# Patient Record
Sex: Male | Born: 1945 | Race: White | Hispanic: No | Marital: Single | Smoking: Former smoker
Health system: Southern US, Community
[De-identification: ages and names within clinical notes are randomized; demographics above are authoritative.]

## PROBLEM LIST (undated history)

## (undated) DIAGNOSIS — E119 Type 2 diabetes mellitus without complications: Secondary | ICD-10-CM

## (undated) DIAGNOSIS — F4310 Post-traumatic stress disorder, unspecified: Secondary | ICD-10-CM

## (undated) DIAGNOSIS — F209 Schizophrenia, unspecified: Secondary | ICD-10-CM

## (undated) DIAGNOSIS — J690 Pneumonitis due to inhalation of food and vomit: Secondary | ICD-10-CM

## (undated) DIAGNOSIS — F1010 Alcohol abuse, uncomplicated: Secondary | ICD-10-CM

## (undated) DIAGNOSIS — J449 Chronic obstructive pulmonary disease, unspecified: Secondary | ICD-10-CM

## (undated) DIAGNOSIS — N179 Acute kidney failure, unspecified: Secondary | ICD-10-CM

## (undated) DIAGNOSIS — F3181 Bipolar II disorder: Secondary | ICD-10-CM

## (undated) DIAGNOSIS — I4892 Unspecified atrial flutter: Secondary | ICD-10-CM

## (undated) DIAGNOSIS — E669 Obesity, unspecified: Secondary | ICD-10-CM

## (undated) DIAGNOSIS — E7800 Pure hypercholesterolemia, unspecified: Secondary | ICD-10-CM

## (undated) DIAGNOSIS — D649 Anemia, unspecified: Secondary | ICD-10-CM

## (undated) DIAGNOSIS — I10 Essential (primary) hypertension: Secondary | ICD-10-CM

## (undated) HISTORY — PX: OTHER SURGICAL HISTORY: SHX169

---

## 2001-01-19 ENCOUNTER — Encounter: Admission: RE | Admit: 2001-01-19 | Discharge: 2001-01-19 | Payer: Self-pay | Admitting: Internal Medicine

## 2001-01-24 ENCOUNTER — Ambulatory Visit (HOSPITAL_COMMUNITY): Admission: RE | Admit: 2001-01-24 | Discharge: 2001-01-24 | Payer: Self-pay | Admitting: *Deleted

## 2001-02-02 ENCOUNTER — Encounter: Admission: RE | Admit: 2001-02-02 | Discharge: 2001-02-02 | Payer: Self-pay | Admitting: Internal Medicine

## 2001-02-22 ENCOUNTER — Encounter: Admission: RE | Admit: 2001-02-22 | Discharge: 2001-02-22 | Payer: Self-pay | Admitting: Internal Medicine

## 2001-02-28 ENCOUNTER — Ambulatory Visit (HOSPITAL_COMMUNITY): Admission: RE | Admit: 2001-02-28 | Discharge: 2001-02-28 | Payer: Self-pay | Admitting: *Deleted

## 2001-03-26 ENCOUNTER — Encounter: Admission: RE | Admit: 2001-03-26 | Discharge: 2001-03-26 | Payer: Self-pay | Admitting: Internal Medicine

## 2001-04-01 ENCOUNTER — Ambulatory Visit (HOSPITAL_COMMUNITY): Admission: RE | Admit: 2001-04-01 | Discharge: 2001-04-01 | Payer: Self-pay

## 2006-12-29 ENCOUNTER — Emergency Department (HOSPITAL_COMMUNITY): Admission: EM | Admit: 2006-12-29 | Discharge: 2006-12-29 | Payer: Self-pay | Admitting: Family Medicine

## 2007-03-02 ENCOUNTER — Encounter: Admission: RE | Admit: 2007-03-02 | Discharge: 2007-03-02 | Payer: Self-pay | Admitting: Unknown Physician Specialty

## 2008-01-28 ENCOUNTER — Emergency Department (HOSPITAL_COMMUNITY): Admission: EM | Admit: 2008-01-28 | Discharge: 2008-01-28 | Payer: Self-pay | Admitting: Emergency Medicine

## 2011-10-27 ENCOUNTER — Emergency Department (HOSPITAL_COMMUNITY)
Admission: EM | Admit: 2011-10-27 | Discharge: 2011-11-05 | Disposition: A | Discharge status: Other Acute Inpatient Hospital | Payer: Self-pay | Attending: Emergency Medicine | Admitting: Emergency Medicine

## 2011-10-27 DIAGNOSIS — R4589 Other symptoms and signs involving emotional state: Secondary | ICD-10-CM | POA: Insufficient documentation

## 2011-10-27 DIAGNOSIS — IMO0002 Reserved for concepts with insufficient information to code with codable children: Secondary | ICD-10-CM | POA: Insufficient documentation

## 2011-10-27 DIAGNOSIS — F22 Delusional disorders: Secondary | ICD-10-CM | POA: Insufficient documentation

## 2011-10-27 DIAGNOSIS — L97509 Non-pressure chronic ulcer of other part of unspecified foot with unspecified severity: Secondary | ICD-10-CM | POA: Insufficient documentation

## 2011-10-27 DIAGNOSIS — R451 Restlessness and agitation: Secondary | ICD-10-CM

## 2011-10-27 DIAGNOSIS — E1169 Type 2 diabetes mellitus with other specified complication: Secondary | ICD-10-CM | POA: Insufficient documentation

## 2011-10-27 DIAGNOSIS — F29 Unspecified psychosis not due to a substance or known physiological condition: Secondary | ICD-10-CM | POA: Insufficient documentation

## 2011-10-27 LAB — CBC
Hemoglobin: 11.2 g/dL — ABNORMAL LOW (ref 13.0–17.0)
MCH: 33 pg (ref 26.0–34.0)
Platelets: 203 10*3/uL (ref 150–400)
RBC: 3.39 MIL/uL — ABNORMAL LOW (ref 4.22–5.81)

## 2011-10-27 LAB — COMPREHENSIVE METABOLIC PANEL
ALT: 19 U/L (ref 0–53)
AST: 20 U/L (ref 0–37)
Alkaline Phosphatase: 74 U/L (ref 39–117)
CO2: 24 mEq/L (ref 19–32)
Calcium: 9.4 mg/dL (ref 8.4–10.5)
GFR calc non Af Amer: >90 mL/min (ref >90)
Potassium: 4.3 mEq/L (ref 3.5–5.1)
Sodium: 139 mEq/L (ref 135–145)
Total Protein: 6.9 g/dL (ref 6.0–8.3)

## 2011-10-27 LAB — GLUCOSE, CAPILLARY: Glucose-Capillary: 125 mg/dL — ABNORMAL HIGH (ref 70–99)

## 2011-10-27 MED ORDER — ZIPRASIDONE MESYLATE 20 MG IM SOLR
10.0000 mg | Freq: Once | INTRAMUSCULAR | Status: AC
  Administered 2011-10-27: 10 mg via INTRAMUSCULAR
  Filled 2011-10-27: qty 20

## 2011-10-27 MED ORDER — ONDANSETRON HCL 4 MG PO TABS: 4.0000 mg | ORAL_TABLET | Freq: Three times a day (TID) | ORAL | Status: DC | PRN

## 2011-10-27 MED ORDER — IBUPROFEN 600 MG PO TABS: 600.0000 mg | ORAL_TABLET | Freq: Three times a day (TID) | ORAL | Status: DC | PRN

## 2011-10-27 MED ORDER — NICOTINE 21 MG/24HR TD PT24
21.0000 mg | MEDICATED_PATCH | Freq: Every day | TRANSDERMAL | Status: DC
  Filled 2011-10-27: qty 1

## 2011-10-27 MED ORDER — ZOLPIDEM TARTRATE 5 MG PO TABS
5.0000 mg | ORAL_TABLET | Freq: Every evening | ORAL | Status: DC | PRN
  Administered 2011-10-31: 5 mg via ORAL
  Filled 2011-10-27: qty 1

## 2011-10-27 MED ORDER — LORAZEPAM 1 MG PO TABS
1.0000 mg | ORAL_TABLET | Freq: Three times a day (TID) | ORAL | Status: DC | PRN
  Administered 2011-10-29 – 2011-10-31 (×5): 1 mg via ORAL
  Filled 2011-10-27 (×5): qty 1

## 2011-10-27 MED ORDER — ACETAMINOPHEN 325 MG PO TABS
650.0000 mg | ORAL_TABLET | ORAL | Status: DC | PRN
  Administered 2011-11-01: 650 mg via ORAL
  Filled 2011-10-27: qty 2

## 2011-10-27 MED ORDER — ALUM & MAG HYDROXIDE-SIMETH 200-200-20 MG/5ML PO SUSP: 30.0000 mL | ORAL | Status: DC | PRN

## 2011-10-27 NOTE — ED Notes (Signed)
belongings are with pt's son, they are bagged and will be taken back home. Not with the pt, not locked Pt's son left a contact number [name] Khokhar [phone] Daughter [name] [phone] Daughter in [place] Colder [phone]

## 2011-10-27 NOTE — ED Provider Notes (Addendum)
History     CSN: [id number]  Arrival date & time 10/27/11  1823   First MD Initiated Contact with Patient 10/27/11 1910      Chief Complaint  Patient presents with  . Medical Clearance  . Delusional    (Consider location/radiation/quality/duration/timing/severity/associated sxs/prior treatment) The history is provided by the patient. The history is limited by a developmental delay.   Patient here with agitated behavior and bizarre thoughts according to his IVC paperwork. Patient would not cooperate with the exam currently. According to the report, patient is having paranoid delusions. No reported suicidal or homicidal ideations. No medications given prior to arrival. No further history can be obtained No past medical history on file.  No past surgical history on file.  No family history on file.  History  Substance Use Topics  . Smoking status: Not on file  . Smokeless tobacco: Not on file  . Alcohol Use: Not on file      Review of Systems  Unable to perform ROS All other systems reviewed and are negative.    Allergies  Review of patient's allergies indicates no known allergies.  Home Medications  No current outpatient prescriptions on file.  There were no vitals taken for this visit.  Physical Exam  Nursing note and vitals reviewed. Constitutional: He appears well-developed and well-nourished.  Non-toxic appearance. No distress.  HENT:  Head: Normocephalic and atraumatic.  Eyes: Conjunctivae, EOM and lids are normal. Pupils are equal, round, and reactive to light.  Neck: Normal range of motion. Neck supple. No tracheal deviation present. No mass present.  Cardiovascular: Normal rate, regular rhythm and normal heart sounds.  Exam reveals no gallop.   No murmur heard. Pulmonary/Chest: Effort normal and breath sounds normal. No stridor. No respiratory distress. He has no decreased breath sounds. He has no wheezes. He has no rhonchi. He has no rales.  Abdominal:  Soft. Normal appearance and bowel sounds are normal. He exhibits no distension. There is no tenderness. There is no rebound and no CVA tenderness.  Musculoskeletal: Normal range of motion. He exhibits no edema and no tenderness.  Neurological: He is alert. He has normal strength. No cranial nerve deficit or sensory deficit. GCS eye subscore is 4. GCS verbal subscore is 5. GCS motor subscore is 6.  Skin: Skin is warm and dry. No abrasion and no rash noted.  Psychiatric: His affect is blunt. He is withdrawn. Thought content is paranoid and delusional.    ED Course  Procedures (including critical care time)   Labs Reviewed  CBC  COMPREHENSIVE METABOLIC PANEL  ETHANOL  URINE RAPID DRUG SCREEN (HOSP PERFORMED)   No results found.   No diagnosis found.    MDM  Patient given Geodon due to his agitation. He is now alert more cooperative. He is under IVC paperwork. Spoke with behavior health assessment team and they will see patient        Toy Baker, MD 10/27/11 2249  8:27 AM Pt with worsening psychosis, given geodon, rechecked and pt now more calm-pt had been cleared by psych, but in light of his agitation, he will need placement  Toy Baker, MD 11/04/11 [id number]

## 2011-10-27 NOTE — ED Notes (Signed)
Unable to obtain Hx from pt. [employer] to call pt's girlfriend to obtain Hx and meds.

## 2011-10-27 NOTE — ED Notes (Signed)
Pt picked up at his exgirlfriends house today by [employer]. Ex did not want him there. She reports pt is diabetic and has not taken his medication since yesterday. Also reports delusions. 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 Pt denies SI/HI. When asked about situation, pt sts he will not talk about it, points to officers and sts "they brought me here, they can tell you." Pt asked about medical Hx as [employer] cannot provide this and pt sts he is not talking anymore and puts his hand up at RN. No longer answering any questions. [employer] outside conference room.

## 2011-10-27 NOTE — ED Notes (Signed)
MD Allen at bedside.

## 2011-10-27 NOTE — ED Notes (Addendum)
Family at bedside. Pt reminded of a need for urine sample. Pt denies and refuse to give urine. MD aware of it.

## 2011-10-28 ENCOUNTER — Encounter (HOSPITAL_COMMUNITY): Payer: Self-pay | Admitting: *Deleted

## 2011-10-28 LAB — RAPID URINE DRUG SCREEN, HOSP PERFORMED
Barbiturates: NOT DETECTED
Benzodiazepines: NOT DETECTED
Cocaine: NOT DETECTED
Tetrahydrocannabinol: POSITIVE — AB

## 2011-10-28 MED ORDER — CITALOPRAM HYDROBROMIDE 20 MG PO TABS
20.0000 mg | ORAL_TABLET | Freq: Every day | ORAL | Status: DC
  Administered 2011-10-28 – 2011-11-05 (×8): 20 mg via ORAL
  Filled 2011-10-28 (×9): qty 1

## 2011-10-28 MED ORDER — ZIPRASIDONE MESYLATE 20 MG IM SOLR
10.0000 mg | Freq: Once | INTRAMUSCULAR | Status: AC
  Administered 2011-10-28: 10 mg via INTRAMUSCULAR
  Filled 2011-10-28: qty 20

## 2011-10-28 MED ORDER — HYDROCHLOROTHIAZIDE 25 MG PO TABS
25.0000 mg | ORAL_TABLET | Freq: Every day | ORAL | Status: DC
  Administered 2011-10-28 – 2011-11-05 (×7): 25 mg via ORAL
  Filled 2011-10-28 (×9): qty 1

## 2011-10-28 MED ORDER — SIMVASTATIN 20 MG PO TABS
20.0000 mg | ORAL_TABLET | Freq: Every day | ORAL | Status: DC
  Administered 2011-10-28 – 2011-11-04 (×7): 20 mg via ORAL
  Filled 2011-10-28 (×9): qty 1

## 2011-10-28 MED ORDER — METFORMIN HCL 500 MG PO TABS
1000.0000 mg | ORAL_TABLET | Freq: Two times a day (BID) | ORAL | Status: DC
  Administered 2011-10-28 – 2011-10-29 (×2): 1000 mg via ORAL
  Filled 2011-10-28 (×3): qty 2

## 2011-10-28 MED ORDER — LISINOPRIL 20 MG PO TABS
20.0000 mg | ORAL_TABLET | Freq: Every day | ORAL | Status: DC
  Administered 2011-10-28 – 2011-11-05 (×7): 20 mg via ORAL
  Filled 2011-10-28 (×9): qty 1

## 2011-10-28 MED ORDER — LISINOPRIL-HYDROCHLOROTHIAZIDE 20-25 MG PO TABS: 1.0000 | ORAL_TABLET | Freq: Every day | ORAL | Status: DC
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
## 2011-10-28 NOTE — ED Notes (Signed)
Pt's son called to check up on him he was advised by this recorder of status pt asleep resting quietly at this time

## 2011-10-28 NOTE — ED Provider Notes (Signed)
Pt seen and evaluated in psych ED.  He is under IVC, received Geodon last night. Telepsych has been obtained and recommends inpatient admission.  Ethelda Chick, MD 10/28/11 1051

## 2011-10-29 LAB — GLUCOSE, CAPILLARY: Glucose-Capillary: 54 mg/dL — ABNORMAL LOW (ref 70–99)

## 2011-10-29 MED ORDER — CETAPHIL MOISTURIZING EX LOTN
TOPICAL_LOTION | Freq: Every day | CUTANEOUS | Status: DC
  Filled 2011-10-29: qty 473

## 2011-10-29 MED ORDER — ZIPRASIDONE MESYLATE 20 MG IM SOLR
INTRAMUSCULAR | Status: AC
  Administered 2011-10-29: 10 mg via INTRAMUSCULAR
  Filled 2011-10-29: qty 20

## 2011-10-29 MED ORDER — METFORMIN HCL 500 MG PO TABS
500.0000 mg | ORAL_TABLET | Freq: Two times a day (BID) | ORAL | Status: DC
  Administered 2011-10-29 – 2011-11-05 (×12): 500 mg via ORAL
  Filled 2011-10-29 (×15): qty 1

## 2011-10-29 MED ORDER — GLUCOSE 40 % PO GEL
1.0000 | Freq: Once | ORAL | Status: DC
Start: 2011-10-29 — End: 2011-10-29
  Administered 2011-10-29: 37.5 g via ORAL

## 2011-10-29 MED ORDER — ZIPRASIDONE MESYLATE 20 MG IM SOLR
10.0000 mg | Freq: Once | INTRAMUSCULAR | Status: AC
  Administered 2011-10-29 (×2): 10 mg via INTRAMUSCULAR

## 2011-10-29 MED ORDER — GLUCOSE 40 % PO GEL
1.0000 | Freq: Once | ORAL | Status: DC
  Filled 2011-10-29: qty 1.65

## 2011-10-29 MED ORDER — CETAPHIL MOISTURIZING EX CREA
TOPICAL_CREAM | Freq: Every day | CUTANEOUS | Status: DC
  Administered 2011-10-29 – 2011-11-05 (×5): via TOPICAL
  Filled 2011-10-29 (×4): qty 1

## 2011-10-29 MED ORDER — LORAZEPAM 2 MG/ML IJ SOLN: 2.0000 mg | Freq: Once | INTRAMUSCULAR | Status: DC

## 2011-10-29 MED ORDER — ZIPRASIDONE MESYLATE 20 MG IM SOLR
10.0000 mg | Freq: Once | INTRAMUSCULAR | Status: AC
  Administered 2011-10-29: 10 mg via INTRAMUSCULAR
  Filled 2011-10-29: qty 20
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
## 2011-10-29 NOTE — ED Provider Notes (Addendum)
  Physical Exam  BP 166/89  Pulse 88  Temp(Src) 98.3 F (36.8 C) (Oral)  Resp 16  SpO2 98%  Physical Exam    ED Course  Procedures  MDM Patient is complaining about cracks in his foot. He was examined and he does have to try cracks on his left lateral foot. There is no sign of infection. He states he just wants to leave. He states that the doctor in a box told him that he can leave. [facility] psych consult was reviewed and it states that he needs inpatient treatment. We'll continue to try and find placement      [employer]. Rubin Payor, MD 10/29/11 [phone]  Patient has been somewhat aggitated and wants to leave. He has already been IVC. Will give geodon   [employer]. Rubin Payor, MD 10/29/11 1007  Patient has become more aggitated and threatening. He will be restrained, both physically and [employer]  R. Rubin Payor, MD 10/29/11 1018  Patient was found to be hypoglycemic. He was given glucose gel. I will adjust the Glucophage down.   Juliet Rude. Rubin Payor, MD 10/29/11 1349
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
## 2011-10-30 LAB — GLUCOSE, CAPILLARY: Glucose-Capillary: 106 mg/dL — ABNORMAL HIGH (ref 70–99)

## 2011-10-30 MED ORDER — ZIPRASIDONE HCL 20 MG PO CAPS
20.0000 mg | ORAL_CAPSULE | Freq: Every morning | ORAL | Status: DC
  Administered 2011-10-31 – 2011-11-02 (×3): 20 mg via ORAL
  Filled 2011-10-30 (×3): qty 1

## 2011-10-30 MED ORDER — ZIPRASIDONE MESYLATE 20 MG IM SOLR
10.0000 mg | Freq: Once | INTRAMUSCULAR | Status: AC
  Administered 2011-10-30: 10 mg via INTRAMUSCULAR

## 2011-10-30 MED ORDER — ZIPRASIDONE MESYLATE 20 MG IM SOLR
20.0000 mg | Freq: Once | INTRAMUSCULAR | Status: DC
  Filled 2011-10-30: qty 20
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
## 2011-10-30 NOTE — ED Notes (Signed)
Pt family expressed concerns about his feet being swollen red cracked with a few open areas and one black area; pt reports Cetaphil lotion ordered yesterday not helping. EDP notified; stated he'll come look at his feet.
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
## 2011-10-30 NOTE — ED Notes (Signed)
Pt eating his lunch; he declined having it warmed or a replacement ice cream. Pt is being nice and polite.
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
## 2011-10-31 ENCOUNTER — Encounter (HOSPITAL_COMMUNITY): Payer: Self-pay | Admitting: Emergency Medicine

## 2011-10-31 LAB — GLUCOSE, CAPILLARY: Glucose-Capillary: 118 mg/dL — ABNORMAL HIGH (ref 70–99)

## 2011-10-31 NOTE — Discharge Planning (Signed)
CSW spoke with patient's daughter in law Azaan Leask) regarding disposition. Vernona Rieger reports that patient is hesitant about going to [facility] and would prefer to go to [facility].  Explained to patient's daughter in law that his information has been sent to both locations and if he is offered a bed at [facility], he will go there, however, if [facility] has bed availability, he will go there first.  Expressed understanding of concern daughter in law has and explained that patient is unable to remain here, as this is an Emergency Department and used to provide short term stabilization, not long term treatment which is what patient is in need of. Patient's daughter in law appreciative of the information and reported she will share with her spouse as patient called him with concerns.  CSW will continue to pursue psych inpt facility for patient.  Ileene Hutchinson , MSW, LCSWA 10/31/2011 10:30 AM [phone]

## 2011-10-31 NOTE — ED Notes (Signed)
Pt is currently pending disposition from [facility] and [place].  Pt information has been sent to [facility]. Phone referral has been complete. Oncoming CSW/ACT staff will need to follow up with [facility] to confirm authorization number.

## 2011-10-31 NOTE — ED Notes (Addendum)
EDP looked at pt's feet, states it's a chronic issue and we're doing all we can here, pt said that his feet are fine; pt's right foot didn't look as red and swollen compared to yesterday. Pt refuses to keep feet elevated. Pt would probably benefit from being allowed to wear non restrictive slippers as opposed to restrictive hospital socks if the exception can be made.

## 2011-10-31 NOTE — B[facility] Counselor (Signed)
SW-Kerri  completing [facility] paperwork. Pt pending [facility] acceptance to their wait list.

## 2011-10-31 NOTE — Discharge Planning (Signed)
CSW spoke with Alejandro Mulling, patient transfer coordinator at [facility]. Okey Regal reports that patient is not in their system and may be a patient of the [facility].  CSW explained that family reported patient has recvd care at [facility], however, Okey Regal reported that information was not reflected in their system.  Patient's information has been sent to [facility] as well.  Ileene Hutchinson , MSW, LCSWA 10/31/2011 10:33 AM [phone]

## 2011-10-31 NOTE — ED Notes (Signed)
Son in to visit. Patient ambulated to bathroom without difficulty.

## 2011-10-31 NOTE — ED Notes (Signed)
SW tried to have pt sign paperwork to go to the [place] and he refused, started getting agitated again, talking about the veteran friends to him, about 2000 people coming here, about the [place] hospital doing shock treatment, pilling him up and locking him up, would you like it there? SW said she'll have MD sign paperwork; pt called his son to say we are moving him to the [place] in [place] as this is a stressor for him; pt obviously agitated; family called back to say that pt is willing to go to the [facility] because they were good to him there; consent is signed, Quy Lotts asked that SW be notified and call her; SW notified, stated his information is being sent everywhere possible and wherever a bed is found is where he will go for further treatment.

## 2011-10-31 NOTE — ED Provider Notes (Signed)
7:52 AM  Psychiatric evaluation is ongoing. Disposition is currently pending. Patient has no current complaints and remains stable at this time.  L foot examined at request of nurse.  Chronic diabetic foot ulcer.  Does not appear cellulitic. Normal pulses.  No warmth.     Will need ongoing daily wound care.    Evalise Abruzzese A. Patrica Duel, MD 10/31/11 [phone]

## 2011-10-31 NOTE — ED Notes (Addendum)
Pt with labile mood and delusional behavior, paranoid about staff and accusing them of wanting him to die, pt reported he had not eaten dinner so he was given a snack and something to drink. Pt back out of room approx 15 min later requesting more to eat reporting that he has to eat every two hours to avoid going into a diabetic coma. Pt advised that he would not be able to eat every two hours tonight. Pt then requested to call his wife to see if he can get the proper medications he needed because he was not getting them here. Pt advised that the phone times have ended and that he could call his wife in the am. Pt gets angry, is not accepting any response from any staff, threatens to call and report writer to his wife in the am. Pt continues to be advised that he is diabetic and he will not be able to eat every two hours as requested. Pt then back into room standing in door window looking out at staff. Pt later out of room and asked writer if he could use the bathroom, pt advised yes and that he did not have to ask to go to the bathroom that he could just go, pt asked writer in angry tone of voice "are you sure, I have to ask for everything else", pt then back into his room to stand in window of door looking out at staff.

## 2011-11-01 LAB — GLUCOSE, CAPILLARY
Glucose-Capillary: 97 mg/dL (ref 70–99)
Glucose-Capillary: 103 mg/dL — ABNORMAL HIGH (ref 70–99)

## 2011-11-01 NOTE — [facility] Counselor (Signed)
Pt referred [facility] and accepted to the waitlist per [facility] 11/01/11 @ 1105.  Assessment updated today 11/01/11.

## 2011-11-01 NOTE — ED Notes (Signed)
Pt behavior cooperative, demeanor pleasant thus far, continues with disorganized thought processes and paranoid behaviors.

## 2011-11-01 NOTE — ED Notes (Signed)
Pt has received his [facility] authorization number and [facility] has been notified. Authorization number: [id number] G for 11/01/11 to 11/07/11.

## 2011-11-01 NOTE — Discharge Planning (Signed)
CSW spoke with Joey with the [facility] who advised patient is not in their system. CSW called Vernona Rieger, patient's daughter in law, and advised her that the [facility] does not have a record of him being treated there, and she became upset, reporting that her spouse, patient's son and his sister remember vividly going to see patient at the [place] following a SI attempt. CSW asked if he was possibly at [facility] or [facility]. Patient's daughter advised she did not know.  CSW called Junious Dresser at [facility] who advised patient has not been seen at [facility]. Patient is on [facility] waiting list. Patient's mood has improved. Patient will be telepsyched to reassess disposition.  Ileene Hutchinson , MSW, LCSWA 11/01/2011 12:28 PM [phone]
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
## 2011-11-01 NOTE — ED Notes (Signed)
Up to the bathroom 

## 2011-11-01 NOTE — ED Notes (Signed)
Up to the desk on the phone-pt remains pleasent, cooperative, smiling

## 2011-11-01 NOTE — ED Notes (Signed)
Care assumed

## 2011-11-01 NOTE — ED Notes (Signed)
Pt up OOB to bathroom with supplies to take a shower, pt informed that he can not take a shower until the am, pt gets upset and starts to yell and curse and ask why. Pt advised of rules of unit and pt continues to remain upset, yell and be sarcastic.

## 2011-11-01 NOTE — ED Notes (Signed)
Up to the desk for coffee.  Pt pleasant, cooperative, smiling./joking

## 2011-11-02 LAB — GLUCOSE, CAPILLARY: Glucose-Capillary: 81 mg/dL (ref 70–99)

## 2011-11-02 NOTE — ED Notes (Signed)
Supplies given to take shower

## 2011-11-02 NOTE — ED Notes (Signed)
Care assumed

## 2011-11-02 NOTE — ED Provider Notes (Signed)
Filed Vitals:   11/02/11 0539  BP: 135/88  Pulse: 77  Temp: 98 F (36.7 C)  Resp: 18   Patient still awaiting final disposition. No acute issues at this time.  Cyndra Numbers, MD 11/02/11 [phone]

## 2011-11-02 NOTE — ED Notes (Signed)
Patient up at nursing station. When this writer asked pt if he needed help patient stated "Yes, yes I do." Pt points at RN, Velna Hatchet and states "she told me I've only been here a day. My bracelet states different. I've been here two weeks. Long enough to grow a beard." This Clinical research associate chuckled and stated he had been here longer than a day. Patient then states "It's not funny. I've been here two weeks. This is how I get treated at the [place]. Did you have fun talking to your daughter?" This Clinical research associate redirected patient back to his room.

## 2011-11-02 NOTE — ED Notes (Signed)
Spoke w/daughter-in-law on phone (have signed consent on file), upset over how patient spoke to her husband (patients son), wants to come see him tonite and asked about meds if any had changed, went over meds w/daughter-in-law. She is also worried about his foot which has a black spot on it. Informed her this nurse would look at his foot an get MD to check it today as well. Daughter-in-law thanked this nurse for talking to her and giving support.

## 2011-11-02 NOTE — ED Notes (Signed)
If any information or history is needed, contact Marcie Bal.  This is a friend in [place].  [phone]
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
## 2011-11-02 NOTE — BH Assessment (Signed)
This assessment/evaluation/note was completed by a Child psychotherapist of Social Work Warden/ranger and as Investment banker, corporate, I was immediately available for consultation/collaboration. I am in agreement with the contents and disposition reflected in the assessment/evaluation/note(s).   Ileene Hutchinson , MSW, LCSWA 11/02/2011 3:40 PM [phone]

## 2011-11-02 NOTE — ED Notes (Signed)
Pt up OOB and out of room attempting to take a shower, pt advised he has to take a shower in the am, pt then gets upset same as last night, and ask "well can I take a piss and a do #2" pt advised that yes he can and he does not have to ask, pt continues to be upset and saying "I have to ask everything else". Pt goes into bathroom and then back to room.

## 2011-11-02 NOTE — ED Notes (Signed)
Patient on phone w/son talking when he started to escalate and he became agitated, went into room w/phone, asked patient to come out of room and he handed this nurse the phone, asked if he were finished w/ the phone and he stated yes. Went into room and laid down on bed, asked if he needed anything and he stated no smiling.

## 2011-11-03 MED ORDER — ZIPRASIDONE MESYLATE 20 MG IM SOLR
INTRAMUSCULAR | Status: AC
  Filled 2011-11-03: qty 20

## 2011-11-03 MED ORDER — ZIPRASIDONE MESYLATE 20 MG IM SOLR
10.0000 mg | Freq: Once | INTRAMUSCULAR | Status: AC
  Administered 2011-11-03: 10 mg via INTRAMUSCULAR

## 2011-11-03 NOTE — ED Provider Notes (Signed)
Initially, the patient had no complaints and said he was doing well. After that, he started pursuing me asking me if I knew how long he had been there and why was he is still being kept in our psychiatric unit. He is on the waiting list at [facility].  Dione Booze, MD 11/03/11 [phone]

## 2011-11-03 NOTE — Discharge Planning (Signed)
CSW faxed patient's information to [facility] for possible disposition.  Ileene Hutchinson , MSW, LCSWA 11/03/2011 1:09 PM [phone]

## 2011-11-03 NOTE — BH Assessment (Signed)
This assessment/evaluation/note was completed by a Child psychotherapist of Social Work Warden/ranger and as Investment banker, corporate, I was immediately available for consultation/collaboration. I am in agreement with the contents and disposition reflected in the assessment/evaluation/note(s).   Ileene Hutchinson , MSW, LCSWA 11/03/2011 3:04 PM [phone]

## 2011-11-03 NOTE — ED Notes (Signed)
Came out to NS and asked to use phone, informed he had already made 2 calls and this would be his last call for today (total of 3 calls per day), looked at the phone and decided not to make the call, went back to room, came back to NS and asked how many times would he be able to go to the BR, he was informed he could go as many times as he needed to, shook his head and mumbled something to himself, when he came out of BR stated that he would be informing his lawyer of this, stated he wanted to talk to his doctor, his doc came in to see him this morning and asked him if he had any questions, he stated yes and the doc stated he would be back to see him, informed him if doc came back again today we would tell him (call to MD to notify him to come speak w/patient) no specific reason/concern , patient went back to his room and stated "see, I'm inside my door". Closed the door to his room and was seen thru the camera watching the NS thru his door window.

## 2011-11-03 NOTE — ED Notes (Signed)
NT asked patient if she could do CBG. Pt stared at NT with no verbal response. No CBG completed at this time.

## 2011-11-03 NOTE — ED Provider Notes (Signed)
Pt standing in hall, agitated. Requests clothes to leave. Appears that delusional thought processes continue, very little insight into current symptoms, is under ivc. Previous telepsych consult recommended inpt tx. As prior psych consult several days ago, and feel pt may benefit from re-eval, possible additional psychiatric medication while awaiting placement, repeat telepsych consult ordered. For agitation, reassured, inquired as to we could get him anything to make his stay more pleasant. Remains agitated, uncooperative. geodon im for symptom improvement. Signed out to Dr Anitra Lauth to follow up on telepsych consult once completed.   Suzi Roots, MD 11/03/11 [phone]

## 2011-11-03 NOTE — ED Notes (Signed)
Asked pt if he wanted his lunch; he responded "NO!"

## 2011-11-03 NOTE — ED Notes (Signed)
PATIENT REFUSING VITALS AT THIS TIME.

## 2011-11-03 NOTE — ED Notes (Signed)
PATIENT STILL REFUSING VITALS

## 2011-11-03 NOTE — ED Notes (Signed)
When pt asked if he would like a shower pt stated "No I'm not gonna be here much longer. You'll see. You know why I'm here."

## 2011-11-03 NOTE — ED Notes (Signed)
Patient up to restroom brushing teeth.

## 2011-11-03 NOTE — ED Notes (Signed)
Patient will not take any of his medications today, has not eaten breakfast or lunch, he is waiting for his attorney to arrive, states he is not going to take meds, CBG or eat until his attorney tells him it is ok to do so tried to encourage him several times to take meds or eat but to no avail
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
## 2011-11-03 NOTE — Discharge Planning (Signed)
CSW spoke with staff at [facility] who advised they did receive referral information on patient and it is being reviewed.  Ileene Hutchinson , MSW, LCSWA 11/03/2011 3:05 PM [phone]

## 2011-11-03 NOTE — ED Notes (Signed)
Patient refused breakfast tray and continues to stand at door and stare into nurses station.

## 2011-11-03 NOTE — ED Notes (Signed)
Pt refused CBG. Stated "I need to see my lawyer".

## 2011-11-03 NOTE — ED Notes (Signed)
Continues to sleep from injection earlier, respirations regular and non labored, patient is safe

## 2011-11-04 LAB — GLUCOSE, CAPILLARY: Glucose-Capillary: 73 mg/dL (ref 70–99)

## 2011-11-04 MED ORDER — ZIPRASIDONE MESYLATE 20 MG IM SOLR
20.0000 mg | Freq: Once | INTRAMUSCULAR | Status: AC
  Administered 2011-11-04: 20 mg via INTRAMUSCULAR

## 2011-11-04 MED ORDER — ZIPRASIDONE MESYLATE 20 MG IM SOLR
INTRAMUSCULAR | Status: AC
  Filled 2011-11-04: qty 20

## 2011-11-04 NOTE — ED Notes (Addendum)
Pt has been accepted to [facility] by Osker Mason. Pt's bed is being held for transport on 11/05/11. Pt's updated legal information has been faxed for review. Oncoming ACT staff to follow up with pt to ensure pt can be transported and contact pt's family to update them on disposition as well.   CSW has contaced Gretel Acre, pts daughter-in-law to explain the updated disposition.
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
## 2011-11-04 NOTE — ED Notes (Signed)
TC with Roseanne Reno @ [facility]. Confirmed pt is on wait list. Stated they would eventually need new legals.Confirmed that legals were updated 11/03/11.

## 2011-11-04 NOTE — ED Notes (Signed)
Is currently sleeping, voicing no complaints

## 2011-11-04 NOTE — ED Notes (Signed)
Lunch meal served 

## 2011-11-04 NOTE — Discharge Planning (Signed)
CSW was informed by nurse that patient was cleared for discharge by telepysch physician and EDP agreed with disposition, but was not aware of patient's behaviors since being here. Patient has been observed going in and out of lucidity where one moment he is appropriate and the next, he is delusional, experiences paranoia and verbally aggressive/abusive.  CSW spoke with EDP about this pattern of behavior and shared my concern that patient was seen by telepsych during a period of lucidity, however, this will change in a moment and he will return to ED with same symptoms.  EDP agreed with CSW concerns and declined recommendation for discharge by telepsych physician.  CSW contacted patient's son (patient has signed consent) and advised patient will not be discharged.  Patient's son appreciative of phone call.  Patient remains on [facility] waiting list.  Ileene Hutchinson , MSW, LCSWA 11/04/2011  7:30 AM [phone]

## 2011-11-04 NOTE — ED Notes (Signed)
CSW spoke with Larita Fife, admissions RN at [facility], who verified that pt has been added to the priority list for admission. Larita Fife anticipates pt being accepted earlier next week. CSW/ACT will to continue to follow as needed.

## 2011-11-04 NOTE — ED Provider Notes (Addendum)
Per telepsych, patient is okay for discharge.  I spoke with him this morning.  He denies suicidal ideation and wants to get home.  Will discharge, return prn.  I received call from patient's family.  They are upset that he is leaving.  I spoke with act team who knows patient.  They tell me that his psychosis seems to come and go and he had the telepsych during a time when he was more lucid.  Geoffery Lyons, MD 11/04/11 1610  Geoffery Lyons, MD 11/04/11 [phone]
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
## 2011-11-04 NOTE — Discharge Instructions (Signed)
 RESOURCE GUIDE  Dental Problems  Patients with Medicaid: [facility]                     [facility] [address]                                           [address] Phone:  [phone]                                                  Phone:  [phone]  If unable to pay or uninsured, contact:  [facility] or [facility] to become qualified for the adult dental clinic.  Chronic Pain Problems Contact [facility]  [phone] Patients need to be referred by their primary care doctor.  Insufficient Money for Medicine Contact [employer]:  call "211" or [facility] 271-5999.  No Primary Care Doctor Call [facility]  832-8000 Other agencies that provide inexpensive medical care    [facility]  832-8035    [facility]  832-7272    [facility]  [phone]    [facility] Clinic  832-4777    [facility]  [phone]    [facility]  [phone]  Psychological Services [facility]  [phone] [facility]  [phone] [facility]   800 853-5163 (emergency services 641-4993)  Substance Abuse Resources [facility]  336-882-2125 [facility] 336-784-9470 [facility] 336-285-9073 [facility] 336-845-3988 Residential & Outpatient Substance Abuse Program  800-659-3381  Abuse/Neglect [place] Child Abuse Hotline (336) 641-3795 [place] Child Abuse Hotline 800-378-5315 (After Hours)  Emergency Shelter [facility] (336) 271-5985  [facility] (336) 275-9566 [facility] (704) 372-4663  MRSA Hotline #:   832-7006    [place] [facility]  [facility] [place]     [employer]                          [facility] [address] [place]                       [address]      [address]  [place]                                                [place]                            [place] Phone:  [phone]                                   Phone:  [phone]                 Phone:  [phone]  [facility] Phone:    [phone]  [place] Child Abuse Hotline (336) 342-1394 (336) 342-3537 (After Hours)   

## 2011-11-04 NOTE — ED Notes (Signed)
Patient in front of NS upset w/staff that he is not going to go home, de escalation in progress
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
## 2011-11-05 LAB — GLUCOSE, CAPILLARY: Glucose-Capillary: 175 mg/dL — ABNORMAL HIGH (ref 70–99)

## 2011-11-05 MED ORDER — ZIPRASIDONE MESYLATE 20 MG IM SOLR
20.0000 mg | Freq: Once | INTRAMUSCULAR | Status: AC
  Administered 2011-11-05: 20 mg via INTRAMUSCULAR
  Filled 2011-11-05: qty 20

## 2011-11-05 NOTE — ED Notes (Signed)
Pt more pleasant, talking w/ security, smiling

## 2011-11-05 NOTE — ED Notes (Signed)
Dr Kohut into see 

## 2011-11-05 NOTE — ED Notes (Signed)
Up to the desk on the phone 

## 2011-11-05 NOTE — ED Notes (Signed)
Late entry--red,flat, non draining, painless area approx 1x0.5" noted on the bottom of lt great toe.  Pt unsure if it was from an injury.

## 2011-11-05 NOTE — ED Notes (Signed)
Hold 1000 geodone this am T.O.R.B Dr. Juleen China
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
## 2011-11-05 NOTE — ED Notes (Signed)
Dr Juleen China in w/ pt

## 2011-11-05 NOTE — ED Notes (Signed)
[facility] de[facility] called and will transport around 1200p

## 2011-11-05 NOTE — ED Notes (Signed)
Sitting in room eatting breakfast, remains pleasant, cooperative, smiling

## 2011-11-05 NOTE — ED Notes (Signed)
Pt declined to change scrubs on dc (scrubs were torn)--set of scrubs sent w/ pt.  Pt wearing glasses on dc, and is knows that his family is aware that he is being transferred and he will contact them when he arrives there
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
## 2011-11-05 NOTE — ED Notes (Signed)
[facility]iff dept here to transport
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
## 2012-12-21 ENCOUNTER — Inpatient Hospital Stay (HOSPITAL_COMMUNITY)
Admission: EM | Admit: 2012-12-21 | Discharge: 2012-12-25 | DRG: 641 | Disposition: A | Discharge status: Other Acute Inpatient Hospital | Payer: Medicare Other | Attending: Family Medicine | Admitting: Family Medicine

## 2012-12-21 ENCOUNTER — Encounter (HOSPITAL_COMMUNITY): Payer: Self-pay | Admitting: *Deleted

## 2012-12-21 DIAGNOSIS — E871 Hypo-osmolality and hyponatremia: Principal | ICD-10-CM | POA: Diagnosis present

## 2012-12-21 DIAGNOSIS — F22 Delusional disorders: Secondary | ICD-10-CM

## 2012-12-21 DIAGNOSIS — F209 Schizophrenia, unspecified: Secondary | ICD-10-CM | POA: Diagnosis present

## 2012-12-21 DIAGNOSIS — R569 Unspecified convulsions: Secondary | ICD-10-CM | POA: Diagnosis present

## 2012-12-21 DIAGNOSIS — F17200 Nicotine dependence, unspecified, uncomplicated: Secondary | ICD-10-CM | POA: Diagnosis present

## 2012-12-21 DIAGNOSIS — T502X5A Adverse effect of carbonic-anhydrase inhibitors, benzothiadiazides and other diuretics, initial encounter: Secondary | ICD-10-CM | POA: Diagnosis present

## 2012-12-21 DIAGNOSIS — F1210 Cannabis abuse, uncomplicated: Secondary | ICD-10-CM | POA: Diagnosis present

## 2012-12-21 DIAGNOSIS — E119 Type 2 diabetes mellitus without complications: Secondary | ICD-10-CM | POA: Diagnosis present

## 2012-12-21 DIAGNOSIS — I10 Essential (primary) hypertension: Secondary | ICD-10-CM | POA: Diagnosis present

## 2012-12-21 DIAGNOSIS — E7800 Pure hypercholesterolemia, unspecified: Secondary | ICD-10-CM | POA: Diagnosis present

## 2012-12-21 DIAGNOSIS — F319 Bipolar disorder, unspecified: Secondary | ICD-10-CM

## 2012-12-21 DIAGNOSIS — Y92009 Unspecified place in unspecified non-institutional (private) residence as the place of occurrence of the external cause: Secondary | ICD-10-CM

## 2012-12-21 DIAGNOSIS — F4310 Post-traumatic stress disorder, unspecified: Secondary | ICD-10-CM | POA: Diagnosis present

## 2012-12-21 DIAGNOSIS — F1020 Alcohol dependence, uncomplicated: Secondary | ICD-10-CM | POA: Diagnosis present

## 2012-12-21 DIAGNOSIS — F3110 Bipolar disorder, current episode manic without psychotic features, unspecified: Secondary | ICD-10-CM | POA: Diagnosis present

## 2012-12-21 DIAGNOSIS — R404 Transient alteration of awareness: Secondary | ICD-10-CM

## 2012-12-21 DIAGNOSIS — Z79899 Other long term (current) drug therapy: Secondary | ICD-10-CM

## 2012-12-21 HISTORY — DX: Pure hypercholesterolemia, unspecified: E78.00

## 2012-12-21 HISTORY — DX: Type 2 diabetes mellitus without complications: E11.9

## 2012-12-21 HISTORY — DX: Post-traumatic stress disorder, unspecified: F43.10

## 2012-12-21 HISTORY — DX: Bipolar II disorder: F31.81

## 2012-12-21 HISTORY — DX: Schizophrenia, unspecified: F20.9

## 2012-12-21 HISTORY — DX: Essential (primary) hypertension: I10

## 2012-12-21 LAB — CBC
Hemoglobin: 12 g/dL — ABNORMAL LOW (ref 13.0–17.0)
MCH: 33.5 pg (ref 26.0–34.0)
MCHC: 36.8 g/dL — ABNORMAL HIGH (ref 30.0–36.0)
MCV: 91.1 fL (ref 78.0–100.0)

## 2012-12-21 LAB — COMPREHENSIVE METABOLIC PANEL
ALT: 19 U/L (ref 0–53)
Calcium: 9.2 mg/dL (ref 8.4–10.5)
Creatinine, Ser: 0.9 mg/dL (ref 0.50–1.35)
GFR calc Af Amer: >90 mL/min (ref >90)
Glucose, Bld: 105 mg/dL — ABNORMAL HIGH (ref 70–99)
Sodium: 119 mEq/L — CL (ref 135–145)
Total Protein: 6.7 g/dL (ref 6.0–8.3)

## 2012-12-21 LAB — ETHANOL: Alcohol, Ethyl (B): <11 mg/dL (ref 0–11)

## 2012-12-21 LAB — RAPID URINE DRUG SCREEN, HOSP PERFORMED
Benzodiazepines: NOT DETECTED
Cocaine: NOT DETECTED
Opiates: NOT DETECTED

## 2012-12-21 MED ORDER — ONDANSETRON HCL 4 MG/2ML IJ SOLN: 4.0000 mg | Freq: Three times a day (TID) | INTRAMUSCULAR | Status: DC | PRN

## 2012-12-21 MED ORDER — ONDANSETRON HCL 4 MG PO TABS: 4.0000 mg | ORAL_TABLET | Freq: Four times a day (QID) | ORAL | Status: DC | PRN

## 2012-12-21 MED ORDER — SODIUM CHLORIDE 0.9 % IV SOLN
INTRAVENOUS | Status: DC
  Administered 2012-12-21: 17:00:00 via INTRAVENOUS

## 2012-12-21 MED ORDER — SODIUM CHLORIDE 0.9 % IV SOLN: INTRAVENOUS | Status: DC

## 2012-12-21 MED ORDER — ENOXAPARIN SODIUM 40 MG/0.4ML ~~LOC~~ SOLN
40.0000 mg | SUBCUTANEOUS | Status: DC
  Administered 2012-12-21 – 2012-12-24 (×4): 40 mg via SUBCUTANEOUS
  Filled 2012-12-21 (×5): qty 0.4

## 2012-12-21 MED ORDER — ONDANSETRON HCL 4 MG/2ML IJ SOLN: 4.0000 mg | Freq: Four times a day (QID) | INTRAMUSCULAR | Status: DC | PRN

## 2012-12-21 NOTE — H&P (Signed)
[facility] History and Physical  Albert Hess ZOX:[id number] DOB: 12-30-1945 DOA: 12/21/2012  Referring physician: ED physician PCP: No primary provider on file.  Chief Complaint: Altered mental status  HPI:  Pt is 67 yo male who was brought in to [facility] ED after found to be delirious and talking with not much sense. Pt is unable to provide history at this time. He is talking about God and trying to save some lives. He is unable to reports if he has been taking his home medications regularly. There has not been reports of any chest pain or shortness of breath, no other abdominal or urinary concerns.   In ED, pt continues to stay confused and on BMP sodium level found to be 119.   Assessment and Plan: Altered mental status - this could be multifactorial and secondary to hyponatremia in the setting of schizophrenic illness - will admit the pt for further evaluation - will place order for psych consult  - hold antidepressants  Hyponatremia - unclear etiology, ? Medication effect but not clear if pt taking med's - possible dehydration - encouraged PO intake, pt able to tolerate - BMP in AM  Code Status: Full Family Communication: No family at bedside  Disposition Plan: Admit to medical floor, sitter at bedside   Review of Systems:  Unable to obtain due to confusion    Past Medical History  Diagnosis Date  . Mental disorder   . PTSD (post-traumatic stress disorder)   . Bipolar 2 disorder   . Schizophrenia   . Hypertension   . High cholesterol   . Diabetes mellitus without complication     Past Surgical History  Procedure Laterality Date  . Right side      pt points to R side for surgery    Social History:  reports that he has been smoking Cigarettes.  He has been smoking about 0.00 packs per day. He does not have any smokeless tobacco history on file. He reports that  drinks alcohol. He reports that he uses illicit drugs (Marijuana).  No Known Allergies  Pt unable  to provide details on family medical history   Prior to Admission medications   Medication Sig Start Date End Date Taking? Authorizing Provider  citalopram (CELEXA) 20 MG tablet Take 20 mg by mouth daily.    Historical Provider, MD  lisinopril-hydrochlorothiazide (PRINZIDE,ZESTORETIC) 20-25 MG per tablet Take 1 tablet by mouth daily.    Historical Provider, MD  metFORMIN (GLUCOPHAGE) 500 MG tablet Take 1,000 mg by mouth 2 (two) times daily with a meal.    Historical Provider, MD  pravastatin (PRAVACHOL) 40 MG tablet Take 40 mg by mouth daily.    Historical Provider, MD    Physical Exam: Filed Vitals:   12/21/12 1258 12/21/12 1307 12/21/12 1605  BP:  157/68 174/82  Pulse:  93 88  Temp:  98.3 F (36.8 C) 98.6 F (37 C)  TempSrc:  Oral Oral  Resp:  18 20  Height: 6\' 2"  (1.88 m)    Weight: 90.719 kg (200 lb)    SpO2:  99% 99%    Physical Exam  Constitutional: Appears very confused, no following commands, not in acute distress  HENT: Normocephalic. External right and left ear normal. Oropharynx is clear and moist.  Eyes: Conjunctivae and EOM are normal. PERRLA, no scleral icterus.  Neck: Normal ROM. Neck supple. No JVD. No tracheal deviation. No thyromegaly.  CVS: RRR, S1/S2 +, no murmurs, no gallops, no carotid bruit.  Pulmonary: Effort  and breath sounds normal, no stridor, rhonchi, wheezes, rales.  Abdominal: Soft. BS +,  no distension, tenderness, rebound or guarding.  Musculoskeletal: Normal range of motion. No edema and no tenderness.  Lymphadenopathy: No lymphadenopathy noted, cervical, inguinal. Neuro: Alert but confused. Normal reflexes. No cranial nerve deficit. Skin: Skin is warm and dry. No rash noted. Not diaphoretic. No erythema. No pallor.  Psychiatric: Elated mood, tangential thoughts   Labs on Admission:  Basic Metabolic Panel:  Recent Labs Lab 12/21/12 1323  NA 119*  K 4.7  CL 86*  CO2 22  GLUCOSE 105*  BUN 21  CREATININE 0.90  CALCIUM 9.2   Liver  Function Tests:  Recent Labs Lab 12/21/12 1323  AST 31  ALT 19  ALKPHOS 53  BILITOT 0.5  PROT 6.7  ALBUMIN 4.0   CBC:  Recent Labs Lab 12/21/12 1323  WBC 4.9  HGB 12.0*  HCT 32.6*  MCV 91.1  PLT 156   CBG:  Recent Labs Lab 12/21/12 1307  GLUCAP 110*    Radiological Exams on Admission: No results found.  EKG: Normal sinus rhythm, no ST/T wave changes  Debbora Presto, MD  [facility] Pager [phone]  If 7PM-7AM, please contact night-coverage [email or web address] Password [facility] 12/21/2012, 4:35 PM

## 2012-12-21 NOTE — ED Notes (Signed)
EMS states Pt was found outside barefoot and delusional. EMS was called by bystanders in Pt apt complex.

## 2012-12-21 NOTE — ED Notes (Signed)
Bed:WA08<BR> Expected date:<BR> Expected time:<BR> Means of arrival:<BR> Comments:<BR> EMS

## 2012-12-21 NOTE — ED Notes (Signed)
Phlebotomy at bedside to collect blood.  

## 2012-12-21 NOTE — ED Notes (Signed)
Attempted to call report, floor to call back when ready. CN aware.

## 2012-12-21 NOTE — ED Notes (Signed)
MD at bedside. 

## 2012-12-21 NOTE — ED Provider Notes (Signed)
History     CSN: [id number]  Arrival date & time 12/21/12  1222   First MD Initiated Contact with Patient 12/21/12 1458      Chief Complaint  Patient presents with  . Medical Clearance    (Consider location/radiation/quality/duration/timing/severity/associated sxs/prior treatment) HPI Comments: Pt states that "they're cutting peoples arms and legs off and Shit"  States that he could have saved people but he couldn't - " I have the same powers as all of them, God, Jesus Christ, they've killed them all".  Denies taking any medicine and denies all medical problems.  Level 5 caveat applies secondary to delusions.    The history is provided by the patient.    Past Medical History  Diagnosis Date  . Mental disorder   . PTSD (post-traumatic stress disorder)   . Bipolar 2 disorder   . Schizophrenia   . Hypertension   . High cholesterol   . Diabetes mellitus without complication     Past Surgical History  Procedure Laterality Date  . Right side      pt points to R side for surgery    History reviewed. No pertinent family history.  History  Substance Use Topics  . Smoking status: Current Every Day Smoker    Types: Cigarettes  . Smokeless tobacco: Not on file  . Alcohol Use: Yes      Review of Systems  Unable to perform ROS: Psychiatric disorder    Allergies  Review of patient's allergies indicates no known allergies.  Home Medications   Current Outpatient Rx  Name  Route  Sig  Dispense  Refill  . citalopram (CELEXA) 20 MG tablet   Oral   Take 20 mg by mouth daily.         Marland Kitchen lisinopril-hydrochlorothiazide (PRINZIDE,ZESTORETIC) 20-25 MG per tablet   Oral   Take 1 tablet by mouth daily.         . metFORMIN (GLUCOPHAGE) 500 MG tablet   Oral   Take 1,000 mg by mouth 2 (two) times daily with a meal.         . pravastatin (PRAVACHOL) 40 MG tablet   Oral   Take 40 mg by mouth daily.           BP 174/82  Pulse 88  Temp(Src) 98.6 F (37 C) (Oral)   Resp 20  Ht 6\' 2"  (1.88 m)  Wt 200 lb (90.719 kg)  BMI 25.67 kg/m2  SpO2 99%  Physical Exam  Nursing note and vitals reviewed. Constitutional: He appears well-developed and well-nourished. No distress.  HENT:  Head: Normocephalic and atraumatic.  Mouth/Throat: Oropharynx is clear and moist. No oropharyngeal exudate.  Eyes: Conjunctivae and EOM are normal. Pupils are equal, round, and reactive to light. Right eye exhibits no discharge. Left eye exhibits no discharge. No scleral icterus.  Neck: Normal range of motion. Neck supple. No JVD present. No thyromegaly present.  Cardiovascular: Normal rate, regular rhythm, normal heart sounds and intact distal pulses.  Exam reveals no gallop and no friction rub.   No murmur heard. Pulmonary/Chest: Effort normal and breath sounds normal. No respiratory distress. He has no wheezes. He has no rales.  Abdominal: Soft. Bowel sounds are normal. He exhibits no distension and no mass. There is no tenderness.  Musculoskeletal: Normal range of motion. He exhibits no edema and no tenderness.  Lymphadenopathy:    He has no cervical adenopathy.  Neurological: He is alert. Coordination normal.  Skin: Skin is warm and dry. No rash  noted. No erythema.  Psychiatric:  Delusional, hallucinating - "they leave your memory, but I've been to hell and back and killed the devil - they've been after ever since".    ED Course  Procedures (including critical care time)  Labs Reviewed  GLUCOSE, CAPILLARY - Abnormal; Notable for the following:    Glucose-Capillary 110 (*)    All other components within normal limits  CBC - Abnormal; Notable for the following:    RBC 3.58 (*)    Hemoglobin 12.0 (*)    HCT 32.6 (*)    MCHC 36.8 (*)    RDW 11.4 (*)    All other components within normal limits  COMPREHENSIVE METABOLIC PANEL - Abnormal; Notable for the following:    Sodium 119 (*)    Chloride 86 (*)    Glucose, Bld 105 (*)    GFR calc non Af Amer 87 (*)    All  other components within normal limits  URINE RAPID DRUG SCREEN (HOSP PERFORMED) - Abnormal; Notable for the following:    Tetrahydrocannabinol POSITIVE (*)    All other components within normal limits  ETHANOL   No results found.   1. Hyponatremia   2. Delusions       MDM  The patient has vital signs that are unremarkable other than mild hypertension, has no fever or tachycardia, appears in and to have ongoing psychiatric issues but also has a hyponatremia that would prevent psychiatric evaluation thus we'll treat hyponatremia, discussed with hospitalist who will admit. Saline started        Vida Roller, MD 12/21/12 [phone]

## 2012-12-21 NOTE — Progress Notes (Signed)
Pt confirms pcp is Dr Janace Litten EPIC updated  CM noted cm consult for following for further services Cm spoke with pt who was noted to be alert and he informed Cm he could not do much speaking at this time.  When he did speak CM noted pt with apraxia at intervals  CM reviewed home health [facility]) (length of stay in home, types of [facility] staff available, coverage, primary caregiver, up to 24 hrs before services may be started) and left him a copy of the [place] home health agencies but CM is unable to determine how much pt understood This pt will need further assessment  Choices offered   HOME HEALTH AGENCIES SERVING [place]   Agencies that are Medicare-Certified and are affiliated with The [facility]  Telephone Number Address  [facility]   The [facility] has ownership interest in this company; however, you are under no obligation to use this agency. [phone] or  [phone] [address] [place], [place] [address] [email or web address]   Agencies that are Medicare-Certified and are not affiliated with [facility] Telephone Number Address  [facility] [phone] Fax [phone] [address] [place], [place]  [address] [email or web address]  [facility] [phone] or [phone] Fax [phone] [address] [place], [place] [address] [email or web address]  [facility] [phone] Fax [phone] [address] [place], [place] [address] [email or web address]  [facility] [phone] Fax [phone] [address] [place], [place]  [address] [email or web address]  [facility] [phone] Fax [phone] [address] Suite [place], [place]  [address] [email or web address]  [facility] [phone] [address] [place], [place] [address] [email or web address]  [facility] [phone]  [address] [place], [place] [address] [email or web address]  [facility] [phone] or [phone] Fax number [phone] [address] [place], [place]  [address] [email or web address]  [facility] [phone] Fax [phone] [address] [place], [place]  [address]  [facility]  [phone] Fax [phone] [address] [place], [place] [address] [email or web address]
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
## 2012-12-22 DIAGNOSIS — F319 Bipolar disorder, unspecified: Secondary | ICD-10-CM | POA: Diagnosis present

## 2012-12-22 DIAGNOSIS — R404 Transient alteration of awareness: Secondary | ICD-10-CM

## 2012-12-22 LAB — BASIC METABOLIC PANEL
BUN: 13 mg/dL (ref 6–23)
CO2: 23 mEq/L (ref 19–32)
Chloride: 96 mEq/L (ref 96–112)
Creatinine, Ser: 0.7 mg/dL (ref 0.50–1.35)
Potassium: 4.7 mEq/L (ref 3.5–5.1)

## 2012-12-22 LAB — CBC
HCT: 32.4 % — ABNORMAL LOW (ref 39.0–52.0)
Hemoglobin: 11.9 g/dL — ABNORMAL LOW (ref 13.0–17.0)
MCHC: 36.7 g/dL — ABNORMAL HIGH (ref 30.0–36.0)
MCV: 90.8 fL (ref 78.0–100.0)
RDW: 11.7 % (ref 11.5–15.5)

## 2012-12-22 MED ORDER — POLYVINYL ALCOHOL 1.4 % OP SOLN
1.0000 [drp] | OPHTHALMIC | Status: DC | PRN
  Filled 2012-12-22: qty 15

## 2012-12-22 MED ORDER — LORAZEPAM 2 MG/ML IJ SOLN: 2.0000 mg | Freq: Once | INTRAMUSCULAR | Status: AC | PRN

## 2012-12-22 MED ORDER — LISINOPRIL 10 MG PO TABS
10.0000 mg | ORAL_TABLET | Freq: Every day | ORAL | Status: DC
  Administered 2012-12-22 – 2012-12-25 (×4): 10 mg via ORAL
  Filled 2012-12-22 (×4): qty 1
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
## 2012-12-22 NOTE — Consult Note (Signed)
Reason for Consult:Possible seizure Referring Physician: Claiborne Billings, T  CC: Possible seizure  History is obtained from:Patient, sitter, nursing staff  HPI: Albert Hess is a 67 y.o. male who was walking about 7pm when he was seen to stop talking, then lean his head back, lean against a wall and have bilateral arm shaking and leg stiffening. He did not fall or lose consciousness. Per the sitter with him, he was much less talkative for about an hour, but seems to be back to his pre-event baseline now. He states that he remembers the whole event. He felt like his jaw "clenched" and then bloth his legs "cramped up."   He reports begin told on another occasion that he had  a seizure, but does not remember that one.   ROS: A 14 point ROS was performed and is negative except as noted in the HPI.  Past Medical History  Diagnosis Date  . Mental disorder   . PTSD (post-traumatic stress disorder)   . Bipolar 2 disorder   . Schizophrenia   . Hypertension   . High cholesterol   . Diabetes mellitus without complication     Family History: No hx sz  Social History: Tob: none Lives alone.   Exam: Current vital signs: BP 128/80  Pulse 99  Temp(Src) 99.2 F (37.3 C) (Oral)  Resp 18  Ht 6\' 2"  (1.88 m)  Wt 94.9 kg (209 lb 3.5 oz)  BMI 26.85 kg/m2  SpO2 99% Vital signs in last 24 hours: Temp:  [99.2 F (37.3 C)] 99.2 F (37.3 C) (04/05 1618) Pulse Rate:  [99] 99 (04/05 1618) Resp:  [18] 18 (04/05 1618) BP: (128)/(80) 128/80 mmHg (04/05 1618) SpO2:  [99 %] 99 % (04/05 1618)  General: in bed, NAD CV: RRR Mental Status: Patient is awake, alert, oriented to person, place, month, year, and situation. Immediate and remote memory are intact. Patient is able to give a clear and coherent history. No signs of aphasia or neglect.  Cranial Nerves: II: Visual Fields are full. Pupils are equal, round, and reactive to light.  Discs are difficult to visualize. III,IV, VI: EOMI without ptosis or  diploplia.  V: Facial sensation is symmetric to temperature VII: Facial movement is symmetric.  VIII: hearing is intact to voice X: Uvula elevates symmetrically XI: Shoulder shrug is symmetric. XII: tongue is midline without atrophy or fasciculations.  Motor: Tone is normal. Bulk is normal. 5/5 strength was present in all four extremities.  Sensory: Sensation is symmetric to light touch in the arms and legs. Deep Tendon Reflexes: 2+ and symmetric in the biceps and patellae.  Cerebellar: FNF  intact bilaterally Gait: Patient has a stable casual gait.    I have reviewed labs in epic and the results pertinent to this consultation are: hyponatremia THC+  Impression: 67 yo M with episode earlier tonight of unclear etiology. Bilateral involvement of his arms and legs coupled with preserved consciousness and maintenance of upright posture would argue strongly towards a non-epileptic nature to this event. At this time, though an EEG would be reasonable, I have a very low suspicion for seizure and would not start AED therapy at this time. 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   Recommendations: 1) EEG 2) Would not start AED at this time from neurological perspective unless his EEG was abnormal, though if his bipolar disorder would merit a mood stabilizer such as depakote or lamictal, there would be no contraindication either.    Ritta Slot, MD [facility] [phone]  If 7pm- 7am, please page neurology on call at [phone].
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
## 2012-12-22 NOTE — Progress Notes (Signed)
[facility] hospitalist progress note. Chief complaint. Seizure activity. History of present illness. 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 I came to the patient's bedside and found him alert though somewhat manic in presentation. Staff did describe a period of reduced level of consciousness consistent with postictal state. I asked the patient if he has any prior history of seizure and he stated yes but not like this. I believe he is referring to facial twitching previously. He is unable to tell me if he is ever been on any prior seizure medications there was a sitter in the room and she describes the patient standing and looking out the window when he became rigid and his head arched back. She also described drooling and a general tonic-clonic motion though the patient did not fall and remained standing during the entire incident. Staff helped him at the bed and found him verbal but reduced in his level of consciousness. I reviewed the patient's current medications and I see nothing that should be lowering the seizure threshold. Of note the patient is hyponatremic though much improved to 128 today up from prior 119 on admission. Currently the patient denies any headache or visual changes. Vital signs. Temperature 99.2, pulse 99, respiration 18, blood pressure 120/80. O2 sats 99%. General appearance. Well-developed male who is alert and cooperative but manic in his his presentation and not a very good historian currently due to flight of thought. Cardiac. Rate and rhythm regular. Lungs. Breath sounds clear and equal. Abdomen. Soft with positive bowel sounds. Neurologic. Cranial nerves 2-12 grossly intact. No unilateral or focal defects. Strength 5/5 and equal in all 4 extremities. Impression/plan. Problem #1. Seizure activity? Patient  certainly did not have a typical presentation of tonic-clonic seizure but cannot rule out more discrete seizure activity. In that the patient has no IV access and I've asked staff to place a saline lock IV. I left an order for as needed Ativan 2 mg IV for any status epilepticus event. I did contact Doctor Amada Jupiter of neurology who has kindly consented to see the patient this evening. Will defer further orders to consulting neurologist.

## 2012-12-22 NOTE — Progress Notes (Signed)
Pt voluntarily gave keys to RN, stating, " I probably shouldn't have these".  Pt's keys were placed in a plastic bag, labeled w/ his name and placed in his bedside chart.

## 2012-12-23 DIAGNOSIS — E871 Hypo-osmolality and hyponatremia: Principal | ICD-10-CM

## 2012-12-23 LAB — BASIC METABOLIC PANEL
BUN: 12 mg/dL (ref 6–23)
Chloride: 94 mEq/L — ABNORMAL LOW (ref 96–112)
Creatinine, Ser: 0.78 mg/dL (ref 0.50–1.35)
GFR calc Af Amer: >90 mL/min (ref >90)
GFR calc non Af Amer: >90 mL/min (ref >90)
Glucose, Bld: 88 mg/dL (ref 70–99)

## 2012-12-23 MED ORDER — SODIUM CHLORIDE 0.9 % IV SOLN
INTRAVENOUS | Status: DC
  Administered 2012-12-23 – 2012-12-24 (×3): via INTRAVENOUS

## 2012-12-23 MED ORDER — LORAZEPAM 2 MG/ML IJ SOLN
INTRAMUSCULAR | Status: AC
  Filled 2012-12-23: qty 1

## 2012-12-23 MED ORDER — RISPERIDONE 1 MG PO TABS
1.0000 mg | ORAL_TABLET | Freq: Every day | ORAL | Status: DC
  Administered 2012-12-23 – 2012-12-25 (×3): 1 mg via ORAL
  Filled 2012-12-23 (×3): qty 1

## 2012-12-23 MED ORDER — LORAZEPAM 2 MG/ML IJ SOLN
2.0000 mg | Freq: Once | INTRAMUSCULAR | Status: AC
  Administered 2012-12-23: 2 mg via INTRAVENOUS

## 2012-12-23 NOTE — Progress Notes (Signed)
Nutrition Brief Note  Patient identified on the Malnutrition Screening Tool (MST) Report  Body mass index is 27.11 kg/(m^2). Patient meets criteria for overweight based on current BMI.   Current diet order is renal with 1200 ml FR, CHO MOD, patient is consuming approximately 100% of meals at this time. Labs and medications reviewed.   Son present.  States that he has been at his father's apartment.  Father is usually very meticulous however, apple cores and many bowls of food were laying around.  States that his father's current personality is "not his dad".  Sometimes "sneaks" liquor per son.    Patient is eating dinner.  Good appetite with good po.  UBW of 224 lbs with current weight of 211 lbs.  States that he was eating well at home (question recent intake with current medical status prior to admit.)  States that he lost weight by walking a lot.  Tries to follow a diabetic diet.  States that he has been thirsty a lot and was drinking a lot of fluid.    Discussed with MD.  Will change diet to CHO MOD with a 1200 ml Fluid Restriction.  Consider checking HgbA1C.  No further nutrition interventions warranted at this time. If further nutrition issues arise, please consult RD.   Oran Rein, RD, LDN Clinical Inpatient Dietitian Pager:  [phone] Weekend and after hours pager:  [phone]

## 2012-12-23 NOTE — Progress Notes (Signed)
Pt's son, POA, given pt's car and apt keys from chart.

## 2012-12-23 NOTE — Progress Notes (Signed)
Dr. Mahala Menghini made aware pt very agitated,yelling out, arching back and kicking footboard, pulling at his face; Ativan 2 mg IV given; Dr. Mahala Menghini to bedside.

## 2012-12-23 NOTE — Consult Note (Signed)
Psychiatry Consult for Depression  Reason for Consult: Psychosis Referring Physician:  Dr. Raechel Chute. Albert Hess is an 67 y.o. male  Assessment: Axis I: Bipolar I Disorder, most recent episode unspecified,Marijuana Abuse,  Alcohol dependence Axis II: No diagnosis Axis III:  Past Medical History  Diagnosis Date  . Mental disorder   . PTSD (post-traumatic stress disorder)   . Bipolar 2 disorder   . Schizophrenia   . Hypertension   . High cholesterol   . Diabetes mellitus without complication    Axis IV: other psychosocial or environmental problems Axis V: 21-30 behavior considerably influenced by delusions or hallucinations OR serious impairment in judgment, communication OR inability to function in almost all areas  Plan:  Recommend psychiatric Inpatient admission when medically cleared. Continue Risperidone 1 mg, may increase by 0.5 mg daily as needed.  Recommend 1:1 sitter.   Subjective: Albert Hess is a 67 y.o. male patient consulted for Depression.  Psychiatry is consulted for Psychosis. Patient is an unreliable historian.    HPI: The patient reports he had a dream about being in a carnival and playing a game in which he was with a group of people and every has to make a noise and when his turn came he decided to scream and ended up screaming himself wake. He reports still that he owns a significant amount of property in [place], but has no access to his trust fund.   In the area of affective symptoms, patient appears confused. Patient denies current suicidal ideation, intent, or plan. Patient denies current homicidal ideation, intent, or plan. Patient denies auditory hallucinations. Patient denies visual hallucinations. Patient endorses symptoms of paranoia about he hand his family being followed. Patient states sleep is good. Appetite is good. Energy level is increased. Patient denies symptoms of anhedonia. Patient denies hopelessness, helplessness, or guilt.   Denies any  recent episodes consistent with mania, particularly decreased need for sleep with increased energy, grandiosity, impulsivity, hyperverbal and pressured speech, or increased productivity. He states he has had a manic episodes this year and was put on medications. Denies any recent symptoms consistent with psychosis, particularly auditory or visual hallucinations, thought broadcasting/insertion/withdrawal, or ideas of reference. Also denies excessive worry to the point of physical symptoms as well as any panic attacks. Patient reports a history of trauma or symptoms consistent with PTSD such as nightmares, hypervigilance, feelings of numbness or inability to connect with others.   Traumatic Brain Injury: No    ASSESSMENT OF DANGER TO SELF:  Mild.   SUICIDE RISK CHECKLIST: Positive for following:  History of violence Nature conservation officer) , History of impulsivity, History of substance abuse, History of psychosis  Negative for the following: Suicide ideation, Suicide plan, Access to means to implement a plan, Access  to firearms, History of previous attempts or gestures, Sense of hopelessness,  Recent or impending loss and/or absence of social support, Recent or impending  loss of job and/or financial support, Recent diagnosis and/or worsening of a significant medical illness, PROTECTIVE FACTORS:  Evidence of accessible and positively motivated social supports, Therapeutic  alliance with a mental health professional, Children dependent on the patient  for primary care, Future-oriented plans and commitments  ASSESSMENT OF SUICIDE RISK:    ASSESSMENT OF DANGER TO OTHERS:  No significant risk.   HOMICIDE/VIOLENCE RISK CHECKLIST: Negative- Homicidal/violent ideation, Homicide/violence plan, Access to means to implement a plan, Access to firearms, Sense of hopelessness, History of violence, History of impulsivity, History of substance abuse   ASSESSMENT OF  HOMICIDE RISK:  No Significant risk  Past  Medical History  Diagnosis Date  . Mental disorder   . PTSD (post-traumatic stress disorder)   . Bipolar 2 disorder   . Schizophrenia   . Hypertension   . High cholesterol   . Diabetes mellitus without complication    Past Surgical History  Procedure Laterality Date  . Right side      pt points to R side for surgery    reports that he has been smoking Cigarettes.  He has been smoking about 0.00 packs per day. He does not have any smokeless tobacco history on file. He reports that  drinks alcohol. He reports that he uses illicit drugs (Marijuana). History reviewed. No pertinent family history. Allergies:  No Known Allergies  Objective: Blood pressure 135/76, pulse 67, temperature 97.8 F (36.6 C), temperature source Oral, resp. rate 18, height 6\' 2"  (1.88 m), weight 95.8 kg (211 lb 3.2 oz), SpO2 99.00%.Body mass index is 27.11 kg/(m^2). Results for orders placed during the hospital encounter of 12/21/12 (from the past 72 hour(s))  GLUCOSE, CAPILLARY     Status: Abnormal   Collection Time    12/21/12  1:07 PM      Result Value Range   Glucose-Capillary 110 (*) 70 - 99 mg/dL  CBC     Status: Abnormal   Collection Time    12/21/12  1:23 PM      Result Value Range   WBC 4.9  4.0 - 10.5 K/uL   RBC 3.58 (*) 4.22 - 5.81 MIL/uL   Hemoglobin 12.0 (*) 13.0 - 17.0 g/dL   HCT 04.5 (*) 40.9 - 81.1 %   MCV 91.1  78.0 - 100.0 fL   MCH 33.5  26.0 - 34.0 pg   MCHC 36.8 (*) 30.0 - 36.0 g/dL   RDW 91.4 (*) 78.2 - 95.6 %   Platelets 156  150 - 400 K/uL  COMPREHENSIVE METABOLIC PANEL     Status: Abnormal   Collection Time    12/21/12  1:23 PM      Result Value Range   Sodium 119 (*) 135 - 145 mEq/L   Comment: REPEATED TO VERIFY     CRITICAL RESULT CALLED TO, READ BACK BY AND VERIFIED WITH:     BAYNES,A. RN 1455 12/21/12 BARFIELD,T   Potassium 4.7  3.5 - 5.1 mEq/L   Chloride 86 (*) 96 - 112 mEq/L   CO2 22  19 - 32 mEq/L   Glucose, Bld 105 (*) 70 - 99 mg/dL   BUN 21  6 - 23 mg/dL    Creatinine, Ser 2.13  0.50 - 1.35 mg/dL   Calcium 9.2  8.4 - 08.6 mg/dL   Total Protein 6.7  6.0 - 8.3 g/dL   Albumin 4.0  3.5 - 5.2 g/dL   AST 31  0 - 37 U/L   ALT 19  0 - 53 U/L   Alkaline Phosphatase 53  39 - 117 U/L   Total Bilirubin 0.5  0.3 - 1.2 mg/dL   GFR calc non Af Amer 87 (*) >90 mL/min   GFR calc Af Amer >90  >90 mL/min   Comment:            The eGFR has been calculated     using the CKD EPI equation.     This calculation has not been     validated in all clinical     situations.     eGFR's persistently     <  90 mL/min signify     possible Chronic Kidney Disease.  ETHANOL     Status: None   Collection Time    12/21/12  1:23 PM      Result Value Range   Alcohol, Ethyl (B) <11  0 - 11 mg/dL   Comment:            LOWEST DETECTABLE LIMIT FOR     SERUM ALCOHOL IS 11 mg/dL     FOR MEDICAL PURPOSES ONLY  URINE RAPID DRUG SCREEN (HOSP PERFORMED)     Status: Abnormal   Collection Time    12/21/12  1:40 PM      Result Value Range   Opiates NONE DETECTED  NONE DETECTED   Cocaine NONE DETECTED  NONE DETECTED   Benzodiazepines NONE DETECTED  NONE DETECTED   Amphetamines NONE DETECTED  NONE DETECTED   Tetrahydrocannabinol POSITIVE (*) NONE DETECTED   Barbiturates NONE DETECTED  NONE DETECTED   Comment:            DRUG SCREEN FOR MEDICAL PURPOSES     ONLY.  IF CONFIRMATION IS NEEDED     FOR ANY PURPOSE, NOTIFY LAB     WITHIN 5 DAYS.                LOWEST DETECTABLE LIMITS     FOR URINE DRUG SCREEN     Drug Class       Cutoff (ng/mL)     Amphetamine      1000     Barbiturate      200     Benzodiazepine   200     Tricyclics       300     Opiates          300     Cocaine          300     THC              50  BASIC METABOLIC PANEL     Status: Abnormal   Collection Time    12/22/12  5:23 AM      Result Value Range   Sodium 128 (*) 135 - 145 mEq/L   Comment: DELTA CHECK NOTED   Potassium 4.7  3.5 - 5.1 mEq/L   Chloride 96  96 - 112 mEq/L   Comment: DELTA CHECK  NOTED   CO2 23  19 - 32 mEq/L   Glucose, Bld 93  70 - 99 mg/dL   BUN 13  6 - 23 mg/dL   Creatinine, Ser 1.61  0.50 - 1.35 mg/dL   Calcium 9.4  8.4 - 09.6 mg/dL   GFR calc non Af Amer >90  >90 mL/min   GFR calc Af Amer >90  >90 mL/min   Comment:            The eGFR has been calculated     using the CKD EPI equation.     This calculation has not been     validated in all clinical     situations.     eGFR's persistently     <90 mL/min signify     possible Chronic Kidney Disease.  CBC     Status: Abnormal   Collection Time    12/22/12  5:23 AM      Result Value Range   WBC 3.6 (*) 4.0 - 10.5 K/uL   RBC 3.57 (*) 4.22 - 5.81 MIL/uL   Hemoglobin 11.9 (*) 13.0 - 17.0  g/dL   HCT 09.8 (*) 11.9 - 14.7 %   MCV 90.8  78.0 - 100.0 fL   MCH 33.3  26.0 - 34.0 pg   MCHC 36.7 (*) 30.0 - 36.0 g/dL   Comment: PRE-WARMING TECHNIQUE USED   RDW 11.7  11.5 - 15.5 %   Platelets 149 (*) 150 - 400 K/uL  BASIC METABOLIC PANEL     Status: Abnormal   Collection Time    12/23/12  5:32 AM      Result Value Range   Sodium 125 (*) 135 - 145 mEq/L   Potassium 4.6  3.5 - 5.1 mEq/L   Chloride 94 (*) 96 - 112 mEq/L   CO2 25  19 - 32 mEq/L   Glucose, Bld 88  70 - 99 mg/dL   BUN 12  6 - 23 mg/dL   Creatinine, Ser 8.29  0.50 - 1.35 mg/dL   Calcium 9.2  8.4 - 56.2 mg/dL   GFR calc non Af Amer >90  >90 mL/min   GFR calc Af Amer >90  >90 mL/min   Comment:            The eGFR has been calculated     using the CKD EPI equation.     This calculation has not been     validated in all clinical     situations.     eGFR's persistently     <90 mL/min signify     possible Chronic Kidney Disease.   Labs are reviewed.  Current Facility-Administered Medications  Medication Dose Route Frequency Provider Last Rate Last Dose  . 0.9 %  sodium chloride infusion   Intravenous Continuous Rhetta Mura, MD 75 mL/hr at 12/23/12 1209    . enoxaparin (LOVENOX) injection 40 mg  40 mg Subcutaneous Q24H Dorothea Ogle,  MD   40 mg at 12/22/12 2301  . lisinopril (PRINIVIL,ZESTRIL) tablet 10 mg  10 mg Oral Daily Rhetta Mura, MD   10 mg at 12/23/12 1102  . LORazepam (ATIVAN) 2 MG/ML injection           . ondansetron (ZOFRAN) tablet 4 mg  4 mg Oral Q6H PRN Dorothea Ogle, MD       Or  . ondansetron [facility]) injection 4 mg  4 mg Intravenous Q6H PRN Dorothea Ogle, MD      . polyvinyl alcohol (LIQUIFILM TEARS) 1.4 % ophthalmic solution 1 drop  1 drop Both Eyes PRN Rhetta Mura, MD      . risperiDONE (RISPERDAL) tablet 1 mg  1 mg Oral Daily Rhetta Mura, MD   1 mg at 12/23/12 1102    Psychiatric Specialty Exam: Physical Exam  Vitals reviewed. Constitutional: He appears well-developed and well-nourished. No distress.  Skin: He is not diaphoretic.    Review of Systems  Constitutional: Negative for fever, chills, weight loss and diaphoresis.  Respiratory: Negative for cough, hemoptysis, sputum production, shortness of breath and wheezing.   Cardiovascular: Positive for leg swelling. Negative for chest pain and palpitations.  Gastrointestinal: Negative for heartburn, nausea, vomiting, abdominal pain, diarrhea and constipation.  Neurological: Negative for dizziness, tingling, focal weakness, seizures and loss of consciousness.    Blood pressure 135/76, pulse 67, temperature 97.8 F (36.6 C), temperature source Oral, resp. rate 18, height 6\' 2"  (1.88 m), weight 95.8 kg (211 lb 3.2 oz), SpO2 99.00%.Body mass index is 27.11 kg/(m^2).  General Appearance: Casual and Fairly Groomed  Eye Contact::  Poor  Speech:  Clear and Coherent and  Normal Rate  Volume:  Normal  Mood:  "better"  Affect:  Blunt  Thought Process:  Goal Directed  Orientation:  Other:  Oriented to Month, year, person, state, and city.  Thought Content:  Patient denies auditory or visual hallucinations.  Suicidal Thoughts:  No  Homicidal Thoughts:  No  Memory:  Immediate;   Good Recent;   Good Remote;   Fair  Judgement:   Impaired  Insight:  Lacking  Psychomotor Activity:  Normal  Concentration:  Fair  Recall:  Poor  Akathisia:  Negative  Handed:  Right  AIMS (if indicated):   As noted in Chart  Assets:  Communication Skills Desire for Improvement Financial Resources/Insurance Housing Transportation  Sleep:   States he slept well    Ayse Mccartin, [facility] 12/23/2012 7:08 PM

## 2012-12-23 NOTE — Progress Notes (Addendum)
Subjective: Patient has experienced a recurrent episode of stiffness of his extremities with inability to speak which occurred this morning. He thinks it lasted about 45 seconds. He was aware of his surroundings and did not have any alteration in consciousness.  Objective: Current vital signs: BP 130/74  Pulse 75  Temp(Src) 98.1 F (36.7 C) (Oral)  Resp 20  Ht 6\' 2"  (1.88 m)  Wt 95.8 kg (211 lb 3.2 oz)  BMI 27.11 kg/m2  SpO2 97%  Neurologic Exam: Alert and in no acute distress. Patient is well-oriented to time person and place. He had no receptive or expressive aphasia. Speech was normal. No facial weakness. Moves extremities with good symmetrical strength.  Lab Results: Results for orders placed during the hospital encounter of 12/21/12 (from the past 48 hour(s))  GLUCOSE, CAPILLARY     Status: Abnormal   Collection Time    12/21/12  1:07 PM      Result Value Range   Glucose-Capillary 110 (*) 70 - 99 mg/dL  CBC     Status: Abnormal   Collection Time    12/21/12  1:23 PM      Result Value Range   WBC 4.9  4.0 - 10.5 K/uL   RBC 3.58 (*) 4.22 - 5.81 MIL/uL   Hemoglobin 12.0 (*) 13.0 - 17.0 g/dL   HCT 40.9 (*) 81.1 - 91.4 %   MCV 91.1  78.0 - 100.0 fL   MCH 33.5  26.0 - 34.0 pg   MCHC 36.8 (*) 30.0 - 36.0 g/dL   RDW 78.2 (*) 95.6 - 21.3 %   Platelets 156  150 - 400 K/uL  COMPREHENSIVE METABOLIC PANEL     Status: Abnormal   Collection Time    12/21/12  1:23 PM      Result Value Range   Sodium 119 (*) 135 - 145 mEq/L   Comment: REPEATED TO VERIFY     CRITICAL RESULT CALLED TO, READ BACK BY AND VERIFIED WITH:     BAYNES,A. RN 1455 12/21/12 BARFIELD,T   Potassium 4.7  3.5 - 5.1 mEq/L   Chloride 86 (*) 96 - 112 mEq/L   CO2 22  19 - 32 mEq/L   Glucose, Bld 105 (*) 70 - 99 mg/dL   BUN 21  6 - 23 mg/dL   Creatinine, Ser 0.86  0.50 - 1.35 mg/dL   Calcium 9.2  8.4 - 57.8 mg/dL   Total Protein 6.7  6.0 - 8.3 g/dL   Albumin 4.0  3.5 - 5.2 g/dL   AST 31  0 - 37 U/L   ALT 19   0 - 53 U/L   Alkaline Phosphatase 53  39 - 117 U/L   Total Bilirubin 0.5  0.3 - 1.2 mg/dL   GFR calc non Af Amer 87 (*) >90 mL/min   GFR calc Af Amer >90  >90 mL/min   Comment:            The eGFR has been calculated     using the CKD EPI equation.     This calculation has not been     validated in all clinical     situations.     eGFR's persistently     <90 mL/min signify     possible Chronic Kidney Disease.  ETHANOL     Status: None   Collection Time    12/21/12  1:23 PM      Result Value Range   Alcohol, Ethyl (B) <11  0 - 11  mg/dL   Comment:            LOWEST DETECTABLE LIMIT FOR     SERUM ALCOHOL IS 11 mg/dL     FOR MEDICAL PURPOSES ONLY  URINE RAPID DRUG SCREEN (HOSP PERFORMED)     Status: Abnormal   Collection Time    12/21/12  1:40 PM      Result Value Range   Opiates NONE DETECTED  NONE DETECTED   Cocaine NONE DETECTED  NONE DETECTED   Benzodiazepines NONE DETECTED  NONE DETECTED   Amphetamines NONE DETECTED  NONE DETECTED   Tetrahydrocannabinol POSITIVE (*) NONE DETECTED   Barbiturates NONE DETECTED  NONE DETECTED   Comment:            DRUG SCREEN FOR MEDICAL PURPOSES     ONLY.  IF CONFIRMATION IS NEEDED     FOR ANY PURPOSE, NOTIFY LAB     WITHIN 5 DAYS.                LOWEST DETECTABLE LIMITS     FOR URINE DRUG SCREEN     Drug Class       Cutoff (ng/mL)     Amphetamine      1000     Barbiturate      200     Benzodiazepine   200     Tricyclics       300     Opiates          300     Cocaine          300     THC              50  BASIC METABOLIC PANEL     Status: Abnormal   Collection Time    12/22/12  5:23 AM      Result Value Range   Sodium 128 (*) 135 - 145 mEq/L   Comment: DELTA CHECK NOTED   Potassium 4.7  3.5 - 5.1 mEq/L   Chloride 96  96 - 112 mEq/L   Comment: DELTA CHECK NOTED   CO2 23  19 - 32 mEq/L   Glucose, Bld 93  70 - 99 mg/dL   BUN 13  6 - 23 mg/dL   Creatinine, Ser 2.13  0.50 - 1.35 mg/dL   Calcium 9.4  8.4 - 08.6 mg/dL   GFR calc  non Af Amer >90  >90 mL/min   GFR calc Af Amer >90  >90 mL/min   Comment:            The eGFR has been calculated     using the CKD EPI equation.     This calculation has not been     validated in all clinical     situations.     eGFR's persistently     <90 mL/min signify     possible Chronic Kidney Disease.  CBC     Status: Abnormal   Collection Time    12/22/12  5:23 AM      Result Value Range   WBC 3.6 (*) 4.0 - 10.5 K/uL   RBC 3.57 (*) 4.22 - 5.81 MIL/uL   Hemoglobin 11.9 (*) 13.0 - 17.0 g/dL   HCT 57.8 (*) 46.9 - 62.9 %   MCV 90.8  78.0 - 100.0 fL   MCH 33.3  26.0 - 34.0 pg   MCHC 36.7 (*) 30.0 - 36.0 g/dL   Comment: PRE-WARMING TECHNIQUE USED   RDW 11.7  11.5 - 15.5 %   Platelets 149 (*) 150 - 400 K/uL  BASIC METABOLIC PANEL     Status: Abnormal   Collection Time    12/23/12  5:32 AM      Result Value Range   Sodium 125 (*) 135 - 145 mEq/L   Potassium 4.6  3.5 - 5.1 mEq/L   Chloride 94 (*) 96 - 112 mEq/L   CO2 25  19 - 32 mEq/L   Glucose, Bld 88  70 - 99 mg/dL   BUN 12  6 - 23 mg/dL   Creatinine, Ser 7.82  0.50 - 1.35 mg/dL   Calcium 9.2  8.4 - 95.6 mg/dL   GFR calc non Af Amer >90  >90 mL/min   GFR calc Af Amer >90  >90 mL/min   Comment:            The eGFR has been calculated     using the CKD EPI equation.     This calculation has not been     validated in all clinical     situations.     eGFR's persistently     <90 mL/min signify     possible Chronic Kidney Disease.    Studies/Results: No results found.  Medications:  I have reviewed the patient's current medications. Scheduled: . enoxaparin (LOVENOX) injection  40 mg Subcutaneous Q24H  . lisinopril  10 mg Oral Daily  . LORazepam      . risperiDONE  1 mg Oral Daily   OZH:YQMVHQIONGE (ZOFRAN) IV, ondansetron, polyvinyl alcohol  Assessment/Plan: Etiology for patient's spells as described above remains unclear, possibly related to hyponatremia. New-onset partial seizure disorder is unlikely but  cannot be ruled out. No anticonvulsant medication is recommended at this point. EEG is planned for tomorrow.  C.R. Roseanne Reno, MD [facility] [phone]  12/23/2012  9:15 AM

## 2012-12-23 NOTE — Progress Notes (Signed)
PROGRESS NOTE  Albert Hess ZOX:[id number] DOB: Jan 17, 1946 DOA: 12/21/2012 PCP: Bradd Burner, PA-C  Brief narrative: 67 yr old male admitted to [facility] 4.4.14 with Hallucination and delusional behavior in a setting of Hyponatramia  Past medical history-As per Problem list Chart reviewed as below- ED assessment 2.16.13 with delusions-aggressive, [facility]   Consultants:  Psychiatry  Procedures:  none  Antibiotics:  none   Subjective  Overnight events noted. Called by nursing as patint arching his back, screaming, making self-flagellating gestures, rasing his hand in the air, non-verbal purposeless movements and can repsond to me by shrugging and moving, but not able to speak.  Very unlikely seizure activity-strongly suspect psychiatric illness at work.      Objective    Interim History: nad  Telemetry: Non-tele  Objective: Filed Vitals:   12/22/12 1618 12/22/12 2218 12/23/12 0500 12/23/12 0534  BP: 128/80 110/64  130/74  Pulse: 99 81  75  Temp: 99.2 F (37.3 C) 97.4 F (36.3 C)  98.1 F (36.7 C)  TempSrc: Oral Oral  Oral  Resp: 18 20  20   Height:      Weight:   95.8 kg (211 lb 3.2 oz)   SpO2: 99% 98%  97%    Intake/Output Summary (Last 24 hours) at 12/23/12 0920 Last data filed at 12/23/12 0846  Gross per 24 hour  Intake 2495.42 ml  Output      0 ml  Net 2495.42 ml    Exam:  General: alert, pleasant, good eye contact but pressured speech.   Cardiovascular:  s1 s2 no m/r/g Respiratory: cle Neurogrossly moving all 4 limbs  Data Reviewed: Basic Metabolic Panel:  Recent Labs Lab 12/21/12 1323 12/22/12 0523 12/23/12 0532  NA 119* 128* 125*  K 4.7 4.7 4.6  CL 86* 96 94*  CO2 22 23 25   GLUCOSE 105* 93 88  BUN 21 13 12   CREATININE 0.90 0.70 0.78  CALCIUM 9.2 9.4 9.2   Liver Function Tests:  Recent Labs Lab 12/21/12 1323  AST 31  ALT 19  ALKPHOS 53  BILITOT 0.5  PROT 6.7  ALBUMIN 4.0   No results found for this  basename: LIPASE, AMYLASE,  in the last 168 hours No results found for this basename: AMMONIA,  in the last 168 hours CBC:  Recent Labs Lab 12/21/12 1323 12/22/12 0523  WBC 4.9 3.6*  HGB 12.0* 11.9*  HCT 32.6* 32.4*  MCV 91.1 90.8  PLT 156 149*   Cardiac Enzymes: No results found for this basename: CKTOTAL, CKMB, CKMBINDEX, TROPONINI,  in the last 168 hours BNP: No components found with this basename: POCBNP,  CBG:  Recent Labs Lab 12/21/12 1307  GLUCAP 110*    No results found for this or any previous visit (from the past 240 hour(s)).   Studies:              All Imaging reviewed and is as per above notation   Scheduled Meds: . enoxaparin (LOVENOX) injection  40 mg Subcutaneous Q24H  . lisinopril  10 mg Oral Daily  . LORazepam      . risperiDONE  1 mg Oral Daily   Continuous Infusions:     Assessment/Plan: 1. 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  Will hold Thiazide on d/c-pt requested d/c IVF to improve his mobility 4.5-re-implemented on 12/23/12 2. Extremely unlikely Seizure disorder-symptoms ado not correlate with any spectrum of seizure disorder.  Will complete EEG as requested.  No  current indication for AED.   3. Schizophrenia+delusions and potential PTSD-patient suicdial in the past per chart review.  Given Vebral order for IV ativan 2 mg and initation of Riperdal 0.5 mg now as well-defer to psychiatry.  Will need intensive Rx 4. Htn-poorly controlled-- add back lisnopril at dose of 10 mg 5. Possible DM-has been on metfromin in the past-will re-implmenet on d/c 6. Ho Cannabis use-noted on tox screen  Code Status: Full Family Communication:  None at bedside  Disposition Plan: inpatient-pending further psychiatry input   Pleas Koch, MD  [facility] Pager [phone] 12/23/2012, 9:20 AM    LOS: 2 days

## 2012-12-24 ENCOUNTER — Inpatient Hospital Stay (HOSPITAL_COMMUNITY)
Admit: 2012-12-24 | Discharge: 2012-12-24 | Disposition: A | Discharge status: Home / Self Care | Payer: Medicare Other | Attending: Neurology | Admitting: Neurology

## 2012-12-24 DIAGNOSIS — F1210 Cannabis abuse, uncomplicated: Secondary | ICD-10-CM

## 2012-12-24 DIAGNOSIS — F319 Bipolar disorder, unspecified: Secondary | ICD-10-CM

## 2012-12-24 DIAGNOSIS — F1020 Alcohol dependence, uncomplicated: Secondary | ICD-10-CM

## 2012-12-24 LAB — BASIC METABOLIC PANEL
BUN: 14 mg/dL (ref 6–23)
BUN: 13 mg/dL (ref 6–23)
CO2: 26 mEq/L (ref 19–32)
Chloride: 100 mEq/L (ref 96–112)
Chloride: 99 mEq/L (ref 96–112)
Creatinine, Ser: 0.87 mg/dL (ref 0.50–1.35)
Glucose, Bld: 97 mg/dL (ref 70–99)
Potassium: 4.7 mEq/L (ref 3.5–5.1)

## 2012-12-24 NOTE — Progress Notes (Signed)
Clinical Social Work  Psych MD requests patient be placed under IVC if refusing placement. CSW faxed forms to Magistrate and confirmed that paperwork was received. CSW will search for inpatient psychiatric hospital when medically stable.   [place], [place] [phone]

## 2012-12-24 NOTE — Progress Notes (Signed)
NEURO HOSPITALIST PROGRESS NOTE   SUBJECTIVE:                                                                                                                         Patient having no complaints.  His is making random jokes about the weather.  OBJECTIVE:                                                                                                                           Vital signs in last 24 hours: Temp:  [97.8 F (36.6 C)-98.2 F (36.8 C)] 98.2 F (36.8 C) (04/07 0600) Pulse Rate:  [67-82] 82 (04/07 0600) Resp:  [18-20] 20 (04/07 0600) BP: (112-135)/(67-77) 112/67 mmHg (04/07 0600) SpO2:  [98 %-100 %] 98 % (04/07 0600)  Intake/Output from previous day: 04/06 0701 - 04/07 0700 In: 3470 [P.O.:1920; I.V.:1550] Out: -  Intake/Output this shift:   Nutritional status: Carb Control  Past Medical History  Diagnosis Date  . Mental disorder   . PTSD (post-traumatic stress disorder)   . Bipolar 2 disorder   . Schizophrenia   . Hypertension   . High cholesterol   . Diabetes mellitus without complication     Neurologic Exam:  Mental Status: Alert, oriented, thought content appropriate.  Speech fluent without evidence of aphasia.  Able to follow 3 step commands without difficulty. Cranial Nerves: II: Discs flat bilaterally; Visual fields grossly normal, pupils equal, round, reactive to light and accommodation III,IV, VI: ptosis not present, extra-ocular motions intact bilaterally (initially patient would not follow my finger when asked, but when told to look in the directions he did so without problems) V,VII: smile symmetric, facial light touch sensation normal bilaterally VIII: hearing normal bilaterally IX,X: gag reflex present XI: bilateral shoulder shrug XII: midline tongue extension Motor: Right : Upper extremity   5/5    Left:     Upper extremity   5/5  Lower extremity   5/5     Lower extremity   5/5 Tone and bulk:normal tone  throughout; no atrophy noted Sensory: Pinprick and light touch intact throughout, bilaterally Deep Tendon Reflexes: 2+ and symmetric throughout Plantars: Right: downgoing   Left: downgoing Cerebellar: normal finger-to-nose,  normal heel-to-shin test  CV:  pulses palpable throughout    Lab Results: No results found for this basename: cbc, bmp, coags, chol, tri, ldl, hga1c   Lipid Panel No results found for this basename: CHOL, TRIG, HDL, CHOLHDL, VLDL, LDLCALC,  in the last 72 hours  Studies/Results: No results found.  MEDICATIONS                                                                                                                        Scheduled: . enoxaparin (LOVENOX) injection  40 mg Subcutaneous Q24H  . lisinopril  10 mg Oral Daily  . risperiDONE  1 mg Oral Daily    ASSESSMENT/PLAN:                                                                                                            Etiology for patient's spells still remains unclear. New-onset partial seizure disorder is unlikely but cannot be ruled out --receiving EEG today. No anticonvulsant medication is recommended at this point. I have explained that EEG will happen at some point today and then is still will need to be read--he understands this .   Will follow EEG--and make further recommendations.   Assessment and plan discussed with with attending physician and they are in agreement.    Felicie Morn PA-C [facility] [phone]  12/24/2012, 8:53 AM

## 2012-12-24 NOTE — Consult Note (Signed)
Reason for Consult: Bipolar, manic psychosis Referring Physician: Dr. Karalee Height Albert Hess is an 67 y.o. male.  HPI: Patient was seen and chart reviewed. Patient continued to be psychotic, delusional, paranoid, and manic. Patient was not psychiatrically cleared to be detested home. Patient needed involuntary commitment petitioned for psychiatric inpatient hospitalization for crisis stabilization and medication management.  MSE: Patient stated mood is "I am feeling happy and excellent". Patient affect was labile and he has loud speech. He also has a loosening of associations or flight of ideations. Patient denied suicidal onset ideation has no evidence of auditory or visual hallucinations patient is a poor insight judgment and impulse control.   Past Medical History  Diagnosis Date  . Mental disorder   . PTSD (post-traumatic stress disorder)   . Bipolar 2 disorder   . Schizophrenia   . Hypertension   . High cholesterol   . Diabetes mellitus without complication     Past Surgical History  Procedure Laterality Date  . Right side      pt points to R side for surgery    History reviewed. No pertinent family history.  Social History:  reports that he has been smoking Cigarettes.  He has been smoking about 0.00 packs per day. He does not have any smokeless tobacco history on file. He reports that  drinks alcohol. He reports that he uses illicit drugs (Marijuana).  Allergies: No Known Allergies  Medications: I have reviewed the patient's current medications.  Results for orders placed during the hospital encounter of 12/21/12 (from the past 48 hour(s))  BASIC METABOLIC PANEL     Status: Abnormal   Collection Time    12/23/12  5:32 AM      Result Value Range   Sodium 125 (*) 135 - 145 mEq/L   Potassium 4.6  3.5 - 5.1 mEq/L   Chloride 94 (*) 96 - 112 mEq/L   CO2 25  19 - 32 mEq/L   Glucose, Bld 88  70 - 99 mg/dL   BUN 12  6 - 23 mg/dL   Creatinine, Ser 4.09  0.50 - 1.35 mg/dL   Calcium 9.2  8.4 - 81.1 mg/dL   GFR calc non Af Amer >90  >90 mL/min   GFR calc Af Amer >90  >90 mL/min   Comment:            The eGFR has been calculated     using the CKD EPI equation.     This calculation has not been     validated in all clinical     situations.     eGFR's persistently     <90 mL/min signify     possible Chronic Kidney Disease.  BASIC METABOLIC PANEL     Status: Abnormal   Collection Time    12/24/12  4:55 AM      Result Value Range   Sodium 131 (*) 135 - 145 mEq/L   Potassium 4.7  3.5 - 5.1 mEq/L   Chloride 100  96 - 112 mEq/L   CO2 23  19 - 32 mEq/L   Glucose, Bld 97  70 - 99 mg/dL   BUN 14  6 - 23 mg/dL   Creatinine, Ser 9.14  0.50 - 1.35 mg/dL   Calcium 9.0  8.4 - 78.2 mg/dL   GFR calc non Af Amer >90  >90 mL/min   GFR calc Af Amer >90  >90 mL/min   Comment:  The eGFR has been calculated     using the CKD EPI equation.     This calculation has not been     validated in all clinical     situations.     eGFR's persistently     <90 mL/min signify     possible Chronic Kidney Disease.    No results found.  Positive for anxiety, bad mood, bipolar, depression, illegal drug usage, mood swings, sleep disturbance and Bizarre behaviors, like walking on before and highly Blood pressure 112/67, pulse 82, temperature 98.2 F (36.8 C), temperature source Oral, resp. rate 20, height 6\' 2"  (1.88 m), weight 211 lb 3.2 oz (95.8 kg), SpO2 98.00%.   Assessment/Plan: Bipolar I Disorder, most recent episode unspecified, Marijuana Abuse Alcohol dependence  Recommendation: Continue his current medication management and recommend a involuntary inpatient psychiatric admission , When medically cleared. Continue suture for safety. Involuntary commitment petition was initiated.   Albert Hess,Albert R. 12/24/2012, 2:32 PM

## 2012-12-24 NOTE — Progress Notes (Signed)
PROGRESS NOTE  Albert Hess ZOX:[id number] DOB: 1946-01-19 DOA: 12/21/2012 PCP: Bradd Burner, PA-C  Brief narrative: 67 yr old male admitted to [facility] 4.4.14 with Hallucination and delusional behavior in a setting of Hyponatramia  Past medical history-As per Problem list Chart reviewed as below- ED assessment 2.16.13 with delusions-aggressive, [facility]   Consultants:  Psychiatry  Procedures:  none  Antibiotics:  none   Subjective  Quiet.  Laying at bedside with EEG leads on when I saw him NO c/o Nursing has nothing new to report and patient has been calm    Objective    Interim History: Noted nutritionist input 4.6 that 'patient drinks a lot of water'  Telemetry: Non-tele  Objective: Filed Vitals:   12/23/12 0534 12/23/12 1516 12/23/12 2200 12/24/12 0600  BP: 130/74 135/76 132/77 112/67  Pulse: 75 67 74 82  Temp: 98.1 F (36.7 C) 97.8 F (36.6 C) 97.9 F (36.6 C) 98.2 F (36.8 C)  TempSrc: Oral Oral Oral Oral  Resp: 20 18 20 20   Height:      Weight:      SpO2: 97% 99% 100% 98%    Intake/Output Summary (Last 24 hours) at 12/24/12 1303 Last data filed at 12/24/12 0906  Gross per 24 hour  Intake   2990 ml  Output      0 ml  Net   2990 ml    Exam:  General: alert, pleasant, good eye contact  Cardiovascular:  s1 s2 no m/r/g Respiratory: cle   Data Reviewed: Basic Metabolic Panel:  Recent Labs Lab 12/21/12 1323 12/22/12 0523 12/23/12 0532 12/24/12 0455  NA 119* 128* 125* 131*  K 4.7 4.7 4.6 4.7  CL 86* 96 94* 100  CO2 22 23 25 23   GLUCOSE 105* 93 88 97  BUN 21 13 12 14   CREATININE 0.90 0.70 0.78 0.80  CALCIUM 9.2 9.4 9.2 9.0   Liver Function Tests:  Recent Labs Lab 12/21/12 1323  AST 31  ALT 19  ALKPHOS 53  BILITOT 0.5  PROT 6.7  ALBUMIN 4.0   No results found for this basename: LIPASE, AMYLASE,  in the last 168 hours No results found for this basename: AMMONIA,  in the last 168 hours CBC:  Recent  Labs Lab 12/21/12 1323 12/22/12 0523  WBC 4.9 3.6*  HGB 12.0* 11.9*  HCT 32.6* 32.4*  MCV 91.1 90.8  PLT 156 149*   Cardiac Enzymes: No results found for this basename: CKTOTAL, CKMB, CKMBINDEX, TROPONINI,  in the last 168 hours BNP: No components found with this basename: POCBNP,  CBG:  Recent Labs Lab 12/21/12 1307  GLUCAP 110*    No results found for this or any previous visit (from the past 240 hour(s)).   Studies:              All Imaging reviewed and is as per above notation   Scheduled Meds: . enoxaparin (LOVENOX) injection  40 mg Subcutaneous Q24H  . lisinopril  10 mg Oral Daily  . risperiDONE  1 mg Oral Daily   Continuous Infusions: . sodium chloride 75 mL/hr at 12/24/12 0117     Assessment/Plan: 1. 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  Will hold Thiazide on d/c.  Continue fluid restriction.  D/c IVF today-repeat BMET in am 2. Extremely unlikely Seizure disorder-Appreciate Neurology symptoms do not correlate with any spectrum of seizure disorder.  Await EEG as requested.  No current indication for AED.   3.  Schizophrenia+delusions and potential PTSD-patient suicidal in the past per chart review.  Appreciate psychiatry input-Continue Risperdal 1 mg daily.  Continue sitter 4. Htn-poorly controlled--add back lisnopril at dose of 10 mg 5. Possible DM-has been on metformin in the past-will re-implement on d/c 6. Ho Cannabis use-noted on tox screen  Code Status: Full Family Communication:  None at bedside  Disposition Plan: inpatient-pending further psychiatry input   Pleas Koch, MD  [facility] Pager [phone] 12/24/2012, 1:03 PM    LOS: 3 days

## 2012-12-24 NOTE — Progress Notes (Signed)
Clinical Social Work Department CLINICAL SOCIAL WORK PSYCHIATRY SERVICE LINE ASSESSMENT 12/24/2012  Patient:  Albert Hess  Account:  [id number]  Admit Date:  12/21/2012  Clinical Social Worker:  Unk Lightning, LCSW  Date/Time:  12/24/2012 10:00 AM Referred by:  Physician  Date referred:  12/24/2012 Reason for Referral  Behavioral Health Issues   Presenting Symptoms/Problems (In the person's/family's own words):   Psych consulted due to delusions   Abuse/Neglect/Trauma History (check all that apply)  Witness to trauma   Abuse/Neglect/Trauma Comments:   Patient is a veteran and served in [place].   Psychiatric History (check all that apply)  Inpatient/hospitilization   Psychiatric medications:  Celexa 20 mg  Risperdal 1 mg   Current Mental Health Hospitalizations/Previous Mental Health History:   Patient reports he was diagnosed with bipolar disorder a few years ago. Patient reports he was a heavy drinker and when he tried to eliminate his use, his PCP diagnosed him with bipolar and prescribed him medications. Patient does not feel that medication was helpful.   Current provider:   None   Place and Date:   N/A   Current Medications:   ondansetron (ZOFRAN) IV, ondansetron, polyvinyl alcohol            . enoxaparin (LOVENOX) injection  40 mg Subcutaneous Q24H  . lisinopril  10 mg Oral Daily  . risperiDONE  1 mg Oral Daily   Previous Impatient Admission/Date/Reason:   Patient stayed at [facility] for about 3-4 weeks last year. Patient was IVC by family.   Emotional Health / Current Symptoms    Suicide/Self Harm  Suicide attempt in past (date/description)   Suicide attempt in the past:   Patient reports in 1982 he tried to kill himself. Patient reports that any solider has thought about suicide or attempted suicide in order to try and forget the war. Patient denies any current SI or HI.   Other harmful behavior:   Patient delusional and unable to properly care for himself at  this time.   Psychotic/Dissociative Symptoms  Delusional   Other Psychotic/Dissociative Symptoms:   Patient reports that he was admitted after walking on the streets. Patient reports that people were being bused out of the city and everyone was abandoning their cars and walking down the streets. Patient reports since everyone was leaving, he did not need to lock his apartment and just started walking. Patient reports the government will not tell us what was going on for people to be leaving but that we would soon find out.    Attention/Behavioral Symptoms  Inattentive   Other Attention / Behavioral Symptoms:   Patient will answer a question quickly and then state "next question now." When asked to elaborate on answers of provide more details, patient becomes defensive and reports he wants to "move on."    Cognitive Impairment  Poor/Impaired Decision-Making   Other Cognitive Impairment:   Patient's delusions cause him to make poor decisions and becomes impulsive with decisions.    Mood and Adjustment  Euphoric  Unusual/bizarre    Stress, Anxiety, Trauma, Any Recent Loss/Stressor  Other - See comment   Anxiety (frequency):   Phobia (specify):   Compulsive behavior (specify):   Obsessive behavior (specify):   Other:   Patient reports low informal supports. Patient contradicts himself several times throughout the assessment regarding if he has a positive or negative relationship with son.   Substance Abuse/Use  Current substance use   SBIRT completed (please refer for detailed history):  Jeannie Fend  Self-reported substance use:   Patient reports he smokes THC on daily basis because he enjoys it. Patient first reports that he drinks half a gallon of liquor a day but then later retracts that story and states that he has 1 shooter a day. Patient denies any further substance use.   Urinary Drug Screen Completed:  Y Alcohol level:   THC positive    Environmental/Housing/Living  Arrangement  Stable housing   Who is in the home:   Alone   Emergency contact:  Albert Hess    **Reports he is also POA. CSW encouraged son to bring paperwork to hospital.   Financial  Medicare   Patient's Strengths and Goals (patient's own words):   Patient reports he wants to leave the hospital and go home. Patient reports that he cares for himself and was independent prior to admission.   Clinical Social Worker's Interpretive Summary:   CSW received referral to complete psychosocial assessment. CSW reviewed chart which stated that psych MD recommends inpatient treatment. CSW met with patient at bedside. No visitors present during assessment.    CSW introduced myself and explained role. Patient sitting in bed and watching tv. Patient agreeable to assessment and shortly into assessment, son called patient. Patient spoke with son and made jokes throughout call. Patient told son he could visit anytime he wanted to. CSW spoke with patient regarding living arrangements and relationships prior to admission.    Patient lives alone in an apartment. Patient has one son and one dtr. Patient has been married twice and is currently divorced. Patient had a girlfriend last year but reports she had him committed to [facility] and therefore he never spoke to her again after he was released. Patient reports low informal supports.    Patient agreeable to discuss [place] stay. Patient reports that he stayed at [facility] for about one month last year. Patient reports it was a nice stay but reports it was not necessary. Patient states that after he was released from [facility] that it was required that he see a psychiatrist. Patient reports that he did not like outpatient follow up and therefore stopped going. Patient reports no follow up or medications since release from [facility].    Patient delusional throughout assessment. Patient makes statements that he owns [place] and he is the richest man. Patient reports that he  is powerful and if he is upset he will tell everyone in the world that person "will be taken care of." Patient speaks of attacks that occur but government will not inform citizens of information.    Patient engaged during assessment with direct eye contact throughout assessment. Patient contradicts himself several times and is a poor historian. Patient becomes guarded at times and refuses to elaborate on answers. Patient has inappropriate laughter and gives tangential information. Patient describes several delusions and is unable to focus on reality. Patient reports he does not need inpatient treatment and will dc home when medically stable. Patient gave CSW permission to contact son.    CSW spoke with son (Albert Hess) via phone. Son reports that patient was "acting strange" and called family to report his phone was discharging him and that they did not need to call him but that patient would contact family. Son was worried about patient's wellbeing so he went to patient's apartment. Son reports that patient is usually clean and organized but when son arrived at apartment, son reports it was dirty and rotten food was everywhere. Son could not find patient and called the police who  had brought patient to ED.    Son reports that last year patient was living with his girlfriend. Son reports that patient was "slowly going mad". Son gives examples that patient was stalking [employer] representative workers, gave away over $40,000 of his savings to strangers, went to [employer] and told customers that he owned [employer] and brought strangers cars, abandoned his car in [place] and stayed at random hotels and became paranoid and told family he had a private plane to fly family to the [place]. Girlfriend kicked patient out of house and patient was admitted to [facility] and then sent to [facility]. Son reports that patient never followed up with a psychiatrist and is worried about dc plans.    Son went on to further explain that patient has different  personalities. Patient reported to son and to CSW that he has [place] 1, [place] 2 and [place]. Patient told son that Marthann Schiller 1 had taken over his body that past year and Valor was going to come back and be his dad.    Son feels inpatient treatment is needed and that patient needs in-house follow up when released. CSW encouraged family to apply for Medicaid on patient's behalf and explained that more MH services are available with Medicaid. If patient could get approved for Medicaid, ACT services could benefit patient. CSW explained that psych MD is recommending inpatient treatment.  Son is unsure about patient's substance use but reports that patient never had any MH concerns until about the past year.    Per MD, patient should be medically stable to dc after EEG. CSW needs clarification if psych MD requests that patient be placed under IVC if he continues to refuse. Once medically stable, CSW will search for placement.   Disposition:  Inpatient referral made [facility] [place])

## 2012-12-24 NOTE — Progress Notes (Signed)
EEG completed.

## 2012-12-25 ENCOUNTER — Encounter (HOSPITAL_COMMUNITY): Payer: Self-pay | Admitting: *Deleted

## 2012-12-25 ENCOUNTER — Inpatient Hospital Stay (HOSPITAL_COMMUNITY)
Admission: AD | Admit: 2012-12-25 | Discharge: 2013-01-21 | DRG: 885 | Payer: No Typology Code available for payment source | Source: Intra-hospital | Attending: Psychiatry | Admitting: Psychiatry

## 2012-12-25 DIAGNOSIS — I10 Essential (primary) hypertension: Secondary | ICD-10-CM | POA: Diagnosis present

## 2012-12-25 DIAGNOSIS — R609 Edema, unspecified: Secondary | ICD-10-CM | POA: Diagnosis present

## 2012-12-25 DIAGNOSIS — R600 Localized edema: Secondary | ICD-10-CM

## 2012-12-25 DIAGNOSIS — L97509 Non-pressure chronic ulcer of other part of unspecified foot with unspecified severity: Secondary | ICD-10-CM | POA: Diagnosis present

## 2012-12-25 DIAGNOSIS — Z79899 Other long term (current) drug therapy: Secondary | ICD-10-CM

## 2012-12-25 DIAGNOSIS — E871 Hypo-osmolality and hyponatremia: Secondary | ICD-10-CM

## 2012-12-25 DIAGNOSIS — F312 Bipolar disorder, current episode manic severe with psychotic features: Principal | ICD-10-CM | POA: Diagnosis present

## 2012-12-25 DIAGNOSIS — F54 Psychological and behavioral factors associated with disorders or diseases classified elsewhere: Secondary | ICD-10-CM | POA: Diagnosis present

## 2012-12-25 DIAGNOSIS — F319 Bipolar disorder, unspecified: Secondary | ICD-10-CM

## 2012-12-25 DIAGNOSIS — L97519 Non-pressure chronic ulcer of other part of right foot with unspecified severity: Secondary | ICD-10-CM

## 2012-12-25 DIAGNOSIS — E119 Type 2 diabetes mellitus without complications: Secondary | ICD-10-CM | POA: Diagnosis present

## 2012-12-25 DIAGNOSIS — R404 Transient alteration of awareness: Secondary | ICD-10-CM

## 2012-12-25 DIAGNOSIS — F3110 Bipolar disorder, current episode manic without psychotic features, unspecified: Secondary | ICD-10-CM

## 2012-12-25 DIAGNOSIS — R631 Polydipsia: Secondary | ICD-10-CM | POA: Diagnosis present

## 2012-12-25 LAB — GLUCOSE, CAPILLARY: Glucose-Capillary: 79 mg/dL (ref 70–99)

## 2012-12-25 LAB — HEMOGLOBIN A1C: Hgb A1c MFr Bld: 5.7 % — ABNORMAL HIGH (ref <5.7)

## 2012-12-25 MED ORDER — SIMVASTATIN 5 MG PO TABS
5.0000 mg | ORAL_TABLET | Freq: Every day | ORAL | Status: DC
  Administered 2012-12-26 – 2013-01-20 (×26): 5 mg via ORAL
  Filled 2012-12-25 (×27): qty 1

## 2012-12-25 MED ORDER — RISPERIDONE 0.5 MG PO TABS
0.5000 mg | ORAL_TABLET | Freq: Every day | ORAL | Status: AC
  Administered 2012-12-25: 0.5 mg via ORAL
  Filled 2012-12-25 (×2): qty 1

## 2012-12-25 MED ORDER — RISPERIDONE 0.5 MG PO TABS: 1.5000 mg | ORAL_TABLET | Freq: Every day | ORAL | Status: DC

## 2012-12-25 MED ORDER — CLONIDINE HCL 0.2 MG PO TABS
0.2000 mg | ORAL_TABLET | ORAL | Status: AC
  Administered 2012-12-25: 0.2 mg via ORAL
  Filled 2012-12-25: qty 1

## 2012-12-25 MED ORDER — MAGNESIUM HYDROXIDE 400 MG/5ML PO SUSP: 30.0000 mL | Freq: Every day | ORAL | Status: DC | PRN

## 2012-12-25 MED ORDER — RISPERIDONE 1 MG PO TABS
1.5000 mg | ORAL_TABLET | Freq: Every day | ORAL | Status: DC
  Administered 2012-12-26: 1.5 mg via ORAL
  Filled 2012-12-25 (×2): qty 1
  Filled 2012-12-25: qty 3

## 2012-12-25 MED ORDER — LORAZEPAM 2 MG/ML IJ SOLN
2.0000 mg | Freq: Once | INTRAMUSCULAR | Status: AC
  Administered 2012-12-25: 2 mg via INTRAVENOUS

## 2012-12-25 MED ORDER — LISINOPRIL 10 MG PO TABS
10.0000 mg | ORAL_TABLET | Freq: Every day | ORAL | Status: DC
  Administered 2012-12-26 – 2013-01-21 (×24): 10 mg via ORAL
  Filled 2012-12-25 (×31): qty 1

## 2012-12-25 MED ORDER — LISINOPRIL 10 MG PO TABS: 10.0000 mg | ORAL_TABLET | Freq: Every day | ORAL | Status: DC

## 2012-12-25 MED ORDER — RISPERIDONE 1 MG PO TABS: 1.5000 mg | ORAL_TABLET | Freq: Every day | ORAL | Status: DC

## 2012-12-25 MED ORDER — ACETAMINOPHEN 325 MG PO TABS
650.0000 mg | ORAL_TABLET | Freq: Four times a day (QID) | ORAL | Status: DC | PRN
  Administered 2013-01-08 – 2013-01-10 (×2): 650 mg via ORAL

## 2012-12-25 MED ORDER — ALUM & MAG HYDROXIDE-SIMETH 200-200-20 MG/5ML PO SUSP
30.0000 mL | ORAL | Status: DC | PRN
  Administered 2012-12-29: 30 mL via ORAL

## 2012-12-25 NOTE — Procedures (Signed)
EEG NUMBER:  [id number].  REFERRING PHYSICIAN:  Pleas Koch, MD  INDICATION FOR STUDY:  A 67 year old man who presented with delirium as well as history of episodes of generalized stiffness and inability to speak transiently.  Study is being performed to rule out possible focal seizure disorder.  DESCRIPTION OF PROCEDURE:  This is a routine EEG recording performed during wakefulness.  The predominant background activity consisted of 9 Hz symmetrical alpha rhythm, which attenuated well with eye opening. Low-amplitude beta activity was recorded from the frontal and central regions.  Photic stimulation was not performed.  Hyperventilation produced mild generalized transient slowing response.  No epileptiform discharges were recorded.  There were no areas of abnormal slowing.  INTERPRETATION:  This is a normal EEG recording during wakefulness.  No evidence of an epileptic disorder was demonstrated.     Noel Christmas, MD    ZO:[id number] D:  12/24/2012 13:27:11  T:  12/25/2012 02:44:35  Job #:  [id number]

## 2012-12-25 NOTE — Progress Notes (Signed)
Clinical Social Work  Patient accepted to [facility] bed 406-2. Patient has been served by [facility] and will be transported by [facility] once [facility] is ready to accept. [facility] stated admission will occur today but waiting on room to be available. CSW called son and informed him of dc plans. CSW informed MD and RN and will coordinate transportation via [employer] when [facility] ready.  [place], [place] [phone]

## 2012-12-25 NOTE — Progress Notes (Signed)
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 He is very pleasant and asking me how my day was.  No further seizure or seizure like episodes.   OBJECTIVE:                                                                                                                           Vital signs in last 24 hours: Temp:  [97.8 F (36.6 C)-98.3 F (36.8 C)] 98.3 F (36.8 C) (04/08 0600) Pulse Rate:  [78-101] 101 (04/08 0600) Resp:  [18-20] 20 (04/08 0600) BP: (113-162)/(64-92) 154/92 mmHg (04/08 0600) SpO2:  [98 %-99 %] 99 % (04/08 0600) Weight:  [95.074 kg (209 lb 9.6 oz)] 95.074 kg (209 lb 9.6 oz) (04/08 0500)  Intake/Output from previous day: 04/07 0701 - 04/08 0700 In: 1080 [P.O.:1080] Out: -  Intake/Output this shift:   Nutritional status: Carb Control  Past Medical History  Diagnosis Date  . Mental disorder   . PTSD (post-traumatic stress disorder)   . Bipolar 2 disorder   . Schizophrenia   . Hypertension   . High cholesterol   . Diabetes mellitus without complication     Neurologic Exam:  Mental Status: Alert, oriented, thought content appropriate.  Speech fluent without evidence of aphasia.  Able to follow 3 step commands without difficulty. Cranial Nerves: II: Discs flat bilaterally; Visual fields grossly normal, pupils equal, round, reactive to light and accommodation III,IV, VI: ptosis not present, extra-ocular motions intact bilaterally V,VII: smile symmetric, facial light touch sensation normal bilaterally VIII: hearing normal bilaterally IX,X: gag reflex present XI: bilateral shoulder shrug XII: midline tongue extension Motor: Right : Upper extremity   5/5    Left:     Upper extremity   5/5  Lower extremity   5/5     Lower  extremity   5/5 Tone and bulk:normal tone throughout; no atrophy noted Sensory: Pinprick and light touch intact throughout, bilaterally Deep Tendon Reflexes: 2+ and symmetric throughout Plantars: Right: downgoing  Left: downgoing Cerebellar: normal finger-to-nose,  normal heel-to-shin test CV: pulses palpable throughout    Lab Results: No results found for this basename: cbc, bmp, coags, chol, tri, ldl, hga1c   Lipid Panel No results found for this basename: CHOL, TRIG, HDL, CHOLHDL, VLDL, LDLCALC,  in the last 72 hours  Studies/Results: No results found.  MEDICATIONS                                                                                                                        Scheduled: . enoxaparin (LOVENOX) injection  40 mg Subcutaneous Q24H  . lisinopril  10 mg Oral Daily  . risperiDONE  1 mg Oral Daily    ASSESSMENT/PLAN:                                                                                                            Etiology for patient's spells remains unclear. New-onset partial seizure disorder is unlikely. EEG has been done and was normal showing no epileptiform activity. No anticonvulsant medication is recommended at this point.  Neurology will S/O   Assessment and plan discussed with with attending physician and they are in agreement.    Felicie Morn PA-C [facility] [phone]  12/25/2012, 9:08 AM

## 2012-12-25 NOTE — BH Assessment (Signed)
[facility] Assessment Progress Note      Consulted with Serena Colonel NP re this medical unit patient being transferred to the [facility] unit. She accepted him to a 400 hall bed when one becomes available.

## 2012-12-25 NOTE — Progress Notes (Signed)
Clinical Social Work Progress Note PSYCHIATRY SERVICE LINE 12/25/2012  Patient:  Albert Hess  Account:  [id number]  Admit Date:  12/21/2012  Clinical Social Worker:  Unk Lightning, LCSW  Date/Time:  12/25/2012 11:00 AM  Review of Patient  Overall Medical Condition:   MD reports that patient is medically stable to dc to inpatient psychiatric facility.   Participation Level:  Active  Participation Quality  Appropriate   Other Participation Quality:   Patient has pressured speech and pacing throughout session   Affect  Excited   Cognitive  Delusional   Reaction to Medications/Concerns:   None reported   Modes of Intervention  Support  Behaviors/Psychosis   Summary of Progress/Plan at Discharge   CSW met with patient in his room. Patient standing at his door and reports that everyone must knock on his door even if it is open because it is a tradition. Patient continues to pace throughout session and remains delusional. Patient tells CSW stories of going to hell and putting a dagger in the devil's heart. Patient reports he has also been to heaven and talks with God. Patient believes that he makes things with his happen with his mind. Patient talks about being in other's people's minds and being able to control them.    CSW reviewed chart but IVC was never served by [employer]. CSW spoke with Magistrate who reported paperwork would need to be sent again. IVC faxed paperwork and Magistrate assured CSW that [employer] would serve patient today.    CSW sent referrals to the following facilities:    [facility]-reviewing referral  [facility]-no beds  [facility]-reviewing referral  [facility]-reviewing referral  [facility]- patient denied  [facility]- CSW left message    CSW will continue to follow to assist with placement.

## 2012-12-25 NOTE — Progress Notes (Signed)
Gave report to [facility] at [facility]. Gave number to call back if she had any additional questions.

## 2012-12-25 NOTE — Discharge Summary (Addendum)
Physician Discharge Summary  Albert Hess ZOX:[id number] DOB: 10-17-1945 DOA: 12/21/2012  PCP: Bradd Burner, PA-C  Admit date: 12/21/2012 Discharge date: 12/25/2012  Time spent: 25 min  Recommendations for Outpatient Follow-up:  1. Needs further psychiatric in-patient management 2. Needs intensive psychotherapy 3. Recommend Bmet in 4-5 days and moderate fluid restriction of 1200-1500 cc/24 hr   Discharge Diagnoses:  Active Problems:   Bipolar I disorder, most recent episode (or current) unspecified   Transient alteration of awareness   Discharge Condition: Stable but agitated  Diet recommendation: regular  Filed Weights   12/21/12 1959 12/23/12 0500 12/25/12 0500  Weight: 94.9 kg (209 lb 3.5 oz) 95.8 kg (211 lb 3.2 oz) 95.074 kg (209 lb 9.6 oz)    History of present illness:  67 yr old male admitted to [facility] 4.4.14 with Hallucination and delusional behavior in a setting of Hyponatramia He experienced significant pyschomotor agitation and it was thought he had primarily manic episodes ina s etting of bipolar.  See below  Hospital Course:  1. 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 Will hold Thiazide on d/c. Continue fluid restriction maybe to about 1200 -1500 cc per day om discharge 2. Extremely unlikely Seizure disorder-noted an episode of agitation 12/22/12-Appreciate Neurology symptoms do not correlate with any spectrum of seizure disorder. EEG performed showed no specific Sz activity.  Patient cleared medically from psychiatric standpoint  3. Potential PTSD-patient suicidal in the past per chart review. Appreciate psychiatry input-Continue Risperdal 1 mg daily. Continue sitter 4. Htn-poorly controlled--add back lisinopril at dose of 10 mg 5. Possible DM-has been on metformin in the past-will need consideration on feasibility of him taking this ina  Long term setting.  Blood sugars in hospital all below 200 6. Ho Cannabis  use-noted on tox screen  Chart reviewed as below-  ED assessment 2.16.13 with delusions-aggressive[facility]    Consultants:  Psychiatry  Procedures:  none  Antibiotics:  none  Discharge Exam: Filed Vitals:   12/24/12 1533 12/24/12 2200 12/25/12 0500 12/25/12 0600  BP: 113/64 162/86  154/92  Pulse: 80 78  101  Temp: 97.8 F (36.6 C) 97.8 F (36.6 C)  98.3 F (36.8 C)  TempSrc: Oral Oral  Oral  Resp: 18 20  20   Height:      Weight:   95.074 kg (209 lb 9.6 oz)   SpO2: 98% 99%  99%   Agitated today.  Packing bags. Seems ready to go somewhere.  Not hearing voices but seems to be responding to some internal stimuli Very pressures speech  General: agiated Cardiovascular: s1 s2 no m/r/g Respiratory:  clear  Discharge Instructions     Medication List    STOP taking these medications       lisinopril-hydrochlorothiazide 20-25 MG per tablet  Commonly known as:  PRINZIDE,ZESTORETIC      TAKE these medications       lisinopril 10 MG tablet  Commonly known as:  PRINIVIL,ZESTRIL  Take 1 tablet (10 mg total) by mouth daily.     pravastatin 40 MG tablet  Commonly known as:  PRAVACHOL  Take 40 mg by mouth daily.     risperiDONE 0.5 MG tablet  Commonly known as:  RISPERDAL  Take 3 tablets (1.5 mg total) by mouth daily.  Start taking on:  12/26/2012          The results of significant diagnostics from this hospitalization (including imaging, microbiology, ancillary and laboratory) are listed below for  reference.    Significant Diagnostic Studies: No results found.  Microbiology: No results found for this or any previous visit (from the past 240 hour(s)).   Labs: Basic Metabolic Panel:  Recent Labs Lab 12/21/12 1323 12/22/12 0523 12/23/12 0532 12/24/12 0455 12/24/12 1336  NA 119* 128* 125* 131* 132*  K 4.7 4.7 4.6 4.7 4.6  CL 86* 96 94* 100 99  CO2 22 23 25 23 26   GLUCOSE 105* 93 88 97 116*  BUN 21 13 12 14 13   CREATININE 0.90  0.70 0.78 0.80 0.87  CALCIUM 9.2 9.4 9.2 9.0 9.0   Liver Function Tests:  Recent Labs Lab 12/21/12 1323  AST 31  ALT 19  ALKPHOS 53  BILITOT 0.5  PROT 6.7  ALBUMIN 4.0   No results found for this basename: LIPASE, AMYLASE,  in the last 168 hours No results found for this basename: AMMONIA,  in the last 168 hours CBC:  Recent Labs Lab 12/21/12 1323 12/22/12 0523  WBC 4.9 3.6*  HGB 12.0* 11.9*  HCT 32.6* 32.4*  MCV 91.1 90.8  PLT 156 149*   Cardiac Enzymes: No results found for this basename: CKTOTAL, CKMB, CKMBINDEX, TROPONINI,  in the last 168 hours BNP: BNP (last 3 results) No results found for this basename: PROBNP,  in the last 8760 hours CBG:  Recent Labs Lab 12/21/12 1307  GLUCAP 110*       SignedRhetta Mura  [facility] 12/25/2012, 1:05 PM

## 2012-12-25 NOTE — Tx Team (Signed)
Initial Interdisciplinary Treatment Plan  PATIENT STRENGTHS: (choose at least two) Active sense of humor Average or above average intelligence Capable of independent living Communication skills General fund of knowledge Physical Health Special hobby/interest Supportive family/friends Work skills  PATIENT STRESSORS: Marital or family conflict Substance abuse   PROBLEM LIST: Problem List/Patient Goals Date to be addressed Date deferred Reason deferred Estimated date of resolution  "I have no idea why I'm here" 12/25/12     "I wanna be a better person than I was" 12/25/12           Increased risk for suicide 12/25/12     Substance abuse 12/25/12                              DISCHARGE CRITERIA:  Ability to meet basic life and health needs Adequate post-discharge living arrangements Improved stabilization in mood, thinking, and/or behavior Medical problems require only outpatient monitoring Motivation to continue treatment in a less acute level of care Need for constant or close observation no longer present Reduction of life-threatening or endangering symptoms to within safe limits Safe-care adequate arrangements made Verbal commitment to aftercare and medication compliance Withdrawal symptoms are absent or subacute and managed without 24-hour nursing intervention  PRELIMINARY DISCHARGE PLAN: Attend aftercare/continuing care group Attend PHP/IOP Attend 12-step recovery group Outpatient therapy Participate in family therapy Return to previous living arrangement  PATIENT/FAMIILY INVOLVEMENT: This treatment plan has been presented to and reviewed with the patient, Albert Hess, and/or family member.  The patient and family have been given the opportunity to ask questions and make suggestions.  Albert Hess 12/25/2012, 10:02 PM

## 2012-12-25 NOTE — Progress Notes (Signed)
Patient has bed prepared at [facility] 406-2. CSW called [employer] for transport. CSW also called patient's son, [name], and informed of transport.  Taren Toops C. Anyely Cunning MSW, LCSW [phone]

## 2012-12-26 DIAGNOSIS — F312 Bipolar disorder, current episode manic severe with psychotic features: Principal | ICD-10-CM

## 2012-12-26 DIAGNOSIS — E119 Type 2 diabetes mellitus without complications: Secondary | ICD-10-CM | POA: Diagnosis present

## 2012-12-26 DIAGNOSIS — E871 Hypo-osmolality and hyponatremia: Secondary | ICD-10-CM | POA: Diagnosis present

## 2012-12-26 DIAGNOSIS — F3110 Bipolar disorder, current episode manic without psychotic features, unspecified: Secondary | ICD-10-CM | POA: Diagnosis present

## 2012-12-26 DIAGNOSIS — F54 Psychological and behavioral factors associated with disorders or diseases classified elsewhere: Secondary | ICD-10-CM | POA: Diagnosis present

## 2012-12-26 LAB — COMPREHENSIVE METABOLIC PANEL
ALT: 15 U/L (ref 0–53)
AST: 20 U/L (ref 0–37)
Alkaline Phosphatase: 45 U/L (ref 39–117)
CO2: 24 mEq/L (ref 19–32)
Chloride: 96 mEq/L (ref 96–112)
Creatinine, Ser: 0.7 mg/dL (ref 0.50–1.35)
GFR calc non Af Amer: >90 mL/min (ref >90)
Potassium: 5.1 mEq/L (ref 3.5–5.1)
Total Bilirubin: 0.6 mg/dL (ref 0.3–1.2)

## 2012-12-26 LAB — GLUCOSE, CAPILLARY
Glucose-Capillary: 88 mg/dL (ref 70–99)
Glucose-Capillary: 103 mg/dL — ABNORMAL HIGH (ref 70–99)
Glucose-Capillary: 112 mg/dL — ABNORMAL HIGH (ref 70–99)

## 2012-12-26 LAB — CBC
HCT: 31.7 % — ABNORMAL LOW (ref 39.0–52.0)
MCV: 92.2 fL (ref 78.0–100.0)
Platelets: 145 10*3/uL — ABNORMAL LOW (ref 150–400)
RBC: 3.44 MIL/uL — ABNORMAL LOW (ref 4.22–5.81)
WBC: 3.5 10*3/uL — ABNORMAL LOW (ref 4.0–10.5)

## 2012-12-26 MED ORDER — RISPERIDONE 1 MG PO TABS
1.0000 mg | ORAL_TABLET | Freq: Two times a day (BID) | ORAL | Status: DC
  Administered 2012-12-26 – 2013-01-01 (×12): 1 mg via ORAL
  Filled 2012-12-26 (×18): qty 1

## 2012-12-26 MED ORDER — TRAZODONE HCL 50 MG PO TABS
50.0000 mg | ORAL_TABLET | Freq: Every evening | ORAL | Status: DC | PRN
  Administered 2012-12-26 – 2012-12-31 (×6): 50 mg via ORAL
  Filled 2012-12-26 (×6): qty 1

## 2012-12-26 NOTE — H&P (Signed)
Psychiatric Admission Assessment Adult  Patient Identification:  Albert Hess Date of Evaluation:  12/26/2012  Chief Complaint:  "I don't know why I am here but I think I am a psycho, don't you know that already?"  History of Present Illness::Patient is a 67 year old man, [place] veteran who was honorably discharged from the [employer] but a poor historian who reports prior history of PTSD, Schizophrenia and Bipolar. Patient presents with psychosis, suicidal thinking with no plan, paranoia, mood lability, nightmares, flashbacks, hypervigilant and grandiose delusions;"I own most of [place] and pretty soon I am going to change the skyline".  Elements:  Location:  [facility] inpatient service. Quality:  presents with severe manic episode, psychosis and delusions. Severity:  severe. Timing:  reporst smoking marijuana and non-compliant with his medications. Duration:  symptoms began few days ago.. Context:  smoking marijuana and possible medication non compliant. Associated Signs/Synptoms: Depression Symptoms:  psychomotor agitation, impaired memory, suicidal thoughts without plan, insomnia, (Hypo) Manic Symptoms:  Elevated Mood, Flight of Ideas, Licensed conveyancer, Grandiosity, Hallucinations, Impulsivity, Irritable Mood, Labiality of Mood, Anxiety Symptoms:  denies Psychotic Symptoms:  Delusions, Hallucinations: Auditory Paranoia, PTSD Symptoms: Had a traumatic exposure:  [place] veteran  Psychiatric Specialty Exam: Physical Exam  Psychiatric: His affect is angry, labile and inappropriate. His speech is rapid and/or pressured. He is agitated, aggressive and actively hallucinating. Thought content is paranoid and delusional. He expresses impulsivity and inappropriate judgment. He expresses suicidal ideation. He exhibits abnormal recent memory. He is inattentive.    Review of Systems  Constitutional: Negative.   HENT: Negative.   Eyes: Negative.   Respiratory: Negative.    Cardiovascular: Negative.   Gastrointestinal: Negative.   Genitourinary: Negative.   Skin: Negative.   Neurological: Negative.   Endo/Heme/Allergies: Negative.   Psychiatric/Behavioral: Positive for suicidal ideas, hallucinations and substance abuse. The patient has insomnia.     Blood pressure 102/70, pulse 105, temperature 97.2 F (36.2 C), temperature source Oral, resp. rate 20, height 5' 10.25" (1.784 m), weight 97.523 kg (215 lb).Body mass index is 30.64 kg/(m^2).  General Appearance: Bizarre and Disheveled  Eye Contact::  Minimal  Speech:  Pressured and loud  Volume:  Increased  Mood:  Irritable  Affect:  Labile and Full Range  Thought Process:  Disorganized and Tangential  Orientation:  Other:  refused to answer  Thought Content:  Delusions, Hallucinations: Auditory and Paranoid Ideation  Suicidal Thoughts:  Yes.  without intent/plan  Homicidal Thoughts:  No  Memory:  Remote;   Fair  Judgement:  Poor  Insight:  Lacking  Psychomotor Activity:  Increased  Concentration:  Poor  Recall:  Fair  Akathisia:  No  Handed:  Right  AIMS (if indicated):     Assets:  Social Support  Sleep:       Past Psychiatric History: Diagnosis:Bipolar, Schizophrenia, PTSD  Hospitalizations:  Outpatient Care:  Substance Abuse Care:  Self-Mutilation:  Suicidal Attempts: "I tried to kill myself when I came back from [place]"  Violent Behaviors:   Past Medical History:   Past Medical History  Diagnosis Date  . Mental disorder   . PTSD (post-traumatic stress disorder)   . Bipolar 2 disorder   . Schizophrenia   . Hypertension   . High cholesterol   . Diabetes mellitus without complication     Allergies:  No Known Allergies PTA Medications: Prescriptions prior to admission  Medication Sig Dispense Refill  . lisinopril (PRINIVIL,ZESTRIL) 10 MG tablet Take 1 tablet (10 mg total) by mouth daily.      Marland Kitchen  pravastatin (PRAVACHOL) 40 MG tablet Take 40 mg by mouth daily.      .  risperiDONE (RISPERDAL) 0.5 MG tablet Take 3 tablets (1.5 mg total) by mouth daily.        Previous Psychotropic Medications:  Medication/Dose                 Substance Abuse History in the last 12 months:  yes  Consequences of Substance Abuse: Negative  Social History:  reports that he has been smoking Cigarettes.  He has been smoking about 0.00 packs per day. He does not have any smokeless tobacco history on file. He reports that  drinks alcohol. He reports that he uses illicit drugs (Marijuana). Additional Social History: Pain Medications: none History of alcohol / drug use?: Yes Longest period of sobriety (when/how long): never Withdrawal Symptoms:  (no withdrawals, hasnt been sober)   Name of Substance 2: THC 2 - Age of First Use: 20 2 - Amount (size/oz): varies 2 - Frequency: varies 2 - Duration: present 2 - Last Use / Amount: 2 days ago                Current Place of Residence:   Place of Birth:   Family Members: Marital Status:  Divorced Children:  Sons:  Daughters: Relationships: Education:  Corporate treasurer Problems/Performance: Religious Beliefs/Practices: History of Abuse (Emotional/Phsycial/Sexual) Occupational Experiences; Military History:  None. Legal History: Hobbies/Interests:  Family History:  History reviewed. No pertinent family history.  Results for orders placed during the hospital encounter of 12/25/12 (from the past 72 hour(s))  GLUCOSE, CAPILLARY     Status: None   Collection Time    12/25/12 10:25 PM      Result Value Range   Glucose-Capillary 79  70 - 99 mg/dL  CBC     Status: Abnormal   Collection Time    12/26/12  6:15 AM      Result Value Range   WBC 3.5 (*) 4.0 - 10.5 K/uL   RBC 3.44 (*) 4.22 - 5.81 MIL/uL   Hemoglobin 11.6 (*) 13.0 - 17.0 g/dL   HCT 40.9 (*) 81.1 - 91.4 %   MCV 92.2  78.0 - 100.0 fL   MCH 33.7  26.0 - 34.0 pg   MCHC 36.6 (*) 30.0 - 36.0 g/dL   Comment: PRE-WARMING TECHNIQUE USED   RDW  11.6  11.5 - 15.5 %   Platelets 145 (*) 150 - 400 K/uL  COMPREHENSIVE METABOLIC PANEL     Status: Abnormal   Collection Time    12/26/12  6:15 AM      Result Value Range   Sodium 129 (*) 135 - 145 mEq/L   Potassium 5.1  3.5 - 5.1 mEq/L   Chloride 96  96 - 112 mEq/L   CO2 24  19 - 32 mEq/L   Glucose, Bld 86  70 - 99 mg/dL   BUN 14  6 - 23 mg/dL   Creatinine, Ser 7.82  0.50 - 1.35 mg/dL   Calcium 9.3  8.4 - 95.6 mg/dL   Total Protein 6.0  6.0 - 8.3 g/dL   Albumin 3.4 (*) 3.5 - 5.2 g/dL   AST 20  0 - 37 U/L   ALT 15  0 - 53 U/L   Alkaline Phosphatase 45  39 - 117 U/L   Total Bilirubin 0.6  0.3 - 1.2 mg/dL   GFR calc non Af Amer >90  >90 mL/min   GFR calc Af Amer >90  >90  mL/min   Comment:            The eGFR has been calculated     using the CKD EPI equation.     This calculation has not been     validated in all clinical     situations.     eGFR's persistently     <90 mL/min signify     possible Chronic Kidney Disease.  MAGNESIUM     Status: None   Collection Time    12/26/12  6:15 AM      Result Value Range   Magnesium 1.8  1.5 - 2.5 mg/dL  GLUCOSE, CAPILLARY     Status: None   Collection Time    12/26/12  6:18 AM      Result Value Range   Glucose-Capillary 88  70 - 99 mg/dL   Psychological Evaluations:  Assessment:   AXIS I:  Bipolar 1 disorder manic episode with psychosis. History Post Traumatic Stress Disorder. Cannabis abuse  AXIS II:  Deferred AXIS III:   Past Medical History  Diagnosis Date  . Hyponatremia   . PTSD (post-traumatic stress disorder)   . Bipolar 2 disorder   . Schizophrenia   . Hypertension   . High cholesterol   . Diabetes mellitus without complication    AXIS IV:  other psychosocial or environmental problems and problems related to social environment AXIS V:  21-30 behavior considerably influenced by delusions or hallucinations OR serious impairment in judgment, communication OR inability to function in almost all areas  Treatment  Plan/Recommendations: 1. Admit for crisis management and stabilization. 2. Medication management to reduce current symptoms to base line and improve the  patient's overall level of functioning 3. Treat health problems as indicated. 4. Develop treatment plan to decrease risk of relapse upon discharge and the need for readmission. 5. Psycho-social education regarding relapse prevention and self care. 6. Health care follow up as needed for medical problems. 7. Restart home medications where appropriate.   Treatment Plan Summary: Daily contact with patient to assess and evaluate symptoms and progress in treatment Medication management Current Medications:  Current Facility-Administered Medications  Medication Dose Route Frequency Provider Last Rate Last Dose  . acetaminophen (TYLENOL) tablet 650 mg  650 mg Oral Q6H PRN Kerry Hough, PA-C      . alum & mag hydroxide-simeth (MAALOX/MYLANTA) 200-200-20 MG/5ML suspension 30 mL  30 mL Oral Q4H PRN Kerry Hough, PA-C      . lisinopril (PRINIVIL,ZESTRIL) tablet 10 mg  10 mg Oral Daily Kerry Hough, PA-C   10 mg at 12/26/12 4782  . magnesium hydroxide (MILK OF MAGNESIA) suspension 30 mL  30 mL Oral Daily PRN Kerry Hough, PA-C      . risperiDONE (RISPERDAL) tablet 1.5 mg  1.5 mg Oral Daily Kerry Hough, PA-C   1.5 mg at 12/26/12 9562  . simvastatin (ZOCOR) tablet 5 mg  5 mg Oral q1800 Kerry Hough, PA-C        Observation Level/Precautions:  routine  Laboratory:  routine  Psychotherapy:    Medications:    Consultations:    Discharge Concerns:    Estimated LOS:  Other:     I certify that inpatient services furnished can reasonably be expected to improve the patient's condition.   Leticia Penna 4/9/201411:02 AM

## 2012-12-26 NOTE — Progress Notes (Signed)
The focus of this group is to help patients review their daily goal of treatment and discuss progress on daily workbooks. Pt attended the evening group session and responded to all discussion prompts from the Writer. Pt reported that he had a good day and that he "went to lots of groups and learned lots of stuff." Pt appeared to present with tangential thinking as his prompt responses were very off topic. Pt smiled throughout group and and was pleasant in his responses.

## 2012-12-26 NOTE — Progress Notes (Signed)
Pt was pleasant, cooperative and humorous, but slightly anxious during the adm process. Pt was hyper verbal and religiously preoccupied. At times pt referred to himself as God, and quote scriptures from the bible. When asked the circumstance surrounding his adm pt stated, "I don't know why I'm here". Writer read info that was reported. When writer mentioned pt was found wandering, pt stated, "Yeah, because I walk everywhere I go. I wasn't wandering, I was walking".  Writer observed pt's gait and asked him if he uses a cane or walker at home to help with ambulation. Pt stated that he used to have a cane, but his girlfriend "got rid of it". Stated he very seldom used the cane, when he had it. Writer escorted pt to his room and had the [facility] on duty come for assist with the assessment. Pt acknowledged understanding of fall precautions and showed staff how he's to ambulate from bed to standing. Pt voiced no questions or concerns.

## 2012-12-26 NOTE — Progress Notes (Signed)
Adult Psychoeducational Group Note  Date:  12/26/2012 Time:  12:06 PM  Group Topic/Focus:  Crisis Planning:   The purpose of this group is to help patients create a crisis plan for use upon discharge or in the future, as needed.  Participation Level:  Minimal  Participation Quality:  Inattentive, Monopolizing and Redirectable  Affect:  Excited, Flat and Irritable  Cognitive:  Confused and Lacking  Insight: Lacking  Engagement in Group:  Lacking and Off Topic  Modes of Intervention:  Education and Limit-setting  Additional Comments:  During icebreaker activity, pt began sharing tangential information concerning the effects of agent orange exposure on his body. Pt began to get agitated and expressed that he "can take anybody in the whole world." Staff redirected pt from this agitated tangent. Pt did not share any more during this group session.  Reinaldo Raddle K 12/26/2012, 12:06 PM

## 2012-12-26 NOTE — Progress Notes (Signed)
Observation note: pt is on 1:1 observation for high fall risk, fluid restriction of 1200 ml  q shift and I & O observation. Pt is made aware of these new orders. Pt is in dayroom with sitter present. Pt do not appear to be in distress. Pt denies pain. Pt continues to present with delusional stories about owning everything in [place] and reports that he hear voices. Pt presents with flight of ideas and tangential speech. Pt can be argumentive at times. Pt is  compliant with treatment. Medications administered as ordered per MD. Verbal support given. 15 minute checks performed for safety. 1:1 observation maintain until d/c'd.

## 2012-12-26 NOTE — Consult Note (Signed)
[facility] Consult Note History and Physical  Albert Hess JXB:[id number] DOB: 1946/07/31 DOA: 12/25/2012  Referring physician: Dr. Thedore Mins Reason for consultation: Hyponatremia PCP: Bradd Burner, PA-C   Chief Complaint: None.   History of Present Illness: Albert Hess is an 67 y.o. male with a past medical history of bipolar 2 disorder, posttraumatic stress disorder, schizophrenia, hypertension, hypercholesterolemia and diabetes who was admitted to behavioral health on 12/26/2012. He is on Risperdal and lisinopril as well as Pravachol but no diuretics or SSRI therapy. Internal medicine has been asked to evaluate him for hyponatremia. The patient freely admits to drinking large quantities of fluids.  Patient's diabetes has been well-controlled since he experienced significant weight loss. His hemoglobin A1c was most recently 5.6%.  Review of Systems: Constitutional: No fever, no chills;  Appetite normal; No weight loss, no weight gain.  HEENT: No blurry vision, no diplopia, no pharyngitis, no dysphagia CV: No chest pain, no palpitations.  Resp: No SOB, no cough. GI: No nausea, no vomiting, no diarrhea, no melena, no hematochezia.  GU: No dysuria, no hematuria.  MSK: no myalgias, no arthralgias.  Neuro:  No headache, no focal neurological deficits, no history of seizures.  Psych: + depression, + anxiety.  Endo: No thyroid disease, + DM, no heat intolerance, no cold intolerance, no polyuria, no polydipsia  Skin: No rashes, no skin lesions.  Heme: No easy bruising, no history of blood diseases.  Past Medical History Past Medical History  Diagnosis Date  . Mental disorder   . PTSD (post-traumatic stress disorder)   . Bipolar 2 disorder   . Schizophrenia   . Hypertension   . High cholesterol   . Diabetes mellitus without complication      Past Surgical History Past Surgical History  Procedure Laterality Date  . Right side      pt points to R side for surgery      Social History: History   Social History  . Marital Status: Single    Spouse Name: N/A    Number of Children: N/A  . Years of Education: N/A   Occupational History  . Not on file.   Social History Main Topics  . Smoking status: Light Tobacco Smoker -- 0.00 packs/day    Types: Cigarettes  . Smokeless tobacco: Not on file  . Alcohol Use: Yes     Comment: 6 pkg weekly  . Drug Use: Yes    Special: Marijuana     Comment: "I take a hit in the morning to get me going"  . Sexually Active: No   Other Topics Concern  . Not on file   Social History Narrative  . No narrative on file    Family History:  History reviewed. No pertinent family history.  Allergies: Review of patient's allergies indicates no known allergies.  Meds: Prior to Admission medications   Medication Sig Start Date End Date Taking? Authorizing Provider  lisinopril (PRINIVIL,ZESTRIL) 10 MG tablet Take 1 tablet (10 mg total) by mouth daily. 12/25/12  Yes Rhetta Mura, MD  pravastatin (PRAVACHOL) 40 MG tablet Take 40 mg by mouth daily.   Yes Historical Provider, MD  risperiDONE (RISPERDAL) 0.5 MG tablet Take 3 tablets (1.5 mg total) by mouth daily. 12/26/12  Yes Rhetta Mura, MD    Physical Exam: Filed Vitals:   12/25/12 2117 12/25/12 2127 12/26/12 0700 12/26/12 0702  BP: 114/73 106/71 123/83 102/70  Pulse: 101 94 95 105  Temp:   97.2 F (36.2 C)  TempSrc:   Oral   Resp:   20   Height:      Weight:         Physical Exam: Blood pressure 102/70, pulse 105, temperature 97.2 F (36.2 C), temperature source Oral, resp. rate 20, height 5' 10.25" (1.784 m), weight 97.523 kg (215 lb). Gen: No acute distress. Head: Normocephalic, atraumatic. Eyes: PERRL, EOMI, sclerae nonicteric. Mouth: Oropharynx clear. Neck: Supple, no thyromegaly, no lymphadenopathy, no jugular venous distention. Chest: Lungs clear to auscultation bilaterally. CV: Heart sounds are regular. No murmurs, rubs, or  gallops. Abdomen: Soft, nontender, nondistended with normal active bowel sounds. Extremities: Extremities show some dry skin with cracking and edema. Skin: Warm and dry. 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 Neuro: Alert and oriented times 3; cranial nerves II through XII grossly intact. Psych: Mood and affect flat.  Labs on Admission:  Basic Metabolic Panel:  Recent Labs Lab 12/22/12 0523 12/23/12 0532 12/24/12 0455 12/24/12 1336 12/26/12 0615  NA 128* 125* 131* 132* 129*  K 4.7 4.6 4.7 4.6 5.1  CL 96 94* 100 99 96  CO2 23 25 23 26 24   GLUCOSE 93 88 97 116* 86  BUN 13 12 14 13 14   CREATININE 0.70 0.78 0.80 0.87 0.70  CALCIUM 9.4 9.2 9.0 9.0 9.3  MG  --   --   --   --  1.8   Liver Function Tests:  Recent Labs Lab 12/21/12 1323 12/26/12 0615  AST 31 20  ALT 19 15  ALKPHOS 53 45  BILITOT 0.5 0.6  PROT 6.7 6.0  ALBUMIN 4.0 3.4*   CBC:  Recent Labs Lab 12/21/12 1323 12/22/12 0523 12/26/12 0615  WBC 4.9 3.6* 3.5*  HGB 12.0* 11.9* 11.6*  HCT 32.6* 32.4* 31.7*  MCV 91.1 90.8 92.2  PLT 156 149* 145*   CBG:  Recent Labs Lab 12/21/12 1307 12/25/12 2225 12/26/12 0618 12/26/12 1142 12/26/12 1700  GLUCAP 110* 79 88 103* 112*    Radiological Exams on Admission: No results found.   Assessment/Plan Principal Problem:   Bipolar I disorder, most recent episode (or current) manic -Management per psychiatry. 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 -Recommend a 1200 cc per day fluid restriction. Can liberalize this to 1500 cc per day when sodium normalized. -Given the patient's free of knowledge meds of drinking excessive amounts of fluids, would not work this up further at this time. -The hyponatremia is mild and is not likely medically significant. I do not think he had a seizure related to his hyponatremia. -Please call us back  if the patient's hyponatremia gets worse on a fluid restriction.   Diabetes -Appears to be well controlled with a hemoglobin A1c of 5.6%.   Thank you for this consultation.  We will not follow the patient further. Please call for further questions.  Time spent: 1 hour.  RAMA,CHRISTINA [facility] Pager [phone]  If 7PM-7AM, please contact night-coverage [email or web address] Password [email or web address]

## 2012-12-26 NOTE — Progress Notes (Signed)
Recreation Therapy Notes  Date: 04.09.2014  Time: 9:30am  Location: [address] Day Room   Group Topic/Focus: Memory   Participation Level:  Active   Participation Quality:  Appropriate   Affect:  Euthymic   Cognitive:  Oriented   Additional Comments: Patient with peers played a "Catch the Balloon" a game that requires patients to keep a beach ball afloat. Each participant is required to state someone in the groups name as they tap the ball to keep it afloat. The person's name who is called is then responsible for keeping the ball afloat and stating another member of the group's name. The game continues until the ball hits the ground. Patient successfully kept the ball afloat, but forgot to name someone's name approximately 80% of the time. Patient played adapted "Memory." Game was played in 5 rounds. For 1st three rounds LRT held 4 cards in her hand, for last two rounds LRT held 5 cards to be identified. Each patient got to view the cards for 5 seconds. As a group patients were responsible for identifying the cards that were shown to them. Patient contributed answers to help group identify cards held by LRT. Patient actively participated in group session.   Marykay Lex Quincy Prisco, LRT/CTRS   Adlean Hardeman L 12/26/2012 2:20 PM
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
## 2012-12-26 NOTE — [facility] Group Notes (Signed)
[facility] LCSW Group Therapy  08/03/2012 12:00 PM  Type of Therapy:  Group Therapy  Participation Level:  Minimal  Participation Quality:  Inattentive  Affect:  Blunted  Cognitive:  Lacking  Insight:  Lacking  Engagement in Group:  Lacking  Engagement in Therapy:  Lacking  Modes of Intervention:  Discussion, Education, Socialization and Support  Summary of Progress/Problems: MHA: Patient was disengaged and inattentive throughout presentation by [facility] speaker's discussion of personal experience with mental illness and review of services offered by [facility]. Pt left group after 30 minutes and did not return.   Smart, Heather N 08/03/2012, 12:00 PM

## 2012-12-26 NOTE — Progress Notes (Signed)
Nutrition Brief Note  Patient identified on the Malnutrition Screening Tool (MST) Report  Body mass index is 30.64 kg/(m^2). Patient meets criteria for obesity grade 1 based on current BMI.   Current diet order is renal with 1200 ml FR, patient is consuming approximately 100% of meals at this time. Labs and medications reviewed.  Sodium improved and now at 129 which is increased from 119.  Patient is known to me from [facility] this [facility].  Patient eats very well.  UBW 224 lbs with current weight of 215 lbs.  States he was eating well at home (but son questioned recent intake).  States that he was walking a lot and tried to follow a diabetic diet.  Discussed with MD and PA.  Patient with need of 1200 ml fluid restriction but no need for renal diet at this time.  Diet order corrected to CHO MOD MED with a 1200 ml Fluid restriction.  No further nutrition interventions warranted at this time. If further nutrition issues arise, please consult RD.   Oran Rein, RD, LDN Clinical Inpatient Dietitian Pager:  [phone] Weekend and after hours pager:  [phone]

## 2012-12-26 NOTE — Tx Team (Signed)
  Interdisciplinary Treatment Plan Update   Date Reviewed:  12/26/2012  Time Reviewed:  10:24 AM  Progress in Treatment:   Attending groups: Yes Participating in groups: Yes Taking medication as prescribed: Yes  Tolerating medication: Yes Family/Significant other contact made: No  Patient understands diagnosis: No  Demonstrates minimal insight Discussing patient identified problems/goals with staff: Yes  See initial plan Medical problems stabilized or resolved: Yes Denies suicidal/homicidal ideation: No   Patient has not harmed self or others: Yes  For review of initial/current patient goals, please see plan of care.  Estimated Length of Stay:  4-5 days  Reason for Continuation of Hospitalization: Delusions  Hallucinations Mania Medication stabilization Suicidal ideation  New Problems/Goals identified:  N/A  Discharge Plan or Barriers:   unknown  Additional Comments: Patient delusional throughout assessment. Patient makes statements that he owns [place] and he is the richest man. Patient reports that he is powerful and if he is upset he will tell everyone in the world that person "will be taken care of." Patient speaks of attacks that occur but government will not inform citizens of information.  Patient engaged during assessment with direct eye contact throughout assessment. Patient contradicts himself several times and is a poor historian. Patient becomes guarded at times and refuses to elaborate on answers. Patient has inappropriate laughter and gives tangential information. Patient describes several delusions and is unable to focus on reality  Attendees:  Signature: Thedore Mins, MD 12/26/2012 10:24 AM   Signature: Richelle Ito, LCSW 12/26/2012 10:24 AM  Signature: Verne Spurr, PA 12/26/2012 10:24 AM  Signature: 12/26/2012 10:24 AM  Signature: Liborio Nixon, RN 12/26/2012 10:24 AM  Signature:  12/26/2012 10:24 AM  Signature:   12/26/2012 10:24 AM  Signature:    Signature:     Signature:    Signature:    Signature:    Signature:      Scribe for Treatment Team:   Richelle Ito, LCSW  12/26/2012 10:24 AM

## 2012-12-26 NOTE — [facility] Group Notes (Signed)
[facility] LCSW Aftercare Discharge Planning Group Note   12/26/2012 11:22 AM  Participation Quality:  Engaged  Mood/Affect:  Defensive  Depression Rating:  unknown  Anxiety Rating:  unknown  Thoughts of Suicide:  No Will you contract for safety?   NA  Current AVH:  Yes  Plan for Discharge/Comments:  Albert Hess presents as irritable, vague and guarded.  Refuses to answer any questions directly.  States he "owns the city of [place] and "I can find out anything."  [employer]: unknown  Supports: unknown  [name], Elye Harmsen [place] B

## 2012-12-27 MED ORDER — POLYVINYL ALCOHOL 1.4 % OP SOLN
1.0000 [drp] | OPHTHALMIC | Status: DC | PRN
  Administered 2012-12-27 – 2013-01-14 (×42): 1 [drp] via OPHTHALMIC
  Filled 2012-12-27 (×2): qty 15

## 2012-12-27 NOTE — Progress Notes (Signed)
See paper chart dar notes.
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
## 2012-12-27 NOTE — Progress Notes (Signed)
Patient ID: Albert Hess, male   DOB: 1946/02/17, 67 y.o.   MRN: [id number]  Nursing 1:1 note  D:Pt went to karaoke earlier tonight. Pt continues to have grandiose delusions. Pt was in room earlier then was sitting in the dayroom watching TV. A: 1:1 observation continues for safety .  R: pt remains safe

## 2012-12-27 NOTE — Clinical Social Work Note (Signed)
[facility] Group Notes:  (Counselor/Nursing/MHT/Case Management/Adjunct)  08/02/2012 2:20 PM  Type of Therapy:  Group Therapy  Participation Level:  Active  Participation Quality:  Attentive  Affect:  Flat  Cognitive:  Oriented  Insight:  Limited  Engagement in Group:  Engaged  Engagement in Therapy:  Limited  Modes of Intervention:  Discussion, Exploration and Socialization  Summary of Progress/Problems: .balance: The topic for group was balance in life.  Pt participated in the discussion about when their life was in balance and out of balance and how this feels.  Pt discussed ways to get back in balance and short term goals they can work on to get where they want to be. Albert Hess was engaged and involved throughout group.  He identified imbalance related to broken and damaged relationships, and identified balance as helping others.  He states he has been volunteering at the [employer], and he benefits greatly from this.  All his contributions were in the context of someone who continues to be actively delusional.   [name] 08/02/2012, 2:20 PM

## 2012-12-27 NOTE — B[facility] Group Notes (Signed)
[facility] LCSW Aftercare Discharge Planning Group Note   12/27/2012 1:37 PM  Participation Quality:  Engaged   Mood/Affect:  Appropriate  Depression Rating:  unknown  Anxiety Rating:  unknown  Thoughts of Suicide:  unknown Will you contract for safety?   NA  Current AVH:  unknown  Plan for Discharge/Comments:  Albert Hess listened attentively to others.  When it was his turn, he spoke in the same grandiose, delusional terms that he did yesterday.  Politely excused himself when he was done.  Transportation Means:  unknown  Supports:unknown  [name], Farrell B

## 2012-12-28 DIAGNOSIS — F3110 Bipolar disorder, current episode manic without psychotic features, unspecified: Secondary | ICD-10-CM

## 2012-12-28 LAB — GLUCOSE, CAPILLARY
Glucose-Capillary: 148 mg/dL — ABNORMAL HIGH (ref 70–99)
Glucose-Capillary: 97 mg/dL (ref 70–99)

## 2012-12-28 MED ORDER — SIMVASTATIN 10 MG PO TABS
5.0000 mg | ORAL_TABLET | Freq: Every day | ORAL | Status: DC
  Filled 2012-12-28: qty 0.5
  Filled 2012-12-28: qty 1
  Filled 2012-12-28 (×2): qty 0.5

## 2012-12-28 NOTE — Progress Notes (Signed)
D) Pt sitting in his room with his 1:1. Relaxing on the bed and joking around. Told this Clinical research associate that he owns [place]. Pt, although hypomanic, is pleasant and interactive with conversation.  A) Given support and therapeutic humor used with Pt. R) Pt remains safe.

## 2012-12-28 NOTE — Progress Notes (Signed)
Recreation Therapy Notes  Date: 04.11.2014  Time: 9:30am  Location: [address] Day Room   Group Topic/Focus: Teamwork, Goal Setting, Memory   Participation Level:  Minimal   Participation Quality:  Appropriate   Affect:  Euthymic   Cognitive:  Oriented  Additional Comments: Activities: Stretching; "Palm a Bean Bag" & "Memory Sequence." Explain ation: Stretching - patients were asked to do a series of stretches. Patient were asked to stretch arms, back, and legs. Stretching took approximately 10 minutes. "Palm a Bean Bag" - Patients were asked to hold a bean bag in hand with palm facing up. Patients were asked to pass the bean bag to the peer to their left by first going over their head and behind their back. Patients were asked to set a goal for how fast they could complete the activity following each round. "Memory Sequence" - Patients were asked to create a sequence of actions for each member of the group to complete. The entire group had to complete the sequence before a new action as added on. Each patient got a turn to add to the sequence of actions. Patients participated from a seated position.   Patient participated in stretching exercises. Patient did not participate in "Plam a Bean Bag." At this point the day room was filling with patients that were choosing not to participate in recreation therapy group. The added individuals were creating a distraction and were causing the exercise to be disjointed and ineffective. At this point LRT asked all patients that were not participating in group session to exit group space. Patient exited space without incident. Patient did not return to group session.    Patient interacted appropriately with peers and LRT.   Marykay Lex Keshia Weare, LRT/CTRS  Maxx Pham L 12/28/2012 2:05 PM
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
## 2012-12-28 NOTE — [facility] Group Notes (Cosign Needed)
[facility] LCSW Group Therapy  12/28/2012 2:19 PM  Type of Therapy:  Group Therapy  Participation Level:  Active  Participation Quality:  Appropriate  Affect:  Appropriate  Cognitive:  Alert and Appropriate  Insight:  Improving  Engagement in Therapy:  Engaged  Modes of Intervention:  Activity, Discussion, Exploration, Socialization and Support  Summary of Progress/Problems:RELAPSE: The topic for today was feelings about relapse. Pt discussed what relapse prevention is to them and identified triggers that they are on the path to relapse. Pt processed their feeling towards relapse and was able to relate to peers. Pt discussed coping skills that can be used for relapse prevention. Albert Hess participated in the group activity where he was asked to choose a discussion question, share his response with the group, and process his response with others. Albert Hess stated that the most understanding person in his life is his son, who takes care of him. 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    [name] 12/28/2012, 2:19 PM

## 2012-12-28 NOTE — Progress Notes (Signed)
D) Pt was permitted to go to the cafeteria for dinner and was very appropriate with his peers and with his daughter. Takes his medciations without problems. Pt's daughter expressed concern over Pt's manic behavior and stated that she does not want [place] to let Pt. Go too soon because of his unpredictability. A) Therapeutic humor used. Provided family with a 1:1 R) Remains safe.

## 2012-12-28 NOTE — Progress Notes (Signed)
Patient ID: Albert Hess, male   DOB: 04/02/1946, 66 y.o.   MRN: [id number]  Nursing 1:1 note D:Pt observed sleeping in bed with eyes closed. RR even and unlabored. No distress noted A: 1:1 observation continues for safety  R: pt remains safe  

## 2012-12-28 NOTE — [facility] Group Notes (Signed)
[facility] LCSW Aftercare Discharge Planning Group Note   12/28/2012 10:37 AM  Participation Quality:    Mood/Affect:  Appropriate  Depression Rating:  low  Anxiety Rating:  low  Thoughts of Suicide:  No Will you contract for safety?   Yes  Current AVH:  No  Plan for Discharge/Comments:  Pt plans to return home to his apt after discharge. Currently states that he is not in need of follow-up treatment. (**note: pt's son (guardian?) will be contacted today to discuss f/u care).   Transportation Means: car  Supports: son  Farah was in a pleasant mood this morning during group. He stated that the doctor said he can discharge when his fluid levels return to normal.   [employer]

## 2012-12-28 NOTE — Progress Notes (Signed)
Patient ID: Albert Hess, male   DOB: 1946/06/14, 67 y.o.   MRN: [id number]  Nursing 1:1 note D:Pt observed in dayroom talking on phone. RR even and unlabored. No distress noted.Pt in good spirits , says he talked to son today and he may see him tomorrow. Pt denies SI/HI/AVH/ and pain.  A: 1:1 observation continues for safety. Pt offered support and encouragement. R: pt remains safe. Pt receptive to treatment and safety maintained on unit.
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
## 2012-12-28 NOTE — Progress Notes (Addendum)
[facility] MD Progress Note  12/28/2012 12:56 AM REMIEL CORTI  MRN:  [id number] Subjective:  "I"m better, but not where I need to be." Diagnosis:  Bipolar I disorder, most recent episode (or current) manic   ADL's:  Impaired  Sleep: Fair  Appetite:  Good  Suicidal Ideation:  No never! Homicidal Ideation:  No never! AEB (as evidenced by):  Psychiatric Specialty Exam: Review of Systems  Constitutional: Negative.  Negative for fever, chills, weight loss, malaise/fatigue and diaphoresis.  HENT: Negative for congestion and sore throat.   Eyes: Negative for blurred vision, double vision and photophobia.  Respiratory: Negative for cough, shortness of breath and wheezing.   Cardiovascular: Negative for chest pain, palpitations and PND.  Gastrointestinal: Negative for heartburn, nausea, vomiting, abdominal pain, diarrhea and constipation.  Musculoskeletal: Negative for myalgias, joint pain and falls.  Neurological: Negative for dizziness, tingling, tremors, sensory change, speech change, focal weakness, seizures, loss of consciousness, weakness and headaches.  Endo/Heme/Allergies: Negative for polydipsia. Does not bruise/bleed easily.  Psychiatric/Behavioral: Positive for substance abuse. Negative for depression, suicidal ideas, hallucinations and memory loss. The patient is not nervous/anxious and does not have insomnia.     Blood pressure 155/83, pulse 101, temperature 97.9 F (36.6 C), temperature source Oral, resp. rate 18, height 5' 10.25" (1.784 m), weight 97.523 kg (215 lb).Body mass index is 30.64 kg/(m^2).  General Appearance: Fairly Groomed  Patent attorney::  Fair  Speech:  Pressured  Volume:  Normal  Mood:  Anxious  Affect:  Grandiose, flirtatious, engaging  Thought Process:  Circumstantial  Orientation:  Full (Time, Place, and Person)  Thought Content:  Delusions  Suicidal Thoughts:  No  Homicidal Thoughts:  No  Memory:  Immediate;   Fair  Judgement:  Impaired  Insight:   Present  Psychomotor Activity:  Restlessness  Concentration:  Poor  Recall:  Fair  Akathisia:  No  Handed:  Right  AIMS (if indicated):     Assets:  Social Support  Sleep:  Number of Hours: 6.75   Current Medications: Current Facility-Administered Medications  Medication Dose Route Frequency Provider Last Rate Last Dose  . acetaminophen (TYLENOL) tablet 650 mg  650 mg Oral Q6H PRN Kerry Hough, PA-C      . alum & mag hydroxide-simeth (MAALOX/MYLANTA) 200-200-20 MG/5ML suspension 30 mL  30 mL Oral Q4H PRN Kerry Hough, PA-C      . lisinopril (PRINIVIL,ZESTRIL) tablet 10 mg  10 mg Oral Daily Kerry Hough, PA-C   10 mg at 12/27/12 0756  . magnesium hydroxide (MILK OF MAGNESIA) suspension 30 mL  30 mL Oral Daily PRN Kerry Hough, PA-C      . polyvinyl alcohol (LIQUIFILM TEARS) 1.4 % ophthalmic solution 1 drop  1 drop Both Eyes PRN Karolee Stamps, NP   1 drop at 12/27/12 1727  . risperiDONE (RISPERDAL) tablet 1 mg  1 mg Oral BID PC Mojeed Akintayo   1 mg at 12/27/12 1806  . simvastatin (ZOCOR) tablet 5 mg  5 mg Oral q1800 Kerry Hough, PA-C   5 mg at 12/27/12 1806  . traZODone (DESYREL) tablet 50 mg  50 mg Oral QHS PRN Mojeed Akintayo   50 mg at 12/27/12 2303    Lab Results:  Results for orders placed during the hospital encounter of 12/25/12 (from the past 48 hour(s))  CBC     Status: Abnormal   Collection Time    12/26/12  6:15 AM      Result Value Range  WBC 3.5 (*) 4.0 - 10.5 K/uL   RBC 3.44 (*) 4.22 - 5.81 MIL/uL   Hemoglobin 11.6 (*) 13.0 - 17.0 g/dL   HCT 16.1 (*) 09.6 - 04.5 %   MCV 92.2  78.0 - 100.0 fL   MCH 33.7  26.0 - 34.0 pg   MCHC 36.6 (*) 30.0 - 36.0 g/dL   Comment: PRE-WARMING TECHNIQUE USED   RDW 11.6  11.5 - 15.5 %   Platelets 145 (*) 150 - 400 K/uL  COMPREHENSIVE METABOLIC PANEL     Status: Abnormal   Collection Time    12/26/12  6:15 AM      Result Value Range   Sodium 129 (*) 135 - 145 mEq/L   Potassium 5.1  3.5 - 5.1 mEq/L   Chloride 96   96 - 112 mEq/L   CO2 24  19 - 32 mEq/L   Glucose, Bld 86  70 - 99 mg/dL   BUN 14  6 - 23 mg/dL   Creatinine, Ser 4.09  0.50 - 1.35 mg/dL   Calcium 9.3  8.4 - 81.1 mg/dL   Total Protein 6.0  6.0 - 8.3 g/dL   Albumin 3.4 (*) 3.5 - 5.2 g/dL   AST 20  0 - 37 U/L   ALT 15  0 - 53 U/L   Alkaline Phosphatase 45  39 - 117 U/L   Total Bilirubin 0.6  0.3 - 1.2 mg/dL   GFR calc non Af Amer >90  >90 mL/min   GFR calc Af Amer >90  >90 mL/min   Comment:            The eGFR has been calculated     using the CKD EPI equation.     This calculation has not been     validated in all clinical     situations.     eGFR's persistently     <90 mL/min signify     possible Chronic Kidney Disease.  MAGNESIUM     Status: None   Collection Time    12/26/12  6:15 AM      Result Value Range   Magnesium 1.8  1.5 - 2.5 mg/dL  TSH     Status: None   Collection Time    12/26/12  6:15 AM      Result Value Range   TSH 1.538  0.350 - 4.500 uIU/mL  GLUCOSE, CAPILLARY     Status: None   Collection Time    12/26/12  6:18 AM      Result Value Range   Glucose-Capillary 88  70 - 99 mg/dL  GLUCOSE, CAPILLARY     Status: Abnormal   Collection Time    12/26/12 11:42 AM      Result Value Range   Glucose-Capillary 103 (*) 70 - 99 mg/dL  GLUCOSE, CAPILLARY     Status: Abnormal   Collection Time    12/26/12  5:00 PM      Result Value Range   Glucose-Capillary 112 (*) 70 - 99 mg/dL   Comment 1 Notify RN    GLUCOSE, CAPILLARY     Status: Abnormal   Collection Time    12/27/12  6:12 AM      Result Value Range   Glucose-Capillary 100 (*) 70 - 99 mg/dL  GLUCOSE, CAPILLARY     Status: None   Collection Time    12/27/12 11:53 AM      Result Value Range   Glucose-Capillary 90  70 - 99  mg/dL  GLUCOSE, CAPILLARY     Status: None   Collection Time    12/27/12  6:00 PM      Result Value Range   Glucose-Capillary 81  70 - 99 mg/dL    Physical Findings: AIMS: Facial and Oral Movements Muscles of Facial  Expression: None, normal Lips and Perioral Area: None, normal Jaw: None, normal Tongue: None, normal,Extremity Movements Upper (arms, wrists, hands, fingers): None, normal Lower (legs, knees, ankles, toes): None, normal, Trunk Movements Neck, shoulders, hips: None, normal, Overall Severity Severity of abnormal movements (highest score from questions above): None, normal Incapacitation due to abnormal movements: None, normal Patient's awareness of abnormal movements (rate only patient's report): No Awareness, Dental Status Current problems with teeth and/or dentures?: No Does patient usually wear dentures?: No  CIWA:  CIWA-Ar Total: 1 COWS:     Treatment Plan Summary: Daily contact with patient to assess and evaluate symptoms and progress in treatment Medication management  Plan: 1. Admit for crisis management and stabilization. 2. Medication management to reduce current symptoms to base line and improve the patient's overall level of functioning. 3. Treat health problems as indicated. 4. Develop treatment plan to decrease risk of relapse upon discharge and to reduce the need for readmission. 5. Psycho-social education regarding relapse prevention and self care. 6. Health care follow up as needed for medical problems. 7. Restart home medications where appropriate. 8. Continue current medication as written. 9. ELOS: 3-5 days  Medical Decision Making Problem Points:  Established problem, stable/improving (1) Data Points:  Review of medication regiment & side effects (2)  I certify that inpatient services furnished can reasonably be expected to improve the patient's condition.  [name]. Josha Weekley RPAC 1:03 AM 12/28/2012

## 2012-12-28 NOTE — Progress Notes (Signed)
[facility] Group Notes:  (Nursing/MHT/Case Management/Adjunct)  Date:  12/28/2012  Time:  11:18 PM  Type of Therapy:  Group Therapy  Participation Level:  Active  Participation Quality:  Appropriate and Redirectable  Affect:  Appropriate  Cognitive:  Oriented  Insight:  Good  Engagement in Group:  Engaged  Modes of Intervention:  Socialization and Support  Summary of Progress/Problems: Pt. Stated the coping skills he will use when he is discharged for relapse prevention.  Pt. Listed mediating and writing and a journal as coping skills.  Sondra Come 12/28/2012, 11:18 PM

## 2012-12-28 NOTE — Progress Notes (Signed)
Patient ID: Albert Hess, male   DOB: [birth year], 67 y.o.   MRN: [id number] [facility] MD Progress Note  12/28/2012 10:43 AM AUSENCIO VADEN  MRN:  [id number] Subjective:  "I am doing great and I am ready to go home." Objective: Patient present with decreased agitation and manic behavior today, however he remains psychotic and delusional. He is still maintaining that he owns almost all the properties in [place]. He reports that he is sleeping better and denies any adverse reactions to his medications.  Diagnosis:  Bipolar I disorder, most recent episode (or current) manic   ADL's:  Impaired  Sleep: Fair  Appetite:  Good  Suicidal Ideation:  denies Homicidal Ideation:  denies AEB (as evidenced by):  Psychiatric Specialty Exam: Review of Systems  Constitutional: Negative.  Negative for fever, chills, weight loss, malaise/fatigue and diaphoresis.  HENT: Negative for congestion and sore throat.   Eyes: Negative for blurred vision, double vision and photophobia.  Respiratory: Negative for cough, shortness of breath and wheezing.   Cardiovascular: Negative for chest pain, palpitations and PND.  Gastrointestinal: Negative for heartburn, nausea, vomiting, abdominal pain, diarrhea and constipation.  Musculoskeletal: Negative for myalgias, joint pain and falls.  Neurological: Negative for dizziness, tingling, tremors, sensory change, speech change, focal weakness, seizures, loss of consciousness, weakness and headaches.  Endo/Heme/Allergies: Negative for polydipsia. Does not bruise/bleed easily.  Psychiatric/Behavioral: Positive for substance abuse. Negative for depression, suicidal ideas, hallucinations and memory loss. The patient is not nervous/anxious and does not have insomnia.     Blood pressure 137/78, pulse 111, temperature 97.5 F (36.4 C), temperature source Oral, resp. rate 18, height 5' 10.25" (1.784 m), weight 97.523 kg (215 lb).Body mass index is 30.64 kg/(m^2).  General Appearance:  Fairly Groomed  Patent attorney::  Fair  Speech:  Pressured  Volume:  Normal  Mood:  Anxious  Affect:  Grandiose, flirtatious, engaging  Thought Process:  Circumstantial  Orientation:  Full (Time, Place, and Person)  Thought Content:  Delusions  Suicidal Thoughts:  No  Homicidal Thoughts:  No  Memory:  Immediate;   Fair  Judgement:  Impaired  Insight:  Present  Psychomotor Activity:  Restlessness  Concentration:  Poor  Recall:  Fair  Akathisia:  No  Handed:  Right  AIMS (if indicated):     Assets:  Social Support  Sleep:  Number of Hours: 4.25   Current Medications: Current Facility-Administered Medications  Medication Dose Route Frequency Provider Last Rate Last Dose  . acetaminophen (TYLENOL) tablet 650 mg  650 mg Oral Q6H PRN Kerry Hough, PA-C      . alum & mag hydroxide-simeth (MAALOX/MYLANTA) 200-200-20 MG/5ML suspension 30 mL  30 mL Oral Q4H PRN Kerry Hough, PA-C      . lisinopril (PRINIVIL,ZESTRIL) tablet 10 mg  10 mg Oral Daily Kerry Hough, PA-C   10 mg at 12/28/12 0819  . magnesium hydroxide (MILK OF MAGNESIA) suspension 30 mL  30 mL Oral Daily PRN Kerry Hough, PA-C      . polyvinyl alcohol (LIQUIFILM TEARS) 1.4 % ophthalmic solution 1 drop  1 drop Both Eyes PRN Karolee Stamps, NP   1 drop at 12/27/12 1727  . risperiDONE (RISPERDAL) tablet 1 mg  1 mg Oral BID PC Briele Lagasse   1 mg at 12/28/12 0818  . simvastatin (ZOCOR) tablet 5 mg  5 mg Oral q1800 Kerry Hough, PA-C   5 mg at 12/27/12 1806  . traZODone (DESYREL) tablet 50 mg  50  mg Oral QHS PRN Miyoko Hashimi   50 mg at 12/27/12 2303    Lab Results:  Results for orders placed during the hospital encounter of 12/25/12 (from the past 48 hour(s))  GLUCOSE, CAPILLARY     Status: Abnormal   Collection Time    12/26/12 11:42 AM      Result Value Range   Glucose-Capillary 103 (*) 70 - 99 mg/dL  GLUCOSE, CAPILLARY     Status: Abnormal   Collection Time    12/26/12  5:00 PM      Result Value Range    Glucose-Capillary 112 (*) 70 - 99 mg/dL   Comment 1 Notify RN    GLUCOSE, CAPILLARY     Status: Abnormal   Collection Time    12/27/12  6:12 AM      Result Value Range   Glucose-Capillary 100 (*) 70 - 99 mg/dL  GLUCOSE, CAPILLARY     Status: None   Collection Time    12/27/12 11:53 AM      Result Value Range   Glucose-Capillary 90  70 - 99 mg/dL  GLUCOSE, CAPILLARY     Status: None   Collection Time    12/27/12  6:00 PM      Result Value Range   Glucose-Capillary 81  70 - 99 mg/dL  GLUCOSE, CAPILLARY     Status: None   Collection Time    12/28/12  6:11 AM      Result Value Range   Glucose-Capillary 88  70 - 99 mg/dL    Physical Findings: AIMS: Facial and Oral Movements Muscles of Facial Expression: None, normal Lips and Perioral Area: None, normal Jaw: None, normal Tongue: None, normal,Extremity Movements Upper (arms, wrists, hands, fingers): None, normal Lower (legs, knees, ankles, toes): None, normal, Trunk Movements Neck, shoulders, hips: None, normal, Overall Severity Severity of abnormal movements (highest score from questions above): None, normal Incapacitation due to abnormal movements: None, normal Patient's awareness of abnormal movements (rate only patient's report): No Awareness, Dental Status Current problems with teeth and/or dentures?: No Does patient usually wear dentures?: No  CIWA:  CIWA-Ar Total: 1 COWS:     Treatment Plan Summary: Daily contact with patient to assess and evaluate symptoms and progress in treatment Medication management  Plan: 1. Admit for crisis management and stabilization. 2. Medication management to reduce current symptoms to base line and improve the patient's overall level of functioning. 3. Treat health problems as indicated. 4. Develop treatment plan to decrease risk of relapse upon discharge and to reduce the need for readmission. 5. Psycho-social education regarding relapse prevention and self care. 6. Health care  follow up as needed for medical problems. 7. Restart home medications where appropriate. 8. Continue current medication as written. 9. ELOS: 3-5 days  10. Repeat Basic metabolic panel on Monday to monitor sodium level. Medical Decision Making Problem Points:  Established problem, stable/improving (1) Data Points:  Review of medication regiment & side effects (2)  I certify that inpatient services furnished can reasonably be expected to improve the patient's condition.  Thedore Mins, MD 10:43 AM 12/28/2012

## 2012-12-29 DIAGNOSIS — L97509 Non-pressure chronic ulcer of other part of unspecified foot with unspecified severity: Secondary | ICD-10-CM | POA: Diagnosis present

## 2012-12-29 LAB — GLUCOSE, CAPILLARY
Glucose-Capillary: 119 mg/dL — ABNORMAL HIGH (ref 70–99)
Glucose-Capillary: 96 mg/dL (ref 70–99)

## 2012-12-29 NOTE — [facility] Group Notes (Signed)
[facility] LCSW Group Therapy  12/29/2012 11:15-11:45am  Summary of Progress/Problems:  In group, we worked to identify ways in which patients sabotaged their own recovery, and why they may have done this, plus what they think will help to prevent this self-sabotage in the future.  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  He then backed up and said that he is on a 1:1 due to fall risk and pointed out his yellow arm band and red socks.  He said he is only concerned with getting out of the hospital, saying "I've done this before.  I'm ready."  Type of Therapy:  Group Therapy  Participation Level:  Active  Participation Quality:  Attentive and Sharing  Affect:  Blunted  Cognitive:  Delusional  Insight:  Limited  Engagement in Therapy:  Improving   Modes of Intervention:  Discussion, Exploration and Support    Sarina Ser 12/29/2012, 12:03 PM

## 2012-12-29 NOTE — Progress Notes (Signed)
Patient ID: Albert Hess, male   DOB: 02-May-1946, 67 y.o.   MRN: [id number]  Nursing 1:1 note D:Pt observed sleeping in bed with eyes closed. RR even and unlabored. A: 1:1 observation continues for safety  R: pt remains safe

## 2012-12-29 NOTE — Progress Notes (Signed)
Patient ID: Albert Hess, male   DOB: 06-19-1946, 66 y.o.   MRN: [id number] [facility] MD Progress Note  12/29/2012 9:47 PM Albert Hess  MRN:  [id number] Subjective:  "I'm the best I could be." "I'm doing great!" Objective: Patient present with decreased agitation and manic behavior today, however he notes that he slept well. The medicine is great! States he slept, "like a rock!" and ate, "like a horse." Diagnosis:  Bipolar I disorder, most recent episode (or current) manic   ADL's:  Impaired  Sleep: Fair  Appetite:  Good  Suicidal Ideation:  denies Homicidal Ideation:  denies AEB (as evidenced by):  Psychiatric Specialty Exam: Review of Systems  Constitutional: Negative.  Negative for fever, chills, weight loss, malaise/fatigue and diaphoresis.  HENT: Negative for congestion and sore throat.   Eyes: Negative for blurred vision, double vision and photophobia.  Respiratory: Negative for cough, shortness of breath and wheezing.   Cardiovascular: Negative for chest pain, palpitations and PND.  Gastrointestinal: Negative for heartburn, nausea, vomiting, abdominal pain, diarrhea and constipation.  Musculoskeletal: Negative for myalgias, joint pain and falls.  Neurological: Negative for dizziness, tingling, tremors, sensory change, speech change, focal weakness, seizures, loss of consciousness, weakness and headaches.  Endo/Heme/Allergies: Negative for polydipsia. Does not bruise/bleed easily.  Psychiatric/Behavioral: Positive for substance abuse. Negative for depression, suicidal ideas, hallucinations and memory loss. The patient is not nervous/anxious and does not have insomnia.     Blood pressure 96/67, pulse 108, temperature 98.2 F (36.8 C), temperature source Oral, resp. rate 20, height 5' 10.25" (1.784 m), weight 97.523 kg (215 lb).Body mass index is 30.64 kg/(m^2).  General Appearance: Fairly Groomed  Patent attorney::  Fair  Speech:  Pressured  Volume:  Normal  Mood:  Anxious  Affect:   Grandiose, expansive   Thought Process:  Circumstantial  Orientation:  Full (Time, Place, and Person)  Thought Content:  Delusions  Suicidal Thoughts:  No  Homicidal Thoughts:  No  Memory:  Immediate;   Fair  Judgement:  Impaired  Insight:  Present  Psychomotor Activity:  Restlessness  Concentration:  Poor  Recall:  Fair  Akathisia:  No  Handed:  Right  AIMS (if indicated):     Assets:  Social Support  Sleep:  Number of Hours: 3.75   Current Medications: Current Facility-Administered Medications  Medication Dose Route Frequency Provider Last Rate Last Dose  . acetaminophen (TYLENOL) tablet 650 mg  650 mg Oral Q6H PRN Kerry Hough, PA-C      . alum & mag hydroxide-simeth (MAALOX/MYLANTA) 200-200-20 MG/5ML suspension 30 mL  30 mL Oral Q4H PRN Kerry Hough, PA-C   30 mL at 12/29/12 0132  . lisinopril (PRINIVIL,ZESTRIL) tablet 10 mg  10 mg Oral Daily Kerry Hough, PA-C   10 mg at 12/29/12 0752  . magnesium hydroxide (MILK OF MAGNESIA) suspension 30 mL  30 mL Oral Daily PRN Kerry Hough, PA-C      . polyvinyl alcohol (LIQUIFILM TEARS) 1.4 % ophthalmic solution 1 drop  1 drop Both Eyes PRN Karolee Stamps, NP   1 drop at 12/29/12 1346  . risperiDONE (RISPERDAL) tablet 1 mg  1 mg Oral BID PC Mojeed Akintayo   1 mg at 12/29/12 1842  . simvastatin (ZOCOR) tablet 5 mg  5 mg Oral q1800 Kerry Hough, PA-C   5 mg at 12/29/12 1842  . traZODone (DESYREL) tablet 50 mg  50 mg Oral QHS PRN Mojeed Akintayo   50 mg at 12/29/12  5409    Lab Results:  Results for orders placed during the hospital encounter of 12/25/12 (from the past 48 hour(s))  GLUCOSE, CAPILLARY     Status: None   Collection Time    12/28/12  6:11 AM      Result Value Range   Glucose-Capillary 88  70 - 99 mg/dL  GLUCOSE, CAPILLARY     Status: None   Collection Time    12/28/12 11:27 AM      Result Value Range   Glucose-Capillary 97  70 - 99 mg/dL   Comment 1 Documented in Chart     Comment 2 Notify RN     GLUCOSE, CAPILLARY     Status: Abnormal   Collection Time    12/28/12  5:08 PM      Result Value Range   Glucose-Capillary 110 (*) 70 - 99 mg/dL  GLUCOSE, CAPILLARY     Status: None   Collection Time    12/28/12  9:45 PM      Result Value Range   Glucose-Capillary 91  70 - 99 mg/dL   Comment 1 Notify RN    GLUCOSE, CAPILLARY     Status: Abnormal   Collection Time    12/29/12  5:43 AM      Result Value Range   Glucose-Capillary 119 (*) 70 - 99 mg/dL  GLUCOSE, CAPILLARY     Status: Abnormal   Collection Time    12/29/12 11:51 AM      Result Value Range   Glucose-Capillary 133 (*) 70 - 99 mg/dL   Comment 1 Documented in Chart     Comment 2 Notify RN    GLUCOSE, CAPILLARY     Status: None   Collection Time    12/29/12  5:12 PM      Result Value Range   Glucose-Capillary 96  70 - 99 mg/dL   Comment 1 Notify RN     Comment 2 Documented in Chart      Physical Findings: AIMS: Facial and Oral Movements Muscles of Facial Expression: None, normal Lips and Perioral Area: None, normal Jaw: None, normal Tongue: None, normal,Extremity Movements Upper (arms, wrists, hands, fingers): None, normal Lower (legs, knees, ankles, toes): None, normal, Trunk Movements Neck, shoulders, hips: None, normal, Overall Severity Severity of abnormal movements (highest score from questions above): None, normal Incapacitation due to abnormal movements: None, normal Patient's awareness of abnormal movements (rate only patient's report): No Awareness, Dental Status Current problems with teeth and/or dentures?: No Does patient usually wear dentures?: No  CIWA:  CIWA-Ar Total: 1 COWS:     Treatment Plan Summary: Daily contact with patient to assess and evaluate symptoms and progress in treatment Medication management  Plan: 1. Admit for crisis management and stabilization. 2. Medication management to reduce current symptoms to base line and improve the patient's overall level of functioning. 3.  Treat health problems as indicated. 4. Develop treatment plan to decrease risk of relapse upon discharge and to reduce the need for readmission. 5. Psycho-social education regarding relapse prevention and self care. 6. Health care follow up as needed for medical problems. 7. Restart home medications where appropriate. 8. Continue current medication as written. 9. ELOS: 3-5 days  10. Repeat Basic metabolic panel on Monday to monitor sodium level. 11.Reviewed medication and will continue current regimen. 12. Will cover and treat foot ulcers and contact wound care consult tomorrow. Medical Decision Making Problem Points:  Established problem, stable/improving (1) Data Points:  Review of medication regiment &  side effects (2)  I certify that inpatient services furnished can reasonably be expected to improve the patient's condition. [name]. Kenslee Achorn RPAC 10:09 PM 12/29/2012  12/29/2012

## 2012-12-29 NOTE — Progress Notes (Addendum)
Patient ID: Albert Hess, male   DOB: [birth year], 67 y.o.   MRN: [id number]  Nursing 1:1 note D:Pt was in cafeteria eating. RR even and unlabored. No distress noted. Pt R-foot was observed to have severe cracking. PA notified and  looked at his feet.   A: 1:1 observation continues for safety  R: pt remains safe

## 2012-12-29 NOTE — Progress Notes (Addendum)
Patient ID: Albert Hess, male   DOB: 10/30/1945, 67 y.o.   MRN: [id number] 12-29-12@1000  nursing 1:1 note: D: pt is grandiose and delusional. He denies any si/hi presently. His cbg was 133 at 1200noon. He attended group this am. He ate 100 % of his breakfast, had a b/m this am and completed his adl's. A: staff will continue to support patient and attend to his needs. R: he is receptive to support and encouragement.

## 2012-12-29 NOTE — Progress Notes (Signed)
Patient ID: Albert Hess, male   DOB: 06-18-1946, 67 y.o.   MRN: [id number]  Per PA pt soaked feet and Antibiotic ointment applied.

## 2012-12-29 NOTE — Progress Notes (Signed)
Patient ID: AVYN COATE, male   DOB: 1946-06-14, 67 y.o.   MRN: [id number] Psychoeducational Group Note  Date:  12/29/2012 Time:1000am  Group Topic/Focus:  Identifying Needs:   The focus of this group is to help patients identify their personal needs that have been historically problematic and identify healthy behaviors to address their needs.  Participation Level:  Active  Participation Quality:  Appropriate  Affect:  Anxious  Cognitive:  Appropriate  Insight:  Resistant  Engagement in Group:  Resistant  Additional Comments:  Inventory group and healthy coping skills   [name] 12/29/2012,10:24 AM

## 2012-12-29 NOTE — Progress Notes (Signed)
Patient ID: Albert Hess, male   DOB: [birth year], 67 y.o.   MRN: [id number]  Nursing 1:1 note D:Pt in room getting dressed for day. RR even and unlabored. No distress noted. Pt had In-276ml and Out-815ml for evening. Then headed to dayroom to get vitals checked.  A: 1:1 observation continues for safety  R: pt remains safe

## 2012-12-29 NOTE — Progress Notes (Signed)
Patient ID: Albert Hess, male   DOB: 1945-10-22, 67 y.o.   MRN: [id number]  Nursing 1:1 note D:Pt in room resting. RR even and unlabored. No distress noted. A: 1:1 observation continues for safety  R: pt remains safe

## 2012-12-29 NOTE — Progress Notes (Signed)
Patient ID: Albert Hess, male   DOB: 05/03/1946, 67 y.o.   MRN: [id number] 12-29-12 @1400  D: pt has not voiced any complaints other than his dry eyes. He has been pleasant/cooperative and came to am groups. A: he has taken his medications and denied any other needs, other than the dry eyes. He was given eye drops prn.He took in 720 cc of fluids at lunch as well as ate 100% of his meal. He expressed happiness when his friend and daughter came and ate lunch with him. R: the 1:1 continues. Staff will monitor and encourage this patient.

## 2012-12-30 LAB — GLUCOSE, CAPILLARY
Glucose-Capillary: 87 mg/dL (ref 70–99)
Glucose-Capillary: 101 mg/dL — ABNORMAL HIGH (ref 70–99)

## 2012-12-30 NOTE — [facility] Group Notes (Signed)
[facility] LCSW Group Therapy  12/30/2012 11:15am-12:00pm  Summary of Progress/Problems:  The main focus of today's therapy group was to listen to various genres of music and to identify that different types of music provoke different responses.  The patient then was able to identify personally what was soothing for them, as well as energizing.  Handouts were used to record feelings evoked, as well as how patient can personally use this knowledge in sleep habits, with depression, and with other symptoms.    Type of Therapy:  Group Therapy  Participation Level:  Active  Participation Quality:  Appropriate, Attentive and Sharing  Affect:  Anxious and Blunted  Cognitive:  Disorganized  Insight:  Engaged  Engagement in Therapy:  Engaged  Modes of Intervention:  Activity and Exploration  Albert Hess 12/30/2012, 1:00 PM

## 2012-12-30 NOTE — Progress Notes (Addendum)
1:1 Continuous Observation Progress Note  D) Patient in day room with MHT within arm's reach. Patient engaged and appropriate with peers and staff. Patient denies any questions/concerns.   A) Patient offered support and encouragement. Patient encouraged to call for help upon attempting to stand, patient reminded of increased risk for falls. Patient reminded of fluid intake restrictions. Patient compliant of fall and fluid restrictions, patient verbalizes understanding of fall safety plan.   R) Patient remains on 1:1 observation for safety. Patient educated to fall safety plan using teachback method. Patient safe on unit with Q15 minute checks. Will continue to monitor.

## 2012-12-30 NOTE — Progress Notes (Signed)
1:1 Continuous Observation Note  D) Patient pleasant and appropriate upon my approach. Patient resting in bed with MHT at bedside. Patient verbalizes "I am feeling good today, I will be up to get my medicines at the window." [name], denies AVH.   A) Patient offered support and encouragement. Patient reminded of increased risk for falls, patient verbalizes understanding. Patient educated to morning medications, verbalizes understanding. Patient taking medications as ordered. Patient monitored 1:1 for safety at this time.   R) Patient denies any questions/concerns at this time. Patient encouraged to approach staff with any concerns. Patient safe on unit with  Q15 minute safety checks. Will continue to monitor.

## 2012-12-30 NOTE — Progress Notes (Signed)
Patient ID: Albert Hess, male   DOB: 1945-11-13, 67 y.o.   MRN: [id number] Psychoeducational Group Note  Date:  12/30/2012 Time:1000am  Group Topic/Focus:  Identifying Needs:   The focus of this group is to help patients identify their personal needs that have been historically problematic and identify healthy behaviors to address their needs.  Participation Level:  Did Not Attend  Participation Quality:    Affect:  Cognitive: Insight: Engagement in Group: Additional Comments:  Inventory group and healthy support systems  [name] 12/30/2012,10:27 AM

## 2012-12-30 NOTE — Progress Notes (Signed)
1:1 Continuous Observation Note  D) Patient currently in group in dayroom, MHT at patient's side. Patient cooperative and supportive in group setting. [name], denies AVH. Patient continues to have delusions of grandeur, states "[place]." Patient continues to have paranoid delusions, verbalizes "I am making that come on the TV right now because I want to see the weather," patient then closes his eyes stating "I have to concentrate on what I will see on the television."  Patient's speech is rapid and pressured at times.    A) Patient offered support and encouragement. Patient reminded of increased risk for falls, patient verbalizes understanding. Patient compliant with 1:1 observer, remains safe on unit at this time. Q15 minute checks for safety continue.   R) Patient denies any questions/concerns at this time. Patient compliant with staff. Will continue to monitor.

## 2012-12-30 NOTE — Progress Notes (Signed)
Patient ID: Albert Hess, male   DOB: 1946-03-15, 67 y.o.   MRN: [id number] [facility] MD Progress Note  12/29/2012 9:47 PM Albert Hess  MRN:  [id number] Subjective: "I'm doing great, I'm surrounded by women here, how could I not be doing great?"   Objective: Patient present with decreased agitation and manic behavior today, however he notes that he slept well. Continues to be grandiose but less so. Currently has his feet elevated as requested and wound care consult is made. He is not attending all groups.   Diagnosis:  Bipolar I disorder, most recent episode (or current) manic   ADL's:  Impaired  Sleep: good  Appetite:  Good  Suicidal Ideation:  denies Homicidal Ideation:  denies AEB (as evidenced by):  Psychiatric Specialty Exam: Review of Systems  Constitutional: Negative.  Negative for fever, chills, weight loss, malaise/fatigue and diaphoresis.  HENT: Negative for congestion and sore throat.   Eyes: Negative for blurred vision, double vision and photophobia.  Respiratory: Negative for cough, shortness of breath and wheezing.   Cardiovascular: Negative for chest pain, palpitations and PND.  Gastrointestinal: Negative for heartburn, nausea, vomiting, abdominal pain, diarrhea and constipation.  Musculoskeletal: Negative for myalgias, joint pain and falls.  Neurological: Negative for dizziness, tingling, tremors, sensory change, speech change, focal weakness, seizures, loss of consciousness, weakness and headaches.  Endo/Heme/Allergies: Negative for polydipsia. Does not bruise/bleed easily.  Psychiatric/Behavioral: Positive for substance abuse. Negative for depression, suicidal ideas, hallucinations and memory loss. The patient is not nervous/anxious and does not have insomnia.     Blood pressure 96/67, pulse 108, temperature 98.2 F (36.8 C), temperature source Oral, resp. rate 20, height 5' 10.25" (1.784 m), weight 97.523 kg (215 lb).Body mass index is 30.64 kg/(m^2).  General  Appearance: Fairly Groomed  Patent attorney::  Fair  Speech:  Pressured  Volume:  Normal  Mood:  Anxious  Affect:  Grandiose, expansive still a little flirty  Thought Process:  Circumstantial  Orientation:  Full (Time, Place, and Person)  Thought Content:  Delusions  Suicidal Thoughts:  No  Homicidal Thoughts:  No  Memory:  Immediate;   Fair  Judgement:  Impaired  Insight:  Present  Psychomotor Activity:  Restlessness  Concentration:  Poor  Recall:  Fair  Akathisia:  No  Handed:  Right  AIMS (if indicated):     Assets:  Social Support  Sleep:  Number of Hours: 3.75   Current Medications: Current Facility-Administered Medications  Medication Dose Route Frequency Provider Last Rate Last Dose  . acetaminophen (TYLENOL) tablet 650 mg  650 mg Oral Q6H PRN Kerry Hough, PA-C      . alum & mag hydroxide-simeth (MAALOX/MYLANTA) 200-200-20 MG/5ML suspension 30 mL  30 mL Oral Q4H PRN Kerry Hough, PA-C   30 mL at 12/29/12 0132  . lisinopril (PRINIVIL,ZESTRIL) tablet 10 mg  10 mg Oral Daily Kerry Hough, PA-C   10 mg at 12/29/12 0752  . magnesium hydroxide (MILK OF MAGNESIA) suspension 30 mL  30 mL Oral Daily PRN Kerry Hough, PA-C      . polyvinyl alcohol (LIQUIFILM TEARS) 1.4 % ophthalmic solution 1 drop  1 drop Both Eyes PRN Karolee Stamps, NP   1 drop at 12/29/12 1346  . risperiDONE (RISPERDAL) tablet 1 mg  1 mg Oral BID PC Mojeed Akintayo   1 mg at 12/29/12 1842  . simvastatin (ZOCOR) tablet 5 mg  5 mg Oral q1800 Kerry Hough, PA-C   5 mg at  12/29/12 1842  . traZODone (DESYREL) tablet 50 mg  50 mg Oral QHS PRN Mojeed Akintayo   50 mg at 12/29/12 0038    Lab Results:  Results for orders placed during the hospital encounter of 12/25/12 (from the past 48 hour(s))  GLUCOSE, CAPILLARY     Status: None   Collection Time    12/28/12  6:11 AM      Result Value Range   Glucose-Capillary 88  70 - 99 mg/dL  GLUCOSE, CAPILLARY     Status: None   Collection Time    12/28/12  11:27 AM      Result Value Range   Glucose-Capillary 97  70 - 99 mg/dL   Comment 1 Documented in Chart     Comment 2 Notify RN    GLUCOSE, CAPILLARY     Status: Abnormal   Collection Time    12/28/12  5:08 PM      Result Value Range   Glucose-Capillary 110 (*) 70 - 99 mg/dL  GLUCOSE, CAPILLARY     Status: None   Collection Time    12/28/12  9:45 PM      Result Value Range   Glucose-Capillary 91  70 - 99 mg/dL   Comment 1 Notify RN    GLUCOSE, CAPILLARY     Status: Abnormal   Collection Time    12/29/12  5:43 AM      Result Value Range   Glucose-Capillary 119 (*) 70 - 99 mg/dL  GLUCOSE, CAPILLARY     Status: Abnormal   Collection Time    12/29/12 11:51 AM      Result Value Range   Glucose-Capillary 133 (*) 70 - 99 mg/dL   Comment 1 Documented in Chart     Comment 2 Notify RN    GLUCOSE, CAPILLARY     Status: None   Collection Time    12/29/12  5:12 PM      Result Value Range   Glucose-Capillary 96  70 - 99 mg/dL   Comment 1 Notify RN     Comment 2 Documented in Chart      Physical Findings: AIMS: Facial and Oral Movements Muscles of Facial Expression: None, normal Lips and Perioral Area: None, normal Jaw: None, normal Tongue: None, normal,Extremity Movements Upper (arms, wrists, hands, fingers): None, normal Lower (legs, knees, ankles, toes): None, normal, Trunk Movements Neck, shoulders, hips: None, normal, Overall Severity Severity of abnormal movements (highest score from questions above): None, normal Incapacitation due to abnormal movements: None, normal Patient's awareness of abnormal movements (rate only patient's report): No Awareness, Dental Status Current problems with teeth and/or dentures?: No Does patient usually wear dentures?: No  CIWA:  CIWA-Ar Total: 1 COWS:     Treatment Plan Summary: Daily contact with patient to assess and evaluate symptoms and progress in treatment Medication management  Plan: 1. Admit for crisis management and  stabilization. 2. Medication management to reduce current symptoms to base line and improve the patient's overall level of functioning. 3. Treat health problems as indicated. 4. Develop treatment plan to decrease risk of relapse upon discharge and to reduce the need for readmission. 5. Psycho-social education regarding relapse prevention and self care. 6. Health care follow up as needed for medical problems. 7. Continue to elevate feet when possible. 8. Continue current medication as written. 9. ELOS: 2-4 days  10. Repeat Basic metabolic panel on Monday to monitor sodium level. 11.Reviewed medication and will continue current regimen. 12. Will cover and treat foot  ulcers and contact wound care consult tomorrow. Medical Decision Making Problem Points:  Established problem, stable/improving (1) Data Points:  Review of medication regiment & side effects (2)  I certify that inpatient services furnished can reasonably be expected to improve the patient's condition. [name]. Dacen Frayre RPAC 4:46 PM 12/30/2012

## 2012-12-30 NOTE — Progress Notes (Signed)
Adult Psychoeducational Group Note  Date:  12/30/2012 Time:  1100  Group Topic/Focus:  Spirituality:   The focus of this group is to discuss how one's spirituality can aide in recovery.  Participation Level:  Active  Participation Quality:  Appropriate, Sharing and Supportive  Affect:  Appropriate  Cognitive:  Appropriate and Oriented  Insight: Good  Engagement in Group:  Engaged and Supportive  Modes of Intervention:  Discussion, Education and Socialization  Additional Comments:  Patient sharing with loose associations at times discussing "my son signed me in here, I am usually called Mitch, I am thankful for my operations on my feet and I sometimes make mistakes."  Noah Charon 12/30/2012, 11:21 AM

## 2012-12-30 NOTE — Progress Notes (Signed)
Patient ID: Albert Hess, male   DOB: 19-Aug-1946, 66 y.o.   MRN: [id number]  Nursing 1:1 note  D:Pt observed in dayroom in group. RR even and unlabored. No distress noted. Pt states he had a good day, he soaked his feet 2x today. Pt continues to be delusional.   A: 1:1 observation continues for safety   R: pt remains safe

## 2012-12-31 LAB — GLUCOSE, CAPILLARY: Glucose-Capillary: 99 mg/dL (ref 70–99)

## 2012-12-31 LAB — BASIC METABOLIC PANEL
BUN: 28 mg/dL — ABNORMAL HIGH (ref 6–23)
CO2: 25 mEq/L (ref 19–32)
Chloride: 105 mEq/L (ref 96–112)
Creatinine, Ser: 0.95 mg/dL (ref 0.50–1.35)
GFR calc Af Amer: >90 mL/min (ref >90)
Glucose, Bld: 104 mg/dL — ABNORMAL HIGH (ref 70–99)
Potassium: 4.9 mEq/L (ref 3.5–5.1)

## 2012-12-31 MED ORDER — WHITE PETROLATUM GEL
Freq: Every day | Status: AC
  Administered 2012-12-31 – 2013-01-01 (×2): via TOPICAL
  Administered 2013-01-02 – 2013-01-03 (×2): 1 via TOPICAL
  Administered 2013-01-04: 11:00:00 via TOPICAL
  Administered 2013-01-06: 1 via TOPICAL
  Administered 2013-01-07: 09:00:00 via TOPICAL
  Administered 2013-01-08 – 2013-01-10 (×3): 1 via TOPICAL
  Administered 2013-01-11 – 2013-01-13 (×3): via TOPICAL
  Administered 2013-01-14 – 2013-01-20 (×5): 1 via TOPICAL

## 2012-12-31 NOTE — Progress Notes (Signed)
The focus of this group is to help patients review their daily goal of treatment and discuss progress on daily workbooks. Pt attended the evening group session and responded to all discussion prompts. Pt gave several bizarre responses to questions. Pt stated he was doing well because he was discharging soon. When Writer asked when he was discharging, Pt responded "As soon as you finish talking." Pt smiled throughout group and offered encouraging comments to his peers. Pt did not appear to be paying any attention to the group when not being directly addressed.

## 2012-12-31 NOTE — Progress Notes (Signed)
D: Patient resting in bed with eyes closed.  Respirations even and unlabored.  Patient appears to be in no apparent distress. A: Staff to monitor Q 15 mins for safety.   R:Patient remains safe on the unit.  

## 2012-12-31 NOTE — Progress Notes (Signed)
[facility] Post 1:1 Observation Documentation  For the first (8) hours following discontinuation of 1:1 precautions, a progress note entry by nursing staff should be documented at least every 2 hours, reflecting the patient's behavior, condition, mood, and conversation.  Use the progress notes for additional entries.  Time 1:1 discontinued:  16:18  Patient's Behavior:  Patient cooperative.  Patient sitting on the side of his bed putting Vaseline to his feet.    Patient's Condition: Patient has no complaints.  Patient denies pain and is steady on his feet   Patient's Conversation:  Patient hyperverbal but logical and coherent at this time.  Angeline Slim [facility] 12/31/2012 1930

## 2012-12-31 NOTE — Progress Notes (Signed)
D: Patient in the his room on first approach tonight.  Patient states he had a good day  Today.  Patient hyper-verbal, delusional, and grandiose.  Patient did have a period where he thought it was time for breakfast but it was time for bedtime.  Patient was redirected at this time.  [name] and denies AVH.   A: Staff to monitor Q 15 mins for safety.  Encouragement and support offered.  No scheduled medications administered tonight.  Trazodone administered prn for sleep.  Liquifilm tears prn administered prn for dry eyes. R: Patient remains safe on the unit.  Patient attended group tonight.  Patient remains hyper-verbal.  Patient cooperative and taking administered medications.

## 2012-12-31 NOTE — Progress Notes (Signed)
Date: 12/31/2012  Time: 11:00am  Group Topic/Focus:  Wellness Toolbox: The focus of this group is to discuss various aspects of wellness, balancing those aspects and exploring ways to increase the ability to experience wellness. Patients will create a wellness toolbox for use upon discharge.  Participation Level: Active  Participation Quality: Appropriate, Sharing and Supportive  Affect: Appropriate and Excited  Cognitive: Appropriate  Insight: Appropriate  Engagement in Group: Engaged and Supportive  Modes of Intervention: Education and Support  Additional Comments: none  Lavona Norsworthy M  12/31/2012 12:00pm  

## 2012-12-31 NOTE — Tx Team (Signed)
  Interdisciplinary Treatment Plan Update   Date Reviewed:  12/31/2012  Time Reviewed:  3:43 PM  Progress in Treatment:   Attending groups: Yes Participating in groups: Yes Taking medication as prescribed: Yes  Tolerating medication: Yes Family/Significant other contact made: Yes  Patient understands diagnosis: No  Demonstrates minimal insight Discussing patient identified problems/goals with staff: Yes  See initial plan Medical problems stabilized or resolved: Yes Denies suicidal/homicidal ideation: No   Patient has not harmed self or others: Yes  For review of initial/current patient goals, please see plan of care.  Estimated Length of Stay:  2-3 days  Reason for Continuation of Hospitalization: Delusions, Medication stabilization  New Problems/Goals identified:  N/A  Discharge Plan or Barriers:   return home, follow up outpt  Additional Comments: Pt remains delusional, which will likely not change during hospitalization.  Left msg for daughter-in-law today to talk about d/c plan.   Attendees:  Signature: Thedore Mins, MD 12/31/2012 3:43 PM   Signature: Richelle Ito, LCSW 12/31/2012 3:43 PM  Signature: Verne Spurr, PA 12/31/2012 3:43 PM  Signature: Joslyn Devon, RN 12/31/2012 3:43 PM  Signature: Liborio Nixon, RN 12/31/2012 3:43 PM  Signature:  12/31/2012 3:43 PM  Signature:   12/31/2012 3:43 PM  Signature:    Signature:    Signature:    Signature:    Signature:    Signature:      Scribe for Treatment Team:   Richelle Ito, LCSW  12/31/2012 3:43 PM

## 2012-12-31 NOTE — Progress Notes (Signed)
[facility] Post 1:1 Observation Documentation  For the first (8) hours following discontinuation of 1:1 precautions, a progress note entry by nursing staff should be documented at least every 2 hours, reflecting the patient's behavior, condition, mood, and conversation.  Use the progress notes for additional entries.  Time 1:1 discontinued:  1618  Patient's Behavior:  Patient at the medication window taking his bedtime medication with no proble  Patient's Condition:  Patient calm cooperative.  No complaints at this time.  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  Patient states he needs his eye drops because his vision is declining.  Patietn states he is going to go to bed soon.  Gildardo Griffes 12/31/12 2130

## 2012-12-31 NOTE — [facility] Group Notes (Signed)
[facility] LCSW Aftercare Discharge Planning Group Note   12/31/2012 3:48 PM  Participation Quality:  Engaged  Mood/Affect:  Appropriate  Depression Rating:  Denies depression  Anxiety Rating:  Denies anxiety  Thoughts of Suicide:  No Will you contract for safety?   NA  Current AVH:  No  Plan for Discharge/Comments:  Return home  Transportation Means: unknown  Supports: family  Donta continues to have delusional thoughts.  At this point it appears those will not change during his stay with [place].  Have left msg for daughter-in-law today.  [name]

## 2012-12-31 NOTE — Progress Notes (Signed)
Patient ID: Albert Hess, male   DOB: 1945/11/09, 67 y.o.   MRN: [id number] [facility] MD Progress Note  12/29/2012 9:47 PM LUCIUS Hess  MRN:  [id number] Subjective:"Im doing great."  Objective: Patient is pending a visit from the Wound Care nurse. He is keeping his feet elevated. He continues to be grandiose in his behaviors and tells me he can build computers with his mind. He has "several connections at [employer] who will allow him to do anything he wants to do."  Diagnosis:  Bipolar I disorder, most recent episode (or current) manic  ADL's:  Impaired  Sleep: good  Appetite:  Good  Suicidal Ideation:  denies Homicidal Ideation:  denies AEB (as evidenced by): patient's report of decreased symptoms, improved mood, sleep and appetite.  Psychiatric Specialty Exam: Review of Systems  Constitutional: Negative.  Negative for fever, chills, weight loss, malaise/fatigue and diaphoresis.  HENT: Negative for congestion and sore throat.   Eyes: Negative for blurred vision, double vision and photophobia.  Respiratory: Negative for cough, shortness of breath and wheezing.   Cardiovascular: Negative for chest pain, palpitations and PND.  Gastrointestinal: Negative for heartburn, nausea, vomiting, abdominal pain, diarrhea and constipation.  Musculoskeletal: Negative for myalgias, joint pain and falls.  Neurological: Negative for dizziness, tingling, tremors, sensory change, speech change, focal weakness, seizures, loss of consciousness, weakness and headaches.  Endo/Heme/Allergies: Negative for polydipsia. Does not bruise/bleed easily.  Psychiatric/Behavioral: Positive for substance abuse. Negative for depression, suicidal ideas, hallucinations and memory loss. The patient is not nervous/anxious and does not have insomnia.     Blood pressure 96/67, pulse 108, temperature 98.2 F (36.8 C), temperature source Oral, resp. rate 20, height 5' 10.25" (1.784 m), weight 97.523 kg (215 lb).Body mass index is  30.64 kg/(m^2).  General Appearance: Fairly Groomed  Patent attorney::  Fair  Speech:  Pressured  Volume:  Normal  Mood:  Anxious  Affect:  Grandiosity continues  Thought Process:  Circumstantial  Orientation:  Full (Time, Place, and Person)  Thought Content:  Delusions  Suicidal Thoughts:  No  Homicidal Thoughts:  No  Memory:  Immediate;   Fair  Judgement:  Impaired  Insight:  Present  Psychomotor Activity:  Restlessness  Concentration:  Poor  Recall:  Fair  Akathisia:  No  Handed:  Right  AIMS (if indicated):     Assets:  Social Support  Sleep:  Number of Hours: 3.75   Current Medications: Current Facility-Administered Medications  Medication Dose Route Frequency Provider Last Rate Last Dose  . acetaminophen (TYLENOL) tablet 650 mg  650 mg Oral Q6H PRN Kerry Hough, PA-C      . alum & mag hydroxide-simeth (MAALOX/MYLANTA) 200-200-20 MG/5ML suspension 30 mL  30 mL Oral Q4H PRN Kerry Hough, PA-C   30 mL at 12/29/12 0132  . lisinopril (PRINIVIL,ZESTRIL) tablet 10 mg  10 mg Oral Daily Kerry Hough, PA-C   10 mg at 12/29/12 0752  . magnesium hydroxide (MILK OF MAGNESIA) suspension 30 mL  30 mL Oral Daily PRN Kerry Hough, PA-C      . polyvinyl alcohol (LIQUIFILM TEARS) 1.4 % ophthalmic solution 1 drop  1 drop Both Eyes PRN Karolee Stamps, NP   1 drop at 12/29/12 1346  . risperiDONE (RISPERDAL) tablet 1 mg  1 mg Oral BID PC Mojeed Akintayo   1 mg at 12/29/12 1842  . simvastatin (ZOCOR) tablet 5 mg  5 mg Oral q1800 Kerry Hough, PA-C   5 mg at  12/29/12 1842  . traZODone (DESYREL) tablet 50 mg  50 mg Oral QHS PRN Mojeed Akintayo   50 mg at 12/29/12 0038    Lab Results:  Results for orders placed during the hospital encounter of 12/25/12 (from the past 48 hour(s))  GLUCOSE, CAPILLARY     Status: None   Collection Time    12/28/12  6:11 AM      Result Value Range   Glucose-Capillary 88  70 - 99 mg/dL  GLUCOSE, CAPILLARY     Status: None   Collection Time    12/28/12  11:27 AM      Result Value Range   Glucose-Capillary 97  70 - 99 mg/dL   Comment 1 Documented in Chart     Comment 2 Notify RN    GLUCOSE, CAPILLARY     Status: Abnormal   Collection Time    12/28/12  5:08 PM      Result Value Range   Glucose-Capillary 110 (*) 70 - 99 mg/dL  GLUCOSE, CAPILLARY     Status: None   Collection Time    12/28/12  9:45 PM      Result Value Range   Glucose-Capillary 91  70 - 99 mg/dL   Comment 1 Notify RN    GLUCOSE, CAPILLARY     Status: Abnormal   Collection Time    12/29/12  5:43 AM      Result Value Range   Glucose-Capillary 119 (*) 70 - 99 mg/dL  GLUCOSE, CAPILLARY     Status: Abnormal   Collection Time    12/29/12 11:51 AM      Result Value Range   Glucose-Capillary 133 (*) 70 - 99 mg/dL   Comment 1 Documented in Chart     Comment 2 Notify RN    GLUCOSE, CAPILLARY     Status: None   Collection Time    12/29/12  5:12 PM      Result Value Range   Glucose-Capillary 96  70 - 99 mg/dL   Comment 1 Notify RN     Comment 2 Documented in Chart      Physical Findings: AIMS: Facial and Oral Movements Muscles of Facial Expression: None, normal Lips and Perioral Area: None, normal Jaw: None, normal Tongue: None, normal,Extremity Movements Upper (arms, wrists, hands, fingers): None, normal Lower (legs, knees, ankles, toes): None, normal, Trunk Movements Neck, shoulders, hips: None, normal, Overall Severity Severity of abnormal movements (highest score from questions above): None, normal Incapacitation due to abnormal movements: None, normal Patient's awareness of abnormal movements (rate only patient's report): No Awareness, Dental Status Current problems with teeth and/or dentures?: No Does patient usually wear dentures?: No  CIWA:  CIWA-Ar Total: 1 COWS:     Treatment Plan Summary: Daily contact with patient to assess and evaluate symptoms and progress in treatment Medication management  Plan: 1. Admit for crisis management and  stabilization. 2. Medication management to reduce current symptoms to base line and improve the patient's overall level of functioning. 3. Treat health problems as indicated. 4. Develop treatment plan to decrease risk of relapse upon discharge and to reduce the need for readmission. 5. Psycho-social education regarding relapse prevention and self care. 6. Health care follow up as needed for medical problems. 7. Continue to elevate feet when possible. 8. Continue current medication as written. 9. Skin care RN is in to evaluate and make recommendations. 10.Reviewed medication and will continue current regimen. 11.Will anticipate D/C home tomorrow per MD. CM will contact family  to discuss follow up. 12. As his sodium is normal, will d/c his 1:1. Medical Decision Making Problem Points:  Established problem, stable/improving (1) Data Points:  Review of medication regiment & side effects (2)  I certify that inpatient services furnished can reasonably be expected to improve the patient's condition. [name]. Eino Whitner RPAC 4:17 PM 12/31/2012

## 2012-12-31 NOTE — Consult Note (Signed)
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  Has been seen by orthopedic MD in the past, Dr. Aldean Baker.  Recommend follow up with Dr. Lajoyce Corners after discharge. Wound type:Neuropathic  Pressure Ulcer POA: No Measurement:1st metatarsal head callus measures 3cm x 1cm.  5th metatarsal head callus measures 2.5cm x 2cm.  No ulcerations evident at this time. Wound ZOX:WRUE evident. Drainage (amount, consistency, odor) none Periwound:Dry, peeling skin on bilateral feet.  Suspect some arterial insufficiency bilaterally as skin is hairless in patches.   Dressing procedure/placement/frequency:NMo dressing is indicated, albeit I will write for daily cleansing and gentle pat dry followed by application of white petrolatum (vaseline) ointment to the callused areas once daily.  UIpon discharge, it is recommended that the patient follow up with Dr. Aldean Baker (Ortho MD) for callus paring (he has seen Dr. Lajoyce Corners in the past). I will not follow.  Please re-consult if needed. Thanks, Ladona Mow, MSN, RN, [facility], CWOCN [phone])

## 2012-12-31 NOTE — Progress Notes (Signed)
Observation note: Pt in dayroom with sitter present. Pt denies pain and show no s/s of distress. Pt presents with delusional thoughts and tangential speech. Per pt, he can see things before the happen.  Pt is compliant with meds an attend groups.   Medications administered as ordered per MD. Verbal support given. Pt encouraged to attend groups. Shift assessment completed. Continue to monitor I&O. 15 minute checks performed for safety. 1:1 observation maintained until d/c'd.  Pt is receptive to treatment. Pt presents with flight of ideas.

## 2012-12-31 NOTE — Clinical Social Work Note (Signed)
  Type of Therapy: Process Group Therapy  Participation Level:  Minimal  Participation Quality:  Appropriate  Affect:  Appropriate  Cognitive:  Delusional  Insight:  None  Engagement in Group:  Limited  Engagement in Therapy:  None  Modes of Intervention:  Activity, Clarification, Education, Problem-solving and Support  Summary of Progress/Problems: Today's group addressed the issue of overcoming obstacles.  Patients were asked to identify their biggest obstacle post d/c that stands in the way of their on-going success, and then problem solve as to how to manage this.  Leilan states that he has no obstacles because "I have the whole next 10 years planned out."  "I've got everything figured out."       [name] 12/31/2012   3:50 PM

## 2012-12-31 NOTE — Progress Notes (Signed)
Recreation Therapy Notes  Date: 04.14.2014       Time: 9:30am Location: 400 [address] Day Room      Group Topic/Focus: Self Expression  Participation Level: Active  Participation Quality: Appropriate  Affect: Euthymic  Cognitive: Oriented   Additional Comments: Activity: Emotion Wheel worksheet & "Who Am I" worksheet Explaination: Patients were given an Emotion Wheel worksheet. Worksheet has a circle divided into 7 equal parts. Patients were asked to identify the following emotions through words or pictures: Anxious, Excited, Happy, Hopeful, Productive, Anxious, Angry. Patients were given a "Who Am I" Worksheet. Worksheet has a circle divided into 6 equal parts. Patients were asked to identify what makes them up. Classical music was played in the background to enhance therapeutic environment.   Initially patient stated he was unable to participate in group activity because he did not have glasses with him. Approximately half way through group session patient borrowed glasses from male peer and completed both the Emotion Wheel and Who Am I worksheets provided during group session. Patient shared his Who Am I worksheet with the group. Patient identified his family, friends and money as the primary components he would use to describe himself. Patient stated money was what he was going to share with his family and friends when he passed. Patient interacted with peers and LRT appropriately.    Marykay Lex Adger Cantera, LRT/CTRS   Shadawn Hanaway L 12/31/2012 1:01 PM

## 2012-12-31 NOTE — Progress Notes (Signed)
[facility] Post 1:1 Observation Documentation  For the first (8) hours following discontinuation of 1:1 precautions, a progress note entry by nursing staff should be documented at least every 2 hours, reflecting the patient's behavior, condition, mood, and conversation.  Use the progress notes for additional entries.  Time 1:1 discontinued:  1618  Patient's Behavior:  Pt standing in hallway waiting to take medicine. Pt compliant with medication administration. Pt is cooperative and pleasant.  Patient's Condition:  Pt denies pain and show no s/s of distress.  Patient's Conversation:  Pt speech is logical/coherent at this time.  [place] Mielke L 12/31/2012, 5:30 PM

## 2012-12-31 NOTE — Progress Notes (Signed)
Adult Psychoeducational Group Note  Date:  12/31/2012 Time:  4:51 AM  Group Topic/Focus:  Wrap-Up Group:   The focus of this group is to help patients review their daily goal of treatment and discuss progress on daily workbooks.  Participation Level:  Active  Participation Quality:  Appropriate  Affect:  Appropriate  Cognitive:  Appropriate  Insight: Lacking  Engagement in Group:  Improving  Modes of Intervention:  Support  Additional Comments:  Patient attended and participated in group tonight. He reports having a great day today. He talked to his doctor, went outside, went down to the cafeteria for food. He advised that his son was his greatest support system.  Lita Mains [facility] 12/31/2012, 4:51 AM

## 2013-01-01 LAB — GLUCOSE, CAPILLARY
Glucose-Capillary: 89 mg/dL (ref 70–99)
Glucose-Capillary: 117 mg/dL — ABNORMAL HIGH (ref 70–99)
Glucose-Capillary: 98 mg/dL (ref 70–99)

## 2013-01-01 MED ORDER — HYDROXYZINE HCL 25 MG PO TABS
25.0000 mg | ORAL_TABLET | Freq: Four times a day (QID) | ORAL | Status: DC | PRN
  Administered 2013-01-01 – 2013-01-10 (×4): 25 mg via ORAL

## 2013-01-01 MED ORDER — RISPERIDONE 2 MG PO TABS
2.0000 mg | ORAL_TABLET | Freq: Every day | ORAL | Status: DC
  Administered 2013-01-01 – 2013-01-02 (×2): 2 mg via ORAL
  Filled 2013-01-01 (×3): qty 1

## 2013-01-01 MED ORDER — RISPERIDONE 1 MG PO TABS
1.0000 mg | ORAL_TABLET | Freq: Every day | ORAL | Status: DC
  Administered 2013-01-02 – 2013-01-03 (×2): 1 mg via ORAL
  Filled 2013-01-01 (×3): qty 1

## 2013-01-01 MED ORDER — TRAZODONE HCL 50 MG PO TABS
50.0000 mg | ORAL_TABLET | Freq: Every day | ORAL | Status: DC
  Administered 2013-01-01: 50 mg via ORAL
  Filled 2013-01-01 (×2): qty 1

## 2013-01-01 NOTE — Progress Notes (Signed)
D: Pt denies SI/HI/AVH. Pt is delusional and presents with mild confusion. Pt agitated at times during the shift and need to be redirected by staff. Per pt, he drew a painting that's hanging up on the wall in the dayroom. Pt then accused staff of stealing his chair out of his room. Pt is compliant with treatment.  A: Medications administered as ordered per MD. Verbal support given. Pt encouraged to attend groups. 15 minute checks performed for safety. Shift assessment completed.  R: Pt speech is hyperverbal with a flight of ideas.

## 2013-01-01 NOTE — Progress Notes (Signed)
[facility] Post 1:1 Observation Documentation  For the first (8) hours following discontinuation of 1:1 precautions, a progress note entry by nursing staff should be documented at least every 2 hours, reflecting the patient's behavior, condition, mood, and conversation.  Use the progress notes for additional entries.  Time 1:1 discontinued:  1618  Patient's Behavior:  Patient resting in bed with eyes closed.  Patient respirations even and unlabored.  Patient appears to be in no apparent distress.  Patient's Condition:  Patient appears to be in no apparent distress.  Patient's Conversation:  No conversation between Clinical research associate and patient asleep.  Gildardo Griffes 01/01/2013 0130

## 2013-01-01 NOTE — Progress Notes (Signed)
D: Patient in the hallway on approach.  Patient disorganised, tangential and appears more confused.  Pant has been talking to walls and doing repetative motions along the walls in his room.  Patient is oriented to person but unclear of the date and where he is.  Patient appears to be steady on both feet and writer observed him using the hand rails in the halls.  Patient states he only sleeps for 10 mins at a time and that is all he needs.  Patient exhibiting bizarre behaviors for example doing exercises at the med window, wanting his arm band scanned several times, sticking his head through the medication window.  Patient can be redirected but staff has to guide him and tell him several times.  Patient nit harmful to himself or others.  [name] and denies AVH. A: Staff to monitor Q 15 mins for safety.  Encouragement and support offered.  Scheduled medications administered per orders. R: Patient remains safe on the unit.  Patient did not attend group tonight.  Patient taking administered medications.

## 2013-01-01 NOTE — Progress Notes (Signed)
Psychoeducational Group Note  Date:  01/01/2013 Time:  2000  Group Topic/Focus:  Wrap-Up Group:   The focus of this group is to help patients review their daily goal of treatment and discuss progress on daily workbooks.  Participation Level: Did Not Attend  Participation Quality:  Not Applicable  Affect:  Not Applicable  Cognitive:  Not Applicable  Insight:  Not Applicable  Engagement in Group: Not Applicable  Additional Comments:  Pt did not attend  Christ Kick 01/01/2013, 10:16 PM

## 2013-01-01 NOTE — Clinical Social Work Note (Signed)
[facility] LCSW Group Therapy  01/01/2013 , 6:21 PM   Type of Therapy:  Group Therapy  Participation Level:  Active  Participation Quality:  Attentive  Affect:  Appropriate  Cognitive:  Alert  Insight:  None  Engagement in Therapy:  Engaged  Modes of Intervention:  Discussion, Exploration and Socialization  Summary of Progress/Problems: Today's group focused on the term Diagnosis.  Participants were asked to define the term, and then pronounce whether it is a negative, positive or neutral term. Albert Hess eagerly participated in his delusional, confused manner.  "I don't need a diagnosis from a Dr.  I know all things."  Talked about stigma when others brought it up, but siad it was related to a stye-"like when your eye gets infected and then you get treatment."  [name] 01/01/2013 , 6:21 PM

## 2013-01-01 NOTE — Progress Notes (Signed)
[facility] Post 1:1 Observation Documentation  For the first (8) hours following discontinuation of 1:1 precautions, a progress note entry by nursing staff should be documented at least every 2 hours, reflecting the patient's behavior, condition, mood, and conversation.  Use the progress notes for additional entries.  Time 1:1 discontinued:  1618  Patient's Behavior:  Patient in bed resting with eyes closed.  Patient's Condition:  Patient appears to be in no apparent distress  Patient's Conversation:  No conversation between Clinical research associate because patient is asleep.  Albert Hess 12/31/2012 2330

## 2013-01-01 NOTE — Progress Notes (Signed)
Patient ID: Albert Hess, male   DOB: 1945/11/22, 67 y.o.   MRN: [id number] [facility] MD Progress Note  12/29/2012 9:47 PM Albert Hess  MRN:  [id number] Subjective: I'm doing great! I'm ready to go!  Objective: Lynda continues to be grandiose and hypo manic. He is not sleeping well despite his reports of "sleeping like a rock." He was being considered for discharge. As a result several angry and profane calls were received by the staff from [facility] family. They are concerned about his stability and do not feel that he is able to be discharged home. Albert Hess "his son" feels that he needs assisted living and want the hospital to make that happen. Diagnosis:  Bipolar I disorder, most recent episode (or current) manic  ADL's:  Impaired  Sleep: good  Appetite:  Good  Suicidal Ideation:  denies Homicidal Ideation:  denies AEB (as evidenced by): patient's report of decreased symptoms, improved mood, sleep and appetite.  Psychiatric Specialty Exam: Review of Systems  Constitutional: Negative.  Negative for fever, chills, weight loss, malaise/fatigue and diaphoresis.  HENT: Negative for congestion and sore throat.   Eyes: Negative for blurred vision, double vision and photophobia.  Respiratory: Negative for cough, shortness of breath and wheezing.   Cardiovascular: Negative for chest pain, palpitations and PND.  Gastrointestinal: Negative for heartburn, nausea, vomiting, abdominal pain, diarrhea and constipation.  Musculoskeletal: Negative for myalgias, joint pain and falls.  Neurological: Negative for dizziness, tingling, tremors, sensory change, speech change, focal weakness, seizures, loss of consciousness, weakness and headaches.  Endo/Heme/Allergies: Negative for polydipsia. Does not bruise/bleed easily.  Psychiatric/Behavioral: Positive for substance abuse. Negative for depression, suicidal ideas, hallucinations and memory loss. The patient is not nervous/anxious and does not have insomnia.     Blood  pressure 96/67, pulse 108, temperature 98.2 F (36.8 C), temperature source Oral, resp. rate 20, height 5' 10.25" (1.784 m), weight 97.523 kg (215 lb).Body mass index is 30.64 kg/(m^2).  General Appearance: Fairly Groomed  Patent attorney::  Fair  Speech:  Pressured  Volume:  Normal  Mood:  Anxious  Affect:  Grandiosity continues  Thought Process:  Circumstantial, manic, delusional  Orientation:  Full (Time, Place, and Person)  Thought Content:  Delusions "I painted that picture in the hallway."  Suicidal Thoughts:  No  Homicidal Thoughts:  No  Memory:  Immediate;   Fair  Judgement:  Impaired  Insight:  Present  Psychomotor Activity:  Restlessness  Concentration:  Poor  Recall:  Fair  Akathisia:  No  Handed:  Right  AIMS (if indicated):     Assets:  Social Support  Sleep:  Number of Hours: 3.75   Current Medications: Current Facility-Administered Medications  Medication Dose Route Frequency Provider Last Rate Last Dose  . acetaminophen (TYLENOL) tablet 650 mg  650 mg Oral Q6H PRN Kerry Hough, PA-C      . alum & mag hydroxide-simeth (MAALOX/MYLANTA) 200-200-20 MG/5ML suspension 30 mL  30 mL Oral Q4H PRN Kerry Hough, PA-C   30 mL at 12/29/12 0132  . lisinopril (PRINIVIL,ZESTRIL) tablet 10 mg  10 mg Oral Daily Kerry Hough, PA-C   10 mg at 12/29/12 0752  . magnesium hydroxide (MILK OF MAGNESIA) suspension 30 mL  30 mL Oral Daily PRN Kerry Hough, PA-C      . polyvinyl alcohol (LIQUIFILM TEARS) 1.4 % ophthalmic solution 1 drop  1 drop Both Eyes PRN Karolee Stamps, NP   1 drop at 12/29/12 1346  . risperiDONE (RISPERDAL)  tablet 1 mg  1 mg Oral BID PC Mojeed Akintayo   1 mg at 12/29/12 1842  . simvastatin (ZOCOR) tablet 5 mg  5 mg Oral q1800 Kerry Hough, PA-C   5 mg at 12/29/12 1842  . traZODone (DESYREL) tablet 50 mg  50 mg Oral QHS PRN Mojeed Akintayo   50 mg at 12/29/12 0038    Lab Results:  Results for orders placed during the hospital encounter of 12/25/12 (from  the past 48 hour(s))  GLUCOSE, CAPILLARY     Status: None   Collection Time    12/28/12  6:11 AM      Result Value Range   Glucose-Capillary 88  70 - 99 mg/dL  GLUCOSE, CAPILLARY     Status: None   Collection Time    12/28/12 11:27 AM      Result Value Range   Glucose-Capillary 97  70 - 99 mg/dL   Comment 1 Documented in Chart     Comment 2 Notify RN    GLUCOSE, CAPILLARY     Status: Abnormal   Collection Time    12/28/12  5:08 PM      Result Value Range   Glucose-Capillary 110 (*) 70 - 99 mg/dL  GLUCOSE, CAPILLARY     Status: None   Collection Time    12/28/12  9:45 PM      Result Value Range   Glucose-Capillary 91  70 - 99 mg/dL   Comment 1 Notify RN    GLUCOSE, CAPILLARY     Status: Abnormal   Collection Time    12/29/12  5:43 AM      Result Value Range   Glucose-Capillary 119 (*) 70 - 99 mg/dL  GLUCOSE, CAPILLARY     Status: Abnormal   Collection Time    12/29/12 11:51 AM      Result Value Range   Glucose-Capillary 133 (*) 70 - 99 mg/dL   Comment 1 Documented in Chart     Comment 2 Notify RN    GLUCOSE, CAPILLARY     Status: None   Collection Time    12/29/12  5:12 PM      Result Value Range   Glucose-Capillary 96  70 - 99 mg/dL   Comment 1 Notify RN     Comment 2 Documented in Chart      Physical Findings: AIMS: Facial and Oral Movements Muscles of Facial Expression: None, normal Lips and Perioral Area: None, normal Jaw: None, normal Tongue: None, normal,Extremity Movements Upper (arms, wrists, hands, fingers): None, normal Lower (legs, knees, ankles, toes): None, normal, Trunk Movements Neck, shoulders, hips: None, normal, Overall Severity Severity of abnormal movements (highest score from questions above): None, normal Incapacitation due to abnormal movements: None, normal Patient's awareness of abnormal movements (rate only patient's report): No Awareness, Dental Status Current problems with teeth and/or dentures?: No Does patient usually wear  dentures?: No  CIWA:  CIWA-Ar Total: 1 COWS:     Treatment Plan Summary: Daily contact with patient to assess and evaluate symptoms and progress in treatment Medication management  Plan: 1. Admit for crisis management and stabilization. 2. Medication management to reduce current symptoms to base line and improve the patient's overall level of functioning. 3. Treat health problems as indicated. 4. Develop treatment plan to decrease risk of relapse upon discharge and to reduce the need for readmission. 5. Psycho-social education regarding relapse prevention and self care. 6. Health care follow up as needed for medical problems. 7. Continue to  elevate feet when possible. 8. Continue current medication as written. 9. After discussion with MD and CM, reported family concerns.  Medication will be changed to reflect a higher dose at bedtime.  His trazodone will be made regular medication, not prn. 10. Will also recommend a Home Health RN consult upon discharge to facilitate follow up on medication management, wound care management. 11. Will also recommend a SW consult as well upon discharge to facilitate family support and to assist with placement if family wishes to pay out of pocket. 12. Will continue to monitor as noted. 13. Also spoke with patient's Daughter [name] who was very angry rude, and profane to RN this morning. I explained that we are happy to speak with a family member each day, but not EVERY family member each day as time and patient care would not allow this. I also encouraged her not to curse at our staff as it does not help the situation.   Medical Decision Making Problem Points:  Established problem, stable/improving (1) Data Points:  Review of medication regiment & side effects (2)  I certify that inpatient services furnished can reasonably be expected to improve the patient's condition. [name]. Dorris Pierre RPAC 1:43 PM 01/01/2013

## 2013-01-01 NOTE — [facility] Group Notes (Signed)
[facility] LCSW Aftercare Discharge Planning Group Note   01/01/2013 6:19 PM  Participation Quality:  Engaged  Mood/Affect:  Appropriate  Depression Rating:  unknown  Anxiety Rating:  unknown  Thoughts of Suicide:  No Will you contract for safety?   NA  Current AVH:  No  Plan for Discharge/Comments:  Return home, made referral to ACT team with pt's consent  Transportation Means: public transpo.  Supports: Family  "It's always good to see you for the first group.  Have a great day."  [name], [place] B

## 2013-01-02 MED ORDER — TRAZODONE HCL 50 MG PO TABS
75.0000 mg | ORAL_TABLET | Freq: Every day | ORAL | Status: DC
  Administered 2013-01-02: 75 mg via ORAL
  Filled 2013-01-02 (×2): qty 1
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
## 2013-01-02 NOTE — Progress Notes (Signed)
Patient was up approx. 2 am.  Patient was in his room talking to himself and peeping out of his room and feeling the "reading the braile" on his room door sign.  Patient bizarre and appeared confused.  Patient had to be redirected several times.  Patient finally went back to sleep and awoke this am around 0630 and patient speech clearer.  Patient remains delusional but does not appear to be confused.  Patient appears more coherent this am.

## 2013-01-02 NOTE — Progress Notes (Signed)
Date: 01/02/2013  Time: 11:39 AM  Group Topic/Focus:  Crisis Planning: The purpose of this group is to help patients create a crisis plan for use upon discharge or in the future, as needed.  Participation Level: Active  Participation Quality: Appropriate, Sharing and Supportive  Affect: Anxious and Appropriate  Cognitive: Appropriate  Insight: Appropriate  Engagement in Group: Engaged and Supportive  Modes of Intervention: Education, Problem-solving and Support  Additional Comments: none  Naevia Unterreiner M  01/02/2013, 11:39 AM  

## 2013-01-02 NOTE — Progress Notes (Signed)
D: Pt denies SI/HI/AVH. Pt denies pain and show no s/s of distress. Pt continues to be delusional and mildly confused. Pt is compliant with taking meds and attending group. Pt continues to need redirecting by staff. Pt is redirectable. Pt is pleasant and cooperative.   A: Medications administered as ordered per MD. Verbal support given. Pt encouraged to attend groups. 15 minute checks performed for safety. Pt reoriented by staff as needed. Shift assessment completed.  R: Pt is manic. Hyper-verbal.

## 2013-01-02 NOTE — [facility] Group Notes (Signed)
[facility] LCSW Group Therapy  01/02/2013 1:28 PM  Type of Therapy:  Group Therapy  Participation Level:  Did Not Attend  Smart, [name] 01/02/2013, 1:28 PM

## 2013-01-02 NOTE — Progress Notes (Signed)
Recreation Therapy Notes  Date: 04.16.2014  Time: 9:30am  Location: [address]   Group Topic/Focus: Decision Making   Participation Level:  Did not attend  Jearl Klinefelter, LRT/CTRS  Jearl Klinefelter 01/02/2013 1:13 PM

## 2013-01-02 NOTE — Progress Notes (Signed)
Patient ID: Albert Hess, male   DOB: 08-29-1946, 67 y.o.   MRN: [id number] [facility] MD Progress Note  01/02/2013 9:23 AM DEMICO Hess  MRN:  [id number] Subjective:  "When can I go home?" Objective: Patient appears to be less agitated today but he remains delusional and manic. He states that he slept very well last night but staffs reports that he only slept for 3 hours. He is compliant with his medication and did not verbalize any adverse reactions. Lab reviewed and current Na level is 139.  Diagnosis:  Bipolar I disorder, most recent episode (or current) manic   ADL's:  Impaired  Sleep: poor  Appetite:  Good  Suicidal Ideation:  denies Homicidal Ideation:  denies AEB (as evidenced by):  Psychiatric Specialty Exam: Review of Systems  Constitutional: Negative.  Negative for fever, chills, weight loss, malaise/fatigue and diaphoresis.  HENT: Negative for congestion and sore throat.   Eyes: Negative for blurred vision, double vision and photophobia.  Respiratory: Negative for cough, shortness of breath and wheezing.   Cardiovascular: Negative for chest pain, palpitations and PND.  Gastrointestinal: Negative for heartburn, nausea, vomiting, abdominal pain, diarrhea and constipation.  Musculoskeletal: Negative for myalgias, joint pain and falls.  Neurological: Negative for dizziness, tingling, tremors, sensory change, speech change, focal weakness, seizures, loss of consciousness, weakness and headaches.  Endo/Heme/Allergies: Negative for polydipsia. Does not bruise/bleed easily.  Psychiatric/Behavioral: Negative for depression, suicidal ideas, hallucinations, memory loss and substance abuse. The patient is not nervous/anxious and does not have insomnia.     Blood pressure 117/84, pulse 110, temperature 98 F (36.7 C), temperature source Oral, resp. rate 18, height 5' 10.25" (1.784 m), weight 97.523 kg (215 lb).Body mass index is 30.64 kg/(m^2).  General Appearance: Fairly Groomed  Proofreader::  Fair  Speech:  Pressured  Volume:  Normal  Mood:  Less irritable  Affect:  Full range  Thought Process:  Circumstantial  Orientation:  Full (Time, Place, and Person)  Thought Content:  Delusions  Suicidal Thoughts:  No  Homicidal Thoughts:  No  Memory:  Immediate;   Fair  Judgement:  Impaired  Insight:  Present  Psychomotor Activity:  Restlessness  Concentration:  Poor  Recall:  Fair  Akathisia:  No  Handed:  Right  AIMS (if indicated):     Assets:  Social Support  Sleep:  Number of Hours: 3   Current Medications: Current Facility-Administered Medications  Medication Dose Route Frequency Provider Last Rate Last Dose  . acetaminophen (TYLENOL) tablet 650 mg  650 mg Oral Q6H PRN Kerry Hough, PA-C      . alum & mag hydroxide-simeth (MAALOX/MYLANTA) 200-200-20 MG/5ML suspension 30 mL  30 mL Oral Q4H PRN Kerry Hough, PA-C   30 mL at 12/29/12 0132  . hydrOXYzine (ATARAX/VISTARIL) tablet 25 mg  25 mg Oral Q6H PRN Sanjuana Kava, NP   25 mg at 01/01/13 1644  . lisinopril (PRINIVIL,ZESTRIL) tablet 10 mg  10 mg Oral Daily Kerry Hough, PA-C   10 mg at 01/02/13 7829  . magnesium hydroxide (MILK OF MAGNESIA) suspension 30 mL  30 mL Oral Daily PRN Kerry Hough, PA-C      . polyvinyl alcohol (LIQUIFILM TEARS) 1.4 % ophthalmic solution 1 drop  1 drop Both Eyes PRN Karolee Stamps, NP   1 drop at 01/02/13 [phone]  . risperiDONE (RISPERDAL) tablet 1 mg  1 mg Oral Daily Verne Spurr, PA-C   1 mg at 01/02/13 0815  . risperiDONE (  RISPERDAL) tablet 2 mg  2 mg Oral QHS Verne Spurr, PA-C   2 mg at 01/01/13 2149  . simvastatin (ZOCOR) tablet 5 mg  5 mg Oral q1800 Kerry Hough, PA-C   5 mg at 01/01/13 1644  . traZODone (DESYREL) tablet 50 mg  50 mg Oral QHS Verne Spurr, PA-C   50 mg at 01/01/13 2149  . white petrolatum (VASELINE) gel   Topical Daily Abelina Ketron   1 application at 01/02/13 0815    Lab Results:  Results for orders placed during the hospital encounter of  12/25/12 (from the past 48 hour(s))  GLUCOSE, CAPILLARY     Status: Abnormal   Collection Time    12/31/12 11:36 AM      Result Value Range   Glucose-Capillary 106 (*) 70 - 99 mg/dL  GLUCOSE, CAPILLARY     Status: None   Collection Time    12/31/12  5:03 PM      Result Value Range   Glucose-Capillary 99  70 - 99 mg/dL   Comment 1 Documented in Chart     Comment 2 Notify RN    GLUCOSE, CAPILLARY     Status: None   Collection Time    01/01/13  6:03 AM      Result Value Range   Glucose-Capillary 89  70 - 99 mg/dL  GLUCOSE, CAPILLARY     Status: Abnormal   Collection Time    01/01/13 11:52 AM      Result Value Range   Glucose-Capillary 117 (*) 70 - 99 mg/dL  GLUCOSE, CAPILLARY     Status: None   Collection Time    01/01/13  5:02 PM      Result Value Range   Glucose-Capillary 98  70 - 99 mg/dL  GLUCOSE, CAPILLARY     Status: None   Collection Time    01/02/13  6:39 AM      Result Value Range   Glucose-Capillary 76  70 - 99 mg/dL    Physical Findings: AIMS: Facial and Oral Movements Muscles of Facial Expression: None, normal Lips and Perioral Area: None, normal Jaw: None, normal Tongue: None, normal,Extremity Movements Upper (arms, wrists, hands, fingers): None, normal Lower (legs, knees, ankles, toes): None, normal, Trunk Movements Neck, shoulders, hips: None, normal, Overall Severity Severity of abnormal movements (highest score from questions above): None, normal Incapacitation due to abnormal movements: None, normal Patient's awareness of abnormal movements (rate only patient's report): No Awareness, Dental Status Current problems with teeth and/or dentures?: No Does patient usually wear dentures?: No  CIWA:  CIWA-Ar Total: 1 COWS:     Treatment Plan Summary: Daily contact with patient to assess and evaluate symptoms and progress in treatment Medication management  Plan: 1. Admit for crisis management and stabilization. 2. Medication management to reduce  current symptoms to base line and improve the patient's overall level of functioning. 3. Treat health problems as indicated. 4. Develop treatment plan to decrease risk of relapse upon discharge and to reduce the need for readmission. 5. Psycho-social education regarding relapse prevention and self care. 6. Health care follow up as needed for medical problems. 7. Restart home medications where appropriate. 8. Continue current medication as written. 9. ELOS: 3-5 days  10. Continue Risperdal 1mg  po qam and 2mg  at bedtime. 11. Increase Trazodone to 50mg  po qhs for Insomnia. Medical Decision Making Problem Points:  Established problem, stable/improving (1) Data Points:  Review of medication regiment & side effects (2)  I certify that inpatient  services furnished can reasonably be expected to improve the patient's condition.  Thedore Mins, MD 9:23 AM 01/02/2013

## 2013-01-02 NOTE — Progress Notes (Signed)
D: Patient keeps coming out of his room with no shirt on.  Patient delusional and confused.  Patient irritable and will not cooperate on approach.  Patient keeps pushing the call bell in his room but does not need anything.  Patient states he is pushing it to hear himself talk.  Patient is loud and disruptive.  Patient will agree to stay in his room but he comes out to look out the window at the end of the hallway.  Dustin MHT was able to get patient to put on his shirt and patient came out of his room for his night time medications.  Patient more pleasant but appears anxious and continues to be delusional and confused.  [name] and denies AVH. A: Staff to monitor Q 15 mins for safety.  Encouragement and support offered.  Scheduled medications administered per orders.  Vistaril administered for anxiety R: Patient remains safe on the unit.  Patient did not attend group tonight.  Patient taking administered medications.
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
## 2013-01-03 DIAGNOSIS — F54 Psychological and behavioral factors associated with disorders or diseases classified elsewhere: Secondary | ICD-10-CM

## 2013-01-03 DIAGNOSIS — E871 Hypo-osmolality and hyponatremia: Secondary | ICD-10-CM

## 2013-01-03 DIAGNOSIS — R404 Transient alteration of awareness: Secondary | ICD-10-CM

## 2013-01-03 DIAGNOSIS — E119 Type 2 diabetes mellitus without complications: Secondary | ICD-10-CM

## 2013-01-03 DIAGNOSIS — R631 Polydipsia: Secondary | ICD-10-CM

## 2013-01-03 LAB — COMPREHENSIVE METABOLIC PANEL
ALT: 13 U/L (ref 0–53)
AST: 17 U/L (ref 0–37)
Alkaline Phosphatase: 60 U/L (ref 39–117)
CO2: 24 mEq/L (ref 19–32)
Chloride: 106 mEq/L (ref 96–112)
Creatinine, Ser: 0.99 mg/dL (ref 0.50–1.35)
GFR calc non Af Amer: 84 mL/min — ABNORMAL LOW (ref >90)
Potassium: 5 mEq/L (ref 3.5–5.1)
Total Bilirubin: 0.3 mg/dL (ref 0.3–1.2)

## 2013-01-03 LAB — CBC
HCT: 32.2 % — ABNORMAL LOW (ref 39.0–52.0)
MCV: 96.7 fL (ref 78.0–100.0)
Platelets: 157 10*3/uL (ref 150–400)
RBC: 3.33 MIL/uL — ABNORMAL LOW (ref 4.22–5.81)
WBC: 3.8 10*3/uL — ABNORMAL LOW (ref 4.0–10.5)

## 2013-01-03 LAB — PHOSPHORUS: Phosphorus: 4.2 mg/dL (ref 2.3–4.6)

## 2013-01-03 LAB — GLUCOSE, CAPILLARY
Glucose-Capillary: 79 mg/dL (ref 70–99)
Glucose-Capillary: 120 mg/dL — ABNORMAL HIGH (ref 70–99)

## 2013-01-03 MED ORDER — RISPERIDONE 2 MG PO TABS
2.0000 mg | ORAL_TABLET | Freq: Two times a day (BID) | ORAL | Status: DC
  Administered 2013-01-03 – 2013-01-07 (×8): 2 mg via ORAL
  Filled 2013-01-03 (×11): qty 1

## 2013-01-03 MED ORDER — RISPERIDONE 2 MG PO TABS: 2.0000 mg | ORAL_TABLET | Freq: Every day | ORAL | Status: DC

## 2013-01-03 MED ORDER — TRAZODONE HCL 100 MG PO TABS
100.0000 mg | ORAL_TABLET | Freq: Every day | ORAL | Status: DC
  Administered 2013-01-03 – 2013-01-06 (×4): 100 mg via ORAL
  Filled 2013-01-03 (×5): qty 1

## 2013-01-03 NOTE — Progress Notes (Signed)
D: Patient more calm and cooperative tonight.  Patient states he feels good.  Patient appears less confused tonight. Patient laughing and making jokes with staff [name] and denies AVH. A: Staff to monitor Q 15 mins for safety.  Encouragement and support offered.  Scheduled medcations administered per orders. R: Patient remains safe on the unit.  Patient attended [employer].  Patient taking administered medications.

## 2013-01-03 NOTE — Consult Note (Signed)
[facility] Medical Consultation  Albert Hess:[id number] DOB: 04-21-1946 DOA: 12/25/2012 PCP: Bradd Burner, PA-C   Requesting physician: Dr Jannifer Franklin Date of consultation: 01/03/13 Reason for consultation: delirium  Impression/Recommendations Principal Problem:   Bipolar I disorder, most recent episode (or current) manic Active Problems:   Hyponatremia   Diabetes   Psychogenic polydipsia   Ulcer of foot    1. Confusion/ delirious: unclear etiology. Possibly from medications vs early dementia.  We will rule out metabolic encephalopathy . Obtain basic labs including a cbc and a CMP with magnesium and phosphorus. cbg is 120. Repetitive speech but no other focal deficits.   2. Psychogenic polydipsia: 1500 ml fluid restriction.   3. Diabetes mellitus;  CBG (last 3)   Recent Labs  01/03/13 0626 01/03/13 1134 01/03/13 1717  GLUCAP 79 100* 120*    Very well controlled.   4. Bipolar disorder: currently not  Manic. Management per psychiatry.    I will followup again tomorrow. Please contact me if I can be of assistance in the meanwhile. Thank you for this consultation.    HPI:  Albert Hess is an 67 y.o. male with a past medical history of bipolar 2 disorder, posttraumatic stress disorder, schizophrenia, hypertension, hypercholesterolemia and diabetes who was admitted to behavioral health on 12/26/2012. He is on Risperdal and lisinopril as well as Pravachol but no diuretics or SSRI therapy. Internal medicine has been asked to evaluate him for an episode of confusion.The patient freely admits to drinking large quantities of fluids. Patient's diabetes has been well-controlled since he experienced significant weight loss. His hemoglobin A1c was most recently 5.6%   Review of Systems:  Constitutional: No fever, no chills; Appetite normal; No weight loss, no weight gain. HEENT: No blurry vision, no diplopia, no pharyngitis, no dysphagia CV: No chest pain, no  palpitations. Resp: No SOB, no cough. GI: No nausea, no vomiting, no diarrhea, no melena, no hematochezia. GU: No dysuria, no hematuria. MSK: no myalgias, no arthralgias. Neuro: No headache, no focal neurological deficits, no history of seizures. Psych: + depression, + anxiety. Endo: No thyroid disease, + DM, no heat intolerance, no cold intolerance, no polyuria, no polydipsia Skin: No rashes, no skin lesions. Heme: No easy bruising, no history of blood diseases.   Past Medical History  Diagnosis Date  . Mental disorder   . PTSD (post-traumatic stress disorder)   . Bipolar 2 disorder   . Schizophrenia   . Hypertension   . High cholesterol   . Diabetes mellitus without complication    Past Surgical History  Procedure Laterality Date  . Right side      pt points to R side for surgery   Social History:  reports that he has been smoking Cigarettes.  He has been smoking about 0.00 packs per day. He does not have any smokeless tobacco history on file. He reports that  drinks alcohol. He reports that he uses illicit drugs (Marijuana).  No Known Allergies History reviewed. No pertinent family history.  Prior to Admission medications   Medication Sig Start Date End Date Taking? Authorizing Provider  lisinopril (PRINIVIL,ZESTRIL) 10 MG tablet Take 1 tablet (10 mg total) by mouth daily. 12/25/12  Yes Rhetta Mura, MD  pravastatin (PRAVACHOL) 40 MG tablet Take 40 mg by mouth daily.   Yes Historical Provider, MD  risperiDONE (RISPERDAL) 0.5 MG tablet Take 3 tablets (1.5 mg total) by mouth daily. 12/26/12  Yes Rhetta Mura, MD   Physical Exam: Blood pressure 109/65, pulse 105,  temperature 97.4 F (36.3 C), temperature source Oral, resp. rate 17, height 5' 10.25" (1.784 m), weight 97.523 kg (215 lb). Filed Vitals:   01/02/13 0828 01/02/13 0829 01/03/13 0807 01/03/13 0808  BP: 127/76 117/84 120/70 109/65  Pulse: 96 110 94 105  Temp: 98 F (36.7 C)  97.4 F (36.3 C)   TempSrc: Oral   Oral   Resp: 18  17   Height:      Weight:        Constitutional: Vital signs reviewed.  Patient is a well-developed and well-nourished  in no acute distress and cooperative with exam. Alert  And oriented to person only. Not oriented to place or time.  Head: Normocephalic and atraumatic Mouth: no erythema or exudates, MMM Eyes: PERRL, EOMI, conjunctivae normal, No scleral icterus.  Neck: Supple, Trachea midline normal ROM, No JVD, mass, thyromegaly, or carotid bruit present.  Cardiovascular: RRR, S1 normal, S2 normal, no MRG, pulses symmetric and intact bilaterally Pulmonary/Chest: CTAB, no wheezes, rales, or rhonchi Abdominal: Soft. Non-tender, non-distended, bowel sounds are normal, no masses, organomegaly, or guarding present.  Musculoskeletal: No joint deformities, erythema, or stiffness, ROM full and no nontender Neurological: A lert but very confused. Strength is normal and symmetric bilaterally, cranial nerve II-XII are grossly intact, no focal motor deficit,   Skin: vitiligo on the dorsal aspects of the upper and lower extremities.  Psych: repetitive, flat effect and confused.     Labs on Admission:  Basic Metabolic Panel:  Recent Labs Lab 12/31/12 0620  NA 139  K 4.9  CL 105  CO2 25  GLUCOSE 104*  BUN 28*  CREATININE 0.95  CALCIUM 9.3   Liver Function Tests: No results found for this basename: AST, ALT, ALKPHOS, BILITOT, PROT, ALBUMIN,  in the last 168 hours No results found for this basename: LIPASE, AMYLASE,  in the last 168 hours No results found for this basename: AMMONIA,  in the last 168 hours CBC: No results found for this basename: WBC, NEUTROABS, HGB, HCT, MCV, PLT,  in the last 168 hours Cardiac Enzymes: No results found for this basename: CKTOTAL, CKMB, CKMBINDEX, TROPONINI,  in the last 168 hours BNP: No components found with this basename: POCBNP,  CBG:  Recent Labs Lab 01/02/13 1200 01/02/13 1705 01/03/13 0626 01/03/13 1134 01/03/13 1717   GLUCAP 70 94 79 100* 120*    Radiological Exams on Admission: No results found.    [facility] Pager [phone]  If 7PM-7AM, please contact night-coverage [email or web address] Password [email or web address] 01/03/2013, 6:24 PM

## 2013-01-03 NOTE — Progress Notes (Addendum)
[facility] MD Progress Note  01/03/2013 1:24 PM Albert Hess  MRN:  [id number] Subjective:  "I'm pretty good." "Today is the 17th tomorrow is the 20th." "I've been here 5 weeks, what's one more day." Objective: [place] is confused, disorganized, focused on "military time" not oriented to day, date or day of the week.  Diagnosis:  Bipolar I disorder, most recent episode (or current) manic   ADL's:  Impaired  Sleep: Poor 4.25 hours reported  Appetite:  Always good.  Suicidal Ideation:  denies Homicidal Ideation:  denies AEB (as evidenced by): Patient's report of improved symptoms, sleep, appetite, mood and affect.  Psychiatric Specialty Exam: Review of Systems  Constitutional: Negative.  Negative for fever, chills, weight loss, malaise/fatigue and diaphoresis.  HENT: Negative for congestion and sore throat.   Eyes: Negative for blurred vision, double vision and photophobia.  Respiratory: Negative for cough, shortness of breath and wheezing.   Cardiovascular: Negative for chest pain, palpitations and PND.  Gastrointestinal: Negative for heartburn, nausea, vomiting, abdominal pain, diarrhea and constipation.  Musculoskeletal: Negative for myalgias, joint pain and falls.  Neurological: Negative for dizziness, tingling, tremors, sensory change, speech change, focal weakness, seizures, loss of consciousness, weakness and headaches.  Endo/Heme/Allergies: Negative for polydipsia. Does not bruise/bleed easily.  Psychiatric/Behavioral: Negative for depression, suicidal ideas, hallucinations, memory loss and substance abuse. The patient is not nervous/anxious and does not have insomnia.     Blood pressure 109/65, pulse 105, temperature 97.4 F (36.3 C), temperature source Oral, resp. rate 17, height 5' 10.25" (1.784 m), weight 97.523 kg (215 lb).Body mass index is 30.64 kg/(m^2).  General Appearance: Disheveled  Eye Solicitor::  Fair  Speech:  Pressured  Volume:  Increased  Mood:  Anxious  Affect:   Labile  Thought Process:  Disorganized  Orientation:  NA  Thought Content:  Delusions  Suicidal Thoughts:  No  Homicidal Thoughts:  No  Memory:  Immediate;   Poor Recent;   Poor Remote;   Poor  Judgement:  Impaired  Insight:  Lacking  Psychomotor Activity:  Restlessness  Concentration:  Poor  Recall:  Poor  Akathisia:  No  Handed:  Right  AIMS (if indicated):     Assets:  Communication Skills Desire for Improvement Housing Physical Health  Sleep:  Number of Hours: 4.75   Current Medications: Current Facility-Administered Medications  Medication Dose Route Frequency Provider Last Rate Last Dose  . acetaminophen (TYLENOL) tablet 650 mg  650 mg Oral Q6H PRN Kerry Hough, PA-C      . alum & mag hydroxide-simeth (MAALOX/MYLANTA) 200-200-20 MG/5ML suspension 30 mL  30 mL Oral Q4H PRN Kerry Hough, PA-C   30 mL at 12/29/12 0132  . hydrOXYzine (ATARAX/VISTARIL) tablet 25 mg  25 mg Oral Q6H PRN Sanjuana Kava, NP   25 mg at 01/01/13 1644  . lisinopril (PRINIVIL,ZESTRIL) tablet 10 mg  10 mg Oral Daily Kerry Hough, PA-C   10 mg at 01/03/13 4098  . magnesium hydroxide (MILK OF MAGNESIA) suspension 30 mL  30 mL Oral Daily PRN Kerry Hough, PA-C      . polyvinyl alcohol (LIQUIFILM TEARS) 1.4 % ophthalmic solution 1 drop  1 drop Both Eyes PRN Thermon Leyland, NP   1 drop at 01/03/13 1008  . risperiDONE (RISPERDAL) tablet 2 mg  2 mg Oral BID Verne Spurr, PA-C   2 mg at 01/03/13 1006  . simvastatin (ZOCOR) tablet 5 mg  5 mg Oral q1800 Kerry Hough, PA-C  5 mg at 01/02/13 1726  . traZODone (DESYREL) tablet 100 mg  100 mg Oral QHS Verne Spurr, PA-C      . white petrolatum (VASELINE) gel   Topical Daily Mojeed Akintayo   1 application at 01/03/13 1610    Lab Results:  Results for orders placed during the hospital encounter of 12/25/12 (from the past 48 hour(s))  GLUCOSE, CAPILLARY     Status: None   Collection Time    01/01/13  5:02 PM      Result Value Range    Glucose-Capillary 98  70 - 99 mg/dL  GLUCOSE, CAPILLARY     Status: None   Collection Time    01/02/13  6:39 AM      Result Value Range   Glucose-Capillary 76  70 - 99 mg/dL  GLUCOSE, CAPILLARY     Status: None   Collection Time    01/02/13 12:00 PM      Result Value Range   Glucose-Capillary 70  70 - 99 mg/dL  GLUCOSE, CAPILLARY     Status: None   Collection Time    01/02/13  5:05 PM      Result Value Range   Glucose-Capillary 94  70 - 99 mg/dL  GLUCOSE, CAPILLARY     Status: None   Collection Time    01/03/13  6:26 AM      Result Value Range   Glucose-Capillary 79  70 - 99 mg/dL  GLUCOSE, CAPILLARY     Status: Abnormal   Collection Time    01/03/13 11:34 AM      Result Value Range   Glucose-Capillary 100 (*) 70 - 99 mg/dL    Physical Findings: AIMS: Facial and Oral Movements Muscles of Facial Expression: None, normal Lips and Perioral Area: None, normal Jaw: None, normal Tongue: None, normal,Extremity Movements Upper (arms, wrists, hands, fingers): None, normal Lower (legs, knees, ankles, toes): None, normal, Trunk Movements Neck, shoulders, hips: None, normal, Overall Severity Severity of abnormal movements (highest score from questions above): None, normal Incapacitation due to abnormal movements: None, normal Patient's awareness of abnormal movements (rate only patient's report): No Awareness, Dental Status Current problems with teeth and/or dentures?: No Does patient usually wear dentures?: No  CIWA:  CIWA-Ar Total: 1 COWS:     Treatment Plan Summary: Daily contact with patient to assess and evaluate symptoms and progress in treatment Medication management  Plan: 1. Continue crisis management and stabilization. 2. Medication management to reduce current symptoms to base line and improve patient's overall level of functioning 3. Treat health problems as indicated. 4. Develop treatment plan to decrease risk of relapse upon discharge and the need for  readmission. 5. Psycho-social education regarding relapse prevention and self care. 6. Health care follow up as needed for medical problems. 7. Continue home medications where appropriate. 8. After discussion with Dr. Jannifer Franklin, will change risperdol to 2mg  po BID. 9. Will increase Trazodone to 100mg  for sleep. 10. Due to his increasing confusion, wandering, and general decrease in cognition this patient is worrisome for dementia and/or delirium vs worsening mania. Will contact IM for review of this patient's condition.  Dr. Blake Divine is notified and will evaluate and advise. Medical Decision Making Problem Points:  Established problem, worsening (2) and Review of last therapy session (1) Data Points:  Order Aims Assessment (2) Review or order medicine tests (1)  I certify that inpatient services furnished can reasonably be expected to improve the patient's condition.  [name]. Anise Harbin RPAC 1:32 PM 01/03/2013

## 2013-01-03 NOTE — [facility] Group Notes (Signed)
[facility] LCSW Aftercare Discharge Planning Group Note   01/03/2013 11:13 AM  Participation Quality:  Engaged  Mood/Affect:  Appropriate  Depression Rating:  unknown  Anxiety Rating:  unknown  Thoughts of Suicide:  No Will you contract for safety?   NA  Current AVH:  Yes, but patient denies  Plan for Discharge/Comments:  "I'm getting married to my ex-wife today.  Will you come to the wedding?"  When told he was confused about the day and good Friday is not until tomorrow, he responded "That's OK.  In a few ticks of the clock it will be Friday."  Transportation Means:  family  Supports: family  [name], Albert Hess

## 2013-01-03 NOTE — Tx Team (Signed)
  Interdisciplinary Treatment Plan Update   Date Reviewed:  01/03/2013  Time Reviewed:  3:11 PM  Progress in Treatment:   Attending groups: Yes Participating in groups: Less than previously Taking medication as prescribed: Yes  Tolerating medication: Yes Family/Significant other contact made: Yes  Patient understands diagnosis: No  Demonstrates minimal insight Discussing patient identified problems/goals with staff: Yes  See initial plan Medical problems stabilized or resolved: Yes Denies suicidal/homicidal ideation: Yes  In tx team   Patient has not harmed self or others: Yes  For review of initial/current patient goals, please see plan of care.  Estimated Length of Stay:  4-5 days  Reason for Continuation of Hospitalization: Delusions, Medication stabilization, R/O Dementia  New Problems/Goals identified:  N/A  Discharge Plan or Barriers:   return home, follow up outpt  Additional Comments:  Albert Hess continues to be delusional with grandiose thoughts.  He is also becoming increasingly confused and labile.  Internal medicine consult for help.   Attendees:  Signature: Thedore Mins, MD 01/03/2013 3:11 PM   Signature: Richelle Ito, LCSW 01/03/2013 3:11 PM  Signature: Verne Spurr, PA 01/03/2013 3:11 PM  Signature: Joslyn Devon, RN 01/03/2013 3:11 PM  Signature:  01/03/2013 3:11 PM  Signature:  01/03/2013 3:11 PM  Signature:   01/03/2013 3:11 PM  Signature:    Signature:    Signature:    Signature:    Signature:    Signature:      Scribe for Treatment Team:   Richelle Ito, LCSW  01/03/2013 3:11 PM

## 2013-01-03 NOTE — Progress Notes (Signed)
Patient continues to be labile; irritable at time.  He remains delusional and disorganized.  He attempted to go into another patient's room and when he was told no, he became agitated and loud.  At times, he seems to be confused.  He kept repeating that his clothes were in the other patient's room and had to get them.  He has wondered into this patient's room previously.  Patient has to be closely monitored due to his behavior.  He denies any SI/HI/AVH at this time.  Continue to monitor medication management and MD orders. Safety checks continued every 15 minutes per protocol.  Redirect patient as necessary.  Patient is redirectable at this time.

## 2013-01-03 NOTE — [facility] Group Notes (Cosign Needed)
[facility] LCSW Group Therapy  01/03/2013 3:39 PM    Type of Therapy:  Group Therapy  Participation Level:  None  Participation Quality:  Inattentive  Affect:  Lethargic  Cognitive:  Disorganized  Insight:  Lacking  Engagement in Group:  Lacking  Engagement in Therapy:  Lacking  Modes of Intervention:  Discussion, Education, Exploration, Role-play, Socialization and Support  Summary of Progress/Problems: Topic-Balance: The topic for group was balance in life. Albert Hess sat quietly throughout majority of group and drew pictures. He did not appear to be listening to the group conversation and did not contribute when asked. Pt fell asleep briefly, woke up, and left group.   Albert Hess, [name] 01/03/2013, 3:39 PM

## 2013-01-04 LAB — GLUCOSE, CAPILLARY
Glucose-Capillary: 90 mg/dL (ref 70–99)
Glucose-Capillary: 93 mg/dL (ref 70–99)

## 2013-01-04 NOTE — Progress Notes (Signed)
Adult Psychoeducational Group Note  Date:  01/04/2013 Time:  8:00PM Group Topic/Focus:  Wrap-Up Group:   The focus of this group is to help patients review their daily goal of treatment and discuss progress on daily workbooks.  Participation Level:  Did Not Attend   Additional Comments:  Pt. Didn't attend group.   Bing Plume D 01/04/2013, 11:19 PM

## 2013-01-04 NOTE — Progress Notes (Signed)
Patient ID: Albert Hess, male   DOB: [birth year], 67 y.o.   MRN: [id number] [facility] MD Progress Note  01/04/2013 9:58 AM Albert Hess  MRN:  [id number] Subjective:  "I am doing much better?" Objective: Patient appears to be less agitated today but  remains delusional with disorganized thinking. He states that he slept very well last night but staffs reports that he only slept for 2 hours. He is compliant with his medication and did not verbalize any adverse reactions.   Diagnosis:  Bipolar I disorder, most recent episode (or current) manic   ADL's:  Impaired  Sleep: poor  Appetite:  Good  Suicidal Ideation:  denies Homicidal Ideation:  denies AEB (as evidenced by):  Psychiatric Specialty Exam: Review of Systems  Constitutional: Negative.  Negative for fever, chills, weight loss, malaise/fatigue and diaphoresis.  HENT: Negative for congestion and sore throat.   Eyes: Negative for blurred vision, double vision and photophobia.  Respiratory: Negative for cough, shortness of breath and wheezing.   Cardiovascular: Negative for chest pain, palpitations and PND.  Gastrointestinal: Negative for heartburn, nausea, vomiting, abdominal pain, diarrhea and constipation.  Musculoskeletal: Negative for myalgias, joint pain and falls.  Neurological: Negative for dizziness, tingling, tremors, sensory change, speech change, focal weakness, seizures, loss of consciousness, weakness and headaches.  Endo/Heme/Allergies: Negative for polydipsia. Does not bruise/bleed easily.  Psychiatric/Behavioral: Negative for depression, suicidal ideas, hallucinations, memory loss and substance abuse. The patient is not nervous/anxious and does not have insomnia.     Blood pressure 93/61, pulse 107, temperature 98.3 F (36.8 C), temperature source Oral, resp. rate 18, height 5' 10.25" (1.784 m), weight 97.523 kg (215 lb).Body mass index is 30.64 kg/(m^2).  General Appearance: Fairly Groomed  Patent attorney::  Fair  Speech:   Pressured  Volume:  Normal  Mood:  Less irritable  Affect:  Full range  Thought Process:  Circumstantial  Orientation:  Full (Time, Place, and Person)  Thought Content:  Delusions  Suicidal Thoughts:  No  Homicidal Thoughts:  No  Memory:  Immediate;   Fair  Judgement:  Impaired  Insight:  Present  Psychomotor Activity:  Restlessness  Concentration:  Poor  Recall:  Fair  Akathisia:  No  Handed:  Right  AIMS (if indicated):     Assets:  Social Support  Sleep:  Number of Hours: 2   Current Medications: Current Facility-Administered Medications  Medication Dose Route Frequency Provider Last Rate Last Dose  . acetaminophen (TYLENOL) tablet 650 mg  650 mg Oral Q6H PRN Kerry Hough, PA-C      . alum & mag hydroxide-simeth (MAALOX/MYLANTA) 200-200-20 MG/5ML suspension 30 mL  30 mL Oral Q4H PRN Kerry Hough, PA-C   30 mL at 12/29/12 0132  . hydrOXYzine (ATARAX/VISTARIL) tablet 25 mg  25 mg Oral Q6H PRN Sanjuana Kava, NP   25 mg at 01/01/13 1644  . lisinopril (PRINIVIL,ZESTRIL) tablet 10 mg  10 mg Oral Daily Kerry Hough, PA-C   10 mg at 01/04/13 0865  . magnesium hydroxide (MILK OF MAGNESIA) suspension 30 mL  30 mL Oral Daily PRN Kerry Hough, PA-C      . polyvinyl alcohol (LIQUIFILM TEARS) 1.4 % ophthalmic solution 1 drop  1 drop Both Eyes PRN Thermon Leyland, NP   1 drop at 01/03/13 2205  . risperiDONE (RISPERDAL) tablet 2 mg  2 mg Oral BID Verne Spurr, PA-C   2 mg at 01/04/13 7846  . simvastatin (ZOCOR) tablet 5 mg  5  mg Oral q1800 Kerry Hough, PA-C   5 mg at 01/03/13 1700  . traZODone (DESYREL) tablet 100 mg  100 mg Oral QHS Verne Spurr, PA-C   100 mg at 01/03/13 2204  . white petrolatum (VASELINE) gel   Topical Daily Elizabethanne Lusher   1 application at 01/03/13 4098    Lab Results:  Results for orders placed during the hospital encounter of 12/25/12 (from the past 48 hour(s))  GLUCOSE, CAPILLARY     Status: None   Collection Time    01/02/13 12:00 PM      Result  Value Range   Glucose-Capillary 70  70 - 99 mg/dL  GLUCOSE, CAPILLARY     Status: None   Collection Time    01/02/13  5:05 PM      Result Value Range   Glucose-Capillary 94  70 - 99 mg/dL  GLUCOSE, CAPILLARY     Status: None   Collection Time    01/03/13  6:26 AM      Result Value Range   Glucose-Capillary 79  70 - 99 mg/dL  GLUCOSE, CAPILLARY     Status: Abnormal   Collection Time    01/03/13 11:34 AM      Result Value Range   Glucose-Capillary 100 (*) 70 - 99 mg/dL  GLUCOSE, CAPILLARY     Status: Abnormal   Collection Time    01/03/13  5:17 PM      Result Value Range   Glucose-Capillary 120 (*) 70 - 99 mg/dL  CBC     Status: Abnormal   Collection Time    01/03/13  8:06 PM      Result Value Range   WBC 3.8 (*) 4.0 - 10.5 K/uL   RBC 3.33 (*) 4.22 - 5.81 MIL/uL   Hemoglobin 11.2 (*) 13.0 - 17.0 g/dL   HCT 11.9 (*) 14.7 - 82.9 %   MCV 96.7  78.0 - 100.0 fL   MCH 33.6  26.0 - 34.0 pg   MCHC 34.8  30.0 - 36.0 g/dL   RDW 56.2  13.0 - 86.5 %   Platelets 157  150 - 400 K/uL  COMPREHENSIVE METABOLIC PANEL     Status: Abnormal   Collection Time    01/03/13  8:06 PM      Result Value Range   Sodium 138  135 - 145 mEq/L   Potassium 5.0  3.5 - 5.1 mEq/L   Chloride 106  96 - 112 mEq/L   CO2 24  19 - 32 mEq/L   Glucose, Bld 99  70 - 99 mg/dL   BUN 28 (*) 6 - 23 mg/dL   Creatinine, Ser 7.84  0.50 - 1.35 mg/dL   Calcium 9.5  8.4 - 69.6 mg/dL   Total Protein 6.7  6.0 - 8.3 g/dL   Albumin 3.7  3.5 - 5.2 g/dL   AST 17  0 - 37 U/L   ALT 13  0 - 53 U/L   Alkaline Phosphatase 60  39 - 117 U/L   Total Bilirubin 0.3  0.3 - 1.2 mg/dL   GFR calc non Af Amer 84 (*) >90 mL/min   GFR calc Af Amer >90  >90 mL/min   Comment:            The eGFR has been calculated     using the CKD EPI equation.     This calculation has not been     validated in all clinical     situations.  eGFR's persistently     <90 mL/min signify     possible Chronic Kidney Disease.  MAGNESIUM     Status: None    Collection Time    01/03/13  8:06 PM      Result Value Range   Magnesium 2.1  1.5 - 2.5 mg/dL  PHOSPHORUS     Status: None   Collection Time    01/03/13  8:06 PM      Result Value Range   Phosphorus 4.2  2.3 - 4.6 mg/dL  GLUCOSE, CAPILLARY     Status: None   Collection Time    01/04/13  6:18 AM      Result Value Range   Glucose-Capillary 88  70 - 99 mg/dL    Physical Findings: AIMS: Facial and Oral Movements Muscles of Facial Expression: None, normal Lips and Perioral Area: None, normal Jaw: None, normal Tongue: None, normal,Extremity Movements Upper (arms, wrists, hands, fingers): None, normal Lower (legs, knees, ankles, toes): None, normal, Trunk Movements Neck, shoulders, hips: None, normal, Overall Severity Severity of abnormal movements (highest score from questions above): None, normal Incapacitation due to abnormal movements: None, normal Patient's awareness of abnormal movements (rate only patient's report): No Awareness, Dental Status Current problems with teeth and/or dentures?: No Does patient usually wear dentures?: No  CIWA:  CIWA-Ar Total: 1 COWS:     Treatment Plan Summary: Daily contact with patient to assess and evaluate symptoms and progress in treatment Medication management  Plan: 1. Admit for crisis management and stabilization. 2. Medication management to reduce current symptoms to base line and improve the patient's overall level of functioning. 3. Treat health problems as indicated. 4. Develop treatment plan to decrease risk of relapse upon discharge and to reduce the need for readmission. 5. Psycho-social education regarding relapse prevention and self care. 6. Health care follow up as needed for medical problems. 7. Restart home medications where appropriate. 8. Continue current medication as written. 9. ELOS: 3-5 days  10. Continue Risperdal 1mg  po qam and 2mg  at bedtime. 11. Increase Trazodone to 100mg  po qhs for Insomnia. Medical  Decision Making Problem Points:  Established problem, stable/improving (1) Data Points:  Review of medication regiment & side effects (2)  I certify that inpatient services furnished can reasonably be expected to improve the patient's condition.  Thedore Mins, MD 9:58 AM 01/04/2013

## 2013-01-04 NOTE — Progress Notes (Signed)
Patient ID: Albert Hess, male   DOB: 08/25/1946, 67 y.o.   MRN: [id number] D. The patient is still disorganized and manic. He was unable to attend evening group. Instead he stood out in the hall reading signs on the wall. Somewhat irritable tonight. Stated he was upset because he has been in here 6 months and has enough and wants to go home. A. Encouraged the patient to attend evening group. Reoriented to time and place. HS medication administered. R. Restless and unable to attend group. Compliant with medication.

## 2013-01-04 NOTE — Progress Notes (Signed)
Pt was at the med window and kept twisting and turning his eyeglasses around. He then placed the medication cup in one portion of the frame of the glasses where the lens had been removed.pt is cooperative and pleasant . Pt did request vaseline for his feet stating he has very dry skin. Pt has been sleeping good and has been going to groups. He has no compliants at this time and denies any SI or HI. Contracts for safety.

## 2013-01-04 NOTE — Progress Notes (Signed)
Recreation Therapy Notes   Date: 04.18.2014 Time: 9:30am Location: [address] Day Room      Group Topic/Focus: Memory, Cognitive Training  Participation Level: Active  Participation Quality: Appropriate  Affect: Flat  Cognitive: Oriented   Additional Comments: Activity: Word Link & Memory Sequence Explanation: Word Link - Patients were asked to link words together (i.e. Red Tomato Soup). Patients were asked to link together as many words as possible. Memory Sequence - Patients were asked to create a sequence of actions for each member of the group to complete. The entire group had to complete the sequence before a new action as added on. Each patient got a turn to add to the sequence of actions. Patients participated from a seated position.   Patient actively participated in both activities. 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 Group was unable to process the memory sequence game so it was stopped after the 4th patient. Patient attempted to add actions to the sequence out of turn. Patient attempted to add a sequence with more than one action to the sequence being created. Patient needed assistance completing the four action sequence created. Patient actively participated in wrap up discussion about the importance of exercising our brains and forming a routine.   Patient stated in group that he does 1,000's of squats at a time. Patient stated that he does 1,000's of exercises all the time. Patient stated he could lift every person in the day room at once.   Marykay Lex Dennis Killilea, LRT/CTRS  Annica Marinello L 01/04/2013 11:47 AM

## 2013-01-04 NOTE — [facility] Group Notes (Signed)
[facility] LCSW Aftercare Discharge Planning Group Note   01/04/2013 12:09 PM  Participation Quality:  Engaged  Mood/Affect:  Appropriate  Depression Rating:    Anxiety Rating:    Thoughts of Suicide:  No Will you contract for safety?   NA  Current AVH:  Yes Delusional   "I heard you at [facility] last night."  Plan for Discharge/Comments:  Return home, follow up ACT  Transportation Means: family  Supports:family  Albert Hess, Albert Hess

## 2013-01-04 NOTE — Progress Notes (Signed)
Adult Psychoeducational Group Note  Date:  01/04/2013 Time:  2000  Group Topic/Focus:  Karaoke night   Participation Level:  Active  Participation Quality:  Appropriate  Affect:  Appropriate  Cognitive:  Appropriate  Insight: Appropriate  Engagement in Group:  Engaged  Modes of Intervention:  Activity  Additional Comments:    Lya Holben A 01/04/2013, 1:23 AM

## 2013-01-04 NOTE — Clinical Social Work Note (Signed)
[facility] LCSW Group Therapy  01/04/2013 2:28 PM   Type of Therapy:  Group Therapy  Participation Level:  Active  Participation Quality:  Attentive  Affect:  Appropriate  Cognitive:  Appropriate  Insight:  Improving  Engagement in Therapy:  Engaged  Modes of Intervention:  Clarification, Education, Exploration and Socialization  Summary of Progress/Problems: Today's group focused on relapse prevention.  We defined the term, and then brainstormed on ways to prevent relapse.  Albert Hess identified protective factors of [name], and painting.  "I've painted all the pictures in this hospital."  When I couldn't find his signature, he stated that it was hidden, and initials only.  "You have to know where to look."  [name] 01/04/2013 , 2:28 PM

## 2013-01-05 DIAGNOSIS — F3110 Bipolar disorder, current episode manic without psychotic features, unspecified: Secondary | ICD-10-CM

## 2013-01-05 DIAGNOSIS — L97509 Non-pressure chronic ulcer of other part of unspecified foot with unspecified severity: Secondary | ICD-10-CM

## 2013-01-05 LAB — GLUCOSE, CAPILLARY: Glucose-Capillary: 85 mg/dL (ref 70–99)

## 2013-01-05 LAB — VITAMIN B12: Vitamin B-12: 377 pg/mL (ref 211–911)

## 2013-01-05 MED ORDER — VITAMIN B-12 100 MCG PO TABS
100.0000 ug | ORAL_TABLET | Freq: Every day | ORAL | Status: DC
  Administered 2013-01-05 – 2013-01-21 (×17): 100 ug via ORAL
  Filled 2013-01-05 (×19): qty 1

## 2013-01-05 NOTE — [facility] Group Notes (Signed)
[facility] LCSW Group Therapy  01/05/2013 11:15-11:55AM   Summary of Progress/Problems: The main focus of today's process group was for the patient to identify ways in which they have in the past sabotaged their own recovery and reasons they may have done this. We then worked to identify a specific plan to avoid this in the future when discharged from the hospital. There was a lively discussion about how to stay on medications using a pillbox or other reminders, as well as how to engage one's doctor as an important part of one's wellness plan. The patient expressed the delusion that the people in the group room are on his doctor's surgery team.  He talked a few moments about this, and then started talking about how if you believe in God you are equal to God, and therefore you are God.    Type of Therapy: Group Therapy  Participation Level:  Active  Participation Quality:  Attentive, Sharing  Affect:  Blunted  Cognitive:  Delusional  Insight:  Off Topic  Engagement in Therapy:  Off Topic  Modes of Intervention:  Education and Exploration   Sarina Ser 01/05/2013, 5:04 PM

## 2013-01-05 NOTE — [facility] Group Notes (Signed)
[facility] Group Notes:  (Nursing/MHT/Case Management/Adjunct)  Date:  01/05/2013  Time:  0930  Type of Therapy:  Psychoeducational Skills  Participation Level:  Minimal  Participation Quality:  Inattentive  Affect:  Blunted and Not Congruent  Cognitive:  Lacking  Insight:  Limited  Engagement in Group:  Distracting, Lacking, Limited and Off Topic  Modes of Intervention:  Activity, Clarification, Discussion, Education, Problem-solving and Support  Summary of Progress/Problems: Left early  Arturo Morton 01/05/2013, 10:39 AM

## 2013-01-05 NOTE — Progress Notes (Addendum)
[facility] PROGRESS NOTE  Albert Hess AOZ:[id number] DOB: Dec 15, 1945 DOA: 12/25/2012 PCP: Albert Burner, PA-C  Assessment/Plan: 1. Delusions/ altered mental status; improved when compared to 2 days ago. He recognized me, . He is not repetitive , his words are not repetitive. He is more relaxed. His blood work did not reveal any metabolic abnormalities that would contribute to his delusional state of mind. His TSH from 2 weeks ago was within normal limits. His b12 level is normal. He does not appear to have any focal deficits. These episodes of delusions and confusion could probably be maniac episodes from his bipolar, vs early dementia. If he has episodes of increased confusion, we can get an MRI of the brain with out contrast to evaluate for stroke , but suspicion is low.  2. Hyponatremia resolved.  3. Insomnia; appears to have resolved. He reports his sleep medication works great and he is getting a good night sleep.  Diabetes Mellitus:  CBG (last 3)   Recent Labs  01/05/13 1710 01/06/13 0621 01/06/13 1211  GLUCAP 110* 122* 94  hgba1c IS 5.7.        HPI/Subjective: Feel great, never felt this good for so many days.   Objective: Filed Vitals:   01/04/13 0731 01/04/13 0732 01/05/13 0755 01/05/13 0756  BP: 109/66 93/61 121/77 97/66  Pulse: 97 107 101 108  Temp: 98.3 F (36.8 C)  97.4 F (36.3 C)   TempSrc: Oral Oral Oral   Resp: 18  20   Height:      Weight:       No intake or output data in the 24 hours ending 01/05/13 0912 Filed Weights   12/25/12 2116  Weight: 97.523 kg (215 lb)    Exam:   General:  Alert afebrile comfortable  Cardiovascular: s1s2 RRR  Respiratory: ctab  Abdomen: soft NT ND BS+  Musculoskeletal: pedal edema present.   Neuro: no focal deficits. Oriented to place and person and not to time.   Data Reviewed: Basic Metabolic Panel:  Recent Labs Lab 12/31/12 0620 01/03/13 2006  NA 139 138  K 4.9 5.0  CL 105 106  CO2  25 24  GLUCOSE 104* 99  BUN 28* 28*  CREATININE 0.95 0.99  CALCIUM 9.3 9.5  MG  --  2.1  PHOS  --  4.2   Liver Function Tests:  Recent Labs Lab 01/03/13 2006  AST 17  ALT 13  ALKPHOS 60  BILITOT 0.3  PROT 6.7  ALBUMIN 3.7   No results found for this basename: LIPASE, AMYLASE,  in the last 168 hours No results found for this basename: AMMONIA,  in the last 168 hours CBC:  Recent Labs Lab 01/03/13 2006  WBC 3.8*  HGB 11.2*  HCT 32.2*  MCV 96.7  PLT 157   Cardiac Enzymes: No results found for this basename: CKTOTAL, CKMB, CKMBINDEX, TROPONINI,  in the last 168 hours BNP (last 3 results) No results found for this basename: PROBNP,  in the last 8760 hours CBG:  Recent Labs Lab 01/04/13 0618 01/04/13 1140 01/04/13 1707 01/04/13 2114 01/05/13 0609  GLUCAP 88 112* 90 93 85    No results found for this or any previous visit (from the past 240 hour(s)).   Studies: No results found.  Scheduled Meds: . lisinopril  10 mg Oral Daily  . risperiDONE  2 mg Oral BID  . simvastatin  5 mg Oral q1800  . traZODone  100 mg Oral QHS  . white petrolatum  Topical Daily   Continuous Infusions:   Principal Problem:   Bipolar I disorder, most recent episode (or current) manic Active Problems:   Hyponatremia   Diabetes   Psychogenic polydipsia   Ulcer of foot       Albert Hess  [facility] Pager [phone] If 7PM-7AM, please contact night-coverage at [email or web address], password [facility] 01/05/2013, 9:12 AM  LOS: 11 days

## 2013-01-05 NOTE — Progress Notes (Signed)
D: Visible in milieu but minimally interactive with peers. Appears flat and is somewhat irritable and manic. No acute distress noted. Denies pain/discomfort. Refused AM meds stating he felt fine and would let writer know if he changed his mind. Denies SI/HI/AVH and contracts for safety.  A: Safety has been maintained with q15 minute observation. Support and encouragement provided. Encouraged to take meds as ordered by MD. Encouraged participation.   R: Remains safe. He is manic and irritable. He is non-compliant with meds and treatment goals. Offers no questions or concerns. Will continue current POC, encourage med compliance and q15 min obs.

## 2013-01-05 NOTE — Progress Notes (Signed)
Patient ID: Albert Hess, male   DOB: 1946/07/09, 67 y.o.   MRN: [id number] [facility] MD Progress Note  01/05/2013 11:43 AM Albert Hess  MRN:  [id number]   Subjective:     Patient continues to be delusional with disorganized thinking. At times hyper verbal.  Diagnosis:  Bipolar I disorder, most recent episode (or current) manic   ADL's:  Impaired  Sleep: poor  Appetite:  Good  Suicidal Ideation:  denies Homicidal Ideation:  denies AEB (as evidenced by):  Psychiatric Specialty Exam: Review of Systems  Constitutional: Negative.  Negative for fever, chills, weight loss, malaise/fatigue and diaphoresis.  HENT: Negative for congestion and sore throat.   Eyes: Negative for blurred vision, double vision and photophobia.  Respiratory: Negative for cough, shortness of breath and wheezing.   Cardiovascular: Negative for chest pain, palpitations and PND.  Gastrointestinal: Negative for heartburn, nausea, vomiting, abdominal pain, diarrhea and constipation.  Musculoskeletal: Negative for myalgias, joint pain and falls.  Neurological: Negative for dizziness, tingling, tremors, sensory change, speech change, focal weakness, seizures, loss of consciousness, weakness and headaches.  Endo/Heme/Allergies: Negative for polydipsia. Does not bruise/bleed easily.  Psychiatric/Behavioral: Negative for depression, suicidal ideas, hallucinations, memory loss and substance abuse. The patient is not nervous/anxious and does not have insomnia.     Blood pressure 97/66, pulse 108, temperature 97.4 F (36.3 C), temperature source Oral, resp. rate 20, height 5' 10.25" (1.784 m), weight 97.523 kg (215 lb).Body mass index is 30.64 kg/(m^2).  General Appearance: Fairly Groomed  Patent attorney::  Fair  Speech:  Pressured  Volume:  Normal  Mood:  Less irritable  Affect:  Full range  Thought Process:  Circumstantial  Orientation:  Full (Time, Place, and Person)  Thought Content:  Delusions  Suicidal Thoughts:  No   Homicidal Thoughts:  No  Memory:  Immediate;   Fair  Judgement:  Impaired  Insight:  poor  Psychomotor Activity:  Restlessness  Concentration:  Poor  Recall:  Fair  Akathisia:  No  Handed:  Right  AIMS (if indicated):     Assets:  Social Support  Sleep:  Number of Hours: 4.75   Current Medications: Current Facility-Administered Medications  Medication Dose Route Frequency Provider Last Rate Last Dose  . acetaminophen (TYLENOL) tablet 650 mg  650 mg Oral Q6H PRN Kerry Hough, PA-C      . alum & mag hydroxide-simeth (MAALOX/MYLANTA) 200-200-20 MG/5ML suspension 30 mL  30 mL Oral Q4H PRN Kerry Hough, PA-C   30 mL at 12/29/12 0132  . hydrOXYzine (ATARAX/VISTARIL) tablet 25 mg  25 mg Oral Q6H PRN Sanjuana Kava, NP   25 mg at 01/01/13 1644  . lisinopril (PRINIVIL,ZESTRIL) tablet 10 mg  10 mg Oral Daily Kerry Hough, PA-C   10 mg at 01/04/13 7829  . magnesium hydroxide (MILK OF MAGNESIA) suspension 30 mL  30 mL Oral Daily PRN Kerry Hough, PA-C      . polyvinyl alcohol (LIQUIFILM TEARS) 1.4 % ophthalmic solution 1 drop  1 drop Both Eyes PRN Thermon Leyland, NP   1 drop at 01/04/13 1715  . risperiDONE (RISPERDAL) tablet 2 mg  2 mg Oral BID Verne Spurr, PA-C   2 mg at 01/04/13 1714  . simvastatin (ZOCOR) tablet 5 mg  5 mg Oral q1800 Kerry Hough, PA-C   5 mg at 01/04/13 1714  . traZODone (DESYREL) tablet 100 mg  100 mg Oral QHS Verne Spurr, PA-C   100 mg at 01/04/13 2118  .  white petrolatum (VASELINE) gel   Topical Daily Mojeed Akintayo        Lab Results:  Results for orders placed during the hospital encounter of 12/25/12 (from the past 48 hour(s))  GLUCOSE, CAPILLARY     Status: Abnormal   Collection Time    01/03/13  5:17 PM      Result Value Range   Glucose-Capillary 120 (*) 70 - 99 mg/dL  CBC     Status: Abnormal   Collection Time    01/03/13  8:06 PM      Result Value Range   WBC 3.8 (*) 4.0 - 10.5 K/uL   RBC 3.33 (*) 4.22 - 5.81 MIL/uL   Hemoglobin 11.2 (*)  13.0 - 17.0 g/dL   HCT 40.9 (*) 81.1 - 91.4 %   MCV 96.7  78.0 - 100.0 fL   MCH 33.6  26.0 - 34.0 pg   MCHC 34.8  30.0 - 36.0 g/dL   RDW 78.2  95.6 - 21.3 %   Platelets 157  150 - 400 K/uL  COMPREHENSIVE METABOLIC PANEL     Status: Abnormal   Collection Time    01/03/13  8:06 PM      Result Value Range   Sodium 138  135 - 145 mEq/L   Potassium 5.0  3.5 - 5.1 mEq/L   Chloride 106  96 - 112 mEq/L   CO2 24  19 - 32 mEq/L   Glucose, Bld 99  70 - 99 mg/dL   BUN 28 (*) 6 - 23 mg/dL   Creatinine, Ser 0.86  0.50 - 1.35 mg/dL   Calcium 9.5  8.4 - 57.8 mg/dL   Total Protein 6.7  6.0 - 8.3 g/dL   Albumin 3.7  3.5 - 5.2 g/dL   AST 17  0 - 37 U/L   ALT 13  0 - 53 U/L   Alkaline Phosphatase 60  39 - 117 U/L   Total Bilirubin 0.3  0.3 - 1.2 mg/dL   GFR calc non Af Amer 84 (*) >90 mL/min   GFR calc Af Amer >90  >90 mL/min   Comment:            The eGFR has been calculated     using the CKD EPI equation.     This calculation has not been     validated in all clinical     situations.     eGFR's persistently     <90 mL/min signify     possible Chronic Kidney Disease.  MAGNESIUM     Status: None   Collection Time    01/03/13  8:06 PM      Result Value Range   Magnesium 2.1  1.5 - 2.5 mg/dL  PHOSPHORUS     Status: None   Collection Time    01/03/13  8:06 PM      Result Value Range   Phosphorus 4.2  2.3 - 4.6 mg/dL  GLUCOSE, CAPILLARY     Status: None   Collection Time    01/04/13  6:18 AM      Result Value Range   Glucose-Capillary 88  70 - 99 mg/dL  GLUCOSE, CAPILLARY     Status: Abnormal   Collection Time    01/04/13 11:40 AM      Result Value Range   Glucose-Capillary 112 (*) 70 - 99 mg/dL  GLUCOSE, CAPILLARY     Status: None   Collection Time    01/04/13  5:07 PM  Result Value Range   Glucose-Capillary 90  70 - 99 mg/dL  VITAMIN Q65     Status: None   Collection Time    01/04/13  7:33 PM      Result Value Range   Vitamin B-12 377  211 - 911 pg/mL  GLUCOSE,  CAPILLARY     Status: None   Collection Time    01/04/13  9:14 PM      Result Value Range   Glucose-Capillary 93  70 - 99 mg/dL  GLUCOSE, CAPILLARY     Status: None   Collection Time    01/05/13  6:09 AM      Result Value Range   Glucose-Capillary 85  70 - 99 mg/dL    Physical Findings: AIMS: Facial and Oral Movements Muscles of Facial Expression: None, normal Lips and Perioral Area: None, normal Jaw: None, normal Tongue: None, normal,Extremity Movements Upper (arms, wrists, hands, fingers): None, normal Lower (legs, knees, ankles, toes): None, normal, Trunk Movements Neck, shoulders, hips: None, normal, Overall Severity Severity of abnormal movements (highest score from questions above): None, normal Incapacitation due to abnormal movements: None, normal Patient's awareness of abnormal movements (rate only patient's report): No Awareness, Dental Status Current problems with teeth and/or dentures?: No Does patient usually wear dentures?: No  CIWA:  CIWA-Ar Total: 1 COWS:     Treatment Plan Summary: Daily contact with patient to assess and evaluate symptoms and progress in treatment Medication management  Plan:   Continue current meds  Medical Decision Making Problem Points:  Established problem, stable/improving (1) Data Points:  Review of medication regiment & side effects (2)  I certify that inpatient services furnished can reasonably be expected to improve the patient's condition.   11:43 AM 01/05/2013

## 2013-01-06 LAB — GLUCOSE, CAPILLARY
Glucose-Capillary: 94 mg/dL (ref 70–99)
Glucose-Capillary: 105 mg/dL — ABNORMAL HIGH (ref 70–99)

## 2013-01-06 NOTE — [facility] Group Notes (Signed)
[facility] Group Notes:  (Nursing/MHT/Case Management/Adjunct)  Date:  01/06/2013  Time:  8:48 PM  Type of Therapy:  Group Therapy  Participation Level:  Active  Participation Quality:  Appropriate  Affect:  Appropriate  Cognitive:  Appropriate  Insight:  Appropriate  Engagement in Group:  Engaged  Modes of Intervention:  Discussion and Education  Summary of Progress/Problems:  Pt's states that he tried to have a good Easter but no family came.  However he states that he did have a good time with the people here.

## 2013-01-06 NOTE — [facility] Group Notes (Signed)
[facility] LCSW Group Therapy  01/06/2013 11:15AM-12:00PM  Summary of Progress/Problems:  The main focus of today's process group was to listen to a variety of genres of music and to identify that different types of music evoke different responses.  The patient then was able to identify personally what was soothing for them, as well as energizing.  Handouts were used to record feelings evoked, as well as how patient can personally use this knowledge in sleep habits, with depression, and with other symptoms.  The patient expressed fluently what emotions resulted from the various types of music played.  He stayed alert and engaged throughout group, repeatedly saying all music is good.  Type of Therapy:  Music Therapy  Participation Level:  Active  Participation Quality:  Attentive  Affect:  Appropriate  Cognitive:  Disorganized  Insight:  Developing/Improving  Engagement in Therapy:  Developing/Improving  Modes of Intervention:  Activity  Sarina Ser 01/06/2013, 12:44 PM

## 2013-01-06 NOTE — Progress Notes (Signed)
Patient ID: Albert Hess, male   DOB: 05-07-1946, 67 y.o.   MRN: [id number] D)  Attended group, but decided to leave as his feet and legs are swollen and tender, wanted to go back to his room and get in bed so he could get them elevated.  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  He also owns [employer]. While he was lying down, I noticed an open lesion on the lateral, plantar surface of his rt foot.  Stated isn't new, also pulled up his sleeves and his arms had large areas of discoloration, which he attributes to [employer].  Stated he has been dealing with a lot and probably doesn't have much longer to live, but wants to find some area he can still be useful.  Resting quietly at this time. A)  Will continue to monitor for safety, support, continue POC R)  Safety maintained.

## 2013-01-06 NOTE — Progress Notes (Signed)
Patient ID: Albert Hess, male   DOB: 01/27/1946, 67 y.o.   MRN: [id number] [facility] MD Progress Note  01/06/2013 11:26 AM Albert Hess  MRN:  [id number]   Subjective:    Reports good sleep. Thinks he came here for stroke and goes to [facility] for stroke. Per he made all pics in this hospital. Patient continues to be delusional with disorganized thinking. At times hyper verbal.  Diagnosis:  Bipolar I disorder, most recent episode (or current) manic   ADL's:  Impaired  Sleep: fair  Appetite:  Good  Suicidal Ideation:  denies Homicidal Ideation:  denies AEB (as evidenced by):  Psychiatric Specialty Exam: Review of Systems  Constitutional: Negative.  Negative for fever, chills, weight loss, malaise/fatigue and diaphoresis.  HENT: Negative for congestion and sore throat.   Eyes: Negative for blurred vision, double vision and photophobia.  Respiratory: Negative for cough, shortness of breath and wheezing.   Cardiovascular: Negative for chest pain, palpitations and PND.  Gastrointestinal: Negative for heartburn, nausea, vomiting, abdominal pain, diarrhea and constipation.  Musculoskeletal: Negative for myalgias, joint pain and falls.  Neurological: Negative for dizziness, tingling, tremors, sensory change, speech change, focal weakness, seizures, loss of consciousness, weakness and headaches.  Endo/Heme/Allergies: Negative for polydipsia. Does not bruise/bleed easily.  Psychiatric/Behavioral: Negative for depression, suicidal ideas, hallucinations, memory loss and substance abuse. The patient is not nervous/anxious and does not have insomnia.     Blood pressure 124/79, pulse 99, temperature 98.2 F (36.8 C), temperature source Oral, resp. rate 20, height 5' 10.25" (1.784 m), weight 97.523 kg (215 lb).Body mass index is 30.64 kg/(m^2).  General Appearance: Fairly Groomed  Patent attorney::  Fair  Speech:  Pressured  Volume:  Normal  Mood:  Less irritable  Affect:  Full range  Thought  Process:  Circumstantial  Orientation:  Full (Time, Place, and Person)  Thought Content:  Delusions of making all pics here  Suicidal Thoughts:  No  Homicidal Thoughts:  No  Memory:  Immediate;   Fair  Judgement:  Impaired  Insight:  poor  Psychomotor Activity:  Restlessness  Concentration:  Poor  Recall:  Fair  Akathisia:  No  Handed:  Right  AIMS (if indicated):     Assets:  Social Support  Sleep:  Number of Hours: 2.5   Current Medications: Current Facility-Administered Medications  Medication Dose Route Frequency Provider Last Rate Last Dose  . acetaminophen (TYLENOL) tablet 650 mg  650 mg Oral Q6H PRN Kerry Hough, PA-C      . alum & mag hydroxide-simeth (MAALOX/MYLANTA) 200-200-20 MG/5ML suspension 30 mL  30 mL Oral Q4H PRN Kerry Hough, PA-C   30 mL at 12/29/12 0132  . hydrOXYzine (ATARAX/VISTARIL) tablet 25 mg  25 mg Oral Q6H PRN Sanjuana Kava, NP   25 mg at 01/01/13 1644  . lisinopril (PRINIVIL,ZESTRIL) tablet 10 mg  10 mg Oral Daily Kerry Hough, PA-C   10 mg at 01/06/13 0801  . magnesium hydroxide (MILK OF MAGNESIA) suspension 30 mL  30 mL Oral Daily PRN Kerry Hough, PA-C      . polyvinyl alcohol (LIQUIFILM TEARS) 1.4 % ophthalmic solution 1 drop  1 drop Both Eyes PRN Thermon Leyland, NP   1 drop at 01/05/13 2121  . risperiDONE (RISPERDAL) tablet 2 mg  2 mg Oral BID Verne Spurr, PA-C   2 mg at 01/06/13 0801  . simvastatin (ZOCOR) tablet 5 mg  5 mg Oral q1800 Kerry Hough, PA-C  5 mg at 01/05/13 1724  . traZODone (DESYREL) tablet 100 mg  100 mg Oral QHS Verne Spurr, PA-C   100 mg at 01/05/13 2120  . vitamin B-12 (CYANOCOBALAMIN) tablet 100 mcg  100 mcg Oral Daily Kathlen Mody, MD   100 mcg at 01/06/13 0801  . white petrolatum (VASELINE) gel   Topical Daily Mojeed Akintayo   1 application at 01/06/13 5784    Lab Results:  Results for orders placed during the hospital encounter of 12/25/12 (from the past 48 hour(s))  GLUCOSE, CAPILLARY     Status:  Abnormal   Collection Time    01/04/13 11:40 AM      Result Value Range   Glucose-Capillary 112 (*) 70 - 99 mg/dL  GLUCOSE, CAPILLARY     Status: None   Collection Time    01/04/13  5:07 PM      Result Value Range   Glucose-Capillary 90  70 - 99 mg/dL  VITAMIN O96     Status: None   Collection Time    01/04/13  7:33 PM      Result Value Range   Vitamin B-12 377  211 - 911 pg/mL  GLUCOSE, CAPILLARY     Status: None   Collection Time    01/04/13  9:14 PM      Result Value Range   Glucose-Capillary 93  70 - 99 mg/dL  GLUCOSE, CAPILLARY     Status: None   Collection Time    01/05/13  6:09 AM      Result Value Range   Glucose-Capillary 85  70 - 99 mg/dL  GLUCOSE, CAPILLARY     Status: None   Collection Time    01/05/13 12:03 PM      Result Value Range   Glucose-Capillary 77  70 - 99 mg/dL   Comment 1 Documented in Chart     Comment 2 Notify RN    GLUCOSE, CAPILLARY     Status: Abnormal   Collection Time    01/05/13  5:10 PM      Result Value Range   Glucose-Capillary 110 (*) 70 - 99 mg/dL   Comment 1 Documented in Chart     Comment 2 Notify RN    GLUCOSE, CAPILLARY     Status: Abnormal   Collection Time    01/06/13  6:21 AM      Result Value Range   Glucose-Capillary 122 (*) 70 - 99 mg/dL    Physical Findings: AIMS: Facial and Oral Movements Muscles of Facial Expression: None, normal Lips and Perioral Area: None, normal Jaw: None, normal Tongue: None, normal,Extremity Movements Upper (arms, wrists, hands, fingers): None, normal Lower (legs, knees, ankles, toes): None, normal, Trunk Movements Neck, shoulders, hips: None, normal, Overall Severity Severity of abnormal movements (highest score from questions above): None, normal Incapacitation due to abnormal movements: None, normal Patient's awareness of abnormal movements (rate only patient's report): No Awareness, Dental Status Current problems with teeth and/or dentures?: No Does patient usually wear dentures?:  No  CIWA:  CIWA-Ar Total: 1 COWS:     Treatment Plan Summary: Daily contact with patient to assess and evaluate symptoms and progress in treatment Medication management  Plan:   Continue current meds Will IM recs.  Medical Decision Making Problem Points:  Established problem, stable/improving (1) Data Points:  Review of medication regiment & side effects (2)  I certify that inpatient services furnished can reasonably be expected to improve the patient's condition.   11:26 AM 01/06/2013

## 2013-01-06 NOTE — Progress Notes (Signed)
Patient ID: Albert Hess, male   DOB: 08/27/1946, 67 y.o.   MRN: [id number] He has been up and talking on the phone often he said to family members.Albert Hess" That his daughter was coming to get him today". 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 He has been encourage to lay down and elevate his legs d/t lower leg and foot edemas. Nurse extender  Asher Muir was informed and saw his legs and feet.

## 2013-01-07 ENCOUNTER — Ambulatory Visit (HOSPITAL_COMMUNITY)
Admit: 2013-01-07 | Discharge: 2013-01-07 | Disposition: A | Discharge status: Home / Self Care | Payer: Medicare Other | Attending: Internal Medicine | Admitting: Internal Medicine

## 2013-01-07 ENCOUNTER — Inpatient Hospital Stay (HOSPITAL_COMMUNITY): Payer: No Typology Code available for payment source

## 2013-01-07 DIAGNOSIS — R4182 Altered mental status, unspecified: Secondary | ICD-10-CM | POA: Insufficient documentation

## 2013-01-07 DIAGNOSIS — E7800 Pure hypercholesterolemia, unspecified: Secondary | ICD-10-CM | POA: Insufficient documentation

## 2013-01-07 DIAGNOSIS — E119 Type 2 diabetes mellitus without complications: Secondary | ICD-10-CM | POA: Insufficient documentation

## 2013-01-07 DIAGNOSIS — F29 Unspecified psychosis not due to a substance or known physiological condition: Secondary | ICD-10-CM | POA: Insufficient documentation

## 2013-01-07 DIAGNOSIS — I10 Essential (primary) hypertension: Secondary | ICD-10-CM | POA: Insufficient documentation

## 2013-01-07 LAB — GLUCOSE, CAPILLARY

## 2013-01-07 MED ORDER — TRAZODONE HCL 100 MG PO TABS
200.0000 mg | ORAL_TABLET | Freq: Every day | ORAL | Status: DC
  Administered 2013-01-07 – 2013-01-20 (×13): 200 mg via ORAL
  Filled 2013-01-07 (×15): qty 2

## 2013-01-07 MED ORDER — RISPERIDONE 1 MG PO TABS
1.0000 mg | ORAL_TABLET | Freq: Every day | ORAL | Status: DC
  Administered 2013-01-07 – 2013-01-15 (×9): 1 mg via ORAL
  Filled 2013-01-07 (×11): qty 1

## 2013-01-07 MED ORDER — RISPERIDONE 3 MG PO TABS
3.0000 mg | ORAL_TABLET | Freq: Every day | ORAL | Status: DC
  Administered 2013-01-08 – 2013-01-13 (×6): 3 mg via ORAL
  Filled 2013-01-07 (×7): qty 1

## 2013-01-07 NOTE — Progress Notes (Signed)
Recreation Therapy Notes  Date: 04.21.2014  Time: 9:30am  Location: [address] Day Room   Group Topic/Focus: Self Expression   Participation Level:  Minimal   Participation Quality:  Appropriate   Affect:  Euthymic  Cognitive:  Appropriate   Additional Comments: Activity: Emotion Airplane; Explanation: Patients were given a piece of plain white paper and asked to make a paper airplane out of it. On the paper airplane patients were asked to list the following things on the outside flaps: something that you fear, something that angers you. Patients were asked to list the following things on the inside flaps: Something you want to accomplish, a goal for your hospital stay. Patients were then given time to decorate their paper airplane anyway they choose. Folk indie rock music was played in the background to enhance the therapeutic environment.   Patient attended group but did not participate in group activity. Patient briefly danced to the music that was played. Patient sat and riffled through his blue folder. Patient did take the paper for the paper airplane, patient folded the paper multiple times in an accordion fashion. As LRT walked around to check on each patients progress patient stated he would complete his "in a minute."   Prior to group starting patient stated he was going to marry his ex-wife this morning in [place], [place]. Patient stated that his ex-wife would have to change her name because her last name is "Mankowski my name is Goelz." Patient then stated that he was the nephew of the owner of [facility] [employer]. Patient stated he gets treated very well when he goes there and he can drive any car he wants on the lot.   Marykay Lex Carmin Alvidrez, LRT/CTRS  Jearl Klinefelter 01/07/2013 12:29 PM

## 2013-01-07 NOTE — Progress Notes (Signed)
Nursing 1;1 Note: Patient in hallway.  Patient ready to go to [facility] hospital for x-ray.  Patient encouraged to wear red socks and to restrict fluids.  Patient off the unit at this time.  [name] and denies AVH.

## 2013-01-07 NOTE — Progress Notes (Signed)
1:1 Note- Pt still on 1:1, having grandiose delusions stating that "I am on the city council for [place]."  Pt is on 1500cc (5 cups) fluid restriction.  Pt is cooperative at this time but is argumentative and tangential at times, pt was trying to dial a number on the phone but kept starting it with the number 0.  Pt is scheduled for an MRI at 9pm, MRI staff want him to be at the MRI department to check in at 8pm because pt will have an xray prior to the MRI.

## 2013-01-07 NOTE — Tx Team (Addendum)
  Interdisciplinary Treatment Plan Update   Date Reviewed:  01/07/2013  Time Reviewed:  3:02 PM  Progress in Treatment:   Attending groups: Yes Participating in groups:Yes Taking medication as prescribed: Yes, but refused yesterday AM  Tolerating medication: Yes Family/Significant other contact made: Yes  Patient understands diagnosis: No  Demonstrates minimal insight Discussing patient identified problems/goals with staff: Yes  See initial plan Medical problems stabilized or resolved: Yes Denies suicidal/homicidal ideation: Yes  In tx team   Patient has not harmed self or others: Yes  For review of initial/current patient goals, please see plan of care.  Estimated Length of Stay:  4-5 days  Reason for Continuation of Hospitalization: Delusions, Medication stabilization, R/O Dementia  New Problems/Goals identified:  N/A  Discharge Plan or Barriers:   unclear at this point.  Likely not able to care for self at home  Additional Comments:  Kade continues to be delusional with grandiose thoughts.  Today he was placed on 1:1 to monitor his liquid intake as he may be demonstrating symptoms of too much liquid.  Also, he is scheduled for an MRI per internal consult.  Continues to struggle with sleep.  2.5 hours observed.   Attendees:  Signature: Thedore Mins, MD 01/07/2013 3:02 PM   Signature: Richelle Ito, LCSW 01/07/2013 3:02 PM  Signature: Verne Spurr, PA 01/07/2013 3:02 PM  Signature: Joslyn Devon, RN 01/07/2013 3:02 PM  Signature:  01/07/2013 3:02 PM  Signature:  01/07/2013 3:02 PM  Signature:   01/07/2013 3:02 PM  Signature:    Signature:    Signature:    Signature:    Signature:    Signature:      Scribe for Treatment Team:   Richelle Ito, LCSW  01/07/2013 3:02 PM

## 2013-01-07 NOTE — Progress Notes (Addendum)
[facility] MD Progress Note  01/07/2013 11:40 AM Albert Hess  MRN:  [id number]  Subjective: "I'm doing great!" "I couldn't feel any better than I do now," and "I'm getting married this morning at [facility]." " Today is Monday, 05-08-13 the day switches over in military time at one click past 12. " "I've lost 21# since I got here." Objective: Albert Hess is still hyper verbal, delusional, and grandiose. He has reported excellent sleep, but 2.25 hours are documented.   Diagnosis:  Bipolar I disorder, most recent episode (or current) manic   ADL's:  Impaired  Sleep: poor  Appetite:  Good  Suicidal Ideation:  denies Homicidal Ideation:  denies AEB (as evidenced by):  Psychiatric Specialty Exam: Review of Systems  Constitutional: Negative.  Negative for fever, chills, weight loss, malaise/fatigue and diaphoresis.  HENT: Negative for congestion and sore throat.   Eyes: Negative for blurred vision, double vision and photophobia.  Respiratory: Negative for cough, shortness of breath and wheezing.   Cardiovascular: Negative for chest pain, palpitations and PND.  Gastrointestinal: Negative for heartburn, nausea, vomiting, abdominal pain, diarrhea and constipation.  Musculoskeletal: Negative for myalgias, joint pain and falls.  Neurological: Negative for dizziness, tingling, tremors, sensory change, speech change, focal weakness, seizures, loss of consciousness, weakness and headaches.  Endo/Heme/Allergies: Negative for polydipsia. Does not bruise/bleed easily.  Psychiatric/Behavioral: Negative for depression, suicidal ideas, hallucinations, memory loss and substance abuse. The patient is not nervous/anxious and does not have insomnia.     Blood pressure 92/67, pulse 111, temperature 98.2 F (36.8 C), temperature source Oral, resp. rate 18, height 5' 10.25" (1.784 m), weight 97.523 kg (215 lb).Body mass index is 30.64 kg/(m^2).  General Appearance: Fairly Groomed  Patent attorney::  Fair  Speech:   Pressured  Volume:  Normal  Mood:  expansive  Affect:  grandiose  Thought Process:  delusional  Orientation:  0/3 fixated on military time.   Thought Content:  Delusions , "getting married today."  Suicidal Thoughts:  No  Homicidal Thoughts:  No  Memory:  Immediate;   Fair  Judgement:  Impaired  Insight:  poor  Psychomotor Activity:  Restlessness  Concentration:  Poor  Recall:  Fair  Akathisia:  No  Handed:  Right  AIMS (if indicated):     Assets:  Social Support  Sleep:  Number of Hours: 2.25   Current Medications: Current Facility-Administered Medications  Medication Dose Route Frequency Provider Last Rate Last Dose  . acetaminophen (TYLENOL) tablet 650 mg  650 mg Oral Q6H PRN Kerry Hough, PA-C      . alum & mag hydroxide-simeth (MAALOX/MYLANTA) 200-200-20 MG/5ML suspension 30 mL  30 mL Oral Q4H PRN Kerry Hough, PA-C   30 mL at 12/29/12 0132  . hydrOXYzine (ATARAX/VISTARIL) tablet 25 mg  25 mg Oral Q6H PRN Sanjuana Kava, NP   25 mg at 01/01/13 1644  . lisinopril (PRINIVIL,ZESTRIL) tablet 10 mg  10 mg Oral Daily Kerry Hough, PA-C   10 mg at 01/06/13 0801  . magnesium hydroxide (MILK OF MAGNESIA) suspension 30 mL  30 mL Oral Daily PRN Kerry Hough, PA-C      . polyvinyl alcohol (LIQUIFILM TEARS) 1.4 % ophthalmic solution 1 drop  1 drop Both Eyes PRN Thermon Leyland, NP   1 drop at 01/06/13 2140  . risperiDONE (RISPERDAL) tablet 2 mg  2 mg Oral BID Verne Spurr, PA-C   2 mg at 01/07/13 0859  . simvastatin (ZOCOR) tablet 5 mg  5  mg Oral q1800 Kerry Hough, PA-C   5 mg at 01/06/13 1620  . traZODone (DESYREL) tablet 100 mg  100 mg Oral QHS Verne Spurr, PA-C   100 mg at 01/06/13 2140  . vitamin B-12 (CYANOCOBALAMIN) tablet 100 mcg  100 mcg Oral Daily Kathlen Mody, MD   100 mcg at 01/07/13 0859  . white petrolatum (VASELINE) gel   Topical Daily Mojeed Akintayo        Lab Results:  Results for orders placed during the hospital encounter of 12/25/12 (from the past 48  hour(s))  GLUCOSE, CAPILLARY     Status: None   Collection Time    01/05/13 12:03 PM      Result Value Range   Glucose-Capillary 77  70 - 99 mg/dL   Comment 1 Documented in Chart     Comment 2 Notify RN    GLUCOSE, CAPILLARY     Status: Abnormal   Collection Time    01/05/13  5:10 PM      Result Value Range   Glucose-Capillary 110 (*) 70 - 99 mg/dL   Comment 1 Documented in Chart     Comment 2 Notify RN    GLUCOSE, CAPILLARY     Status: Abnormal   Collection Time    01/06/13  6:21 AM      Result Value Range   Glucose-Capillary 122 (*) 70 - 99 mg/dL  GLUCOSE, CAPILLARY     Status: None   Collection Time    01/06/13 12:11 PM      Result Value Range   Glucose-Capillary 94  70 - 99 mg/dL  GLUCOSE, CAPILLARY     Status: Abnormal   Collection Time    01/06/13  4:45 PM      Result Value Range   Glucose-Capillary 105 (*) 70 - 99 mg/dL   Comment 1 Notify RN    GLUCOSE, CAPILLARY     Status: None   Collection Time    01/07/13  6:25 AM      Result Value Range   Glucose-Capillary 85  70 - 99 mg/dL    Physical Findings: 1+ pitting edema to both lower extremities, skin is cracking. AIMS: Facial and Oral Movements Muscles of Facial Expression: None, normal Lips and Perioral Area: None, normal Jaw: None, normal Tongue: None, normal,Extremity Movements Upper (arms, wrists, hands, fingers): None, normal Lower (legs, knees, ankles, toes): None, normal, Trunk Movements Neck, shoulders, hips: None, normal, Overall Severity Severity of abnormal movements (highest score from questions above): None, normal Incapacitation due to abnormal movements: None, normal Patient's awareness of abnormal movements (rate only patient's report): No Awareness, Dental Status Current problems with teeth and/or dentures?: No Does patient usually wear dentures?: No  CIWA:  CIWA-Ar Total: 1 COWS:     Treatment Plan Summary: Daily contact with patient to assess and evaluate symptoms and progress in  treatment Medication management  Plan: 1. Will place patient on 1:1 to help enforce fluid restriction. 2. Recommend elevation of feet to help with fluid retention in lower extremities. 3. Will change Risperdal to 1mg  AM and 3mg  PM to help with sleep. 4. Will encourage wheel chair to keep feet elevated. 5. IM has ordered an MRI today which is scheduled. 6. Will increase trazodone to 200mg  today for sleep.  Continue current meds Will IM recs.  Medical Decision Making Problem Points:  Established problem, stable/improving (1), Established problem, worsening (2) and New problem, with no additional work-up planned (3) Data Points:  Review of medication  regiment & side effects (2)  I certify that inpatient services furnished can reasonably be expected to improve the patient's condition.  [name]. Khamiyah Grefe RPAC 11:54 AM 01/07/2013

## 2013-01-07 NOTE — [facility] Group Notes (Signed)
[facility] LCSW Aftercare Discharge Planning Group Note   01/07/2013 3:21 PM  Participation Quality:  Engaged  Mood/Affect:  Appropriate  Depression Rating:    Anxiety Rating:    Thoughts of Suicide:  No Will you contract for safety?   NA  Current AVH:  No Denies, but remains delusional  Plan for Discharge/Comments:  unknown  Transportation Means:   Today Albert Hess states that he is getting remarried to his ex wife in 5.8 seconds at [facility].  He also reminded [place] that he painted all the pictures in the hall.  Mood is good.  No complaints.  Supports: Children  [place], [place] B
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
## 2013-01-07 NOTE — Progress Notes (Signed)
1:1 Note- Pt started on 1:1 because pt has been on fluid restriction and his BLE continue to be edematous, MD wants pt under close observation to ensure that he is compliant with the fluid restriction.

## 2013-01-07 NOTE — Progress Notes (Signed)
D:PAtient arrived back on the unit approximately 2115 tonight from [facility].  Patient   Patient states he is doing ok. Patient visible on the unit and interacting with peers.  Patient states he has learned that some of his best friends he has met here.  Patient denies AI/HI and denies AVH. A: Staff to monitor Q 15 mins for safety.  Encouragement and support offered.  Scheduled medications administered per orders. R: Patient remains safe on the unit.  Patient did not attend group tonight because he was at [facility] long.  Patient states he wanted to attend group.  Patient taking administered medications.

## 2013-01-07 NOTE — Progress Notes (Signed)
D:  Per pt self inventory pt reports appetite improving , energy level hyper, ability to pay attention improving, rates depression at 0 out of 10 and hopelessness at a 0 out of 10, denies SI/HI/AVH at this time, denies pain.   A:  Emotional support provided, Encouraged pt to continue with treatment plan and attend all group activities, q15 min checks maintained for safety.  R:  Pt is receptive, cooperative and pleasant with staff and peers, attends groups but was late for am group because it took a long time for him to get ready this am.

## 2013-01-07 NOTE — Clinical Social Work Note (Signed)
  Type of Therapy: Process Group Therapy  Participation Level:  Active  Participation Quality:  Attentive  Affect:  Appropriate  Cognitive:  Delusional  Insight:  None  Engagement in Group:  Engaged  Engagement in Therapy:  None  Modes of Intervention:  Activity, Clarification, Education, Problem-solving and Support  Summary of Progress/Problems: Today's group addressed the issue of overcoming obstacles.  Patients were asked to identify their biggest obstacle post d/c that stands in the way of their on-going success, and then problem solve as to how to manage this.  Albert Hess has no obstacles.  States he is a "happy camper."         [name] 01/07/2013   3:24 PM

## 2013-01-08 DIAGNOSIS — M7989 Other specified soft tissue disorders: Secondary | ICD-10-CM

## 2013-01-08 DIAGNOSIS — M79609 Pain in unspecified limb: Secondary | ICD-10-CM

## 2013-01-08 LAB — GLUCOSE, CAPILLARY: Glucose-Capillary: 101 mg/dL — ABNORMAL HIGH (ref 70–99)

## 2013-01-08 MED ORDER — FUROSEMIDE 20 MG PO TABS
20.0000 mg | ORAL_TABLET | Freq: Every day | ORAL | Status: DC
  Administered 2013-01-08 – 2013-01-14 (×7): 20 mg via ORAL
  Filled 2013-01-08 (×9): qty 1
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
## 2013-01-08 NOTE — Clinical Social Work Note (Signed)
Spoke to pt's daughter yesterday.  Helped her understand the constraints of availability post d/c.  She is working on getting his [employer] together so that she can get him connected to the [place].  She asked that we pursue ALF if possible.  She does not know his income, but stated that brother could find out.  I left message for brother asking that he let me know pt's income so that we know about MCD, placement elegibility.

## 2013-01-08 NOTE — Progress Notes (Signed)
*  Preliminary Results* Bilateral lower extremity venous duplex completed. Bilateral lower extremities are negative for deep vein thrombosis. No evidence of Baker's cyst bilaterally.  01/08/2013 2:52 PM Gertie Fey, RDMS, RDCS

## 2013-01-08 NOTE — Progress Notes (Signed)
Patient ID: Albert Hess, male   DOB: 1946/03/23, 67 y.o.   MRN: [id number]  He continues to be grandiose,  manic talking from one subject to another. He went and had his doppler test today and was cooperative during procedure. Upon return he has been in room with his feet elevated.  1 to 1 continues to help maintain his fluid restriction.

## 2013-01-08 NOTE — [facility] Group Notes (Signed)
[facility] LCSW Aftercare Discharge Planning Group Note   01/08/2013 10:16 AM  Participation Quality:  Did not atten  Mood/Affect:   Depression Rating:    Anxiety Rating:    Thoughts of Suicide:  Will you contract for safety?     Current AVH:    Plan for Discharge/Comments:    Transportation Means:   Supports:  [name]

## 2013-01-08 NOTE — Progress Notes (Signed)
1:1 nursing note D: Patient resting in bed with eyes closed.  Respirations even and unlabored.  Patient appears to be in no apparent distress. A: Staff to monitor Q 15 mins for safety. R:  Patient remains safe on the unit.

## 2013-01-08 NOTE — Progress Notes (Signed)
1:1 nursing note On paper progress note for 0400am

## 2013-01-08 NOTE — Progress Notes (Signed)
Patient ID: Albert Hess, male   DOB: 27-Oct-1945, 67 y.o.   MRN: [id number]

## 2013-01-08 NOTE — Clinical Social Work Note (Signed)
[facility] LCSW Group Therapy  01/08/2013 , 2:34 PM   Type of Therapy:  Group Therapy  Participation Level: Did not attend    Summary of Progress/Problems: Today's group focused on the term Diagnosis.  Participants were asked to define the term, and then pronounce whether it is a negative, positive or neutral term.  [name] 01/08/2013 , 2:34 PM

## 2013-01-08 NOTE — Progress Notes (Signed)
1:1 note D: Patient in group at this time.  Patient appears attentive in group at this time.  Patient drinking coffee during group.  Patient remains on 1:1 for safety. A: PAtient remains on continuous 1:1 R: Patient remains safe at this time.

## 2013-01-08 NOTE — Progress Notes (Addendum)
Patient ID: Albert Hess, male   DOB: 1946-08-25, 67 y.o.   MRN: [id number] [facility] MD Progress Note  01/08/2013 11:04 AM Albert Hess  MRN:  [id number]  Subjective: "I'm fine, I'm hyper, I'm just chillin'."   Objective: Nayson is on a 1:1 to limit his fluid intake, and to encourage him to elevate his feet. He slept well last night 6.75 hours. He seems clearer this moring and is oriented to the day and date and year. He is not as grandiose today or as disorganized as yesterday. He does think he is going home today which is incorrect. Due to the pitting edema in his lower extremities IM has ordered VDoppler studies, lasix and a PT consult.  They will also address his need for a higher level of care upon discharge. Discussed this concern with MD, IM MD, and CM regarding care upon discharge. Diagnosis:  Bipolar I disorder, most recent episode (or current) manic   ADL's:  Impaired  Sleep: "Like a bear."  Appetite:  Good  Suicidal Ideation:  denies Homicidal Ideation:  denies AEB (as evidenced by):  Psychiatric Specialty Exam: Review of Systems  Constitutional: Negative.  Negative for fever, chills, weight loss, malaise/fatigue and diaphoresis.  HENT: Negative for congestion and sore throat.   Eyes: Negative for blurred vision, double vision and photophobia.  Respiratory: Negative for cough, shortness of breath and wheezing.   Cardiovascular: Negative for chest pain, palpitations and PND.  Gastrointestinal: Negative for heartburn, nausea, vomiting, abdominal pain, diarrhea and constipation.  Musculoskeletal: Negative for myalgias, joint pain and falls.  Neurological: Negative for dizziness, tingling, tremors, sensory change, speech change, focal weakness, seizures, loss of consciousness, weakness and headaches.  Endo/Heme/Allergies: Negative for polydipsia. Does not bruise/bleed easily.  Psychiatric/Behavioral: Negative for depression, suicidal ideas, hallucinations, memory loss and substance  abuse. The patient is not nervous/anxious and does not have insomnia.     Blood pressure 108/70, pulse 100, temperature 97.6 F (36.4 C), temperature source Oral, resp. rate 18, height 5' 10.25" (1.784 m), weight 97.977 kg (216 lb).Body mass index is 30.78 kg/(m^2).  General Appearance: Fairly Groomed  Patent attorney::  Fair  Speech:  normal  Volume:  Normal  Mood:  Positive cooperative  Affect:  grandiose  Thought Process:  delusional  Orientation:  12/31/20   Thought Content:  Delusions , "going home today."  Suicidal Thoughts:  No  Homicidal Thoughts:  No  Memory:  Immediate;   Fair  Judgement:  Impaired  Insight:  poor  Psychomotor Activity:  Restlessness  Concentration:  Poor  Recall:  Fair  Akathisia:  No  Handed:  Right  AIMS (if indicated):     Assets:  Social Support  Sleep:  Number of Hours: 6.25   Current Medications: Current Facility-Administered Medications  Medication Dose Route Frequency Provider Last Rate Last Dose  . acetaminophen (TYLENOL) tablet 650 mg  650 mg Oral Q6H PRN Kerry Hough, PA-C      . alum & mag hydroxide-simeth (MAALOX/MYLANTA) 200-200-20 MG/5ML suspension 30 mL  30 mL Oral Q4H PRN Kerry Hough, PA-C   30 mL at 12/29/12 0132  . hydrOXYzine (ATARAX/VISTARIL) tablet 25 mg  25 mg Oral Q6H PRN Sanjuana Kava, NP   25 mg at 01/01/13 1644  . lisinopril (PRINIVIL,ZESTRIL) tablet 10 mg  10 mg Oral Daily Kerry Hough, PA-C   10 mg at 01/06/13 0801  . magnesium hydroxide (MILK OF MAGNESIA) suspension 30 mL  30 mL Oral Daily PRN Karleen Hampshire  E Simon, PA-C      . polyvinyl alcohol (LIQUIFILM TEARS) 1.4 % ophthalmic solution 1 drop  1 drop Both Eyes PRN Thermon Leyland, NP   1 drop at 01/06/13 2140  . risperiDONE (RISPERDAL) tablet 2 mg  2 mg Oral BID Verne Spurr, PA-C   2 mg at 01/07/13 0859  . simvastatin (ZOCOR) tablet 5 mg  5 mg Oral q1800 Kerry Hough, PA-C   5 mg at 01/06/13 1620  . traZODone (DESYREL) tablet 100 mg  100 mg Oral QHS Verne Spurr,  PA-C   100 mg at 01/06/13 2140  . vitamin B-12 (CYANOCOBALAMIN) tablet 100 mcg  100 mcg Oral Daily Kathlen Mody, MD   100 mcg at 01/07/13 0859  . white petrolatum (VASELINE) gel   Topical Daily Mojeed Akintayo        Lab Results:  Results for orders placed during the hospital encounter of 12/25/12 (from the past 48 hour(s))  GLUCOSE, CAPILLARY     Status: None   Collection Time    01/05/13 12:03 PM      Result Value Range   Glucose-Capillary 77  70 - 99 mg/dL   Comment 1 Documented in Chart     Comment 2 Notify RN    GLUCOSE, CAPILLARY     Status: Abnormal   Collection Time    01/05/13  5:10 PM      Result Value Range   Glucose-Capillary 110 (*) 70 - 99 mg/dL   Comment 1 Documented in Chart     Comment 2 Notify RN    GLUCOSE, CAPILLARY     Status: Abnormal   Collection Time    01/06/13  6:21 AM      Result Value Range   Glucose-Capillary 122 (*) 70 - 99 mg/dL  GLUCOSE, CAPILLARY     Status: None   Collection Time    01/06/13 12:11 PM      Result Value Range   Glucose-Capillary 94  70 - 99 mg/dL  GLUCOSE, CAPILLARY     Status: Abnormal   Collection Time    01/06/13  4:45 PM      Result Value Range   Glucose-Capillary 105 (*) 70 - 99 mg/dL   Comment 1 Notify RN    GLUCOSE, CAPILLARY     Status: None   Collection Time    01/07/13  6:25 AM      Result Value Range   Glucose-Capillary 85  70 - 99 mg/dL    Physical Findings: 1+ pitting edema to both lower extremities, skin is cracking. AIMS: Facial and Oral Movements Muscles of Facial Expression: None, normal Lips and Perioral Area: None, normal Jaw: None, normal Tongue: None, normal,Extremity Movements Upper (arms, wrists, hands, fingers): None, normal Lower (legs, knees, ankles, toes): None, normal, Trunk Movements Neck, shoulders, hips: None, normal, Overall Severity Severity of abnormal movements (highest score from questions above): None, normal Incapacitation due to abnormal movements: None, normal Patient's  awareness of abnormal movements (rate only patient's report): No Awareness, Dental Status Current problems with teeth and/or dentures?: No Does patient usually wear dentures?: No  CIWA:  CIWA-Ar Total: 1 COWS:     Treatment Plan Summary: Daily contact with patient to assess and evaluate symptoms and progress in treatment Medication management  Plan: 1. Will place patient on 1:1 to help enforce fluid restriction. 2. Recommend elevation of feet to help with fluid retention in lower extremities. 3. Continue current meds as written. 4. IM is ordering a venous  doppler US., Lasix, and a PT consult due to the edema. 5. IM will also order SW consult, and [facility] consult as well. 6. Did discuss with Aydin his need for ALF upon discharge due to his health problems and he accepted this very well.  Medical Decision Making Problem Points:  Established problem, stable/improving (1), Established problem, worsening (2), New problem, with additional work-up planned (4) and New problem, with no additional work-up planned (3) Data Points:  Discuss tests with performing physician (1) Review or order clinical lab tests (1) Review of medication regiment & side effects (2)  I certify that inpatient services furnished can reasonably be expected to improve the patient's condition.  [name]. Cedrik Heindl RPAC 11:04 AM 01/08/2013

## 2013-01-08 NOTE — Progress Notes (Signed)
D: Patient in his room elevating his feet on first approach.  Patient states "My day was better than it was yesterday."  Patient states he is going home soon, "I am waiting on a billion dollars for this agent orange."  Patient remains delusional and tangential.  Patient on strict fluid restrictions but states he has not been drinking much today.  Patient remains on 1:1 for fluid restrictions and safety.  Patient has +1 pitting edema to bilateral lower extremities. A: Staff to monitor Q 15 mins for safety.  Encouragement and support offered.  Scheduled medications administered per orders.  Patient encouraged to elevate his feet.  [name] and denies AVH. R: Patient remains safe on the unit.  Patient attended group tonight.  Patient calm, cooperative and taking administered medications.  Patient visible on the unit and interacting with peers.  Patient has been elevating his feet in his room and in the dayroom.

## 2013-01-08 NOTE — Progress Notes (Signed)
Patient ID: Albert Hess, male   DOB: [birth year], 67 y.o.   MRN: [id number] Late entry for 11:15am: He has been up and about in room and hallway. Has had manic  Thoughts jumping from one subject to another. He became angry with me when I suggested he sit down to put his pants on.Stated" I have been putting my pants on this way for 65 years". He continues to have +1 edema of feet and lower leg. He is on 1 to 1 to help him maintain the 1200 cc fluid restriction. 1 to 1 continues .

## 2013-01-09 LAB — GLUCOSE, CAPILLARY
Glucose-Capillary: 111 mg/dL — ABNORMAL HIGH (ref 70–99)
Glucose-Capillary: 94 mg/dL (ref 70–99)

## 2013-01-09 MED ORDER — RAMELTEON 8 MG PO TABS
8.0000 mg | ORAL_TABLET | Freq: Every day | ORAL | Status: DC
  Administered 2013-01-09 – 2013-01-20 (×11): 8 mg via ORAL
  Filled 2013-01-09 (×13): qty 1

## 2013-01-09 NOTE — Progress Notes (Signed)
Adult Psychoeducational Group Note  Date: 01/09/2013  Time: 11:00am  Group Topic/Focus:  Crisis Planning: The purpose of this group is to help patients create a crisis plan for use upon discharge or in the future, as needed.  Participation Level: Active  Participation Quality: Appropriate, Sharing and Supportive  Affect: Appropriate  Cognitive: Appropriate  Insight: Appropriate  Engagement in Group: Supportive  Modes of Intervention: Education, Problem-solving and Support  Additional Comments: none  Melysa Schroyer M  01/09/2013, 5:19 PM  

## 2013-01-09 NOTE — Progress Notes (Signed)
Observation note: pt in room sitting in chair with sitter at bedside. Pt just returned from supper. Per sitter, pt had 240 ml at supper time. Pt is allowed 240 ml for the remaining of the day. Pt denies pain and show no s/s of distress. Pt remains on 1:1 observation until d/c'd. Pt remains safe.

## 2013-01-09 NOTE — Progress Notes (Signed)
D: Patient in the dayroom on approach.  Patient sitting with his feet elevated.  Patient still has +1 pitting edema in bilateral lower extremities.  Patient swelling does appear to be slightly better than yesterday.  Patient states the swelling is getting better also.  Patient continues to be delusional.  Patient he had one billion dollars and he is going to give Clinical research associate money.  PAtietn also states he wants his sleeping medication and writer explained to patient that it is not time for bed and patient ok with that answer.  Patient has to be redirected but he is redirectable.  [name] and denies AVH A: Staff to monitor Q 15 mins for safety.  Encouragement and support offered.  Scheduled medications administered per orders.  Patient eye gtts administered prn tonight. R: Patient remains safe on the unit.  Patient on continuous observation for strict I&O.  Patient attended group tonight.  Patient taking administered medications

## 2013-01-09 NOTE — Progress Notes (Signed)
Nursing 1:1 not on paper progress note at bedside

## 2013-01-09 NOTE — Progress Notes (Signed)
Observation note: pt in room sitting in chair with his legs elevated. Sitter at bedside. Pt continues to be delusional. Pt fluid intake 1,140 ml as of now. Pt continues to be on 1:1 observation until d/c'd.

## 2013-01-09 NOTE — Progress Notes (Signed)
Observation note: Pt in hallway with sitter present. Pt denies pain and show no s/s of distress. Pt presents with delusional thoughts. 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 Pt is mildly confused and needs redirecting by staff. Pt forgets when he receives meds and then ask for them a minute later. Pt is ambulatory, steady gait at this time. Pt has bilateral edema in his lower extremities. Pt encouraged to rest and elevate his legs. Pt continues to be monitored for fluid restrictions. Per sitter, pt has not had any fluids this am. Pt remains on 1:1 observation until d/c'd.

## 2013-01-09 NOTE — BHH Group Notes (Signed)
[facility] LCSW Aftercare Discharge Planning Group Note   01/09/2013 10:04 AM  Participation Quality:  Appropriate  Mood/Affect:  Appropriate  Depression Rating:  N/a   Anxiety Rating:  N/a   Thoughts of Suicide:  No Will you contract for safety?   NA  Current AVH:  No  Plan for Discharge/Comments:  Tanor reported that he spent yesterday getting test and scans done on his leg (shrapanel). He stated that he is doing well today and is ready to go home. [place] is convinced that it is Tuesday today and demonstrated disorganized thinking. PA reported that Jonanthony had been introduced to the idea of living in an AL facility yesterday and he had been receptive to this idea (partly due to medical problems).   Transportation Means: son/family  Supports: Chiropractor, [employer]

## 2013-01-09 NOTE — Progress Notes (Signed)
Patient ID: Albert Hess, male   DOB: 07-14-1946, 67 y.o.   MRN: [id number] [facility] MD Progress Note  01/09/2013 3:24 PM TRUTH BAROT  MRN:  [id number]  Subjective:"I'm good," and "I'll do anything you want me to do." Objective: Albert Hess is on a 1:1 to limit his fluid intake, and to encourage him to elevate his feet. He did not sleep last night at all, but states "Like a rock" when asked, then "not too well." Vascular studies did not show any problems. PT consult states no ambulatory issues.  The swelling is still 1+ pitting on both lower extremities despite Lasix yesterday.      Albert Hess is much more disorganized today again likely due to poor sleep.  Diagnosis:  Bipolar I disorder, most recent episode (or current) manic   ADL's:  Impaired  Sleep: "Like a bear." documented 2.1 hours  Appetite:  Good  Suicidal Ideation:  denies Homicidal Ideation:  denies AEB (as evidenced by):  Psychiatric Specialty Exam: Review of Systems  Constitutional: Negative.  Negative for fever, chills, weight loss, malaise/fatigue and diaphoresis.  HENT: Negative for congestion and sore throat.   Eyes: Negative for blurred vision, double vision and photophobia.  Respiratory: Negative for cough, shortness of breath and wheezing.   Cardiovascular: Negative for chest pain, palpitations and PND.  Gastrointestinal: Negative for heartburn, nausea, vomiting, abdominal pain, diarrhea and constipation.  Musculoskeletal: Negative for myalgias, joint pain and falls.  Neurological: Negative for dizziness, tingling, tremors, sensory change, speech change, focal weakness, seizures, loss of consciousness, weakness and headaches.  Endo/Heme/Allergies: Negative for polydipsia. Does not bruise/bleed easily.  Psychiatric/Behavioral: Negative for depression, suicidal ideas, hallucinations, memory loss and substance abuse. The patient is not nervous/anxious and does not have insomnia.     Blood pressure 104/68, pulse 101, temperature 97.7  F (36.5 C), temperature source Oral, resp. rate 16, height 5' 10.25" (1.784 m), weight 98.431 kg (217 lb).Body mass index is 30.93 kg/(m^2).  General Appearance: Fairly Groomed  Patent attorney::  Fair  Speech:  normal  Volume:  Normal  Mood:  Positive cooperative  Affect:  grandiose  Thought Process:  delusional  Orientation:  05/16/13  Thought Content:  Delusions , "going home today."  Suicidal Thoughts:  No  Homicidal Thoughts:  No  Memory:  Immediate;   Fair  Judgement:  Impaired  Insight:  poor  Psychomotor Activity:  Restlessness  Concentration:  Poor  Recall:  Fair  Akathisia:  No  Handed:  Right  AIMS (if indicated):     Assets:  Social Support  Sleep:  Number of Hours: 2.1   Current Medications: Current Facility-Administered Medications  Medication Dose Route Frequency Provider Last Rate Last Dose  . acetaminophen (TYLENOL) tablet 650 mg  650 mg Oral Q6H PRN Kerry Hough, PA-C      . alum & mag hydroxide-simeth (MAALOX/MYLANTA) 200-200-20 MG/5ML suspension 30 mL  30 mL Oral Q4H PRN Kerry Hough, PA-C   30 mL at 12/29/12 0132  . hydrOXYzine (ATARAX/VISTARIL) tablet 25 mg  25 mg Oral Q6H PRN Sanjuana Kava, NP   25 mg at 01/01/13 1644  . lisinopril (PRINIVIL,ZESTRIL) tablet 10 mg  10 mg Oral Daily Kerry Hough, PA-C   10 mg at 01/06/13 0801  . magnesium hydroxide (MILK OF MAGNESIA) suspension 30 mL  30 mL Oral Daily PRN Kerry Hough, PA-C      . polyvinyl alcohol (LIQUIFILM TEARS) 1.4 % ophthalmic solution 1 drop  1 drop Both Eyes  PRN Thermon Leyland, NP   1 drop at 01/06/13 2140  . risperiDONE (RISPERDAL) tablet 2 mg  2 mg Oral BID Verne Spurr, PA-C   2 mg at 01/07/13 0859  . simvastatin (ZOCOR) tablet 5 mg  5 mg Oral q1800 Kerry Hough, PA-C   5 mg at 01/06/13 1620  . traZODone (DESYREL) tablet 100 mg  100 mg Oral QHS Verne Spurr, PA-C   100 mg at 01/06/13 2140  . vitamin B-12 (CYANOCOBALAMIN) tablet 100 mcg  100 mcg Oral Daily Kathlen Mody, MD   100 mcg at  01/07/13 0859  . white petrolatum (VASELINE) gel   Topical Daily Mojeed Akintayo        Lab Results:  Results for orders placed during the hospital encounter of 12/25/12 (from the past 48 hour(s))  GLUCOSE, CAPILLARY     Status: None   Collection Time    01/05/13 12:03 PM      Result Value Range   Glucose-Capillary 77  70 - 99 mg/dL   Comment 1 Documented in Chart     Comment 2 Notify RN    GLUCOSE, CAPILLARY     Status: Abnormal   Collection Time    01/05/13  5:10 PM      Result Value Range   Glucose-Capillary 110 (*) 70 - 99 mg/dL   Comment 1 Documented in Chart     Comment 2 Notify RN    GLUCOSE, CAPILLARY     Status: Abnormal   Collection Time    01/06/13  6:21 AM      Result Value Range   Glucose-Capillary 122 (*) 70 - 99 mg/dL  GLUCOSE, CAPILLARY     Status: None   Collection Time    01/06/13 12:11 PM      Result Value Range   Glucose-Capillary 94  70 - 99 mg/dL  GLUCOSE, CAPILLARY     Status: Abnormal   Collection Time    01/06/13  4:45 PM      Result Value Range   Glucose-Capillary 105 (*) 70 - 99 mg/dL   Comment 1 Notify RN    GLUCOSE, CAPILLARY     Status: None   Collection Time    01/07/13  6:25 AM      Result Value Range   Glucose-Capillary 85  70 - 99 mg/dL    Physical Findings: 1+ pitting edema to both lower extremities, skin is cracking. AIMS: Facial and Oral Movements Muscles of Facial Expression: None, normal Lips and Perioral Area: None, normal Jaw: None, normal Tongue: None, normal,Extremity Movements Upper (arms, wrists, hands, fingers): None, normal Lower (legs, knees, ankles, toes): None, normal, Trunk Movements Neck, shoulders, hips: None, normal, Overall Severity Severity of abnormal movements (highest score from questions above): None, normal Incapacitation due to abnormal movements: None, normal Patient's awareness of abnormal movements (rate only patient's report): No Awareness, Dental Status Current problems with teeth and/or  dentures?: No Does patient usually wear dentures?: No  CIWA:  CIWA-Ar Total: 1 COWS:     Treatment Plan Summary: Daily contact with patient to assess and evaluate symptoms and progress in treatment Medication management  Plan: 1. Continue patient on 1:1 to help enforce fluid restriction. 2. Recommend elevation of feet to help with fluid retention in lower extremities. 3. Continue current meds as written. 4. Reviewed results of PT consult and doppler studies. 5. Did discuss sleep medication with Tx Team this AM including MD. Will add Rozerum for sleep. 6. Did discuss with Loraine Leriche  his need for ALF upon discharge due to his health problems and he accepted this very well. 7. CM is requesting [facility] number to facilitate ALF placement. 8  UR did call and inquired about using a mood stabilizer and was notified of his current issues with hyponatremia Medical Decision Making Problem Points:  Established problem, stable/improving (1), Established problem, worsening (2), New problem, with additional work-up planned (4) and New problem, with no additional work-up planned (3) Data Points:  Discuss tests with performing physician (1) Review or order clinical lab tests (1) Review of medication regiment & side effects (2)  I certify that inpatient services furnished can reasonably be expected to improve the patient's condition.  [name]. Netra Postlethwait RPAC 3:24 PM 01/09/2013

## 2013-01-09 NOTE — Progress Notes (Signed)
1:1 Nsg note 0400 D: Pt in chair on the side of his bed. Pt has been organizing his socks. Pt discusses his sock care regimen with this Clinical research associate. Pt has been wearing his red socks in the shower and drying them out on the air conditioner unit. Writer informed pt that we can supply him with some dry and clean socks. A: Writer retrieved a new pair of red socks for this pt. Pt was thankful for the socks. 1:1 continuous observation remains for this pt.  R: Pt remains safe at this time.

## 2013-01-09 NOTE — Progress Notes (Addendum)
1:1 Nsg Note 0000  D: Pt in bed resting with eyes closed in supine position. Pt heard snoring. Pt respirations are even and unlabored. Pt appears to be in no signs of distress at this time.  A: 1:1 continious observation for management of carb and intake restrictions continues for this pt.  R: Pt remains safe at this time.

## 2013-01-09 NOTE — Progress Notes (Signed)
PT  Note  Patient Details Name: Albert Hess MRN: [id number] DOB: [birth year]   Chart reviewed and spoke with pt's nurse addressing any issues for skilled PT needs. Pt has been ambulating in room and hallways without difficulty as reported by staff. Did enter into pt's room and he walked in room without difficulty with turning and quick steps.  Do not feel patient has any skilled PT needs at this time.  Noted BLE swelling , pt sitting with BLEs elevated and performs foot/ankle pumps without diffuculty. Do not have any further ideas to address swelling with patient's careover except for continue/encourage normal mobility throughout the day.    Do agree with WOC nurse to have patient follow up with Dr. Lajoyce Corners for further diagnosis with LE swelling (arterial versus venus, etc) for possible other treatment options.   No other PT needs at this time. If any questions or other needs while here, please don't hesitate to call. Thanks  [phone]   Marella Bile 01/09/2013, 2:47 PM Marella Bile, PT Pager: [phone] 01/09/2013

## 2013-01-09 NOTE — Clinical Social Work Note (Signed)
[facility] [employer] Group Therapy  01/09/2013 , 1:40 PM    Type of Therapy:  [employer] Presentation  Participation Level:  Did not attend    Summary of Progress/Problems:  Onalee Hua from [employer] came to present his recovery story and play the guitar.    [name] 01/09/2013 , 1:40 PM

## 2013-01-09 NOTE — Progress Notes (Signed)
Recreation Therapy Notes   Date: 04.23.2014  Time: 9:30am Location: 400 Hall Day Room      Group Topic/Focus: Leisure Education & Memory  Participation Level: Did not attend   Additional Comments: Patient greeted LRT brightly as she entered the 400 hall. 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 Patient followed LRT into the day room and stated he was going to participate in group session. Approximately 5 minutes into group session patient stated he needed to leave to see what time he was going to be discharged. Patient stated he has been a patient at [facility] for 6 weeks and he is going home today. LRT told patient he has not been at [facility] for that long, but patient was convinced he has been a patient for that amount of time.   Patient did not return to group session.   Marykay Lex Hansford Hirt, LRT/CTRS  Jearl Klinefelter 01/09/2013 12:45 PM

## 2013-01-10 DIAGNOSIS — F0390 Unspecified dementia without behavioral disturbance: Secondary | ICD-10-CM

## 2013-01-10 LAB — COMPREHENSIVE METABOLIC PANEL
ALT: 14 U/L (ref 0–53)
Alkaline Phosphatase: 67 U/L (ref 39–117)
CO2: 29 mEq/L (ref 19–32)
Calcium: 9.7 mg/dL (ref 8.4–10.5)
GFR calc Af Amer: >90 mL/min (ref >90)
GFR calc non Af Amer: 86 mL/min — ABNORMAL LOW (ref >90)
Glucose, Bld: 90 mg/dL (ref 70–99)
Sodium: 140 mEq/L (ref 135–145)
Total Bilirubin: 0.2 mg/dL — ABNORMAL LOW (ref 0.3–1.2)

## 2013-01-10 LAB — GLUCOSE, CAPILLARY

## 2013-01-10 MED ORDER — QUETIAPINE FUMARATE 25 MG PO TABS
25.0000 mg | ORAL_TABLET | Freq: Every day | ORAL | Status: DC
  Administered 2013-01-10 – 2013-01-13 (×4): 25 mg via ORAL
  Filled 2013-01-10 (×5): qty 1

## 2013-01-10 NOTE — Progress Notes (Signed)
Patient ID: Albert Hess, male   DOB: [birth year], 67 y.o.   MRN: [id number] 1:1 observation note:  Patient remains on fluid restriction and has maxed out until 2300.  He has had to maximum limit today of 1500 mls.  Patient has been ambulating in the hallway and cafeteria with a wheelchair and he is keeping both legs elevated when in room.  He has been talkative and cooperative today with bright affect.

## 2013-01-10 NOTE — Progress Notes (Signed)
D: Patient on wheel chair in his room during this assessment. He seemed to be mildly confused as to when he is going to be discharged. Patient stated; "I'm waiting for my ride to go home, is my ride here yet"? He kept chanting the same thing over and over.  He denied SI/HI and denied hallucinations, mood and affect flat and blunted. Although he was very polite during this assessment. A: Writer tried explaining to patient that he had no discharge order and that the doctors have left for the day. Encouraged and supported patient. R: Patient has fixed delusion about his discharge tonight. Will continue to monitor and redirect.  Q 15 minute check continues as ordered to maintain safety.

## 2013-01-10 NOTE — [facility] Suicide Risk Assessment (Signed)
[facility] INPATIENT:  Family/Significant Other Suicide Prevention Education  Suicide Prevention Education:  Education Completed: No one as been identified by the patient as the family member/significant other with whom the patient will be residing, and identified as the person(s) who will aid the patient in the event of a mental health crisis (suicidal ideations/suicide attempt).  With written consent from the patient, the family member/significant other has been provided the following suicide prevention education, prior to the and/or following the discharge of the patient.  The suicide prevention education provided includes the following:  Suicide risk factors  Suicide prevention and interventions  National Suicide Hotline telephone number  [facility] assessment telephone number  [facility] Emergency Assistance 911  [facility] and/or Residential Mobile Crisis Unit telephone number  Request made of family/significant other to:  Remove weapons (e.g., guns, rifles, knives), all items previously/currently identified as safety concern.    Remove drugs/medications (over-the-counter, prescriptions, illicit drugs), all items previously/currently identified as a safety concern.  Patient did not endorse SI upon admission to [facility] and did not complain of suicidal thoughts during stay at [facility] Therefore, SPE not required for this patient.   Smart, Heather N 01/10/2013, 11:13 AM

## 2013-01-10 NOTE — Progress Notes (Signed)
Patient ID: Albert Hess, male   DOB: [birth year], 67 y.o.   MRN: [id number] 1:1 observation note:  Patient remains cooperative; bright affect.  He remains on close ob due to fluid restriction.  Patient is restricted to 1500 mls/daily.  He is ambulating with wheelchair while in the hallway.  Patient is to keep legs elevated due to 1+ edema.  Patient's BP is running low, held linsinopril and laxis this am; blood pressure improved so it was administered.  Patient's behavior has been appropriate.

## 2013-01-10 NOTE — Progress Notes (Signed)
Patient ID: Albert Hess, male   DOB: 1945-11-29, 67 y.o.   MRN: [id number] 1;1 observation note:  Patient is bright, pleasant and cooperative.  His thought processes are still disorganized and he makes grandious statement about receiving a million dollars for being exposed to agent orange.  Patient will use wheelchair while in the hallways and to keep legs elevated while in room.  He remains on fluid restriction.  [name]/AVH.  He has been med compliant.  Continue to monitor medication management and MD orders.  Safety checks completed every 15 minutes per protocol.  Patient's behavior has been appropriate.

## 2013-01-10 NOTE — Progress Notes (Signed)
1:1 note written on patient paper progress notes

## 2013-01-10 NOTE — [facility] Group Notes (Signed)
[facility] LCSW Aftercare Discharge Planning Group Note   01/10/2013 10:07 AM  Participation Quality:  Appropriate   Mood/Affect:  Appropriate  Depression Rating:  None   Anxiety Rating:  None   Thoughts of Suicide:  No Will you contract for safety?   NA  Current AVH:  No  Plan for Discharge/Comments:  Albert Hess reported that he feels "excellent" this morning although he only slept a few hours last night. He was made aware that SW spoke with his daughter in law yesterday afternoon. Pt talked about his family and about being a grandfather. SW will make pt referral to [facility] per wishes of pt's family.   Transportation Means: son/family  Supports: Chiropractor, [employer]

## 2013-01-10 NOTE — [facility] Group Notes (Signed)
[facility] LCSW Group Therapy  01/10/2013 2:53 PM  Type of Therapy:  Group Therapy  Participation Level:  Did Not Attend  Smart, [name] 01/10/2013, 2:53 PM

## 2013-01-10 NOTE — Progress Notes (Signed)
Patient ID: Albert Hess, male   DOB: 25-Jul-1946, 67 y.o.   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 His feet and lower extremities continue to be swollen with 1-2+ pitting edema. Despite the Rozarem he did not sleep last night and continues to be disorganized and confused. He is currently awaiting ALF facility placement due to his current mental status as well as his medical problems.  He is currently not safe to live alone and does not have the capacity to take his medication correctly or care for his chronic medical problems. Diagnosis:  Bipolar I disorder, most recent episode (or current) manic                      Dementia  ADL's:  Impaired  Sleep: "Like a bear." documented. 2.75  Appetite:  Good  Suicidal Ideation:  denies Homicidal Ideation:  denies AEB (as evidenced by):  Psychiatric Specialty Exam: Review of Systems  Constitutional: Negative.  Negative for fever, chills, weight loss, malaise/fatigue and diaphoresis.  HENT: Negative for congestion and sore throat.   Eyes: Negative for blurred vision, double vision and photophobia.  Respiratory: Negative for cough, shortness of breath and wheezing.   Cardiovascular: Negative for chest pain, palpitations and PND.  Gastrointestinal: Negative for heartburn, nausea, vomiting, abdominal pain, diarrhea and constipation.  Musculoskeletal: Negative for myalgias, joint pain and falls.  Neurological: Negative for dizziness, tingling, tremors, sensory change, speech change, focal weakness, seizures, loss of consciousness, weakness and headaches.  Endo/Heme/Allergies: Negative for polydipsia. Does not bruise/bleed easily.  Psychiatric/Behavioral: Negative for depression, suicidal ideas,  hallucinations, memory loss and substance abuse. The patient is not nervous/anxious and does not have insomnia.     Blood pressure 99/64, pulse 95, temperature 97.8 F (36.6 C), temperature source Oral, resp. rate 18, height 5' 10.25" (1.784 m), weight 98.884 kg (218 lb).Body mass index is 31.07 kg/(m^2).  General Appearance: Fairly Groomed  Patent attorney::  Fair  Speech:  little  Volume:  Normal  Mood:  Positive cooperative  Affect:  grandiose  Thought Process:  delusional  Orientation:  05/16/13  Thought Content:  Delusions , "going home today."  Suicidal Thoughts:  No  Homicidal Thoughts:  No  Memory:  Immediate;   Fair  Judgement:  Impaired  Insight:  poor  Psychomotor Activity:  Restlessness  Concentration:  Poor  Recall:  Fair  Akathisia:  No  Handed:  Right  AIMS (if indicated):     Assets:  Social Support  Sleep:  Number of Hours: 2.75   Current Medications: Current Facility-Administered Medications  Medication Dose Route Frequency Provider Last Rate Last Dose  . acetaminophen (TYLENOL) tablet 650 mg  650 mg Oral Q6H PRN Kerry Hough, PA-C      . alum & mag hydroxide-simeth (MAALOX/MYLANTA) 200-200-20 MG/5ML suspension 30 mL  30 mL Oral Q4H PRN Kerry Hough, PA-C   30 mL at 12/29/12 0132  . hydrOXYzine (ATARAX/VISTARIL) tablet 25 mg  25 mg Oral Q6H PRN Sanjuana Kava, NP   25 mg at 01/01/13 1644  . lisinopril (PRINIVIL,ZESTRIL) tablet 10 mg  10 mg Oral Daily Kerry Hough, PA-C   10 mg at 01/06/13 0801  .  magnesium hydroxide (MILK OF MAGNESIA) suspension 30 mL  30 mL Oral Daily PRN Kerry Hough, PA-C      . polyvinyl alcohol (LIQUIFILM TEARS) 1.4 % ophthalmic solution 1 drop  1 drop Both Eyes PRN Thermon Leyland, NP   1 drop at 01/06/13 2140  . risperiDONE (RISPERDAL) tablet 2 mg  2 mg Oral BID Verne Spurr, PA-C   2 mg at 01/07/13 0859  . simvastatin (ZOCOR) tablet 5 mg  5 mg Oral q1800 Kerry Hough, PA-C   5 mg at 01/06/13 1620  . traZODone (DESYREL) tablet  100 mg  100 mg Oral QHS Verne Spurr, PA-C   100 mg at 01/06/13 2140  . vitamin B-12 (CYANOCOBALAMIN) tablet 100 mcg  100 mcg Oral Daily Kathlen Mody, MD   100 mcg at 01/07/13 0859  . white petrolatum (VASELINE) gel   Topical Daily Mojeed Akintayo        Lab Results:  Results for orders placed during the hospital encounter of 12/25/12 (from the past 48 hour(s))  GLUCOSE, CAPILLARY     Status: None   Collection Time    01/05/13 12:03 PM      Result Value Range   Glucose-Capillary 77  70 - 99 mg/dL   Comment 1 Documented in Chart     Comment 2 Notify RN    GLUCOSE, CAPILLARY     Status: Abnormal   Collection Time    01/05/13  5:10 PM      Result Value Range   Glucose-Capillary 110 (*) 70 - 99 mg/dL   Comment 1 Documented in Chart     Comment 2 Notify RN    GLUCOSE, CAPILLARY     Status: Abnormal   Collection Time    01/06/13  6:21 AM      Result Value Range   Glucose-Capillary 122 (*) 70 - 99 mg/dL  GLUCOSE, CAPILLARY     Status: None   Collection Time    01/06/13 12:11 PM      Result Value Range   Glucose-Capillary 94  70 - 99 mg/dL  GLUCOSE, CAPILLARY     Status: Abnormal   Collection Time    01/06/13  4:45 PM      Result Value Range   Glucose-Capillary 105 (*) 70 - 99 mg/dL   Comment 1 Notify RN    GLUCOSE, CAPILLARY     Status: None   Collection Time    01/07/13  6:25 AM      Result Value Range   Glucose-Capillary 85  70 - 99 mg/dL    Physical Findings: 1+ pitting edema to both lower extremities, skin is cracking. AIMS: Facial and Oral Movements Muscles of Facial Expression: None, normal Lips and Perioral Area: None, normal Jaw: None, normal Tongue: None, normal,Extremity Movements Upper (arms, wrists, hands, fingers): None, normal Lower (legs, knees, ankles, toes): None, normal, Trunk Movements Neck, shoulders, hips: None, normal, Overall Severity Severity of abnormal movements (highest score from questions above): None, normal Incapacitation due to abnormal  movements: None, normal Patient's awareness of abnormal movements (rate only patient's report): No Awareness, Dental Status Current problems with teeth and/or dentures?: No Does patient usually wear dentures?: No  CIWA:  CIWA-Ar Total: 1 COWS:     Treatment Plan Summary: Daily contact with patient to assess and evaluate symptoms and progress in treatment Medication management  Plan: 1. Continue patient on 1:1 to help enforce fluid restriction. 2. Recommend elevation of feet to help with fluid retention in  lower extremities. 3. Continue current meds as written. 4. Reviewed results of PT consult and doppler studies. 5. Did discuss sleep medication with Tx Team this AM including MD. Will add Rozerum for sleep. 6. Did discuss with Talon his need for ALF upon discharge due to his health problems and he accepted this very well. 7. CM is requesting [facility] number to facilitate ALF placement. 8. Did discuss adding 2nd antipsychotic "seroquel" in small does at hs to improve sleep with Dr. Shirl Harris who agreed that this is a reasonable plan so will start Seroquel 25mg  at hs to improve sleep. 9. Will order CMP to follow electrolyte status tonight. 10. Will continue current regimen as planned while placement is being sought.   Medical Decision Making Problem Points:  Established problem, stable/improving (1), Established problem, worsening (2), New problem, with additional work-up planned (4) and New problem, with no additional work-up planned (3) Data Points:  Discuss tests with performing physician (1) Review or order clinical lab tests (1) Review of medication regiment & side effects (2)  I certify that inpatient services furnished can reasonably be expected to improve the patient's condition.  [name]. Lagena Strand RPAC 12:03 PM 01/10/2013

## 2013-01-10 NOTE — Clinical Social Work Note (Signed)
Yesterday I spoke with family to give them an update.  Asked them to confirm income for Paralee Cancel I had last Friday] so we would know about affordability of ALF.  Family confirmed that, in fact, they had not filed for guardianship as instructed by [facility], so Kristapher is his own guardian.  They remain solely focused on [facility] as solution.  :Had you just put him on wait list when he arrived, we would already be half-way there."  As a way of responding to their concerns and needs, I sent referral information to [facility] today.  They informed me that he had been placed on outpt commitment when he departed [facility], so they are particularly frustrated as they do not really understand the system, and believe that to be grounds for immediate return to [facility].  I attempted to explain the process.  Today Delance was assessed by clinician from [facility] who agrees that [facility] meets criteria for CST at minimum, and likely ACT services.

## 2013-01-10 NOTE — Progress Notes (Signed)
Psychoeducational Group Note  Date:  01/10/2013 Time:  0930  Group Topic/Focus:  Overcoming Stress:   The focus of this group is to define stress and help patients assess their triggers.  Participation Level: Did Not Attend  Participation Quality:  Not Applicable  Affect:  Not Applicable  Cognitive:  Not Applicable  Insight:  Not Applicable  Engagement in Group: Not Applicable  Additional Comments:  Pt refused to attend group this morning.  Doxie Augenstein E 01/10/2013, 2:30 PM

## 2013-01-10 NOTE — Progress Notes (Signed)
1:1 nursing note  D: Patient up this AM.  Patient complained of a headache and was given tylenol and patient stated headache is gone.  Patient states he feels good this morning.  Patient convinces he weighs 162 lbs.  Patient weight this AM was 218 lbs.  Patient continues to states he is going to get a billion dollars and states he is going to give Clinical research associate 10,000 dollars.  [name] and denies AVH A:Patient on 1:1 for strict I&O and safety. R: Patient remains safe.

## 2013-01-11 LAB — GLUCOSE, CAPILLARY

## 2013-01-11 NOTE — Progress Notes (Signed)
Patient ID: Albert Hess, male   DOB: 03/29/1946, 67 y.o.   MRN: [id number] [facility] MD Progress Note  01/11/2013 9:20 AM Albert Hess  MRN:  [id number]  Subjective: "I did sleep, slept hard in fact." "it's Tuesday."  Objective: Veer is on a 1:1 to limit his fluid intake, and is using a wheelchair to ambulate to keep his feet up. He is disorganized but alert and cooperative.  He kept thinking that he was being discharged home last night but was able to be redirected.    At the last minute today [facility] contacted [place] to say that [facility] has a bed for tomorrow.  I spoke to the son and he agreed that he wanted his father to go there so the paper work was completed.  Diagnosis:  Bipolar I disorder, most recent episode (or current) manic   ADL's:  Impaired  Sleep: "Like a bear." documented 2.1 hours  Appetite:  Good  Suicidal Ideation:  denies Homicidal Ideation:  denies AEB (as evidenced by):  Psychiatric Specialty Exam: Review of Systems  Constitutional: Negative.  Negative for fever, chills, weight loss, malaise/fatigue and diaphoresis.  HENT: Negative for congestion and sore throat.   Eyes: Negative for blurred vision, double vision and photophobia.  Respiratory: Negative for cough, shortness of breath and wheezing.   Cardiovascular: Negative for chest pain, palpitations and PND.  Gastrointestinal: Negative for heartburn, nausea, vomiting, abdominal pain, diarrhea and constipation.  Musculoskeletal: Negative for myalgias, joint pain and falls.  Neurological: Negative for dizziness, tingling, tremors, sensory change, speech change, focal weakness, seizures, loss of consciousness, weakness and headaches.  Endo/Heme/Allergies: Negative for polydipsia. Does not bruise/bleed easily.  Psychiatric/Behavioral: Negative for depression, suicidal ideas, hallucinations, memory loss and substance abuse. The patient is not nervous/anxious and does not have insomnia.     Blood pressure 99/64, pulse 95,  temperature 97.8 F (36.6 C), temperature source Oral, resp. rate 18, height 5' 10.25" (1.784 m), weight 98.884 kg (218 lb).Body mass index is 31.07 kg/(m^2).  General Appearance: Fairly Groomed  Patent attorney::  Fair  Speech:  normal  Volume:  Normal  Mood:  Positive cooperative  Affect:  grandiose  Thought Process:  delusional  Orientation:  It's Tuesday.  Thought Content:  Delusions , they are going to do surgery on my leg.  Suicidal Thoughts:  No  Homicidal Thoughts:  No  Memory:  Immediate;   Fair  Judgement:  Impaired  Insight:  poor  Psychomotor Activity:  Restlessness  Concentration:  Poor  Recall:  Fair  Akathisia:  No  Handed:  Right  AIMS (if indicated):     Assets:  Social Support  Sleep:  Number of Hours: 5.5   Current Medications: Current Facility-Administered Medications  Medication Dose Route Frequency Provider Last Rate Last Dose  . acetaminophen (TYLENOL) tablet 650 mg  650 mg Oral Q6H PRN Kerry Hough, PA-C      . alum & mag hydroxide-simeth (MAALOX/MYLANTA) 200-200-20 MG/5ML suspension 30 mL  30 mL Oral Q4H PRN Kerry Hough, PA-C   30 mL at 12/29/12 0132  . hydrOXYzine (ATARAX/VISTARIL) tablet 25 mg  25 mg Oral Q6H PRN Sanjuana Kava, NP   25 mg at 01/01/13 1644  . lisinopril (PRINIVIL,ZESTRIL) tablet 10 mg  10 mg Oral Daily Kerry Hough, PA-C   10 mg at 01/06/13 0801  . magnesium hydroxide (MILK OF MAGNESIA) suspension 30 mL  30 mL Oral Daily PRN Kerry Hough, PA-C      .  polyvinyl alcohol (LIQUIFILM TEARS) 1.4 % ophthalmic solution 1 drop  1 drop Both Eyes PRN Thermon Leyland, NP   1 drop at 01/06/13 2140  . risperiDONE (RISPERDAL) tablet 2 mg  2 mg Oral BID Verne Spurr, PA-C   2 mg at 01/07/13 0859  . simvastatin (ZOCOR) tablet 5 mg  5 mg Oral q1800 Kerry Hough, PA-C   5 mg at 01/06/13 1620  . traZODone (DESYREL) tablet 100 mg  100 mg Oral QHS Verne Spurr, PA-C   100 mg at 01/06/13 2140  . vitamin B-12 (CYANOCOBALAMIN) tablet 100 mcg  100 mcg  Oral Daily Kathlen Mody, MD   100 mcg at 01/07/13 0859  . white petrolatum (VASELINE) gel   Topical Daily Mojeed Akintayo        Lab Results:  Results for orders placed during the Hess encounter of 12/25/12 (from the past 48 hour(s))  GLUCOSE, CAPILLARY     Status: None   Collection Time    01/05/13 12:03 PM      Result Value Range   Glucose-Capillary 77  70 - 99 mg/dL   Comment 1 Documented in Chart     Comment 2 Notify RN    GLUCOSE, CAPILLARY     Status: Abnormal   Collection Time    01/05/13  5:10 PM      Result Value Range   Glucose-Capillary 110 (*) 70 - 99 mg/dL   Comment 1 Documented in Chart     Comment 2 Notify RN    GLUCOSE, CAPILLARY     Status: Abnormal   Collection Time    01/06/13  6:21 AM      Result Value Range   Glucose-Capillary 122 (*) 70 - 99 mg/dL  GLUCOSE, CAPILLARY     Status: None   Collection Time    01/06/13 12:11 PM      Result Value Range   Glucose-Capillary 94  70 - 99 mg/dL  GLUCOSE, CAPILLARY     Status: Abnormal   Collection Time    01/06/13  4:45 PM      Result Value Range   Glucose-Capillary 105 (*) 70 - 99 mg/dL   Comment 1 Notify RN    GLUCOSE, CAPILLARY     Status: None   Collection Time    01/07/13  6:25 AM      Result Value Range   Glucose-Capillary 85  70 - 99 mg/dL    Physical Findings: 1+ pitting edema to both lower extremities, skin is cracking. AIMS: Facial and Oral Movements Muscles of Facial Expression: None, normal Lips and Perioral Area: None, normal Jaw: None, normal Tongue: None, normal,Extremity Movements Upper (arms, wrists, hands, fingers): None, normal Lower (legs, knees, ankles, toes): None, normal, Trunk Movements Neck, shoulders, hips: None, normal, Overall Severity Severity of abnormal movements (highest score from questions above): None, normal Incapacitation due to abnormal movements: None, normal Patient's awareness of abnormal movements (rate only patient's report): No Awareness, Dental  Status Current problems with teeth and/or dentures?: No Does patient usually wear dentures?: No  CIWA:  CIWA-Ar Total: 1 COWS:     Treatment Plan Summary: Daily contact with patient to assess and evaluate symptoms and progress in treatment Medication management  Plan: 1. Continue patient on 1:1 to help enforce fluid restriction. 2. Recommend elevation of feet to help with fluid retention in lower extremities. 3. Continue current meds as written. 4. Reviewed results of PT consult and doppler studies. 5. Did discuss sleep medication with Tx  Team this AM including MD. Will add Rozerum for sleep. 6. Did discuss with Dolphus his need for ALF upon discharge due to his health problems and he accepted this very well. 7. Patient has been accepted to [facility]. Paper work is being completed and discharge summary written.  Medical Decision Making Problem Points:  Established problem, stable/improving (1), Established problem, worsening (2), New problem, with additional work-up planned (4) and New problem, with no additional work-up planned (3) Data Points:  Discuss tests with performing physician (1) Review or order clinical lab tests (1) Review of medication regiment & side effects (2)  I certify that inpatient services furnished can reasonably be expected to improve the patient's condition.  [name]. Hyman Crossan [facility] 01/11/2013 4:09 PM

## 2013-01-11 NOTE — Progress Notes (Signed)
1:1 Note D) Pt has been in his room and presently is eating his dinner. Affect and mood are bright. Pt continues grandiose stating he has a lot of money and he can buy anything.  A) Given support and allowing Pt to verbalize his grandiosity. Frequent contact and appropriate praise given. R) Rates his depression and hopelessness at a 0 but reports that he has constant thoughts of SI on his personal inventory.

## 2013-01-11 NOTE — Tx Team (Signed)
Interdisciplinary Treatment Plan Update  Date Reviewed:01/11/2013  Time Reviewed: 10:45 AM   Progress in Treatment:  Attending groups: Yes  Participating in groups:Yes  Taking medication as prescribed: Yes Tolerating medication: Yes  Family/Significant other contact made: Yes  Patient understands diagnosis: No. Demonstrates minimal insight  Discussing patient identified problems/goals with staff: Yes See initial plan  Medical problems stabilized or resolved: Yes  Denies suicidal/homicidal ideation: Yes In tx team  Patient has not harmed self or others: Yes  For review of initial/current patient goals, please see plan of care.  Estimated Length of Stay: 4-5 days  Reason for Continuation of Hospitalization:  Delusions Medication stabilization R/O Dementia  New Problems/Goals identified: N/A  Discharge Plan or Barriers: unclear at this point. Likely not able to care for self at home. [facility] ACT assessed pt on 4/24 for services. Also, doctor suggested referral to [facility]. Referral made to [facility] per family request. Pt progress evaluated on day to day basis.  Additional Comments: Satya continues to be delusional with grandiose thoughts. Patient still on 1:1 for medical reasons. Reported increase in sleep last night at about 4 hours. Attendees:  Signature: Thedore Mins, MD  01/11/2013 10:47 AM   Signature: Trula Slade, MSW intern 01/11/2013 10:47 AM   Signature: Verne Spurr, PA  01/11/2013 10:47 AM   Signature: Joslyn Devon, RN  01/11/2013 10:47 AM   Signature:    Signature:    Signature:    Signature:    Signature:    Signature:    Signature:    Signature:    Signature:    Scribe for Treatment Team:  Trula Slade, MSW intern, 01/11/2013 10:47 AM

## 2013-01-11 NOTE — Discharge Summary (Deleted)
Physician Discharge Summary Note  Patient:  Albert Hess is an 67 y.o., male MRN:  [id number] DOB:  1946/09/10 Patient phone:  [phone] (home)  Patient address:   [address]    [place] [facility] [address],   Date of Admission:  12/25/2012 Date of Discharge: 01/21/2013  Reason for Admission:  Psychosis  Discharge Diagnoses: Principal Problem:   Bipolar I disorder, most recent episode (or current) manic Active Problems:   Hyponatremia   Diabetes   Psychogenic polydipsia   Ulcer of foot  Review of Systems  Unable to perform ROS: mental acuity  Constitutional: Negative.  Negative for fever, chills, weight loss, malaise/fatigue and diaphoresis.  HENT: Negative for congestion and sore throat.   Eyes: Negative for blurred vision, double vision and photophobia.  Respiratory: Negative for cough, shortness of breath and wheezing.   Cardiovascular: Negative for chest pain, palpitations and PND.  Gastrointestinal: Negative for heartburn, nausea, vomiting, abdominal pain, diarrhea and constipation.  Musculoskeletal: Negative for myalgias, joint pain and falls.  Neurological: Negative for dizziness, tingling, tremors, sensory change, speech change, focal weakness, seizures, loss of consciousness, weakness and headaches.  Endo/Heme/Allergies: Negative for polydipsia. Does not bruise/bleed easily.  Psychiatric/Behavioral: Negative for depression, suicidal ideas, hallucinations, memory loss and substance abuse. The patient is not nervous/anxious and does not have insomnia.    Axis Diagnosis:   AXIS I:  Bipolar disorder most recent episode manic, Dementia, Cannabis abuse AXIS II:  Deferred AXIS III:   Past Medical History  Diagnosis Date  . Mental disorder   . PTSD (post-traumatic stress disorder)   . Bipolar 2 disorder   . Schizophrenia   . Hypertension   . High cholesterol   . Diabetes mellitus without complication    AXIS IV:  other psychosocial or environmental problems,  problems related to social environment, problems with access to health Hess services and problems with primary support group AXIS V:  21-30 behavior considerably influenced by delusions or hallucinations OR serious impairment in judgment, communication OR inability to function in almost all areas  Hess Course:  The patient is a 67 year old male [facility] veteran with an honorable discharge who presented to the ED with a history of PTSD, Bipolar disorder, and Schizophrenia with symptoms of mood swings, lability, impulsiveness, psychosis, suicidal ideation with no plan, hypervigilent, flashbacks, grandiose delusions, stating that he "owned most of Albert Hess and had plans to change the skyline soon."       Other symptoms included severe hyponatremia for which he was placed in patient on the medical floor for stabilization. His UDS was positive for cannabis. Upon medical stabilization Albert Hess was then transferred to [facility] for further Hess and treatment.       He continued to have grandiose affect, flight of ideas, impulsivity, mood lability, delusions, hallucinations auditory type, and paranoia.  He was flirtatious with the male staff. He was focused on Albert Hess, Albert Hess and getting his Albert Hess benefits.  Albert Hess was cooperative and redirectable, but was intrusive with poor boundaries.  He was started on Risperdal and titrated upward to 2mg  po BID. Titration was slow due to his hyponatremia and psychogenic polydipsia.  His sleep was poor but would improve as the medication was titrated upward. Sleep medication was trazodone and changed from prn to scheduled.  His sleep pattern would improve after 1 day of medication increase, with small cognitive improvements that would decline the next day.        Albert Hess has a history of diabetes  and a wound Hess consult was done due to his feet which had severe callus formation on both feet and some soft tissue necrosis on one toe. He again became hyponatremic and was put on a  fluid restriction protocol with Albert Hess which required a 1:1 to supervise.  He also developed severe edema in both lower extremities also followed by Albert Hess.       Albert Hess's symptoms of mania improved steadily but he continued to remain disorganized and confused with cognition only slightly improved related to sleep.  The concern for dementia vs delirium was raised and further diagnostic studies were done including MRI of the brain. No acute changes were noted on the MRI. It was felt that he did have symptoms of dementia rather than delirium.      His symptoms continued to wax and wane, and it was felt by both the Albert Hess team and Psychiatry that he would need either home health RN upon returning home or an assisted living facility due to his poor mental acuity and his chronic health problems.  His family requested that he be placed on the [facility] list as he had been a patient there in the past. The paper work was completed as well as a Manufacturing systems engineer number requested as the treatment team felt that he would likely be able to be placed in an ALF before a bed would be available at [facility].      While waiting for placement the problem with sleep continued and a second antipsychotic was initiated to see if sleep could be corrected. He was started on Serouqel 25mg  at hs with good results. The plan was to cross titrate Albert Hess upward on the Seroquel at bedtime while titrating downward on the Risperdal being mindful of serum sodium levels and blood sugars.        Albert Hess was accepted to an ALF [facility] in [facility] and is ready to be discharged there.  All felt that he has been at his baseline for sometime and agreed that he is stable for discharge.  Consults:  Albert Hess                    Wound Hess Nurse  Significant Diagnostic Studies: CMP, CBC-diff, UDS, UA, CBGs                                                        Bilateral Venous dopplers of lowe extremities                                                         MRI of brain  Discharge Vitals:   Blood pressure 100/57, pulse 88, temperature 97.6 F (36.4 C), temperature source Oral, resp. rate 16, height 5' 10.25" (1.784 m), weight 98.884 kg (218 lb). Body mass index is 31.07 kg/(m^2). Lab Results:   Results for orders placed during the Hess encounter of 12/25/12 (from the past 72 hour(s))  GLUCOSE, CAPILLARY     Status: None   Collection Time    01/08/13  5:03 PM      Result Value Range   Glucose-Capillary 91  70 -  99 mg/dL  GLUCOSE, CAPILLARY     Status: Abnormal   Collection Time    01/09/13  6:02 AM      Result Value Range   Glucose-Capillary 111 (*) 70 - 99 mg/dL  GLUCOSE, CAPILLARY     Status: None   Collection Time    01/09/13 11:44 AM      Result Value Range   Glucose-Capillary 94  70 - 99 mg/dL  GLUCOSE, CAPILLARY     Status: Abnormal   Collection Time    01/09/13  4:56 PM      Result Value Range   Glucose-Capillary 100 (*) 70 - 99 mg/dL  GLUCOSE, CAPILLARY     Status: None   Collection Time    01/10/13  6:12 AM      Result Value Range   Glucose-Capillary 98  70 - 99 mg/dL  GLUCOSE, CAPILLARY     Status: None   Collection Time    01/10/13 12:00 PM      Result Value Range   Glucose-Capillary 79  70 - 99 mg/dL  GLUCOSE, CAPILLARY     Status: Abnormal   Collection Time    01/10/13  5:12 PM      Result Value Range   Glucose-Capillary 125 (*) 70 - 99 mg/dL  COMPREHENSIVE METABOLIC PANEL     Status: Abnormal   Collection Time    01/10/13  7:42 PM      Result Value Range   Sodium 140  135 - 145 mEq/L   Potassium 4.7  3.5 - 5.1 mEq/L   Chloride 103  96 - 112 mEq/L   CO2 29  19 - 32 mEq/L   Glucose, Bld 90  70 - 99 mg/dL   BUN 20  6 - 23 mg/dL   Creatinine, Ser 1.61  0.50 - 1.35 mg/dL   Calcium 9.7  8.4 - 09.6 mg/dL   Total Protein 6.6  6.0 - 8.3 g/dL   Albumin 3.5  3.5 - 5.2 g/dL   AST 17  0 - 37 U/L   ALT 14  0 - 53 U/L   Alkaline Phosphatase 67  39 - 117 U/L   Total Bilirubin 0.2  (*) 0.3 - 1.2 mg/dL   GFR calc non Af Amer 86 (*) >90 mL/min   GFR calc Af Amer >90  >90 mL/min   Comment:            The eGFR has been calculated     using the CKD EPI equation.     This calculation has not been     validated in all clinical     situations.     eGFR's persistently     <90 mL/min signify     possible Chronic Kidney Disease.  GLUCOSE, CAPILLARY     Status: Abnormal   Collection Time    01/10/13  9:06 PM      Result Value Range   Glucose-Capillary 110 (*) 70 - 99 mg/dL   Comment 1 Notify RN    GLUCOSE, CAPILLARY     Status: Abnormal   Collection Time    01/11/13  6:43 AM      Result Value Range   Glucose-Capillary 102 (*) 70 - 99 mg/dL  GLUCOSE, CAPILLARY     Status: Abnormal   Collection Time    01/11/13 11:26 AM      Result Value Range   Glucose-Capillary 129 (*) 70 - 99 mg/dL    Physical Findings: AIMS: Facial  and Oral Movements Muscles of Facial Expression: None, normal Lips and Perioral Area: None, normal Jaw: None, normal Tongue: None, normal,Extremity Movements Upper (arms, wrists, hands, fingers): None, normal Lower (legs, knees, ankles, toes): None, normal, Trunk Movements Neck, shoulders, hips: None, normal, Overall Severity Severity of abnormal movements (highest score from questions above): None, normal Incapacitation due to abnormal movements: None, normal Patient's awareness of abnormal movements (rate only patient's report): No Awareness, Dental Status Current problems with teeth and/or dentures?: No Does patient usually wear dentures?: No  CIWA:  CIWA-Ar Total: 1 COWS:     Psychiatric Specialty Exam: See Psychiatric Specialty Exam and Suicide Risk Assessment completed by Attending Physician prior to discharge.  Discharge destination:  [facility]  Is patient on multiple antipsychotic therapies at discharge:  Yes,   Do you recommend tapering to monotherapy for antipsychotics? Yes  Has Patient had three or more failed trials of  antipsychotic monotherapy by history:  No  Recommended Plan for Multiple Antipsychotic Therapies: Patient's medications are in the process of a cross-taper;  medications include:  including Risperdal with Seroquel. We are recommending that the Risperdal be slowly discontinued by the out patient provider over time to avoid hypernatremia.     Medication List    ASK your doctor about these medications     Indication   lisinopril 10 MG tablet  Commonly known as:  PRINIVIL,ZESTRIL  Take 1 tablet (10 mg total) by mouth daily.      pravastatin 40 MG tablet  Commonly known as:  PRAVACHOL  Take 40 mg by mouth daily.      risperiDONE 0.5 MG tablet  Commonly known as:  RISPERDAL  Take 3 tablets (1.5 mg total) by mouth daily.          Follow-up recommendations:  Activities: Resume activity as tolerated. Diet: Heart healthy low sodium diet Tests: Follow up testing will be determined by your out patient provider.  Comments:    Total Discharge Time:  Greater than 30 minutes.  Signed: [name]. Abhiraj Dozal [facility] 01/11/2013 5:33 PM

## 2013-01-11 NOTE — Progress Notes (Signed)
D) Pt has been up and walking around with assistance from his 1:1. Has been wanting to drink frequently. Will joke around but it appears more of a way to cover up confusion. Has attended some of the groups. Wants to drink more than alloted amount of fluids A) Given support and reason for the fluid restriction explained to Pt. Encouraged to attend the groups. R) Pt denies SI and HI.

## 2013-01-11 NOTE — B[facility] Group Notes (Signed)
[facility] LCSW Aftercare Discharge Planning Group Note   01/11/2013 10:41 AM  Participation Quality:  Appropriate   Mood/Affect:  Appropriate  Depression Rating:  None   Anxiety Rating:  None   Thoughts of Suicide:  No Will you contract for safety?   NA  Current AVH:  No  Plan for Discharge/Comments:  Pt seen by [facility] yesterday for assessment for services. Chike reported that the meeting went well. Pt still demonstrating limited insight and delusional thinking. He stated that he can leave hospital at any time and is still convinced that he is artist that painted all the artwork in [facility]. Pt pleasant demeanor throughout group. Doctor suggested referral to [facility]. Pt referral made to [facility] per family wishes. Pt progress evaluated day to day.   Transportation Means: son/family  Supports: Chiropractor, [employer]

## 2013-01-11 NOTE — Progress Notes (Signed)
Patient has been accepted to [facility] with bed available on Saturday. LCSW has completed IVC paperwork and faxed to the magistrate. Awaiting for papers to be served. [facility] has been faxed requested information as well will all updated information. Sheriff has been notified of transport for tomorrow, however RN or SW or AC needs to call for pick up : [phone].  Packet has been made for patient with all information.  Phillips Hay has been made aware of weekend DC and will finish DC on Saturday.  Patient must leave before 7pm on Saturday.  Andres Shad, MSW Clinical Lead [phone]

## 2013-01-11 NOTE — BHH Group Notes (Signed)
[facility] LCSW Group Therapy  01/11/2013 2:27 PM  Type of Therapy:  Group Therapy  Participation Level:  Active  Participation Quality:  Drowsy  Affect:  Appropriate  Cognitive:  Disorganized  Insight:  None  Engagement in Group:  Engaged  Engagement in Therapy:  Engaged  Modes of Intervention:  Discussion, Education, Exploration, Socialization and Support  Summary of Progress/Problems: RELAPSE: The topic for today was feelings about relapse. Pt discussed what relapse prevention is to them and identified triggers that they are on the path to relapse. Pt processed their feeling towards relapse and was able to relate to peers. Pt discussed coping skills that can be used for relapse prevention. [facility] contributed to group conversation about relapse and identified his family as supportive people that help him when he has problems. 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 Albert Hess still delusional and disorganized in thinking. He presents with pleasant mood but appeared lethargic and drowsy throughout group.  :    Albert Hess, [employer] 01/11/2013, 2:27 PM

## 2013-01-11 NOTE — Progress Notes (Signed)
1:1 Note D) Pt has been attending the program and interacting with select peers. Is pleasant but also grandiose in his thinking. Continues to have the problem with fluid retention and is being maintained on a fluid restriction of 1200 cc's a day.  A) Frequent contact given to Pt. Given support and reassurance. Redirected gentlly. Remains on a 1:1. R) Remains safe.

## 2013-01-11 NOTE — Progress Notes (Signed)
Adult Psychoeducational Group Note  Date:  01/11/2013 Time:  12:07 PM  Group Topic/Focus:  Relapse Prevention Planning:   The focus of this group is to define relapse and discuss the need for planning to combat relapse.  Participation Level:  Did Not Attend  Participation Quality:    Affect:    Cognitive:    Insight:   Engagement in Group:    Modes of Intervention:    Additional Comments:  Pt was in the room, pt had permission to not stay in the room.  Isla Pence M 01/11/2013, 12:07 PM

## 2013-01-11 NOTE — Progress Notes (Signed)
Patient ID: Albert Hess, male   DOB: 04-Oct-1945, 67 y.o.   MRN: [id number]  Nursing 1:1 note D:Pt observed sitting in bed . RR even and unlabored. No distress noted.Pt continues to be delusional, but pt appears calm upon approach.   A: 1:1 observation continues for safety. Pt given medications R: pt remains safe. Pt taking medications.

## 2013-01-12 LAB — GLUCOSE, CAPILLARY
Glucose-Capillary: 104 mg/dL — ABNORMAL HIGH (ref 70–99)
Glucose-Capillary: 122 mg/dL — ABNORMAL HIGH (ref 70–99)
Glucose-Capillary: 115 mg/dL — ABNORMAL HIGH (ref 70–99)

## 2013-01-12 NOTE — Progress Notes (Signed)
The focus of this group is to help patients review their daily goal of treatment and discuss progress on daily workbooks. Pt attended the evening group session and responded to all discussion prompts. Pt said that he had a very good day on account of the care he received on day shift, which he reported as "first rate." Pt reported that since "the previous nurse took good care of my feet," he had no further needs from Nursing Staff this evening. Pt was bright and jovial in group and remained on topic for each prompt. Pt appeared engaged when listening to his peers speak and offered supportive comments to them.

## 2013-01-12 NOTE — BHH Group Notes (Signed)
[facility] LCSW Group Therapy  01/12/2013  11:15-11:45AM  Summary of Progress/Problems:  The main focus of today's process group was for the patients to identify ways in which they have in the past sabotaged their own recovery. Motivational Interviewing was utilized to ask the group members what they get out of these actions and how to do something different when discharged from the hospital.  The patient expressed that he self-sabotages by falling through the cracks, then went on to complain loudly and adamantly that he has been in this hospital 26 weeks exactly, and that he fell through the cracks by not being released the first day.  When CSW told him the date he was admitted and compared this to today's date, he was adamant that he has been here 26 days.  He first said he has a Billion dollars in the bank, then switched and said he has a Trillion dollars in the bank and already arranged a nurse to take care of him at home.  Type of Therapy:  Group Therapy  Participation Level:  Active  Participation Quality:  Appropriate, Attentive  Affect:  Blunted and Depressed  Cognitive:  Delusional  Insight:  None  Engagement in Therapy:  Engaged  Modes of Intervention:  Discussion, Exploration and Motivational Interviewing  Sarina Ser 01/12/2013, 1:29 PM

## 2013-01-12 NOTE — Progress Notes (Addendum)
Patient ID: Albert Hess, male   DOB: 1946-09-02, 67 y.o.   MRN: [id number]  Nursing 1:1 note D:Pt observed sleeping in bed with eyes closed. RR even and unlabored. No distress noted A: 1:1 observation continues for safety  R: pt remains safe

## 2013-01-12 NOTE — BHH Group Notes (Addendum)
   Albert Hess 01/12/2013, 1:35 PM

## 2013-01-12 NOTE — Progress Notes (Signed)
See 1:1 notes on MHT flowsheet 

## 2013-01-12 NOTE — Progress Notes (Signed)
Patient ID: Albert Hess, male   DOB: 1945-09-27, 67 y.o.   MRN: [id number] 01-12-13 @ 1400 D: spoke with MD who stated this pt will not go to [facility] today. He stated the facility decided not to take this patient due to his IVC being on and off. A: MD advised the pt he would not discharge to [facility] today. R: he responded by telling the MD that  he had " a trillion dollars". Staff has calculated his intake and at 1200 noon it was 600 cc. His output has been 850 cc. R: he denies any sii/av. The 1:1 continues.

## 2013-01-12 NOTE — Progress Notes (Signed)
Patient ID: Albert Hess, male   DOB: Jun 04, 1946, 67 y.o.   MRN: [id number] 01-12-13 @ 1000 nursing 1:1 note: D: pt has not has any complaints other than his dry eyes. He has no complaints of pain and took all of his am medications. He came to groups. He remains grandiose,  but has been no problem today. A: RN probed to see if he had any needs and he stated "no. He did ask for eye drops prn. R: he denies any si/hi/av. On his inventory sheet he wrote his appetite is improving, energy is hyper, attention improving with "0" depression and "0" hopelessness.

## 2013-01-12 NOTE — Progress Notes (Signed)
Patient ID: Albert Hess, male   DOB: 07-15-1946, 67 y.o.   MRN: [id number] Psychoeducational Group Note  Date:  01/12/2013 Time:1000am  Group Topic/Focus:  Identifying Needs:   The focus of this group is to help patients identify their personal needs that have been historically problematic and identify healthy behaviors to address their needs.  Participation Level:  Active  Participation Quality:  Appropriate  Affect:  Resistant  Cognitive:  Delusional  Insight:  Lacking  Engagement in Group:  Lacking  Additional Comments:  Inventory group and healthy coping skills group   [name] 01/12/2013,10:33 AM

## 2013-01-12 NOTE — Progress Notes (Addendum)
[facility] MD Progress Note  01/12/2013 11:54 AM Albert Hess  MRN:  [id number] Subjective:  Albert Hess is a 67 y/o male with past psychiatric history significant. He believes he has been here in the hospital for 26 weeks. He states his mood is good.  He reports he is taking his medications and denies any side effects.  He states he is a trillionaire and could help pay off the national debt. He believes if he is not discharged today that he will get a billion dollars for his alleged exposure to agent orange.   Diagnosis:  Axis I: Bipolar I disorder, most recent episode (or current) manic  ADL's:  Intact  Sleep: Poor  Appetite:  Good  Suicidal Ideation:  Plan:  Patient denies Intent:  Patient denies Means:  Patient denies Homicidal Ideation:  Plan:  Patient denies Intent:  Patient denies Means:  Patient denies AEB (as evidenced by):  Psychiatric Specialty Exam: Review of Systems  Constitutional: Negative for fever, chills, weight loss and diaphoresis.  Cardiovascular: Negative for chest pain and palpitations.  Genitourinary: Negative for dysuria, urgency and frequency.  Musculoskeletal:       Patient denies gait problems or muscle weakness.  Neurological: Negative for dizziness, tingling, tremors, sensory change, speech change and focal weakness.  Physical Findings: 1+ pitting edema to both lower extremities, skin is cracking.   Blood pressure 110/75, pulse 73, temperature 98.3 F (36.8 C), temperature source Oral, resp. rate 16, height 5' 10.25" (1.784 m), weight 98.884 kg (218 lb).Body mass index is 31.07 kg/(m^2).  General Appearance: Casual and Fairly Groomed  Patent attorney::  Good  Speech:  Clear and Coherent and Normal Rate  Volume:  Increased  Mood:  "Wonderful"  Affect:  Congruent  Thought Process:  Disorganized  Orientation:  Full (Time, Place, and Person)  Thought Content:  Delusions and Rumination  Suicidal Thoughts:  No  Homicidal Thoughts:  No  Memory:  Immediate;    Good Recent;   Fair Remote;   Poor  Judgement:  Impaired  Insight:  Lacking  Psychomotor Activity:  Normal  Concentration:  Fair  Recall:  Poor  Akathisia:  Negative  Handed:  Right  AIMS (if indicated):   As noted in chart.  Assets:  Communication Skills  Sleep:  Number of Hours: 4   Current Medications: Current Facility-Administered Medications  Medication Dose Route Frequency Provider Last Rate Last Dose  . acetaminophen (TYLENOL) tablet 650 mg  650 mg Oral Q6H PRN Kerry Hough, PA-C   650 mg at 01/10/13 0517  . alum & mag hydroxide-simeth (MAALOX/MYLANTA) 200-200-20 MG/5ML suspension 30 mL  30 mL Oral Q4H PRN Kerry Hough, PA-C   30 mL at 12/29/12 0132  . furosemide (LASIX) tablet 20 mg  20 mg Oral Daily Kathlen Mody, MD   20 mg at 01/12/13 0754  . hydrOXYzine (ATARAX/VISTARIL) tablet 25 mg  25 mg Oral Q6H PRN Sanjuana Kava, NP   25 mg at 01/10/13 2008  . lisinopril (PRINIVIL,ZESTRIL) tablet 10 mg  10 mg Oral Daily Kerry Hough, PA-C   10 mg at 01/12/13 0755  . magnesium hydroxide (MILK OF MAGNESIA) suspension 30 mL  30 mL Oral Daily PRN Kerry Hough, PA-C      . polyvinyl alcohol (LIQUIFILM TEARS) 1.4 % ophthalmic solution 1 drop  1 drop Both Eyes PRN Thermon Leyland, NP   1 drop at 01/12/13 0757  . QUEtiapine (SEROQUEL) tablet 25 mg  25 mg Oral QHS  Verne Spurr, PA-C   25 mg at 01/11/13 2138  . ramelteon (ROZEREM) tablet 8 mg  8 mg Oral QHS Verne Spurr, PA-C   8 mg at 01/11/13 2139  . risperiDONE (RISPERDAL) tablet 1 mg  1 mg Oral Daily Verne Spurr, PA-C   1 mg at 01/12/13 0755  . risperiDONE (RISPERDAL) tablet 3 mg  3 mg Oral QHS Verne Spurr, PA-C   3 mg at 01/11/13 2139  . simvastatin (ZOCOR) tablet 5 mg  5 mg Oral q1800 Kerry Hough, PA-C   5 mg at 01/11/13 1846  . traZODone (DESYREL) tablet 200 mg  200 mg Oral QHS Verne Spurr, PA-C   200 mg at 01/11/13 2138  . vitamin B-12 (CYANOCOBALAMIN) tablet 100 mcg  100 mcg Oral Daily Kathlen Mody, MD   100 mcg at  01/12/13 0757  . white petrolatum (VASELINE) gel   Topical Daily Mojeed Akintayo        Lab Results:  Results for orders placed during the hospital encounter of 12/25/12 (from the past 48 hour(s))  GLUCOSE, CAPILLARY     Status: None   Collection Time    01/10/13 12:00 PM      Result Value Range   Glucose-Capillary 79  70 - 99 mg/dL  GLUCOSE, CAPILLARY     Status: Abnormal   Collection Time    01/10/13  5:12 PM      Result Value Range   Glucose-Capillary 125 (*) 70 - 99 mg/dL  COMPREHENSIVE METABOLIC PANEL     Status: Abnormal   Collection Time    01/10/13  7:42 PM      Result Value Range   Sodium 140  135 - 145 mEq/L   Potassium 4.7  3.5 - 5.1 mEq/L   Chloride 103  96 - 112 mEq/L   CO2 29  19 - 32 mEq/L   Glucose, Bld 90  70 - 99 mg/dL   BUN 20  6 - 23 mg/dL   Creatinine, Ser 1.61  0.50 - 1.35 mg/dL   Calcium 9.7  8.4 - 09.6 mg/dL   Total Protein 6.6  6.0 - 8.3 g/dL   Albumin 3.5  3.5 - 5.2 g/dL   AST 17  0 - 37 U/L   ALT 14  0 - 53 U/L   Alkaline Phosphatase 67  39 - 117 U/L   Total Bilirubin 0.2 (*) 0.3 - 1.2 mg/dL   GFR calc non Af Amer 86 (*) >90 mL/min   GFR calc Af Amer >90  >90 mL/min   Comment:            The eGFR has been calculated     using the CKD EPI equation.     This calculation has not been     validated in all clinical     situations.     eGFR's persistently     <90 mL/min signify     possible Chronic Kidney Disease.  GLUCOSE, CAPILLARY     Status: Abnormal   Collection Time    01/10/13  9:06 PM      Result Value Range   Glucose-Capillary 110 (*) 70 - 99 mg/dL   Comment 1 Notify RN    GLUCOSE, CAPILLARY     Status: Abnormal   Collection Time    01/11/13  6:43 AM      Result Value Range   Glucose-Capillary 102 (*) 70 - 99 mg/dL  GLUCOSE, CAPILLARY     Status: Abnormal  Collection Time    01/11/13 11:26 AM      Result Value Range   Glucose-Capillary 129 (*) 70 - 99 mg/dL  GLUCOSE, CAPILLARY     Status: Abnormal   Collection Time     01/11/13  5:07 PM      Result Value Range   Glucose-Capillary 115 (*) 70 - 99 mg/dL   Comment 1 Notify RN    GLUCOSE, CAPILLARY     Status: Abnormal   Collection Time    01/12/13  6:03 AM      Result Value Range   Glucose-Capillary 104 (*) 70 - 99 mg/dL  GLUCOSE, CAPILLARY     Status: Abnormal   Collection Time    01/12/13 11:46 AM      Result Value Range   Glucose-Capillary 114 (*) 70 - 99 mg/dL   Comment 1 Documented in Chart     Comment 2 Notify RN      Physical Findings: AIMS: Facial and Oral Movements Muscles of Facial Expression: None, normal Lips and Perioral Area: None, normal Jaw: None, normal Tongue: None, normal,Extremity Movements Upper (arms, wrists, hands, fingers): None, normal Lower (legs, knees, ankles, toes): None, normal, Trunk Movements Neck, shoulders, hips: None, normal, Overall Severity Severity of abnormal movements (highest score from questions above): None, normal Incapacitation due to abnormal movements: None, normal Patient's awareness of abnormal movements (rate only patient's report): No Awareness, Dental Status Current problems with teeth and/or dentures?: No Does patient usually wear dentures?: No  CIWA:  CIWA-Ar Total: 1 COWS:     Treatment Plan Summary: Daily contact with patient to assess and evaluate symptoms and progress in treatment Medication management  Plan: Continue current medications.   I have reassessed patient and believe he continues to meet criteria for involuntary inpatient admission and completed a second certificate. As he has not been accepted at [facility] today, will continue inpatient treatment at [facility].  Medical Decision Making Problem Points:  Established problem, stable/improving (1), Review of last therapy session (1) and Review of psycho-social stressors (1) Data Points:  Order Aims Assessment (2) Review of medication regiment & side effects (2)  I certify that inpatient services furnished can reasonably be expected  to improve the patient's condition.   Laquana Villari 01/12/2013, 11:54 AM
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
## 2013-01-12 NOTE — Progress Notes (Signed)
Patient ID: Albert Hess, male   DOB: 14-Jan-1946, 67 y.o.   MRN: [id number] 01-12-13 @ 1800 nursing 1:1 note: D: obtained pt's wt per order. Also did wound care and documented the care per the order. A: cleansed the wound, applied ointment, and clean socks on the rt  foot. Pt did not complain of any pain. Also spoke with the pt's son ([name]) who had concerns about the pt's discharge. A: advised him that the transfer to [facility] will not transpire and that other arrangements will need to be made. Those arrangements are incomplete at this time. R: he was glad to hear that other arrangements will be made for his father. He stated he could not care for his father.  The 1:1 continues.

## 2013-01-12 NOTE — Clinical Social Work Note (Signed)
Clinical Social Worker has attempted several times to call patient's son to let him know of cancellation of transfer to [facility], as well as reasons why (lack of continuity of legal paperwork).  Patient did not answer phone, and there was not a mechanism by which to leave a message.  Ambrose Mantle, LCSW 01/12/2013, 1:34 PM

## 2013-01-13 LAB — GLUCOSE, CAPILLARY: Glucose-Capillary: 123 mg/dL — ABNORMAL HIGH (ref 70–99)

## 2013-01-13 NOTE — Progress Notes (Signed)
Patient ID: Albert Hess, male   DOB: Mar 28, 1946, 67 y.o.   MRN: [id number] 01-13-13 @ 1400 nursing 1:1 note: D: pt had to be reminded of his fluid restriction. As of 1347 his intake was at 720 cc with an output of 1039 cc. He stated he had x1 b/m and he ate all of his lunch. His wound care has been completed. A: pt stated he understood his fluid restriction is at 1200 cc. He tolerated his wound care. Ask if he had any other needs, he stated "no". R: he remains grandiose yet pleasant. The 1:1 continues.

## 2013-01-13 NOTE — Progress Notes (Signed)
[facility] MD Progress Note  01/13/2013 5:16 PM Albert Hess  MRN:  [id number] Subjective:  Albert Hess is a 67 y/o male with past psychiatric history significant. He believes he has been here in the hospital for 27 weeks "ONe year and one week."  He reports he is taking his medications and denies any side effects.  He states he is a trillionaire and could help pay off the national debt. He still believes if he is not discharged today that he will get a billion dollars for his alleged exposure to agent orange. He reports that trazodone is helps him sleep.   Diagnosis:  Axis I: Bipolar I disorder, most recent episode (or current) manic  ADL's:  Intact  Sleep: Poor  Appetite:  Good  Suicidal Ideation:  Plan:  Patient denies Intent:  Patient denies Means:  Patient denies Homicidal Ideation:  Plan:  Patient denies Intent:  Patient denies Means:  Patient denies AEB (as evidenced by):  Psychiatric Specialty Exam: Review of Systems  Constitutional: Negative for fever, chills, weight loss and diaphoresis.  Cardiovascular: Negative for chest pain and palpitations.  Genitourinary: Negative for dysuria, urgency and frequency.  Musculoskeletal:       Patient denies gait problems or muscle weakness.  Neurological: Negative for dizziness, tingling, tremors, sensory change, speech change and focal weakness.  Physical Findings: 1+ pitting edema to both lower extremities, skin is cracking.   Blood pressure 112/70, pulse 101, temperature 97.6 F (36.4 C), temperature source Oral, resp. rate 14, height 5' 10.25" (1.784 m), weight 99.338 kg (219 lb).Body mass index is 31.21 kg/(m^2).  General Appearance: Casual and Fairly Groomed  Patent attorney::  Good  Speech:  Clear and Coherent and Normal Rate  Volume:  Increased  Mood:  "excellent"  Affect:  Congruent  Thought Process:  Disorganized  Orientation:  Full (Time, Place, and Person)  Thought Content:  Delusions and Rumination  Suicidal Thoughts:  No   Homicidal Thoughts:  No  Memory:  Immediate;   Good Recent;   Fair Remote;   Poor  Judgement:  Impaired  Insight:  Lacking  Psychomotor Activity:  Normal  Concentration:  Fair  Recall:  Poor  Akathisia:  Negative  Handed:  Right  AIMS (if indicated):   As noted in chart.  Assets:  Communication Skills  Sleep:  Number of Hours: 6.75   Current Medications: Current Facility-Administered Medications  Medication Dose Route Frequency Provider Last Rate Last Dose  . acetaminophen (TYLENOL) tablet 650 mg  650 mg Oral Q6H PRN Kerry Hough, PA-C   650 mg at 01/10/13 0517  . alum & mag hydroxide-simeth (MAALOX/MYLANTA) 200-200-20 MG/5ML suspension 30 mL  30 mL Oral Q4H PRN Kerry Hough, PA-C   30 mL at 12/29/12 0132  . furosemide (LASIX) tablet 20 mg  20 mg Oral Daily Kathlen Mody, MD   20 mg at 01/13/13 0735  . hydrOXYzine (ATARAX/VISTARIL) tablet 25 mg  25 mg Oral Q6H PRN Sanjuana Kava, NP   25 mg at 01/10/13 2008  . lisinopril (PRINIVIL,ZESTRIL) tablet 10 mg  10 mg Oral Daily Kerry Hough, PA-C   10 mg at 01/13/13 0735  . magnesium hydroxide (MILK OF MAGNESIA) suspension 30 mL  30 mL Oral Daily PRN Kerry Hough, PA-C      . polyvinyl alcohol (LIQUIFILM TEARS) 1.4 % ophthalmic solution 1 drop  1 drop Both Eyes PRN Thermon Leyland, NP   1 drop at 01/13/13 0738  . QUEtiapine (SEROQUEL)  tablet 25 mg  25 mg Oral QHS Verne Spurr, PA-C   25 mg at 01/12/13 2106  . ramelteon (ROZEREM) tablet 8 mg  8 mg Oral QHS Verne Spurr, PA-C   8 mg at 01/12/13 2106  . risperiDONE (RISPERDAL) tablet 1 mg  1 mg Oral Daily Verne Spurr, PA-C   1 mg at 01/13/13 0735  . risperiDONE (RISPERDAL) tablet 3 mg  3 mg Oral QHS Verne Spurr, PA-C   3 mg at 01/12/13 2106  . simvastatin (ZOCOR) tablet 5 mg  5 mg Oral q1800 Kerry Hough, PA-C   5 mg at 01/13/13 1628  . traZODone (DESYREL) tablet 200 mg  200 mg Oral QHS Verne Spurr, PA-C   200 mg at 01/12/13 2106  . vitamin B-12 (CYANOCOBALAMIN) tablet 100  mcg  100 mcg Oral Daily Kathlen Mody, MD   100 mcg at 01/13/13 0735  . white petrolatum (VASELINE) gel   Topical Daily Mojeed Akintayo        Lab Results:  Results for orders placed during the hospital encounter of 12/25/12 (from the past 48 hour(s))  GLUCOSE, CAPILLARY     Status: Abnormal   Collection Time    01/12/13  6:03 AM      Result Value Range   Glucose-Capillary 104 (*) 70 - 99 mg/dL  GLUCOSE, CAPILLARY     Status: Abnormal   Collection Time    01/12/13 11:46 AM      Result Value Range   Glucose-Capillary 114 (*) 70 - 99 mg/dL   Comment 1 Documented in Chart     Comment 2 Notify RN    GLUCOSE, CAPILLARY     Status: Abnormal   Collection Time    01/12/13  4:27 PM      Result Value Range   Glucose-Capillary 122 (*) 70 - 99 mg/dL   Comment 1 Documented in Chart     Comment 2 Notify RN    GLUCOSE, CAPILLARY     Status: Abnormal   Collection Time    01/12/13  8:29 PM      Result Value Range   Glucose-Capillary 115 (*) 70 - 99 mg/dL   Comment 1 Notify RN    GLUCOSE, CAPILLARY     Status: None   Collection Time    01/13/13  6:18 AM      Result Value Range   Glucose-Capillary 99  70 - 99 mg/dL  GLUCOSE, CAPILLARY     Status: Abnormal   Collection Time    01/13/13 12:09 PM      Result Value Range   Glucose-Capillary 115 (*) 70 - 99 mg/dL  GLUCOSE, CAPILLARY     Status: Abnormal   Collection Time    01/13/13  5:09 PM      Result Value Range   Glucose-Capillary 123 (*) 70 - 99 mg/dL   Comment 1 Documented in Chart     Comment 2 Notify RN      Physical Findings: AIMS: Facial and Oral Movements Muscles of Facial Expression: None, normal Lips and Perioral Area: None, normal Jaw: None, normal Tongue: None, normal,Extremity Movements Upper (arms, wrists, hands, fingers): None, normal Lower (legs, knees, ankles, toes): None, normal, Trunk Movements Neck, shoulders, hips: None, normal, Overall Severity Severity of abnormal movements (highest score from questions  above): None, normal Incapacitation due to abnormal movements: None, normal Patient's awareness of abnormal movements (rate only patient's report): No Awareness, Dental Status Current problems with teeth and/or dentures?: No Does patient usually  wear dentures?: No  CIWA:  CIWA-Ar Total: 1 COWS:     Treatment Plan Summary: Daily contact with patient to assess and evaluate symptoms and progress in treatment Medication management  Plan: Continue current medications.   As he has not been accepted at [facility] today, will continue inpatient treatment at [facility]. He is currently under IVC and I had completed the second certificate yesterday. His son called yesterday evening concerned that the patient would be discharged home, stating he could not be safe by himself and that they could not take care of him.  Medical Decision Making Problem Points:  Established problem, stable/improving (1), Review of last therapy session (1) and Review of psycho-social stressors (1) Data Points:  Order Aims Assessment (2) Review of medication regiment & side effects (2)  I certify that inpatient services furnished can reasonably be expected to improve the patient's condition.   Brindle Leyba 01/13/2013, 5:16 PM

## 2013-01-13 NOTE — [facility] Group Notes (Signed)
[facility] Group Notes:  (Clinical Social Work)  01/13/2013   11:15-11:45AM  Summary of Progress/Problems:  The main focus of today's process group was to listen to a variety of genres of music and to identify that different types of music provoke different responses.  The patient then was able to identify personally what was soothing for them, as well as energizing.  Handouts were used to record feelings evoked, as well as how patient can personally use this knowledge in sleep habits, with depression, and with other symptoms.  The patient expressed appropriately how different music made him feel, and kept time to the beats of various pieces with head movements that amused other group members because it made it obvious how involved he was in the music.   Type of Therapy:  Music Therapy   Participation Level:  Active  Participation Quality:  Attentive and Sharing  Affect:  Blunted  Cognitive:  Appropriate  Insight:  Engaged  Engagement in Therapy:  Engaged  Modes of Intervention:   Activity, Exploration  Ambrose Mantle, LCSW 01/13/2013, 12:32 PM

## 2013-01-13 NOTE — Progress Notes (Signed)
Patient ID: Albert Hess, male   DOB: 04-04-1946, 67 y.o.   MRN: [id number] Psychoeducational Group Note  Date:  01/13/2013 Time:  1000am  Group Topic/Focus:  Making Healthy Choices:   The focus of this group is to help patients identify negative/unhealthy choices they were using prior to admission and identify positive/healthier coping strategies to replace them upon discharge.  Participation Level:  Active  Participation Quality:  Appropriate  Affect:  Appropriate  Cognitive:  Appropriate  Insight:  Supportive  Engagement in Group:  Supportive  Additional Comments:  Inventory group and healthy support systems  [name] 01/13/2013,10:25 AM

## 2013-01-13 NOTE — Progress Notes (Addendum)
Patient ID: Albert Hess, male   DOB: 04/16/1946, 67 y.o.   MRN: [id number] 01-13-13 @ 1800 nursing 1:1 note: D: when asked this evening pt stated he feels" great". Fluid restrictions are still in effect. He has taken all scheduled medications and had a good appetite. This pt remains grandiose.  A: calculated his fluid intake at 1080 cc and his output has been 1339 cc. His wound care was completed and charted. He has not had any complaints of pain and has not voiced any needs. R: staff will continue to support and encourage. The 1:1 continues.

## 2013-01-13 NOTE — Progress Notes (Signed)
Patient ID: Albert Hess, male   DOB: 02-Mar-1946, 67 y.o.   MRN: [id number] 01-13-13 @ 1000 nursing 1:1 note: D: pt took all his am medications, has gone to groups, eaten 100% of his meals and has been cautioned by staff to watch his fluids,  due to the fluid restrictions. A: he has been cooperative and stated he would watch his fluid intake as not to exceed 1200 cc. R: on his inventory sheet he wrote appetite is improving, energy is hyper, attention improving with his depression and hopelessness both at "0".  He denies any si/hi/av. The 1:1 continues.

## 2013-01-13 NOTE — Progress Notes (Signed)
Writer has observed patient up in the dayroom earlier, he attended group this evening and writer spoke with him 1:1. Patient is under the impression that he is leaving to go home tonight, Clinical research associate explained to him that he would be here tonight and he reports that each night he has to stay we (the hospital) has to write him a check for a billion dollars. Patient is pleasantly delusional and voiced no complaints. Patient was compliant with his scheduled medications, denies si/hi/a/v hallucinations. Patient is currently lying in bed asleep at 2200 with eyes closed and resp even and unlabored, no distress noted. Patient remains safe and 1:1 continues with mht at bedside.

## 2013-01-14 DIAGNOSIS — R609 Edema, unspecified: Secondary | ICD-10-CM

## 2013-01-14 DIAGNOSIS — R600 Localized edema: Secondary | ICD-10-CM | POA: Diagnosis present

## 2013-01-14 LAB — BASIC METABOLIC PANEL
CO2: 27 mEq/L (ref 19–32)
Calcium: 8.9 mg/dL (ref 8.4–10.5)
Glucose, Bld: 126 mg/dL — ABNORMAL HIGH (ref 70–99)
Sodium: 135 mEq/L (ref 135–145)

## 2013-01-14 LAB — GLUCOSE, CAPILLARY: Glucose-Capillary: 98 mg/dL (ref 70–99)

## 2013-01-14 MED ORDER — QUETIAPINE FUMARATE 50 MG PO TABS
50.0000 mg | ORAL_TABLET | Freq: Every day | ORAL | Status: DC
  Administered 2013-01-14 – 2013-01-20 (×6): 50 mg via ORAL
  Filled 2013-01-14 (×8): qty 1

## 2013-01-14 MED ORDER — FUROSEMIDE 40 MG PO TABS
40.0000 mg | ORAL_TABLET | Freq: Every day | ORAL | Status: DC
  Administered 2013-01-15 – 2013-01-21 (×7): 40 mg via ORAL
  Filled 2013-01-14 (×9): qty 1

## 2013-01-14 MED ORDER — RISPERIDONE 2 MG PO TABS
2.0000 mg | ORAL_TABLET | Freq: Every day | ORAL | Status: DC
  Administered 2013-01-14: 2 mg via ORAL
  Filled 2013-01-14 (×2): qty 1

## 2013-01-14 MED ORDER — METFORMIN HCL 500 MG PO TABS
500.0000 mg | ORAL_TABLET | Freq: Every day | ORAL | Status: DC
  Administered 2013-01-15 – 2013-01-21 (×7): 500 mg via ORAL
  Filled 2013-01-14 (×8): qty 1

## 2013-01-14 NOTE — Progress Notes (Signed)
Observation note: D: Pt in room, sitting in chair w/his feet elevated on the bed. Sitter is present at bedside. Pt presents with delusional thoughts. Per pt, "I was told I'm not leaving today but that's okay because that's a billion dollars you'll owe me each day". Pt then went on to state, "just give me a pill and I will go to sleep". Per pt he has urinated once this morning. Pt denies SI/HI/AVH. Pt denies pain and show no s/s of distress.  Pt ambulates in hallway independently with sitter. Pt gait is steady this am. Pt is pleasant, cooperative and likes to joke around with staff. A: Medications administered as ordered per MD. Pt lower extremities assessed for edema Verbal support given. Pt encouraged to attend groups. 15 minute checks performed for safety. Pt remains on 1:1 observation for fluid restriction until d/c'd. R: Pt remains safe at this time. Pt is compliant with taking meds.

## 2013-01-14 NOTE — Progress Notes (Signed)
Observation note: Pt sitting in chair with his feet elevated on the bed and eyes closed. Sitter presents. Pt do not appear to be in pain or distress. Resp even and unlabored. Pt remains on 1:1 observation for fluid restriction until d/c'd. Pt remains safe at this time.

## 2013-01-14 NOTE — Progress Notes (Signed)
[facility] hospitalists followup note:  Was asked to reevaluate patient for peripheral edema. Patient seen last week by hospitalists and signed off. At that time Lasix started. Psychiatry PA noted continued lower extremity edema 1-2+ bilaterally. Patient seen and he himself states that leg still swollen, although better than when he first arrived to behavioral health.  Cardiovascular: Regular rate and rhythm, S1-S2 Lungs: Clear to auscultation bilaterally Abdomen: Soft, nontender, nondistended, positive bowel sounds Extremities: 2+ pitting edema from the mid calf down bilaterally, signs of peripheral neuropathy  Assessment and plan: Continued pitting edema. Patient otherwise asymptomatic. We'll plan to increase Lasix to 40 mg by mouth daily.

## 2013-01-14 NOTE — BHH Group Notes (Signed)
[facility] LCSW Aftercare Discharge Planning Group Note   01/14/2013 11:54 AM  Participation Quality:  Engaged  Mood/Affect:  Appropriate  Depression Rating:  denies  Anxiety Rating:  denies  Thoughts of Suicide:  No Will you contract for safety?   NA  Current AVH:  No  Plan for Discharge/Comments:  CM left msg for public defender to find out how to secure required paperwork to get patient into [facility].  Pt has also been cleared to have PASSR assessment.  Transportation Means: sheriff  Supports: family  [place], [place] Distance B

## 2013-01-14 NOTE — Progress Notes (Signed)
Patient ID: Albert Hess, male   DOB: [birth year], 67 y.o.   MRN: [id number] [facility] MD Progress Note  01/14/2013 10:31 AM Albert Hess  MRN:  [id number]  Subjective: "I slept like a bear!" "Am I going home today?" "It's Tuesday."  Objective: Trueman hasn't slept >5  hours all weekend. He is still disorganized, was found attempting to put hot sauce on his feet despite being on a 1:1. He is still disorganized, but seems to be in a positive mood. He remains cooperative and pleasant, but is also grandiose and claiming to own most of [place], and now is singing rap.  He was supposed to be going to the [facility] on Friday but a paper work Data processing manager cancelled this.  CM is checking with the [place] and [place] attorney to see what can be done to expedite the correction of this.  Diagnosis:  Bipolar I disorder, most recent episode (or current) manic   ADL's:  Impaired  Sleep: "Like a bear." 4.25  Appetite:  Good  Suicidal Ideation:  denies Homicidal Ideation:  denies AEB (as evidenced by):  Psychiatric Specialty Exam: Review of Systems  Constitutional: Negative.  Negative for fever, chills, weight loss, malaise/fatigue and diaphoresis.  HENT: Negative for congestion and sore throat.   Eyes: Negative for blurred vision, double vision and photophobia.  Respiratory: Negative for cough, shortness of breath and wheezing.   Cardiovascular: Negative for chest pain, palpitations and PND.  Gastrointestinal: Negative for heartburn, nausea, vomiting, abdominal pain, diarrhea and constipation.  Musculoskeletal: Negative for myalgias, joint pain and falls.  Neurological: Negative for dizziness, tingling, tremors, sensory change, speech change, focal weakness, seizures, loss of consciousness, weakness and headaches.  Endo/Heme/Allergies: Negative for polydipsia. Does not bruise/bleed easily.  Psychiatric/Behavioral: Negative for depression, suicidal ideas, hallucinations, memory loss and substance abuse.  The patient is not nervous/anxious and does not have insomnia.     Blood pressure 127/79, pulse 90, temperature 97 F (36.1 C), temperature source Oral, resp. rate 18, height 5' 10.25" (1.784 m), weight 102.059 kg (225 lb).Body mass index is 32.07 kg/(m^2).  General Appearance: Fairly Groomed  Patent attorney::  Fair  Speech:  Normal   Volume:  Normal  Mood:  ebullient   Affect:  grandiose  Thought Process:  delusional  Orientation:  It's Tuesday.  Thought Content:  Delusions   Suicidal Thoughts:  No  Homicidal Thoughts:  No  Memory:  Immediate;   Fair  Judgement:  Impaired  Insight:  poor  Psychomotor Activity:  Restlessness  Concentration:  Poor  Recall:  Fair  Akathisia:  No  Handed:  Right  AIMS (if indicated):     Assets:  Social Support  Sleep:  Number of Hours: 4.75   Current Medications: Current Facility-Administered Medications  Medication Dose Route Frequency Provider Last Rate Last Dose  . acetaminophen (TYLENOL) tablet 650 mg  650 mg Oral Q6H PRN Kerry Hough, PA-C      . alum & mag hydroxide-simeth (MAALOX/MYLANTA) 200-200-20 MG/5ML suspension 30 mL  30 mL Oral Q4H PRN Kerry Hough, PA-C   30 mL at 12/29/12 0132  . hydrOXYzine (ATARAX/VISTARIL) tablet 25 mg  25 mg Oral Q6H PRN Sanjuana Kava, NP   25 mg at 01/01/13 1644  . lisinopril (PRINIVIL,ZESTRIL) tablet 10 mg  10 mg Oral Daily Kerry Hough, PA-C   10 mg at 01/06/13 0801  . magnesium hydroxide (MILK OF MAGNESIA) suspension 30 mL  30 mL Oral Daily PRN Mena Goes  Simon, PA-C      . polyvinyl alcohol (LIQUIFILM TEARS) 1.4 % ophthalmic solution 1 drop  1 drop Both Eyes PRN Thermon Leyland, NP   1 drop at 01/06/13 2140  . risperiDONE (RISPERDAL) tablet 2 mg  2 mg Oral BID Verne Spurr, PA-C   2 mg at 01/07/13 0859  . simvastatin (ZOCOR) tablet 5 mg  5 mg Oral q1800 Kerry Hough, PA-C   5 mg at 01/06/13 1620  . traZODone (DESYREL) tablet 100 mg  100 mg Oral QHS Verne Spurr, PA-C   100 mg at 01/06/13 2140  .  vitamin B-12 (CYANOCOBALAMIN) tablet 100 mcg  100 mcg Oral Daily Kathlen Mody, MD   100 mcg at 01/07/13 0859  . white petrolatum (VASELINE) gel   Topical Daily Mojeed Akintayo        Lab Results:  Results for orders placed during the hospital encounter of 12/25/12 (from the past 48 hour(s))  GLUCOSE, CAPILLARY     Status: None   Collection Time    01/05/13 12:03 PM      Result Value Range   Glucose-Capillary 77  70 - 99 mg/dL   Comment 1 Documented in Chart     Comment 2 Notify RN    GLUCOSE, CAPILLARY     Status: Abnormal   Collection Time    01/05/13  5:10 PM      Result Value Range   Glucose-Capillary 110 (*) 70 - 99 mg/dL   Comment 1 Documented in Chart     Comment 2 Notify RN    GLUCOSE, CAPILLARY     Status: Abnormal   Collection Time    01/06/13  6:21 AM      Result Value Range   Glucose-Capillary 122 (*) 70 - 99 mg/dL  GLUCOSE, CAPILLARY     Status: None   Collection Time    01/06/13 12:11 PM      Result Value Range   Glucose-Capillary 94  70 - 99 mg/dL  GLUCOSE, CAPILLARY     Status: Abnormal   Collection Time    01/06/13  4:45 PM      Result Value Range   Glucose-Capillary 105 (*) 70 - 99 mg/dL   Comment 1 Notify RN    GLUCOSE, CAPILLARY     Status: None   Collection Time    01/07/13  6:25 AM      Result Value Range   Glucose-Capillary 85  70 - 99 mg/dL    Physical Findings: 1+ pitting edema to both lower extremities, skin is cracking. AIMS: Facial and Oral Movements Muscles of Facial Expression: None, normal Lips and Perioral Area: None, normal Jaw: None, normal Tongue: None, normal,Extremity Movements Upper (arms, wrists, hands, fingers): None, normal Lower (legs, knees, ankles, toes): None, normal, Trunk Movements Neck, shoulders, hips: None, normal, Overall Severity Severity of abnormal movements (highest score from questions above): None, normal Incapacitation due to abnormal movements: None, normal Patient's awareness of abnormal movements (rate  only patient's report): No Awareness, Dental Status Current problems with teeth and/or dentures?: No Does patient usually wear dentures?: No  CIWA:  CIWA-Ar Total: 1 COWS:     Treatment Plan Summary: Daily contact with patient to assess and evaluate symptoms and progress in treatment Medication management  Plan: 1. Continue patient on 1:1 to help enforce fluid restriction. 2. Recommend elevation of feet to help with fluid retention in lower extremities. 3. Continue current meds as written. 4. Reviewed results of PT consult and doppler  studies. 5. Did discuss sleep medication with Tx Team this AM including MD. Will add Rozerum for sleep. 6. Will increase Seroquel to 50mg  po at hs. 7. Will decrease Risperdal to 2mg  at hs. And continue 1mg  in AM to move forward with the cross taper. 8. CM will contact [facility] to complete legal paperwork.  Medical Decision Making Problem Points:  Established problem, stable/improving (1), Established problem, worsening (2), New problem, with additional work-up planned (4) and New problem, with no additional work-up planned (3) Data Points:  Discuss tests with performing physician (1) Review or order clinical lab tests (1) Review of medication regiment & side effects (2)  I certify that inpatient services furnished can reasonably be expected to improve the patient's condition.  [name]. Giuliano Preece [facility] 01/14/2013 10:31 AM

## 2013-01-14 NOTE — Progress Notes (Signed)
Adult Psychoeducational Group Note  Date:  01/13/2013 Time:  2010  Group Topic/Focus:  Wrap-Up Group:   The focus of this group is to help patients review their daily goal of treatment and discuss progress on daily workbooks.  Participation Level:  Active  Participation Quality:  Attentive  Affect:  Appropriate  Cognitive:  Confused  Insight: Lacking  Engagement in Group:  Engaged  Modes of Intervention:  Discussion  Additional Comments:  Pt did well in group, still shows sings of confusion. Pt believes that he has been a pt on the unit for a yr and one week. No future plans or goals given by pt. Just wants to continue to give away more vehicles while here.   Gerry Blanchfield A 01/14/2013, 3:28 AM

## 2013-01-14 NOTE — Progress Notes (Signed)
Patient ID: Albert Hess, male   DOB: 13-Jan-1946, 67 y.o.   MRN: [id number] Patient continues on 1:1 for fluid restriction.  Patient has been up ambulating well on the unit.  He converses with staff and others well.  He presents with grandious statements and rapid speech.  Patient denies any SI/HI/AVH.  He is awaiting a [facility] bed at this time.  Patient has had 900 mls as of this writing.

## 2013-01-14 NOTE — Progress Notes (Signed)
1:1 Nsg note (late entry) Patient sitting in the dayroom watching tv and interacting with peers. Patient has had coffee and snack, daily weight taken and recorded. Patient has voiced no complaints. 1:1 continues with sitter at patient's side, patient is safe.

## 2013-01-14 NOTE — Progress Notes (Addendum)
1:1 Nsg note - Patient currently lying in bed asleep eyes closed, respirations even, no distress noted. Patient is safe with sitter at patients bedside. 1:1 continues.1

## 2013-01-15 LAB — GLUCOSE, CAPILLARY
Glucose-Capillary: 94 mg/dL (ref 70–99)
Glucose-Capillary: 76 mg/dL (ref 70–99)

## 2013-01-15 MED ORDER — RISPERIDONE 1 MG PO TABS
1.0000 mg | ORAL_TABLET | Freq: Two times a day (BID) | ORAL | Status: DC
  Administered 2013-01-15 – 2013-01-21 (×12): 1 mg via ORAL
  Filled 2013-01-15 (×14): qty 1

## 2013-01-15 NOTE — Progress Notes (Signed)
Reports received from [employer] RN. Writer entered patients room and observed him lying in bed asleep with eyes closed respirations even and unlabored, no distress noted. 1:1 continues with sitter at patients bedside, patient is safe. Will continue to monitor.

## 2013-01-15 NOTE — Progress Notes (Addendum)
NSG  1:1 note 2100 01/14/13 D: Pt in bed resting with legs elevated. Pt's conversation can be described as delusional and grandiose. Pt speaks about being a Trillion-naire who is going to give away millions of dollars today. He also reports that he is giving away 15 cars. Despite pt being delusional, he correctly verbalizes the purpose of elevating his legs. Pt continues to display +1BLE pitting edema with his left leg being more swollen than his right. Pt is currently denying any SI/HI or AVH.  A: Writer administers medications and continues to check on pt every four hours at a minimum. Writer and current sitter is maintaining this pt's current I&O. Continued support and availability as needed has been extended to this pt. Staff will continue 1:1 continuous observation for pt. R: Pt remains safe at this time

## 2013-01-15 NOTE — Progress Notes (Signed)
The focus of this group is to help patients review their daily goal of treatment and discuss progress on daily workbooks. Pt attended the evening group session and responded to all discussion prompts. Pt said that today was a good day because his son came to visit him during dinner visitation. Pt connected with a peer during group about their shared experiences in the [employer] and offered supportive comments to his peers. Pt was appropriate during group and remained on topic. Pt appeared engaged in the discussion.

## 2013-01-15 NOTE — Progress Notes (Addendum)
Pt son came to visit after supper today. Pt in his room with sitter present. Pt son ([name]) expressed concerns about his father being discharged this Thursday. Per pt son, He will be filing a lawsuit against the hospital and CM (Rod). Pt son is blaming CM for the reason pt [facility] not [facility]. Pt son is blaming staff for trying to release his father this week. He feels that his father is a danger to himself and is not able to care for himself. He stated he cannot care for his father at this time, neither can his sister. Per pt son, the pt is not mentally capable of caring for himself. Pt son is worried that his father will be in danger if he is discharged home.

## 2013-01-15 NOTE — Progress Notes (Signed)
Observation note: Pt at medication window with sitter present. Pt compliant with taking meds. Pt denies pain and show no s/s of distress. Pt is cooperative and pleasant. Pt remains on 1:1 observation for fluid restrictions until d/c'd.

## 2013-01-15 NOTE — Progress Notes (Addendum)
NSG 1:1 Note 0100 01/15/13  D: Pt in bed resting with eyes closed in supine position with legs elevated. Pt appears to be in no signs of distress at this time. Respirations are even and unlabored. A: Staff continues to monitor pt with 1:1 continuous observation.  R: Pt remains safe at this time.

## 2013-01-15 NOTE — Progress Notes (Addendum)
NSG 1:1 note 0500 01/15/13  D: Pt sitting on the side of he bed talking to his sitter. Pt is hype-verbal. Pt reports getting a good night sleep. Pt reports that he likes the meds that he has been placed on for bedtime. Pt keeps reporting that he is discharging today. Pt remains delusional and grandiose in conversation.  A: 1:1 continuous observation remains for this pt. R: Pt remains safe at this time

## 2013-01-15 NOTE — Progress Notes (Signed)
Patient ID: Albert Hess, male   DOB: March 30, 1946, 67 y.o.   MRN: [id number] D- Patient seems restless, frequently getting up and doing things in the room.  He is pleasant and cooperative,  He is not oriented to day but knows he is at [facility] and was at [facility] before that.  His conversation is confused at times.  He is keeping his legs elevated frequently when sitting.  He is being monitored for fluid intake. A- Supported patient talked with him about his grandchildren who he enjoys talking about.  R- 1:1 continues for fluid monitoring and safety.

## 2013-01-15 NOTE — Progress Notes (Addendum)
Patient ID: Albert Hess, male   DOB: [birth year], 67 y.o.   MRN: [id number]  Nursing 1:1 note D:Pt observed sleeping in bed with eyes closed. RR even and unlabored. No distress noted.Pt used the bathroom before going to sleep. Pt continues to be delusional, but does not seem to have as many manic episodes (during th day) as previous. Pt states he had a good day, saw son.  A: 1:1 observation continues for safety  R: pt remains safe

## 2013-01-15 NOTE — Progress Notes (Signed)
Patient ID: Albert Hess, male   DOB: 09/29/1945, 66 y.o.   MRN: [id number] [facility] MD Progress Note  01/15/2013 10:16 AM Albert Hess  MRN:  [id number]  Subjective: "I am feeling good, have not felt like this in years".  Objective: Patient reports that he doing much better, sleeping better and reports decreased mood lability and agitation. He thought process is more organized but remains delusional, saying that he total net worth is about 3 Trillion dollars. He says that he does not want to be transferred to [facility], "they treat you like meat". However, he agreed to have an ACT team monitoring his medications and home health aide if possible. He is compliant with his medications and has not reported any side effects.  Diagnosis:  Bipolar I disorder, most recent episode (or current) manic   ADL's:  Impaired  Sleep: fair  Appetite:  Good  Suicidal Ideation:  denies Homicidal Ideation:  denies AEB (as evidenced by):  Psychiatric Specialty Exam: Review of Systems  Constitutional: Negative.  Negative for fever, chills, weight loss, malaise/fatigue and diaphoresis.  HENT: Negative for congestion and sore throat.   Eyes: Negative for blurred vision, double vision and photophobia.  Respiratory: Negative for cough, shortness of breath and wheezing.   Cardiovascular: Negative for chest pain, palpitations and PND.  Gastrointestinal: Negative for heartburn, nausea, vomiting, abdominal pain, diarrhea and constipation.  Musculoskeletal: Negative for myalgias, joint pain and falls.  Neurological: Negative for dizziness, tingling, tremors, sensory change, speech change, focal weakness, seizures, loss of consciousness, weakness and headaches.  Endo/Heme/Allergies: Negative for polydipsia. Does not bruise/bleed easily.  Psychiatric/Behavioral: Negative for depression, suicidal ideas, hallucinations, memory loss and substance abuse. The patient is not nervous/anxious and does not have insomnia.      Blood pressure 111/69, pulse 95, temperature 97.3 F (36.3 C), temperature source Oral, resp. rate 16, height 5' 10.25" (1.784 m), weight 102.513 kg (226 lb).Body mass index is 32.21 kg/(m^2).  General Appearance: Fairly Groomed  Patent attorney::  Fair  Speech:  Normal   Volume:  Normal  Mood:  ebullient   Affect:  grandiose  Thought Process:  delusional  Orientation:  It's Tuesday.  Thought Content:  Delusions   Suicidal Thoughts:  No  Homicidal Thoughts:  No  Memory:  Immediate;   Fair  Judgement:  Impaired  Insight:  poor  Psychomotor Activity:  Restlessness  Concentration:  fair  Recall:  Fair  Akathisia:  No  Handed:  Right  AIMS (if indicated):     Assets:  Social Support  Sleep:  Number of Hours: 4   Current Medications: Current Facility-Administered Medications  Medication Dose Route Frequency Provider Last Rate Last Dose  . acetaminophen (TYLENOL) tablet 650 mg  650 mg Oral Q6H PRN Kerry Hough, PA-C      . alum & mag hydroxide-simeth (MAALOX/MYLANTA) 200-200-20 MG/5ML suspension 30 mL  30 mL Oral Q4H PRN Kerry Hough, PA-C   30 mL at 12/29/12 0132  . hydrOXYzine (ATARAX/VISTARIL) tablet 25 mg  25 mg Oral Q6H PRN Sanjuana Kava, NP   25 mg at 01/01/13 1644  . lisinopril (PRINIVIL,ZESTRIL) tablet 10 mg  10 mg Oral Daily Kerry Hough, PA-C   10 mg at 01/06/13 0801  . magnesium hydroxide (MILK OF MAGNESIA) suspension 30 mL  30 mL Oral Daily PRN Kerry Hough, PA-C      . polyvinyl alcohol (LIQUIFILM TEARS) 1.4 % ophthalmic solution 1 drop  1 drop Both Eyes PRN  Thermon Leyland, NP   1 drop at 01/06/13 2140  . risperiDONE (RISPERDAL) tablet 2 mg  2 mg Oral BID Verne Spurr, PA-C   2 mg at 01/07/13 0859  . simvastatin (ZOCOR) tablet 5 mg  5 mg Oral q1800 Kerry Hough, PA-C   5 mg at 01/06/13 1620  . traZODone (DESYREL) tablet 100 mg  100 mg Oral QHS Verne Spurr, PA-C   100 mg at 01/06/13 2140  . vitamin B-12 (CYANOCOBALAMIN) tablet 100 mcg  100 mcg Oral Daily  Kathlen Mody, MD   100 mcg at 01/07/13 0859  . white petrolatum (VASELINE) gel   Topical Daily Mckenize Mezera        Lab Results:  Results for orders placed during the hospital encounter of 12/25/12 (from the past 48 hour(s))  GLUCOSE, CAPILLARY     Status: None   Collection Time    01/05/13 12:03 PM      Result Value Range   Glucose-Capillary 77  70 - 99 mg/dL   Comment 1 Documented in Chart     Comment 2 Notify RN    GLUCOSE, CAPILLARY     Status: Abnormal   Collection Time    01/05/13  5:10 PM      Result Value Range   Glucose-Capillary 110 (*) 70 - 99 mg/dL   Comment 1 Documented in Chart     Comment 2 Notify RN    GLUCOSE, CAPILLARY     Status: Abnormal   Collection Time    01/06/13  6:21 AM      Result Value Range   Glucose-Capillary 122 (*) 70 - 99 mg/dL  GLUCOSE, CAPILLARY     Status: None   Collection Time    01/06/13 12:11 PM      Result Value Range   Glucose-Capillary 94  70 - 99 mg/dL  GLUCOSE, CAPILLARY     Status: Abnormal   Collection Time    01/06/13  4:45 PM      Result Value Range   Glucose-Capillary 105 (*) 70 - 99 mg/dL   Comment 1 Notify RN    GLUCOSE, CAPILLARY     Status: None   Collection Time    01/07/13  6:25 AM      Result Value Range   Glucose-Capillary 85  70 - 99 mg/dL    Physical Findings: 1+ pitting edema to both lower extremities, skin is cracking. AIMS: Facial and Oral Movements Muscles of Facial Expression: None, normal Lips and Perioral Area: None, normal Jaw: None, normal Tongue: None, normal,Extremity Movements Upper (arms, wrists, hands, fingers): None, normal Lower (legs, knees, ankles, toes): None, normal, Trunk Movements Neck, shoulders, hips: None, normal, Overall Severity Severity of abnormal movements (highest score from questions above): None, normal Incapacitation due to abnormal movements: None, normal Patient's awareness of abnormal movements (rate only patient's report): No Awareness, Dental Status Current  problems with teeth and/or dentures?: No Does patient usually wear dentures?: No  CIWA:  CIWA-Ar Total: 1 COWS:     Treatment Plan Summary: Daily contact with patient to assess and evaluate symptoms and progress in treatment Medication management  Plan: 1. Continue patient on 1:1 to help enforce fluid restriction. 2. Recommend elevation of feet to help with fluid retention in lower extremities. 3. Continue current meds as written. 4. Reviewed results of PT consult and doppler studies. 5. Will increase Seroquel to 50mg , 2 tablets po at bedtime. 6. Will decrease Risperdal to 1mg  po BID for delusions.  Medical Decision Making Problem Points:  Established problem, stable/improving (1), Established problem, worsening (2), New problem, with additional work-up planned (4) and New problem, with no additional work-up planned (3) Data Points:  Discuss tests with performing physician (1) Review or order clinical lab tests (1) Review of medication regiment & side effects (2)  I certify that inpatient services furnished can reasonably be expected to improve the patient's condition.  Aishani Kalis,MD 01/15/2013 10:16 AM

## 2013-01-15 NOTE — Progress Notes (Signed)
Psychoeducational Group Note  Date:  01/15/2013 Time:  0930  Group Topic/Focus:  Recovery Goals:   The focus of this group is to identify appropriate goals for recovery and establish a plan to achieve them.  Participation Level: Did Not Attend  Participation Quality:  Not Applicable  Affect:  Not Applicable  Cognitive:  Not Applicable  Insight:  Not Applicable  Engagement in Group: Not Applicable  Additional Comments:  Pt was asleep and could not come to group this morning.  Markise Haymer E 01/15/2013, 2:26 PM

## 2013-01-15 NOTE — BHH Group Notes (Signed)
[facility] LCSW Aftercare Discharge Planning Group Note   01/15/2013 2:02 PM  Participation Quality:  Engaged  Mood/Affect:  Bright  Depression Rating:  denies  Anxiety Rating:  denies  Thoughts of Suicide:  No Will you contract for safety?   NA  Current AVH:  No  Plan for Discharge/Comments:  Today Albert Hess greets me as he has all week with a big smile and a handshake.  Mood is good.  No complaints.  Remains delusional, reminding me and others of the red Porsche that he has purchased for me and is waiting for me to pickup.   Transportation Means: family  Supports: family  Albert Hess, Albert Hess

## 2013-01-15 NOTE — Clinical Social Work Note (Signed)
[facility] LCSW Group Therapy  01/15/2013 , 2:04 PM   Type of Therapy:  Group Therapy  Participation Level:  Minimal  Participation Quality:  Attentive  Affect:  Appropriate  Cognitive:  Alert  Insight: Limited  Engagement in Therapy:  Minimal  Modes of Intervention:  Discussion, Exploration and Socialization  Summary of Progress/Problems: Today's group focused on the term Diagnosis.  Participants were asked to define the term, and then pronounce whether it is a negative, positive or neutral term.  Albert Hess participated in the activity and enjoyed himself.  He did not participate in the discussion, neither spontaneously nor when asked questions directly.  [name] 01/15/2013 , 2:04 PM

## 2013-01-15 NOTE — Progress Notes (Signed)
Patient ID: Albert Hess, male   DOB: 01-12-1946, 67 y.o.   MRN: [id number] D- Patient came to med window for medications.  He was alert, pleasant and engaging staff in conversation.  He says he is feeling well today.  He returned to room with MHT to eat his breakfast and then attended group.  A- Encouraged pat to attend groups.  R- Patient is attending groups, his intake is being monitored by MHT.  1:1 continues for fluid intake restriction monitoring and safety.

## 2013-01-16 LAB — GLUCOSE, CAPILLARY: Glucose-Capillary: 109 mg/dL — ABNORMAL HIGH (ref 70–99)

## 2013-01-16 NOTE — Progress Notes (Signed)
Patient ID: Albert Hess, male   DOB: 03-08-1946, 67 y.o.   MRN: [id number] [facility] MD Progress Note  01/16/2013 11:24 AM Albert Hess  MRN:  [id number]  Subjective: " I just want a cup of coffee!"  Objective: Patient is irritable and fussy this morning, not wanting to stay in his wheelchair. He remains a fall risk due to neuropathy, 1+ edema.  He does comply when redirected. Wise continues to be delusional stating that he has been here for over a year. Still feels he is going to get billion dollars for agent orange, and that he has painted all of the pictures on the unit.   He is pleasant and cooperative, continues to flirt and be grandiose.  Diagnosis:  Bipolar I disorder, most recent episode (or current) manic   ADL's:  Impaired  Sleep: pretty good 5.75  Appetite:  Good  Suicidal Ideation:  denies Homicidal Ideation:  denies AEB (as evidenced by): Patient's report of decreased symptoms, improved sleep, improved mood and appetite.  Psychiatric Specialty Exam: Review of Systems  Constitutional: Negative.  Negative for fever, chills, weight loss, malaise/fatigue and diaphoresis.  HENT: Negative for congestion and sore throat.   Eyes: Negative for blurred vision, double vision and photophobia.  Respiratory: Negative for cough, shortness of breath and wheezing.   Cardiovascular: Negative for chest pain, palpitations and PND.  Gastrointestinal: Negative for heartburn, nausea, vomiting, abdominal pain, diarrhea and constipation.  Musculoskeletal: Negative for myalgias, joint pain and falls.  Neurological: Negative for dizziness, tingling, tremors, sensory change, speech change, focal weakness, seizures, loss of consciousness, weakness and headaches.  Endo/Heme/Allergies: Negative for polydipsia. Does not bruise/bleed easily.  Psychiatric/Behavioral: Negative for depression, suicidal ideas, hallucinations, memory loss and substance abuse. The patient is not nervous/anxious and does not have  insomnia.     Blood pressure 121/66, pulse 97, temperature 98.3 F (36.8 C), temperature source Oral, resp. rate 14, height 5' 10.25" (1.784 m), weight 101.152 kg (223 lb).Body mass index is 31.78 kg/(m^2).  General Appearance: Fairly Groomed  Patent attorney::  Fair  Speech:  Normal   Volume:  Normal  Mood:  ebullient   Affect:  grandiose  Thought Process:  delusional  Orientation:  It's Tuesday.   Thought Content:  Delusions   Suicidal Thoughts:  No  Homicidal Thoughts:  No  Memory:  Immediate;   Fair  Judgement:  Impaired  Insight:  poor  Psychomotor Activity:  Restlessness  Concentration:  Poor  Recall:  Fair  Akathisia:  No  Handed:  Right  AIMS (if indicated):     Assets:  Social Support  Sleep:  Number of Hours: 5.75   Current Medications: Current Facility-Administered Medications  Medication Dose Route Frequency Provider Last Rate Last Dose  . acetaminophen (TYLENOL) tablet 650 mg  650 mg Oral Q6H PRN Kerry Hough, PA-C      . alum & mag hydroxide-simeth (MAALOX/MYLANTA) 200-200-20 MG/5ML suspension 30 mL  30 mL Oral Q4H PRN Kerry Hough, PA-C   30 mL at 12/29/12 0132  . hydrOXYzine (ATARAX/VISTARIL) tablet 25 mg  25 mg Oral Q6H PRN Sanjuana Kava, NP   25 mg at 01/01/13 1644  . lisinopril (PRINIVIL,ZESTRIL) tablet 10 mg  10 mg Oral Daily Kerry Hough, PA-C   10 mg at 01/06/13 0801  . magnesium hydroxide (MILK OF MAGNESIA) suspension 30 mL  30 mL Oral Daily PRN Kerry Hough, PA-C      . polyvinyl alcohol (LIQUIFILM TEARS) 1.4 % ophthalmic  solution 1 drop  1 drop Both Eyes PRN Thermon Leyland, NP   1 drop at 01/06/13 2140  . risperiDONE (RISPERDAL) tablet 2 mg  2 mg Oral BID Verne Spurr, PA-C   2 mg at 01/07/13 0859  . simvastatin (ZOCOR) tablet 5 mg  5 mg Oral q1800 Kerry Hough, PA-C   5 mg at 01/06/13 1620  . traZODone (DESYREL) tablet 100 mg  100 mg Oral QHS Verne Spurr, PA-C   100 mg at 01/06/13 2140  . vitamin B-12 (CYANOCOBALAMIN) tablet 100 mcg  100  mcg Oral Daily Kathlen Mody, MD   100 mcg at 01/07/13 0859  . white petrolatum (VASELINE) gel   Topical Daily Mojeed Akintayo        Lab Results:  Results for orders placed during the hospital encounter of 12/25/12 (from the past 48 hour(s))  GLUCOSE, CAPILLARY     Status: None   Collection Time    01/05/13 12:03 PM      Result Value Range   Glucose-Capillary 77  70 - 99 mg/dL   Comment 1 Documented in Chart     Comment 2 Notify RN    GLUCOSE, CAPILLARY     Status: Abnormal   Collection Time    01/05/13  5:10 PM      Result Value Range   Glucose-Capillary 110 (*) 70 - 99 mg/dL   Comment 1 Documented in Chart     Comment 2 Notify RN    GLUCOSE, CAPILLARY     Status: Abnormal   Collection Time    01/06/13  6:21 AM      Result Value Range   Glucose-Capillary 122 (*) 70 - 99 mg/dL  GLUCOSE, CAPILLARY     Status: None   Collection Time    01/06/13 12:11 PM      Result Value Range   Glucose-Capillary 94  70 - 99 mg/dL  GLUCOSE, CAPILLARY     Status: Abnormal   Collection Time    01/06/13  4:45 PM      Result Value Range   Glucose-Capillary 105 (*) 70 - 99 mg/dL   Comment 1 Notify RN    GLUCOSE, CAPILLARY     Status: None   Collection Time    01/07/13  6:25 AM      Result Value Range   Glucose-Capillary 85  70 - 99 mg/dL    Physical Findings: 1+ pitting edema to both lower extremities, skin is cracking. AIMS: Facial and Oral Movements Muscles of Facial Expression: None, normal Lips and Perioral Area: None, normal Jaw: None, normal Tongue: None, normal,Extremity Movements Upper (arms, wrists, hands, fingers): None, normal Lower (legs, knees, ankles, toes): None, normal, Trunk Movements Neck, shoulders, hips: None, normal, Overall Severity Severity of abnormal movements (highest score from questions above): None, normal Incapacitation due to abnormal movements: None, normal Patient's awareness of abnormal movements (rate only patient's report): No Awareness, Dental  Status Current problems with teeth and/or dentures?: No Does patient usually wear dentures?: No  CIWA:  CIWA-Ar Total: 1 COWS:     Treatment Plan Summary: Daily contact with patient to assess and evaluate symptoms and progress in treatment Medication management  Plan: 1. Continue patient on 1:1 to due to fall risk. 2. Recommend elevation of feet to help with fluid retention in lower extremities. 3. Continue current meds as written..  4. Will continue current Seroquel hs. 6. CM will contact family to discuss pending discharge on Friday. 7. Reviewed last labs drawn  to evaluate electrolytes  Medical Decision Making Problem Points:  Established problem, stable/improving (1), Established problem, worsening (2), New problem, with additional work-up planned (4) and New problem, with no additional work-up planned (3) Data Points:  Discuss tests with performing physician (1) Review or order clinical lab tests (1) Review of medication regiment & side effects (2)  I certify that inpatient services furnished can reasonably be expected to improve the patient's condition.  [name]. Mataya Kilduff [facility] 01/16/2013 11:24 AM

## 2013-01-16 NOTE — Progress Notes (Signed)
1:1 Note  Patient currently in dayroom with MHT present. Patient given scheduled medications and denies any current needs at this time. Will continue to monitor patient for safety.

## 2013-01-16 NOTE — Progress Notes (Addendum)
1:1 Note  Patient currently in group with MHT present. MHT aware to come to RN with any patient needs. Will continue to monitor patient for safety.

## 2013-01-16 NOTE — Progress Notes (Signed)
1:1 Nsg note- late entry- Writer observed patient sitting in the dayroom watching tv but no interaction with peers. Patient was weighed and cbg done. Patient voiced no complaints. 1:1 continues, patient is safe.

## 2013-01-16 NOTE — Progress Notes (Addendum)
Recreation Therapy Notes  Date: 04.30.2014 Time: 9:30am Location: 400 Dayroom  Group Topic/Focus: Problem Solving & Memory  Participation Level:  Active  Participation Quality:  Appropriate  Affect:  Euthymic  Cognitive:  Dellusional  Additional Comments: Activity: Exile & Memory Games ; Explanation: Exile - LRT reads a scenario to patients about being exile to a [place] for one year. Patients are asked to identify one piece of music they would like to have, one book and one luxury item. Memory games - Patients were shown four cards for five seconds and were asked to remember what the cards were. The second round entailed being shown four cards for four seconds. Patients were asked to repeat a sequence of actions created by LRT. LRT added new actions as the sequence was completed by the group.   Patient participated in group activities. Patient stated he would like reggae music for his musical choice. Patient was not able to identify a specific artist or CD he would like. Patient stated he would like a book on [employer] to read. Patient stated he find the former President interesting and he would like to learn more about him. Patient stated he would like his MP3 player because it has over 300 movies stored on it for his luxury items. Patient participated in card memory game, patient was able to recall the cards that had been shown to him during the second round. Patient was able to complete the sequence of actions.   Patient stated he is good a planning in advance and he often plans at least 20 years in advance. LRT explained that 20 years is a large amount of time and is very difficult to plan that far in advance. 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 Patient stated that because he builds computers for [employer] it is necessary for him to plan very far in advance.   Marykay Lex Whitley Patchen, LRT/CTRS  Adilynn Bessey L 01/16/2013  12:20 PM

## 2013-01-16 NOTE — [facility] Group Notes (Signed)
[facility] LCSW Aftercare Discharge Planning Group Note   01/16/2013 11:42 AM  Participation Quality:  Did not attend      [place]

## 2013-01-16 NOTE — Progress Notes (Signed)
D: [name] and auditory and visual hallucinations. The patient has a euphoric mood and affect. The patient reports having an improved appetite and states that his energy level is "hyper." The patient rates his depression and hopelessness both a 0 out of 10 (1 low/10 high). The patient is attending groups and interacting appropriately within the milieu.  A: Patient given emotional support from RN. Patient encouraged to come to staff with concerns and/or questions. Patient's medication routine continued. Patient's orders and plan of care reviewed.  R: Patient remains cooperative. 1:1 continued. Will continue to monitor patient q15 minutes for safety.

## 2013-01-16 NOTE — Clinical Social Work Note (Signed)
Yesterday afternoon, I called Albert Hess's son Albert Hess to update him of plan.  He immediately launched into a tirade about how I had messed up the paperwork to get [facility] into [facility], and escalated from there.  I tried to let him know that we were flexible in our plan of discharging Albert Hess, and tried to talk to him about that, but he was solely focused on the fact that Albert Hess needed to get into [facility] from here.  When I was unable to get any more words in edgewise, and he began to curse and threaten me with lawsuits, I hung up. He called back and talked to Albert Hess.  Today the treatment team called Albert Hess to offer him support in terms of a notary public so he could get health care power of atty for his father in order to move towards placement of his father.  He came in later in the afternoon and signed paperwork with his father.  He also brought a bank statement that puts his father's income at 1267.00, $20.00 over the limit for [facility].  Explained that this means his father will need to pay out of pocket for placement while the family pursues guardianship and establishing connection with the [place].  Also explained we have been issued a PASSR number, and that I would be sending out FL2's to check on placement.

## 2013-01-16 NOTE — Clinical Social Work Note (Signed)
[facility] Group Therapy  01/16/2013 , 1:25 PM    Type of Therapy:  [employer] Presentation  Participation Level:  Minimal  Participation Quality:  [place]  Affect:  Blunted  Cognitive:  Oriented  Insight:  Limited  Engagement in Therapy:  None  Modes of Intervention:  Discussion, Education and Socialization  Summary of Progress/Problems:  Onalee Hua from [employer] came to present his recovery story and play the guitar.  Albert Hess dozed through most of group.  He got up to stretch and move around at one point, and then dozed off again.  He was appreciative of David's time, and let him know so.  [name] 01/16/2013 , 1:25 PM

## 2013-01-16 NOTE — Progress Notes (Signed)
1:1 Note  Patient currently in group interacting with other patients. MHT present. Will continue to monitor patient for safety.

## 2013-01-16 NOTE — Progress Notes (Signed)
1:1 Nsg note- Patient currently lying in bed asleep with eyes closed, respirations even and unlabored. No distress noted. 1:1 continues, patient is safe. Will continue to monitor.

## 2013-01-17 LAB — GLUCOSE, CAPILLARY
Glucose-Capillary: 87 mg/dL (ref 70–99)
Glucose-Capillary: 101 mg/dL — ABNORMAL HIGH (ref 70–99)

## 2013-01-17 NOTE — Progress Notes (Addendum)
Patient ID: Albert Hess, male   DOB: 03/01/1946, 67 y.o.   MRN: [id number]  Nursing 1:1 note D:Pt observed in dayroom sitting. RR even and unlabored. No distress noted. Pt slept most of the night. Pt states " he feels good, from his sleep.  A: 1:1 observation continues for safety  R: pt remains safe

## 2013-01-17 NOTE — Progress Notes (Signed)
Nursing 1:1 note D:Pt observed sleeping in bed with eyes closed. RR even and unlabored. No distress noted. A: 1:1 observation continues for safety  R: pt remains safe  

## 2013-01-17 NOTE — Progress Notes (Addendum)
Nursing 1:1 note D:Pt observed sleeping in bed with eyes closed. RR even and unlabored. No distress noted. Per Tech, pt did get up to go to the bathroom.  A: 1:1 observation continues for safety  R: pt remains safe

## 2013-01-17 NOTE — Progress Notes (Addendum)
Patient ID: Albert Hess, male   DOB: 09-27-1945, 67 y.o.   MRN: [id number] D: Patient presents with bright affect; euphoric mood.  He remains with grandious ideas.  He reports that he has "a huge yacht" and that he is going to take one of our staff members to [place] with him.  He also states, "I am going to marry my ex-wife."  Patient mania has decreased and his thought processes are somewhat clearer.  He denies any SI/HI/AVH.  He continues to be on 1500 ml fluid restriction per day.  A: Continue to monitor fluid I/O's.  Patient to remain 1:1 to monitor same.  Monitor medication management and MD orders.  R: Patient is interacting well with staff and others.  He is attending groups and participating in his treatment.

## 2013-01-17 NOTE — [facility] Group Notes (Signed)
[facility] LCSW Aftercare Discharge Planning Group Note   01/17/2013 12:52 PM  Participation Quality:  Attentive  Mood/Affect:  Appropriate  Depression Rating:  denies  Anxiety Rating:  denies  Thoughts of Suicide:  No Will you contract for safety?   NA  Current AVH:  No  Plan for Discharge/Comments:  Albert Hess is in a good mood.  He shakes my hand as usual, and states that it is "a wonderful day to be alive."  He is happy because he believes he may be released soon.  Still using wheelchair, and on 1:1 for fall risk.  Transportation Means:  family  Supports: family  [name], Baldo Daub

## 2013-01-17 NOTE — Clinical Social Work Note (Signed)
[facility] Group Notes:  (Counselor/Nursing/MHT/Case Management/Adjunct)  08/02/2012 2:20 PM  Type of Therapy:  Group Therapy  Participation Level:  Minimal  Participation Quality:  Attentive  Affect:  Appropriate  Cognitive:  Oriented  Insight:  Limited  Engagement in Group:  Engaged  Engagement in Therapy:  Limited  Modes of Intervention:  Discussion and Socialization  Summary of Progress/Problems: .balance: The topic for group was balance in life.  Pt participated in the discussion about when their life was in balance and out of balance and how this feels.  Pt discussed ways to get back in balance and short term goals they can work on to get where they want to be. Albert Hess feels balanced.  He moves from imbalance to balance by "going for a ride in my jet or on my yacht."  This led to an interesting discussion about "escape" among group members.  Albert Hess continues in a good mood.   Albert Hess 08/02/2012, 2:20 PM

## 2013-01-17 NOTE — Progress Notes (Addendum)
Patient ID: Albert Hess, male   DOB: June 09, 1946, 67 y.o.   MRN: [id number] 1:1 observation note:  Patient continues to interact well with staff and peers on the milieu.  He has been attending groups and participating.  He is cooperative and redirectable.  Patient continues to be on fluid restriction.  His CBGs have been within normal limits.  Patient remains with grandious ideas, however, is pleasant and calm.  Continue to support and encourage.

## 2013-01-17 NOTE — Progress Notes (Signed)
Patient ID: Albert Hess, male   DOB: April 27, 1946, 67 y.o.   MRN: [id number] 1:1 observation note: Patient has been sitting in the day room enjoying a movie with his peers.  He remains calm and cooperative.  He has been advised he has met his fluid restriction due to it is now 1450 mls.  Informed patient he was done for today; he stated, "stick a fork in me."  He has been ambulating in a wheelchair at times, however, he is steady on his feet without the wheelchair.

## 2013-01-17 NOTE — Progress Notes (Signed)
Patient ID: Albert Hess, male   DOB: Nov 06, 1945, 67 y.o.   MRN: [id number] [facility] MD Progress Note  01/17/2013 2:39 PM SPARSH CALLENS  MRN:  [id number] Subjective: "I'm doing well."   Objective:  Patient is doing well, bright affect, cooperative as usual, feels positive regarding the meeting with his son yesterday. He is willing to go to ALF as soon as one can be located. He notes that he slept well on the medication. Diagnosis:  Bipolar I disorder, most recent episode (or current) manic   ADL's:  Impaired  Sleep: good 6 hrs.  Appetite:  Good  Suicidal Ideation:  denies Homicidal Ideation:  denies AEB (as evidenced by): Patient's report of decreased symptoms, improved sleep, improved mood and appetite.  Psychiatric Specialty Exam: Review of Systems  Constitutional: Negative.  Negative for fever, chills, weight loss, malaise/fatigue and diaphoresis.  HENT: Negative for congestion and sore throat.   Eyes: Negative for blurred vision, double vision and photophobia.  Respiratory: Negative for cough, shortness of breath and wheezing.   Cardiovascular: Negative for chest pain, palpitations and PND.  Gastrointestinal: Negative for heartburn, nausea, vomiting, abdominal pain, diarrhea and constipation.  Musculoskeletal: Negative for myalgias, joint pain and falls.  Neurological: Negative for dizziness, tingling, tremors, sensory change, speech change, focal weakness, seizures, loss of consciousness, weakness and headaches.  Endo/Heme/Allergies: Negative for polydipsia. Does not bruise/bleed easily.  Psychiatric/Behavioral: Negative for depression, suicidal ideas, hallucinations, memory loss and substance abuse. The patient is not nervous/anxious and does not have insomnia.     Blood pressure 117/75, pulse 109, temperature 97.3 F (36.3 C), temperature source Oral, resp. rate 16, height 5' 10.25" (1.784 m), weight 101.152 kg (223 lb).Body mass index is 31.78 kg/(m^2).  General Appearance: Fairly  Groomed  Patent attorney::  good  Speech:  Normal   Volume:  Normal  Mood:  euthymic  Affect:  grandiose  Thought Process:  delusional  Orientation:  2/3  Thought Content:  Delusions   Suicidal Thoughts:  No  Homicidal Thoughts:  No  Memory:  Immediate;   Fair  Judgement:  Impaired  Insight:  poor  Psychomotor Activity:  normal  Concentration:  Poor  Recall:  Fair  Akathisia:  No  Handed:  Right  AIMS (if indicated):     Assets:  Social Support  Sleep:  Number of Hours: 6   Current Medications: Current Facility-Administered Medications  Medication Dose Route Frequency Provider Last Rate Last Dose  . acetaminophen (TYLENOL) tablet 650 mg  650 mg Oral Q6H PRN Kerry Hough, PA-C      . alum & mag hydroxide-simeth (MAALOX/MYLANTA) 200-200-20 MG/5ML suspension 30 mL  30 mL Oral Q4H PRN Kerry Hough, PA-C   30 mL at 12/29/12 0132  . hydrOXYzine (ATARAX/VISTARIL) tablet 25 mg  25 mg Oral Q6H PRN Sanjuana Kava, NP   25 mg at 01/01/13 1644  . lisinopril (PRINIVIL,ZESTRIL) tablet 10 mg  10 mg Oral Daily Kerry Hough, PA-C   10 mg at 01/06/13 0801  . magnesium hydroxide (MILK OF MAGNESIA) suspension 30 mL  30 mL Oral Daily PRN Kerry Hough, PA-C      . polyvinyl alcohol (LIQUIFILM TEARS) 1.4 % ophthalmic solution 1 drop  1 drop Both Eyes PRN Thermon Leyland, NP   1 drop at 01/06/13 2140  . risperiDONE (RISPERDAL) tablet 2 mg  2 mg Oral BID Verne Spurr, PA-C   2 mg at 01/07/13 0859  . simvastatin (ZOCOR) tablet 5 mg  5 mg Oral q1800 Kerry Hough, PA-C   5 mg at 01/06/13 1620  . traZODone (DESYREL) tablet 100 mg  100 mg Oral QHS Verne Spurr, PA-C   100 mg at 01/06/13 2140  . vitamin B-12 (CYANOCOBALAMIN) tablet 100 mcg  100 mcg Oral Daily Kathlen Mody, MD   100 mcg at 01/07/13 0859  . white petrolatum (VASELINE) gel   Topical Daily Mojeed Akintayo        Lab Results:  Results for orders placed during the hospital encounter of 12/25/12 (from the past 48 hour(s))  GLUCOSE,  CAPILLARY     Status: None   Collection Time    01/05/13 12:03 PM      Result Value Range   Glucose-Capillary 77  70 - 99 mg/dL   Comment 1 Documented in Chart     Comment 2 Notify RN    GLUCOSE, CAPILLARY     Status: Abnormal   Collection Time    01/05/13  5:10 PM      Result Value Range   Glucose-Capillary 110 (*) 70 - 99 mg/dL   Comment 1 Documented in Chart     Comment 2 Notify RN    GLUCOSE, CAPILLARY     Status: Abnormal   Collection Time    01/06/13  6:21 AM      Result Value Range   Glucose-Capillary 122 (*) 70 - 99 mg/dL  GLUCOSE, CAPILLARY     Status: None   Collection Time    01/06/13 12:11 PM      Result Value Range   Glucose-Capillary 94  70 - 99 mg/dL  GLUCOSE, CAPILLARY     Status: Abnormal   Collection Time    01/06/13  4:45 PM      Result Value Range   Glucose-Capillary 105 (*) 70 - 99 mg/dL   Comment 1 Notify RN    GLUCOSE, CAPILLARY     Status: None   Collection Time    01/07/13  6:25 AM      Result Value Range   Glucose-Capillary 85  70 - 99 mg/dL    Physical Findings: Edema is improving with the increase in Lasix. AIMS: Facial and Oral Movements Muscles of Facial Expression: None, normal Lips and Perioral Area: None, normal Jaw: None, normal Tongue: None, normal,Extremity Movements Upper (arms, wrists, hands, fingers): None, normal Lower (legs, knees, ankles, toes): None, normal, Trunk Movements Neck, shoulders, hips: None, normal, Overall Severity Severity of abnormal movements (highest score from questions above): None, normal Incapacitation due to abnormal movements: None, normal Patient's awareness of abnormal movements (rate only patient's report): No Awareness, Dental Status Current problems with teeth and/or dentures?: No Does patient usually wear dentures?: No  CIWA:  CIWA-Ar Total: 1 COWS:     Treatment Plan Summary: Daily contact with patient to assess and evaluate symptoms and progress in treatment Medication  management  Plan: 1. Continue patient on 1:1 to due to fall risk. 2. Recommend elevation of feet to help with fluid retention in lower extremities. 3. Continue current meds as written..  4. Will continue current Seroquel hs. 6. CM will contact family to discuss pending discharge on Friday. 7. Reviewed last labs drawn to evaluate electrolytes  Medical Decision Making Problem Points:  Established problem, stable/improving (1), Established problem, worsening (2), New problem, with additional work-up planned (4) and New problem, with no additional work-up planned (3) Data Points:  Discuss tests with performing physician (1) Review or order clinical lab tests (1) Review of  medication regiment & side effects (2)  I certify that inpatient services furnished can reasonably be expected to improve the patient's condition.  [name]. Edith Groleau [facility] 01/17/2013 2:39 PM

## 2013-01-18 LAB — GLUCOSE, CAPILLARY
Glucose-Capillary: 114 mg/dL — ABNORMAL HIGH (ref 70–99)
Glucose-Capillary: 124 mg/dL — ABNORMAL HIGH (ref 70–99)
Glucose-Capillary: 131 mg/dL — ABNORMAL HIGH (ref 70–99)

## 2013-01-18 MED ORDER — TUBERCULIN PPD 5 UNIT/0.1ML ID SOLN
5.0000 [IU] | Freq: Once | INTRADERMAL | Status: AC
  Administered 2013-01-18: 5 [IU] via INTRADERMAL

## 2013-01-18 NOTE — Clinical Social Work Note (Signed)
[facility] LCSW Group Therapy  01/18/2013 3:25 PM   Type of Therapy:  Group Therapy  Participation Level:  Active  Participation Quality:  Attentive  Affect:  Appropriate  Cognitive:  Appropriate  Insight:  Improving  Engagement in Therapy:  Engaged  Modes of Intervention:  Clarification, Education, Exploration and Socialization  Summary of Progress/Problems: Today's group focused on relapse prevention.  We defined the term, and then brainstormed on ways to prevent relapse.  Albert Hess sat quietly throughout group.  He jumped in with an invitation about a question about what one would do if they only had a year left.  He stated he would just keep doing what he is doing now, and hopes that he would leave a legacy that his family could appreciate, and would honor the family name.  [name] 01/18/2013 , 3:25 PM

## 2013-01-18 NOTE — Progress Notes (Signed)
Patient ID: Albert Hess, male   DOB: December 27, 1945, 67 y.o.   MRN: [id number] 1:1 Nursing Note: The patient most of the evening in his room in bed. At the present he is resting with his eyes closed.No distress noted. He has been compliant with medication this evening. Fluid intake monitored. 1:1 maintained for safety. Will continue to monitor.

## 2013-01-18 NOTE — Clinical Social Work Note (Signed)
Albert Hess was visited by [facility] today.  They offered him a bed.  Spoke with son [name] who is on board with this.  Plan to transfer on Monday.

## 2013-01-18 NOTE — Progress Notes (Signed)
Recreation Therapy Notes  Date: 05.02.2014  Time: 9:30am  Location: [address] Day Room   Group Topic/Focus: Memory, Cognition   Participation Level:  Active   Participation Quality:  Appropriate   Affect:  Euthymic   Cognitive:  Oriented   Additional Comments: Activity: Color Block & Lego Sequence ; Explanation: Color Block - LRT showed patients colors that were written in a different color. (IE: Blue written in green marker). Round 1: LRT asked patients to identify the written color. Round 2: LRT asked patient to identify the color of writing. Lego Sequence - LRT showed patients a sequence of Legos in different colors. (IE: Red, Yellow, Blue, Green). LRT added colors to the sequence as well as shortened the amount of time patients got to look at the sequence.   Patient participated in group activities. Patient was able to state both the name of the color as well as the color of writing. Patient was successful at Lego sequence 3 out of 5 rounds. Patient read the sequence from left to right.   Patietn stated he has been in this hospital for one year and one month. LRT tried to explain to patient he had not been in this facility for that length of time. Patient was adamate about his length of stay. Patient stated "Oh yes I have, you remember when you've been here for one year and one week." Patient then stated that he had a heart attack the day before he was admitted to [facility]. Patient stated he currently feels fine.   Marykay Lex Akirah Storck, LRT/CTRS  Tranise Forrest L 01/18/2013 2:51 PM

## 2013-01-18 NOTE — [facility] Group Notes (Signed)
[facility] LCSW Aftercare Discharge Planning Group Note   01/18/2013 9:54 AM  Participation Quality:  Engaged  Mood/Affect:  Appropriate  Depression Rating:  denies  Anxiety Rating:  denies  Thoughts of Suicide:  No Will you contract for safety?   NA  Current AVH:  No  Plan for Discharge/Comments:  Albert Hess wonders if I have an update for him.  Told him Albert Hess is supposed to come to interview this AM for ALF placement.  Transportation Means:  family  Supports: family  Albert Hess, Albert Hess

## 2013-01-18 NOTE — Progress Notes (Signed)
Adult Psychoeducational Group Note  Date:  01/18/2013 Time:  2:50 PM  Group Topic/Focus:  Overcoming Stress:   The focus of this group is to define stress and help patients assess their triggers.  Participation Level:  Minimal  Participation Quality:  Appropriate  Affect:  Appropriate  Cognitive:  Appropriate  Insight: Limited  Engagement in Group:  Engaged  Modes of Intervention:  Discussion, Education and Support  Additional Comments:  Pt actively participated in group conversation regarding triggers for stress, physical signs of stress, and positive coping skills for dealing with stress. Group discussed the difference between positive and negative stress. Group discussed that all individuals have the ability to choose if they will handle stress in a negative/destructive manner or a positive/constructive manner. Pt also actively participated in group by completing the "Stress Interview" worksheet.   Reinaldo Raddle K 01/18/2013, 2:50 PM

## 2013-01-18 NOTE — Progress Notes (Addendum)
D: [name] and auditory and visual hallucinations. Patient has a euphoric mood and affect. The patient rates his depression and hopelessness both a 0 out of 10 (1 low/10 high). The patient reports sleeping well and states that his appetite and energy level are "better than ever." The patient is tangential and delusional. The patient reports wanting to give RN "thirty million dollars for care given." The patient is attending groups on the unit. The patient's 1:1 order was continued for fall risk. The patient's I&O's are still being monitored by MHT.  A: Patient given emotional support from RN. Patient encouraged to come to staff with concerns and/or questions. Patient's medication routine continued. Patient's orders and plan of care reviewed. RN to assess the I&Os.  R: Patient remains cooperative and safe. Will continue to monitor patient q15 minutes for safety.

## 2013-01-18 NOTE — Progress Notes (Signed)
1:1 note - D. Pt has been up and has attended afternoon rec therapy group and also went to the cafeteria for dinner. Pt is pleasant and jovial upon approach. Pt spoke about going to [facility] and thinks he is going tomorrow. Attempted to let pt know that the plan was for Monday but pt was insistent that he was discharging tomorrow. Pt did receive PPD at 1730 in right arm. Pt also received medications without incident. A. Support and encouragement provided and medication education reviewed. R. Pt verbalized understanding of medications, will continue to monitor.

## 2013-01-18 NOTE — Progress Notes (Signed)
1:1 Note  Patient currently in room with MHT present. Patient denies any needs or concerns at this time. Will continue to monitor patient for safety.

## 2013-01-18 NOTE — Progress Notes (Signed)
1:1 Note  Patient currently in room with MHT present. Patient talking to MHT about his stay here. Patient currently denies any needs or concerns at this time. Will continue to monitor patient for safety.

## 2013-01-18 NOTE — Progress Notes (Signed)
Continued support with pt.  Pt happy to see chaplain, stating that pt had been here for one year.   Chaplain provided support around pt's admission, speaking with pt about first time they chatted approximately three weeks ago.    Pt hopeful to be discharged, but uncertain of details.  Stated that he has been getting plenty of rest and feels healthier.  Described owning / inheriting [place] and states that he might be going somewhere to rest before returning home.    Chaplain provided brief emotional support.  Will continue to follow.    Belva Crome

## 2013-01-18 NOTE — Progress Notes (Signed)
Patient ID: Albert Hess, male   DOB: Feb 21, 1946, 67 y.o.   MRN: [id number] [facility] MD Progress Note  01/18/2013 9:55 AM DIMITRY HOLSWORTH  MRN:  [id number]  Subjective: "I am  doing pretty good".   Objective:  Patient reports that he is doing pretty good, sleeping better, not agitated, denies mood lability but remains delusional. He denies any side effects to his medications.  He is willing to go to Assisted Living Facility and he has been scheduled for interview today by one of the facilities.   Diagnosis:  Bipolar I disorder, most recent episode (or current) manic   ADL's:  Impaired  Sleep: fair, 4.5 hrs.  Appetite:  Good  Suicidal Ideation:  denies Homicidal Ideation:  denies AEB (as evidenced by): Patient's report of decreased symptoms, improved sleep, improved mood and appetite.  Psychiatric Specialty Exam: Review of Systems  Constitutional: Negative.  Negative for fever, chills, weight loss, malaise/fatigue and diaphoresis.  HENT: Negative for congestion and sore throat.   Eyes: Negative for blurred vision, double vision and photophobia.  Respiratory: Negative for cough, shortness of breath and wheezing.   Cardiovascular: Negative for chest pain, palpitations and PND.  Gastrointestinal: Negative for heartburn, nausea, vomiting, abdominal pain, diarrhea and constipation.  Musculoskeletal: Negative for myalgias, joint pain and falls.  Neurological: Negative for dizziness, tingling, tremors, sensory change, speech change, focal weakness, seizures, loss of consciousness, weakness and headaches.  Endo/Heme/Allergies: Negative for polydipsia. Does not bruise/bleed easily.  Psychiatric/Behavioral: Negative for depression, suicidal ideas, hallucinations, memory loss and substance abuse. The patient is not nervous/anxious and does not have insomnia.     Blood pressure 82/57, pulse 111, temperature 97.3 F (36.3 C), temperature source Oral, resp. rate 16, height 5' 10.25" (1.784 m), weight  99.338 kg (219 lb).Body mass index is 31.21 kg/(m^2).  General Appearance: Fairly Groomed  Patent attorney::  good  Speech:  Normal   Volume:  Normal  Mood:  euthymic  Affect:  grandiose  Thought Process:  Fixed delusion  Orientation:  2/3  Thought Content:  Delusions   Suicidal Thoughts:  No  Homicidal Thoughts:  No  Memory:  Immediate;   Fair  Judgement:  marginal  Insight:  marginal  Psychomotor Activity:  normal  Concentration:  fair  Recall:  Fair  Akathisia:  No  Handed:  Right  AIMS (if indicated):     Assets:  Social Support  Sleep:  Number of Hours: 4.25   Current Medications: Current Facility-Administered Medications  Medication Dose Route Frequency Provider Last Rate Last Dose  . acetaminophen (TYLENOL) tablet 650 mg  650 mg Oral Q6H PRN Kerry Hough, PA-C      . alum & mag hydroxide-simeth (MAALOX/MYLANTA) 200-200-20 MG/5ML suspension 30 mL  30 mL Oral Q4H PRN Kerry Hough, PA-C   30 mL at 12/29/12 0132  . hydrOXYzine (ATARAX/VISTARIL) tablet 25 mg  25 mg Oral Q6H PRN Sanjuana Kava, NP   25 mg at 01/01/13 1644  . lisinopril (PRINIVIL,ZESTRIL) tablet 10 mg  10 mg Oral Daily Kerry Hough, PA-C   10 mg at 01/06/13 0801  . magnesium hydroxide (MILK OF MAGNESIA) suspension 30 mL  30 mL Oral Daily PRN Kerry Hough, PA-C      . polyvinyl alcohol (LIQUIFILM TEARS) 1.4 % ophthalmic solution 1 drop  1 drop Both Eyes PRN Thermon Leyland, NP   1 drop at 01/06/13 2140  . risperiDONE (RISPERDAL) tablet 2 mg  2 mg Oral BID Verne Spurr,  PA-C   2 mg at 01/07/13 0859  . simvastatin (ZOCOR) tablet 5 mg  5 mg Oral q1800 Kerry Hough, PA-C   5 mg at 01/06/13 1620  . traZODone (DESYREL) tablet 100 mg  100 mg Oral QHS Verne Spurr, PA-C   100 mg at 01/06/13 2140  . vitamin B-12 (CYANOCOBALAMIN) tablet 100 mcg  100 mcg Oral Daily Kathlen Mody, MD   100 mcg at 01/07/13 0859  . white petrolatum (VASELINE) gel   Topical Daily Menno Vanbergen        Lab Results:  Results for  orders placed during the hospital encounter of 12/25/12 (from the past 48 hour(s))  GLUCOSE, CAPILLARY     Status: None   Collection Time    01/05/13 12:03 PM      Result Value Range   Glucose-Capillary 77  70 - 99 mg/dL   Comment 1 Documented in Chart     Comment 2 Notify RN    GLUCOSE, CAPILLARY     Status: Abnormal   Collection Time    01/05/13  5:10 PM      Result Value Range   Glucose-Capillary 110 (*) 70 - 99 mg/dL   Comment 1 Documented in Chart     Comment 2 Notify RN    GLUCOSE, CAPILLARY     Status: Abnormal   Collection Time    01/06/13  6:21 AM      Result Value Range   Glucose-Capillary 122 (*) 70 - 99 mg/dL  GLUCOSE, CAPILLARY     Status: None   Collection Time    01/06/13 12:11 PM      Result Value Range   Glucose-Capillary 94  70 - 99 mg/dL  GLUCOSE, CAPILLARY     Status: Abnormal   Collection Time    01/06/13  4:45 PM      Result Value Range   Glucose-Capillary 105 (*) 70 - 99 mg/dL   Comment 1 Notify RN    GLUCOSE, CAPILLARY     Status: None   Collection Time    01/07/13  6:25 AM      Result Value Range   Glucose-Capillary 85  70 - 99 mg/dL    Physical Findings: Edema is improving with the increase in Lasix. AIMS: Facial and Oral Movements Muscles of Facial Expression: None, normal Lips and Perioral Area: None, normal Jaw: None, normal Tongue: None, normal,Extremity Movements Upper (arms, wrists, hands, fingers): None, normal Lower (legs, knees, ankles, toes): None, normal, Trunk Movements Neck, shoulders, hips: None, normal, Overall Severity Severity of abnormal movements (highest score from questions above): None, normal Incapacitation due to abnormal movements: None, normal Patient's awareness of abnormal movements (rate only patient's report): No Awareness, Dental Status Current problems with teeth and/or dentures?: No Does patient usually wear dentures?: No  CIWA:  CIWA-Ar Total: 1 COWS:     Treatment Plan Summary: Daily contact with  patient to assess and evaluate symptoms and progress in treatment Medication management  Plan: 1. Continue patient on 1:1 to due to fall risk. 2. Recommend elevation of feet to help with fluid retention in lower extremities. 3. Continue current meds as written..  4. Will continue current Seroquel hs. 6. CM will contact family to discuss pending discharge on Friday. 7. Reviewed last labs drawn to evaluate electrolytes  Medical Decision Making Problem Points:  Established problem, stable/improving (1), Established problem, worsening (2), New problem, with additional work-up planned (4) and New problem, with no additional work-up planned (3) Data Points:  Discuss tests with performing physician (1) Review or order clinical lab tests (1) Review of medication regiment & side effects (2)  I certify that inpatient services furnished can reasonably be expected to improve the patient's condition.  Thedore Mins, MD 01/18/2013 9:55 AM

## 2013-01-19 ENCOUNTER — Encounter (HOSPITAL_COMMUNITY): Payer: Self-pay | Admitting: Registered Nurse

## 2013-01-19 LAB — GLUCOSE, CAPILLARY

## 2013-01-19 NOTE — Progress Notes (Signed)
Patient ID: Albert Hess, male   DOB: Jun 24, 1946, 67 y.o.   MRN: [id number] 01-19-13 @1000  nursing 1:1 note: D: pt has had 920 cc of fluids so far today and he ate 100 % of his breakfast. He has taken his medications and come to groups. He has taken a bath today.A: staff continue to support and encourage this patient. R: he is less grandiose than previous assessment of this patient. He continues to ask where will he go from here? He denies any si/hi/av.  The 1:1 continues.

## 2013-01-19 NOTE — [facility] Group Notes (Signed)
[facility] Group Notes:  (Clinical Social Work)  @TD @  11:15-11:45AM  Summary of Progress/Problems:   The main focus of today's process group was for the patient to identify ways in which they have in the past sabotaged their own recovery and reasons they may have done this/what they received from doing it.  This was a difficult topic for this group of patients, and several continually interrupted other patients, had to be redirected, so we did not get to explore the topic thoroughly.  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  I may have as many as 5 paintings going at one time, and that is too many, so I cannot complete one."  He left group about 15 minutes later without saying anything else, or even appearing to notice the arguments going on.  Type of Therapy:  Group Therapy - Process  Participation Level:  Minimal  Participation Quality:  Inattentive  Affect:  Depressed  Cognitive:  Confused and Delusional  Insight:  Limited  Engagement in Therapy:  Limited  Modes of Intervention:  Limit-setting, Support and Processing, Exploration, Discussion  Albert Mantle, LCSW @TD @, @NOW @

## 2013-01-19 NOTE — Progress Notes (Signed)
Patient ID: Albert Hess, male   DOB: 08/21/1946, 67 y.o.   MRN: [id number] 1:1 nursing Note: The patient was awake, pleasant and cheerful. Interacting appropriately in the milieu. Compliant with CBG. States he slept well. 1:1 maintained for safety. Will continue to monitor. Weighed in at 220 lbs this morning.

## 2013-01-19 NOTE — Progress Notes (Signed)
Patient ID: Albert Hess, male   DOB: 05/11/1946, 67 y.o.   MRN: [id number] Psychoeducational Group Note  Date:  01/19/2013 Time:1000am  Group Topic/Focus:  Identifying Needs:   The focus of this group is to help patients identify their personal needs that have been historically problematic and identify healthy behaviors to address their needs.  Participation Level:  Minimal  Participation Quality:  Appropriate  Affect:  Appropriate  Cognitive:  Lacking  Insight:  Limited  Engagement in Group:  Limited  Additional Comments: inventory group and healthy coping skills.   [name] 01/19/2013,10:35 AM

## 2013-01-19 NOTE — Progress Notes (Signed)
Patient ID: Albert Hess, male   DOB: [birth year], 67 y.o.   MRN: [id number] 01-19-13 @ 1800 nursing 1:1 note: a new order for an increase in intake was written. The new intake total will be 2000 cc. pt total intake is now at 1658 cc. His output total is now at 1800 cc. He ate 100 % of his dinner. He continues to deny any si/hi/av. He went outside for recreation and stated he enjoyed the fresh air. A: staff continues to encourage and support. Reeducated the 1:1 sitter regarding fall precautions and staying within arms reach of the patient. R: RN continue to monitor and the 1:1 continues.

## 2013-01-19 NOTE — Progress Notes (Signed)
Patient ID: Albert Hess, male   DOB: 12/30/1945, 66 y.o.   MRN: [id number] 1:1 Nursing Note: The patient is resting in bed with eyes closed. No distress noted. 1:1 maintained for safety. Will continue to monitor. 

## 2013-01-19 NOTE — Progress Notes (Signed)
Patient ID: Albert Hess, male   DOB: 21-Sep-1945, 67 y.o.   MRN: [id number] Patient ID: Albert Hess, male   DOB: 10-11-1945, 67 y.o.   MRN: [id number] [facility] MD Progress Note  01/19/2013 6:34 PM AUDRA BELLARD  MRN:  [id number]  Subjective:  Patients states "I have been in this hospital for a year.  I came in because of a slight heart attack/ stroke and I haven't been home since.  I have several layers to sue this place and I don't know why they won't let me go home. I feel good and I'm ready to go but they won't let me go home."   Diagnosis:  Bipolar I disorder, most recent episode (or current) manic   ADL's:  Impaired  Sleep: fair, 4.5 hrs. Improving Appetite:  Good  Suicidal Ideation:  denies Homicidal Ideation:  denies AEB (as evidenced by): Patient participating in group sessions and tolerating medications without adverse effects.  Psychiatric Specialty Exam: Review of Systems  Cardiovascular: Negative for leg swelling.       Patient and staff states improvement in lower ext. Swelling.  Patient want to be able to drink more liquids.  Discussed with patient and staff will increase fluid intake but must be vigilant in observation of swell in lower ext and inform a provider in the event that fluid intake would need reduction.  Musculoskeletal:       Patient sitting in wheel chair when ambulation is a great distance ambulation.  Neurological: Positive for weakness.  All other systems reviewed and are negative.    Blood pressure 90/54, pulse 103, temperature 97.8 F (36.6 C), temperature source Oral, resp. rate 16, height 5' 10.25" (1.784 m), weight 99.791 kg (220 lb).Body mass index is 31.35 kg/(m^2).  General Appearance: Fairly Groomed  Patent attorney::  good  Speech:  Normal   Volume:  Normal  Mood:  euthymic  Affect:  grandiose  Thought Process:  Fixed delusion  Orientation:  2/3  Thought Content:  Delusions   Suicidal Thoughts:  No  Homicidal Thoughts:  No  Memory:  Immediate;    Fair  Judgement:  marginal  Insight:  marginal  Psychomotor Activity:  normal  Concentration:  fair  Recall:  Fair  Akathisia:  No  Handed:  Right  AIMS (if indicated):     Assets:  Social Support  Sleep:  Number of Hours: 6.25   Current Medications: Current Facility-Administered Medications  Medication Dose Route Frequency Provider Last Rate Last Dose  . acetaminophen (TYLENOL) tablet 650 mg  650 mg Oral Q6H PRN Kerry Hough, PA-C      . alum & mag hydroxide-simeth (MAALOX/MYLANTA) 200-200-20 MG/5ML suspension 30 mL  30 mL Oral Q4H PRN Kerry Hough, PA-C   30 mL at 12/29/12 0132  . hydrOXYzine (ATARAX/VISTARIL) tablet 25 mg  25 mg Oral Q6H PRN Sanjuana Kava, NP   25 mg at 01/01/13 1644  . lisinopril (PRINIVIL,ZESTRIL) tablet 10 mg  10 mg Oral Daily Kerry Hough, PA-C   10 mg at 01/06/13 0801  . magnesium hydroxide (MILK OF MAGNESIA) suspension 30 mL  30 mL Oral Daily PRN Kerry Hough, PA-C      . polyvinyl alcohol (LIQUIFILM TEARS) 1.4 % ophthalmic solution 1 drop  1 drop Both Eyes PRN Thermon Leyland, NP   1 drop at 01/06/13 2140  . risperiDONE (RISPERDAL) tablet 2 mg  2 mg Oral BID Verne Spurr, PA-C   2 mg at 01/07/13  1610  . simvastatin (ZOCOR) tablet 5 mg  5 mg Oral q1800 Kerry Hough, PA-C   5 mg at 01/06/13 1620  . traZODone (DESYREL) tablet 100 mg  100 mg Oral QHS Verne Spurr, PA-C   100 mg at 01/06/13 2140  . vitamin B-12 (CYANOCOBALAMIN) tablet 100 mcg  100 mcg Oral Daily Kathlen Mody, MD   100 mcg at 01/07/13 0859  . white petrolatum (VASELINE) gel   Topical Daily Mojeed Akintayo        Lab Results:  Results for orders placed during the hospital encounter of 12/25/12 (from the past 48 hour(s))  GLUCOSE, CAPILLARY     Status: None   Collection Time    01/05/13 12:03 PM      Result Value Range   Glucose-Capillary 77  70 - 99 mg/dL   Comment 1 Documented in Chart     Comment 2 Notify RN    GLUCOSE, CAPILLARY     Status: Abnormal   Collection Time     01/05/13  5:10 PM      Result Value Range   Glucose-Capillary 110 (*) 70 - 99 mg/dL   Comment 1 Documented in Chart     Comment 2 Notify RN    GLUCOSE, CAPILLARY     Status: Abnormal   Collection Time    01/06/13  6:21 AM      Result Value Range   Glucose-Capillary 122 (*) 70 - 99 mg/dL  GLUCOSE, CAPILLARY     Status: None   Collection Time    01/06/13 12:11 PM      Result Value Range   Glucose-Capillary 94  70 - 99 mg/dL  GLUCOSE, CAPILLARY     Status: Abnormal   Collection Time    01/06/13  4:45 PM      Result Value Range   Glucose-Capillary 105 (*) 70 - 99 mg/dL   Comment 1 Notify RN    GLUCOSE, CAPILLARY     Status: None   Collection Time    01/07/13  6:25 AM      Result Value Range   Glucose-Capillary 85  70 - 99 mg/dL    Physical Findings: Edema is improving with the increase in Lasix. AIMS: Facial and Oral Movements Muscles of Facial Expression: None, normal Lips and Perioral Area: None, normal Jaw: None, normal Tongue: None, normal,Extremity Movements Upper (arms, wrists, hands, fingers): None, normal Lower (legs, knees, ankles, toes): None, normal, Trunk Movements Neck, shoulders, hips: None, normal, Overall Severity Severity of abnormal movements (highest score from questions above): None, normal Incapacitation due to abnormal movements: None, normal Patient's awareness of abnormal movements (rate only patient's report): No Awareness, Dental Status Current problems with teeth and/or dentures?: No Does patient usually wear dentures?: No  CIWA:  CIWA-Ar Total: 1 COWS:     Treatment Plan Summary: Daily contact with patient to assess and evaluate symptoms and progress in treatment Medication management  Plan: 1. Continue patient on 1:1 to due to fall risk. 2. Recommend elevation of feet to help with fluid retention in lower extremities. 3. Continue current meds as written..  4. Will continue current Seroquel hs. 6. CM will contact family to discuss pending  discharge on Friday. 7. Reviewed last labs drawn to evaluate electrolytes Will continue current plan and treatment.  Medical Decision Making Problem Points:  Established problem, stable/improving (1), Review of last therapy session (1) and Review of psycho-social stressors (1) Data Points:  Review or order clinical lab tests (1) Review  and summation of old records (2) Review of medication regiment & side effects (2)  I certify that inpatient services furnished can reasonably be expected to improve the patient's condition.   Shuvon B. Rankin FNP-BC Family Nurse Practitioner, Board Certified  01/19/2013 6:34 PM

## 2013-01-19 NOTE — Progress Notes (Signed)
Patient ID: Albert Hess, male   DOB: 07-14-1946, 67 y.o.   MRN: [id number] 01-19-13 @ 1400 nursing 1:1 note: D: today at 1400 pm this pt who is on restricted fluid intake had a total input thus far of 1400 cc. His output was totaled at  1075 cc at 1400.  He remains grandiose. A: staff continuously reminds him of his fluid restriction. He has not had any complaints today and stated he would like to go outside for recreation.  R: on his inventory sheet he wrote he requested medication for sleep, appetite improving, energy hyper, attention improving with his depression and hopelessness at 0. He denies any SI. He stated that he would " do what staff says". The 1:1 continues.

## 2013-01-19 NOTE — Progress Notes (Signed)
Adult Psychoeducational Group Note  Date:  01/19/2013 Time:  2000  Group Topic/Focus:  Wrap-Up Group:   The focus of this group is to help patients review their daily goal of treatment and discuss progress on daily workbooks.  Participation Level:  Did Not Attend  Participation Quality:    Affect:    Cognitive:    Insight:   Engagement in Group:    Modes of Intervention:    Additional Comments:  Pt didn't attend wrap-up group this evening.   [name] A 01/19/2013, 4:21 AM

## 2013-01-20 LAB — GLUCOSE, CAPILLARY
Glucose-Capillary: 98 mg/dL (ref 70–99)
Glucose-Capillary: 93 mg/dL (ref 70–99)
Glucose-Capillary: 114 mg/dL — ABNORMAL HIGH (ref 70–99)

## 2013-01-20 NOTE — Progress Notes (Signed)
Patient ID: Albert Hess, male   DOB: 11/28/1945, 66 y.o.   MRN: [id number] 1:1 Nursing Note: The patient is resting in bed with eyes closed. No distress noted. 1:1 maintained for safety. Will continue to monitor. 

## 2013-01-20 NOTE — [facility] Group Notes (Signed)
[facility] Group Notes:  (Clinical Social Work)  01/20/2013   11:15-11:45AM  Summary of Progress/Problems:  The main focus of today's process group was to listen to a variety of genres of music and to identify that different types of music provoke different responses.  The patient then was able to identify personally what was soothing for them, as well as energizing.  Handouts were used to record feelings evoked, as well as how patient can personally use this knowledge in sleep habits, with depression, and with other symptoms.  The patient expressed his feelings about each type of music, generally saying "good" but expanding that description when asked to do so.  He was less vocal than he has previously been about the music.  Type of Therapy:  Music Therapy   Participation Level:  Minimal  Participation Quality:  Attentive  Affect:  Blunted  Cognitive:  Alert  Insight:  Limited  Engagement in Therapy:  Limited  Modes of Intervention:   Activity, Exploration  Ambrose Mantle, LCSW 01/20/2013, 12:44 PM

## 2013-01-20 NOTE — Progress Notes (Signed)
Adult Psychoeducational Group Note  Date:  01/20/2013 Time:  1000  Group Topic/Focus:  Making Healthy Choices:   The focus of this group is to help patients identify negative/unhealthy choices they were using prior to admission and identify positive/healthier coping strategies to replace them upon discharge.  Participation Level:  Did Not Attend  Participation Quality:  Did not attend  Affect:  Did not attend  Cognitive:  Did not attend  Insight: did not attend  Engagement in Group:  Did not attend  Modes of Intervention:  Did not attend  Additional Comments:  Did not attend  Earline Mayotte 01/20/2013, 1:58 PM

## 2013-01-20 NOTE — Progress Notes (Signed)
[facility] Group Notes:  (Nursing/MHT/Case Management/Adjunct)  Date:  01/20/2013  Time:  4:40 AM  Type of Therapy:  wrap up group  Participation Level:  Minimal  Participation Quality:  Appropriate  Affect:  Depressed  Cognitive:  Appropriate  Insight:  Good  Engagement in Group:  Engaged  Modes of Intervention:  Discussion and Support  Summary of Progress/Problems: This group discussed the patients day and also focused on the positives that they experienced throughout their day.  Patient appeared depressed and was not as talkative as he usually is but did state that he had a great day, he also began to go into detail about how long he has been in this facility.  Patient stated, "I'm sick of that room and being in that room.  I'd rather be anywhere but in that room".  He stated that he had no idea about discharge plans.  Tomi Bamberger Coursey 01/20/2013, 4:40 AM

## 2013-01-20 NOTE — Progress Notes (Signed)
[facility] MD Progress Note  01/20/2013 1:26 PM Albert Hess  MRN:  [id number]  Subjective:  Patient was seen and chart reviewed. Case discussed with primary provider on regular days. Patients stated that "I have been in this hospital for a year.  I came in because of a slight heart attack/ stroke and I haven't been home since".  He has been compliant with his medication management and milieu therapy and has denied side effects of medications. He has less swelling on his feet/leg.   Diagnosis:  Bipolar I disorder, most recent episode (or current) manic   ADL's:  Impaired  Sleep: fair, 6.5 hrs.Improving  Appetite:  Good  Suicidal Ideation:  denies Homicidal Ideation:  denies AEB (as evidenced by): Patient participating in group sessions and tolerating medications without adverse effects.  Psychiatric Specialty Exam: Review of Systems  Cardiovascular: Negative for leg swelling.       Patient and staff states improvement in lower ext. Swelling.  Patient want to be able to drink more liquids.  Discussed with patient and staff will increase fluid intake but must be vigilant in observation of swell in lower ext and inform a provider in the event that fluid intake would need reduction.  Musculoskeletal:       Patient sitting in wheel chair when ambulation is a great distance ambulation.  Neurological: Positive for weakness.  All other systems reviewed and are negative.    Blood pressure 111/72, pulse 93, temperature 98.7 F (37.1 C), temperature source Oral, resp. rate 16, height 5' 10.25" (1.784 m), weight 219 lb (99.338 kg).Body mass index is 31.21 kg/(m^2).  General Appearance: Fairly Groomed  Patent attorney::  good  Speech:  Normal   Volume:  Normal  Mood:  euthymic  Affect:  grandiose  Thought Process:  Fixed delusion  Orientation:  2/3  Thought Content:  Delusions   Suicidal Thoughts:  No  Homicidal Thoughts:  No  Memory:  Immediate;   Fair  Judgement:  marginal  Insight:  marginal   Psychomotor Activity:  normal  Concentration:  fair  Recall:  Fair  Akathisia:  No  Handed:  Right  AIMS (if indicated):     Assets:  Social Support  Sleep:  Number of Hours: 6.5   Current Medications: Current Facility-Administered Medications  Medication Dose Route Frequency Provider Last Rate Last Dose  . acetaminophen (TYLENOL) tablet 650 mg  650 mg Oral Q6H PRN Kerry Hough, PA-C      . alum & mag hydroxide-simeth (MAALOX/MYLANTA) 200-200-20 MG/5ML suspension 30 mL  30 mL Oral Q4H PRN Kerry Hough, PA-C   30 mL at 12/29/12 0132  . hydrOXYzine (ATARAX/VISTARIL) tablet 25 mg  25 mg Oral Q6H PRN Sanjuana Kava, NP   25 mg at 01/01/13 1644  . lisinopril (PRINIVIL,ZESTRIL) tablet 10 mg  10 mg Oral Daily Kerry Hough, PA-C   10 mg at 01/06/13 0801  . magnesium hydroxide (MILK OF MAGNESIA) suspension 30 mL  30 mL Oral Daily PRN Kerry Hough, PA-C      . polyvinyl alcohol (LIQUIFILM TEARS) 1.4 % ophthalmic solution 1 drop  1 drop Both Eyes PRN Thermon Leyland, NP   1 drop at 01/06/13 2140  . risperiDONE (RISPERDAL) tablet 2 mg  2 mg Oral BID Verne Spurr, PA-C   2 mg at 01/07/13 0859  . simvastatin (ZOCOR) tablet 5 mg  5 mg Oral q1800 Kerry Hough, PA-C   5 mg at 01/06/13 1620  .  traZODone (DESYREL) tablet 100 mg  100 mg Oral QHS Verne Spurr, PA-C   100 mg at 01/06/13 2140  . vitamin B-12 (CYANOCOBALAMIN) tablet 100 mcg  100 mcg Oral Daily Kathlen Mody, MD   100 mcg at 01/07/13 0859  . white petrolatum (VASELINE) gel   Topical Daily Mojeed Akintayo        Lab Results:  Results for orders placed during the hospital encounter of 12/25/12 (from the past 48 hour(s))  GLUCOSE, CAPILLARY     Status: None   Collection Time    01/05/13 12:03 PM      Result Value Range   Glucose-Capillary 77  70 - 99 mg/dL   Comment 1 Documented in Chart     Comment 2 Notify RN    GLUCOSE, CAPILLARY     Status: Abnormal   Collection Time    01/05/13  5:10 PM      Result Value Range    Glucose-Capillary 110 (*) 70 - 99 mg/dL   Comment 1 Documented in Chart     Comment 2 Notify RN    GLUCOSE, CAPILLARY     Status: Abnormal   Collection Time    01/06/13  6:21 AM      Result Value Range   Glucose-Capillary 122 (*) 70 - 99 mg/dL  GLUCOSE, CAPILLARY     Status: None   Collection Time    01/06/13 12:11 PM      Result Value Range   Glucose-Capillary 94  70 - 99 mg/dL  GLUCOSE, CAPILLARY     Status: Abnormal   Collection Time    01/06/13  4:45 PM      Result Value Range   Glucose-Capillary 105 (*) 70 - 99 mg/dL   Comment 1 Notify RN    GLUCOSE, CAPILLARY     Status: None   Collection Time    01/07/13  6:25 AM      Result Value Range   Glucose-Capillary 85  70 - 99 mg/dL    Physical Findings: Edema is improving with the increase in Lasix. AIMS: Facial and Oral Movements Muscles of Facial Expression: None, normal Lips and Perioral Area: None, normal Jaw: None, normal Tongue: None, normal,Extremity Movements Upper (arms, wrists, hands, fingers): None, normal Lower (legs, knees, ankles, toes): None, normal, Trunk Movements Neck, shoulders, hips: None, normal, Overall Severity Severity of abnormal movements (highest score from questions above): None, normal Incapacitation due to abnormal movements: None, normal Patient's awareness of abnormal movements (rate only patient's report): No Awareness, Dental Status Current problems with teeth and/or dentures?: No Does patient usually wear dentures?: No  CIWA:  CIWA-Ar Total: 4 COWS:  COWS Total Score: 1  Treatment Plan Summary: Daily contact with patient to assess and evaluate symptoms and progress in treatment Medication management  Plan: 1. Continue patient on 1:1 to due to fall risk. 2. Recommend elevation of feet to help with fluid retention in lower extremities. 3. Continue current meds as written..  4. Continue current Seroquel Qhs. 6. CM will contact family to discuss disposition plans 7. Reviewed last labs  drawn to evaluate electrolytes 8. Continue current plan and treatment.  Medical Decision Making Problem Points:  Established problem, stable/improving (1), Review of last therapy session (1) and Review of psycho-social stressors (1) Data Points:  Review or order clinical lab tests (1) Review and summation of old records (2) Review of medication regiment & side effects (2)  I certify that inpatient services furnished can reasonably be expected to improve  the patient's condition.   Leata Mouse, MD  01/20/2013 1:26 PM

## 2013-01-20 NOTE — Progress Notes (Signed)
Patient ID: Albert Hess, male   DOB: 09/22/1945, 67 y.o.   MRN: [id number] 1:1 Nursing Note: The patient woke up in a pleasant mood. Offered no complaints. The edema in his feet has greatly subsided. He weighed in this morning at 219 lbs. 1:1 maintained for safety.

## 2013-01-20 NOTE — Progress Notes (Addendum)
01/20/2013 at 0900   Patient laying in bed with eyes open.  1:1 sitting in chair near bed.  Patient denied SI and HI.  Denied A/V hallucinations.  Denied pain.  Patient walked to medication window and took morning meds.  Stated he has been eating good and sleeping good.  PPD negative when checked by nurse this morning.  Patient talked about being in [place] when he was 67 years old and is thankful God brought him home safe.  Wants to know where he is going after discharge from [facility].   Stated his family wants to know so they can visit him.  Stated he has been at this hospital about a year.  Patient has been cooperative and pleasant.  1:1 continues for safety.  Safety maintained.  Patient smiling while talking to nurse.    1200  Patient denied SI and HI.  Denied A/V hallucinations.  Denied pain.  Has been resting in his room with 1:1 present.  Safety maintained.  Will continue to monitor for safety.

## 2013-01-20 NOTE — Progress Notes (Signed)
PPD read at this time. Negative test, no induration noted.

## 2013-01-20 NOTE — Progress Notes (Signed)
Patient ID: Albert Hess, male   DOB: 11-25-1945, 67 y.o.   MRN: [id number] 1:1 Nursing Note: The patient was very pleasant interacting with staff and in the milieu. However, became somewhat agitated while in group stating he can't tolerate being in his room much longer. He stated he has been in that room for "over a year now" and doesn't know where he is going to go, but he wants out of that room. Attempt made to re-orient to time and place. Became agitated and insisted he has been here for over a year. Compliant with medication. 1:1 maintained for safety. Will continue to monitor.

## 2013-01-20 NOTE — Progress Notes (Signed)
Patient ID: Albert Hess, male   DOB: 06-09-1946, 67 y.o.   MRN: [id number] 1:1 Nursing Note: The patient as a blunted mood and affect. Stated he was going to be leaving tomorrow but he did not no where he was going. Stated he had been here for one year and was ready to move on. He was reoriented to time and place. Informed that he would be going to [facility]. He was very familiar with the place and recited the address. About a half hour later in group, he again stated that he was leaving tomorrow, but did not no where. He had no recollection of our earlier conversation. Pleasant and compliant with medications. 1:1 maintained for safety. Will continue to monitor.

## 2013-01-20 NOTE — Progress Notes (Signed)
1 - 1 note. D. Pt in room at this time, sitting on bed and reading various magazine clippings. Pt smiling and pleasant upon approach. Pt spoke about being hopeful for discharge soon but spoke about not knowing where he was going. Pt did state that he has been in the hospital for over a year now and needs to go. Pt was asked about [facility] and said he wasn't sure if he was going there or not and said that he wouldn't mind going there as long as he could get out of here. Pt in no acute distress at this time and does not verbalize any complaints of pain. A. Support and encouragement provided. R. Will continue to monitor.

## 2013-01-21 LAB — GLUCOSE, CAPILLARY
Glucose-Capillary: 86 mg/dL (ref 70–99)
Glucose-Capillary: 110 mg/dL — ABNORMAL HIGH (ref 70–99)

## 2013-01-21 MED ORDER — RAMELTEON 8 MG PO TABS: 8.0000 mg | ORAL_TABLET | Freq: Every day | ORAL | Status: DC

## 2013-01-21 MED ORDER — QUETIAPINE FUMARATE 50 MG PO TABS: ORAL_TABLET | ORAL | Status: DC

## 2013-01-21 MED ORDER — FUROSEMIDE 40 MG PO TABS: 40.0000 mg | ORAL_TABLET | Freq: Every day | ORAL | Status: DC

## 2013-01-21 MED ORDER — QUETIAPINE FUMARATE 100 MG PO TABS
100.0000 mg | ORAL_TABLET | Freq: Every day | ORAL | Status: DC
  Filled 2013-01-21: qty 3

## 2013-01-21 MED ORDER — METFORMIN HCL 500 MG PO TABS: 500.0000 mg | ORAL_TABLET | Freq: Every day | ORAL | Status: DC

## 2013-01-21 MED ORDER — LISINOPRIL 10 MG PO TABS: 10.0000 mg | ORAL_TABLET | Freq: Every day | ORAL | Status: DC

## 2013-01-21 MED ORDER — TRAZODONE HCL 100 MG PO TABS: 200.0000 mg | ORAL_TABLET | Freq: Every day | ORAL | Status: DC

## 2013-01-21 MED ORDER — PRAVASTATIN SODIUM 40 MG PO TABS: 40.0000 mg | ORAL_TABLET | Freq: Every day | ORAL | Status: DC

## 2013-01-21 MED ORDER — RISPERIDONE 1 MG PO TABS: 1.0000 mg | ORAL_TABLET | Freq: Two times a day (BID) | ORAL | Status: DC

## 2013-01-21 NOTE — Progress Notes (Addendum)
Patient ID: Albert Hess, male   DOB: 27-Oct-1945, 67 y.o.   MRN: [id number] D:Patient to be discharged today per MD order.  Patient will be discharged to an assisted living facility, specifically [facility].  He will be transported today at 1200.  Patient is somewhat anxious about leaving, stating the he will "miss the staff here."  He has been attending groups and participating in his treatment.  His thought processes are clearer; remains with some tangental speech.  He is alert and oriented.  Patient denies any SI/HI/AVH. A:Patient will be a 1:1 until discharge.  Safety checks continued per protocol.  Medications and discharge instructions to be reviewed with patient.  Return all belongings to patient. R: Patient's behavior has been appropriate.  He interacts well with staff and others on the milieu.    1400: patient discharge into the care of his son.  Power of attorney form was noterized for family member.  Patient received all belongings and left ambulatory without incident.

## 2013-01-21 NOTE — [facility] Suicide Risk Assessment (Signed)
Suicide Risk Assessment  Discharge Assessment     Demographic Factors:  Male, Age 67 or older and Caucasian  Mental Status Per Nursing Assessment::   On Admission:  NA  Current Mental Status by Physician: In full contact with reality. Denies suicidal ideas, plans or intern. His mood is euthymic, his affect is appropriate. He states he is looking forward to being discarged   Loss Factors: NA  Historical Factors: NA  Risk Reduction Factors:   Living with another person, especially a relative  Continued Clinical Symptoms: Bipolar Disorder   Cognitive Features That Contribute To Risk: None identified   Suicide Risk:  Minimal: No identifiable suicidal ideation.  Patients presenting with no risk factors but with morbid ruminations; may be classified as minimal risk based on the severity of the depressive symptoms  Discharge Diagnoses:   AXIS I:  Bipolar, Manic AXIS II:  Deferred AXIS III:   Past Medical History  Diagnosis Date  . Mental disorder   . PTSD (post-traumatic stress disorder)   . Bipolar 2 disorder   . Schizophrenia   . Hypertension   . High cholesterol   . Diabetes mellitus without complication    AXIS IV:  other psychosocial or environmental problems AXIS V:  61-70 mild symptoms  Plan Of Care/Follow-up recommendations:  Activity:  as tolerated Diet:  as per dietitian  Is patient on multiple antipsychotic therapies at discharge:  Yes Has Patient had three or more failed trials of antipsychotic monotherapy by history:  No  Recommended Plan for Multiple Antipsychotic Therapies: Taper to monotherapy as described:  taper down the Risperdal. optimize treatment with Seroquel  Meosha Castanon A 01/21/2013, 11:22 AM

## 2013-01-21 NOTE — Tx Team (Signed)
  Interdisciplinary Treatment Plan Update   Date Reviewed:  01/21/2013  Time Reviewed:  8:18 AM  Progress in Treatment:   Attending groups: Yes Participating in groups: Yes Taking medication as prescribed: Yes  Tolerating medication: Yes Family/Significant other contact made: Yes  Patient understands diagnosis: Yes  Discussing patient identified problems/goals with staff: Yes Medical problems stabilized or resolved: Yes Denies suicidal/homicidal ideation: Yes  In tx team Patient has not harmed self or others: Yes  For review of initial/current patient goals, please see plan of care.  Estimated Length of Stay:  D/C today  Reason for Continuation of Hospitalization:   New Problems/Goals identified:  N/A  Discharge Plan or Barriers:   ALF, ACT team  Additional Comments:  Attendees:  Signature:  01/21/2013 8:18 AM   Signature: Richelle Ito, LCSW 01/21/2013 8:18 AM  Signature: Verne Spurr, PA 01/21/2013 8:18 AM  Signature: Joslyn Devon, RN 01/21/2013 8:18 AM  Signature:  01/21/2013 8:18 AM  Signature:  01/21/2013 8:18 AM  Signature:   01/21/2013 8:18 AM  Signature:    Signature:    Signature:    Signature:    Signature:    Signature:      Scribe for Treatment Team:   Richelle Ito, LCSW  01/21/2013 8:18 AM

## 2013-01-21 NOTE — Progress Notes (Signed)
Patient ID: Albert Hess, male   DOB: Sep 13, 1946, 67 y.o.   MRN: [id number] 1:1 Nursing Note: The patient is resting in bed with eyes closed. No distress noted. 1:1 maintained for safety. Will continue to monitor.

## 2013-01-21 NOTE — Progress Notes (Signed)
[facility] Adult Case Management Discharge Plan :  Will you be returning to the same living situation after discharge: No. At discharge, do you have transportation home?:Yes,  family Do you have the ability to pay for your medications:Yes,  Insurance  Release of information consent forms completed and in the chart;  Patient's signature needed at discharge.  [name]: Follow-up Information   Follow up with [facility]. (Will see you the day after d/c from the hospital)       [name]:   Yes,  yes    Safety Planning and Suicide Prevention discussed:  Yes,  yes  Albert Hess 01/21/2013, 10:27 AM

## 2013-01-21 NOTE — Progress Notes (Signed)
Patient ID: Albert Hess, male   DOB: 01-13-1946, 67 y.o.   MRN: [id number] Late entry: 1:1 Nursing Note: The patient was resting in bed with eyes closed. No distress noted. For safety. Will continue to monitor.

## 2013-01-21 NOTE — Progress Notes (Signed)
Recreation Therapy Notes  Date: 05.05.2014       Time: 9:40am Location: [address] Dayroom      Group Topic/Focus: Self Expression  Participation Level: Active  Participation Quality: Appropriate  Affect: Flat  Cognitive: Oriented   Additional Comments: Activity: The faces of me; Explanation: Patients were asked to draw their face in two parts. One side of their face represented how they currently see themselves. The other side of the face is how they think they will see themselves upon discharge. Classical music was played in the background to enhance they therapeutic space. Patients were given the choice of pencil, color pencil or markers to complete their drawing.   Patient actively participated in group activity. Patient drew a face with sunglasses. Patient drew the mouth on the face in a twisted fashion. On the left side the corner of the mouth turns downward. On the right side the corner of the mouth turns upward. Patient shaded approximately 65% of the mouth purple. Patient drew teeth on the remaining 35%. Patient completed the drawing in neon green and pink marker. Patient stated he was being d/c between 12pm and 2pm today.   Patient appeared subdued and said very little, unlike other days patient has attended recreation therapy group sessions.   Patient drawing was labeled and placed in his paper medical chart.   Marykay Lex Shyonna Carlin, LRT/CTRS  Ione Sandusky L 01/21/2013 1:17 PM

## 2013-01-21 NOTE — Discharge Summary (Signed)
Physician Discharge Summary Note  Patient:  Albert Hess is an 67 y.o., male MRN:  [id number] DOB:  [birth year] Patient phone:  [phone] (home)  Patient address:   [address]    [place] [facility] [id number],   Date of Admission:  12/25/2012 Date of Discharge: 01/21/2013  Reason for Admission: Bipolar disorder most recent episode manic  Discharge Diagnoses: Principal Problem:   Bipolar I disorder, most recent episode (or current) manic Active Problems:   Hyponatremia   Diabetes   Psychogenic polydipsia   Ulcer of foot   Edema of both legs  Review of Systems  Constitutional: Negative.  Negative for fever, chills, weight loss, malaise/fatigue and diaphoresis.  HENT: Negative for congestion and sore throat.   Eyes: Negative for blurred vision, double vision and photophobia.  Respiratory: Negative for cough, shortness of breath and wheezing.   Cardiovascular: Negative for chest pain, palpitations and PND.  Gastrointestinal: Negative for heartburn, nausea, vomiting, abdominal pain, diarrhea and constipation.  Musculoskeletal: Negative for myalgias, joint pain and falls.  Neurological: Negative for dizziness, tingling, tremors, sensory change, speech change, focal weakness, seizures, loss of consciousness, weakness and headaches.  Endo/Heme/Allergies: Negative for polydipsia. Does not bruise/bleed easily.  Psychiatric/Behavioral: Negative for depression, suicidal ideas, hallucinations, memory loss and substance abuse. The patient is not nervous/anxious and does not have insomnia.   Discharge Diagnoses:  AXIS I: Bipolar, Manic  AXIS II: Deferred  AXIS III:  Past Medical History   Diagnosis  Date   .  Mental disorder    .  PTSD (post-traumatic stress disorder)    .  Bipolar 2 disorder    .  Schizophrenia    .  Hypertension    .  High cholesterol    .  Diabetes mellitus without complication     AXIS IV: other psychosocial or environmental problems  AXIS V: 61-70 mild  symptoms   Level of Care:  [facility]  Hospital Course:  The patient is a 67 year old male Albert Hess veteran with an honorable discharge who presented to the ED with a history of PTSD, Bipolar disorder, and Schizophrenia with symptoms of mood swings, lability, impulsiveness, psychosis, suicidal ideation with no plan, hypervigilent, flashbacks, grandiose delusions, stating that he "owned most of [place] and had plans to change the skyline soon."        Other symptoms included severe hyponatremia for which he was placed in patient on the medical floor for stabilization. His UDS was positive for cannabis. Upon medical stabilization Albert Hess was then transferred to [facility] for further care and treatment.  He continued to have grandiose affect, flight of ideas, impulsivity, mood lability, delusions, hallucinations auditory type, and paranoia. He was flirtatious with the male staff. He was focused on [place], Albert Hess and getting his [employer] benefits. Albert Hess was cooperative and redirectable, but was intrusive with poor boundaries. He was started on Risperdal and titrated upward to 2mg  po BID. Titration was slow due to his hyponatremia and psychogenic polydipsia. His sleep was poor but would improve as the medication was titrated upward. Sleep medication was trazodone and changed from prn to scheduled. His sleep pattern would improve after 1 day of medication increase, with small cognitive improvements that would decline the next day.       Albert Hess has a history of diabetes and a wound care consult was done due to his feet which had severe callus formation on both feet and some soft tissue necrosis on one toe. He again became hyponatremic  and was put on a fluid restriction protocol with Internal Medicine which required a 1:1 to supervise. He also developed severe edema in both lower extremities also followed by Internal Medicine.      Albert Hess's symptoms of mania improved steadily but he continued to remain disorganized and  confused with cognition only slightly improved related to sleep. The concern for dementia vs delirium was raised and further diagnostic studies were done including MRI of the brain. No acute changes were noted on the MRI. It was felt that he did have symptoms of dementia rather than delirium.  His symptoms continued to wax and wane, and it was felt by both the Internal Medicine team and Psychiatry that he would need either home health RN upon returning home or an assisted living facility due to his poor mental acuity and his chronic health problems. His family requested that he be placed on the [facility] list as he had been a patient there in the past. The paper work was completed as well as a Manufacturing systems engineer number requested as the treatment team felt that he would likely be able to be placed in an ALF before a bed would be available at [facility].      While waiting for placement the problem with sleep continued and a second antipsychotic was initiated to see if sleep could be corrected. He was started on Serouqel 25mg  at hs with good results.      The plan was to cross titrate Albert Hess upward on the Seroquel at bedtime while titrating downward on the Risperdal being mindful of serum sodium levels and blood sugars.  Albert Hess was accepted to an ALF [facility] in [place] and is ready to be discharged there. All felt that he has been at his baseline for sometime and agreed that he is stable for discharge.    Consults:  IM                    Wound Care nurse  Significant Diagnostic Studies:  CBC w diff, CMP, UDS, UA, BAL  Discharge Vitals:   Blood pressure 91/60, pulse 107, temperature 96.9 F (36.1 C), temperature source Oral, resp. rate 18, height 5' 10.25" (1.784 m), weight 99.338 kg (219 lb). Body mass index is 31.21 kg/(m^2). Lab Results:   Results for orders placed during the hospital encounter of 12/25/12 (from the past 72 hour(s))  GLUCOSE, CAPILLARY     Status: Abnormal   Collection Time    01/18/13  5:21 PM       Result Value Range   Glucose-Capillary 131 (*) 70 - 99 mg/dL  GLUCOSE, CAPILLARY     Status: Abnormal   Collection Time    01/19/13  6:08 AM      Result Value Range   Glucose-Capillary 107 (*) 70 - 99 mg/dL  GLUCOSE, CAPILLARY     Status: None   Collection Time    01/19/13 11:56 AM      Result Value Range   Glucose-Capillary 84  70 - 99 mg/dL   Comment 1 Documented in Chart     Comment 2 Notify RN    GLUCOSE, CAPILLARY     Status: Abnormal   Collection Time    01/19/13  5:09 PM      Result Value Range   Glucose-Capillary 109 (*) 70 - 99 mg/dL   Comment 1 Documented in Chart     Comment 2 Notify RN    GLUCOSE, CAPILLARY     Status: Abnormal  Collection Time    01/19/13  9:11 PM      Result Value Range   Glucose-Capillary 123 (*) 70 - 99 mg/dL  GLUCOSE, CAPILLARY     Status: None   Collection Time    01/20/13  6:09 AM      Result Value Range   Glucose-Capillary 98  70 - 99 mg/dL  GLUCOSE, CAPILLARY     Status: None   Collection Time    01/20/13 12:07 PM      Result Value Range   Glucose-Capillary 93  70 - 99 mg/dL  GLUCOSE, CAPILLARY     Status: Abnormal   Collection Time    01/20/13  5:08 PM      Result Value Range   Glucose-Capillary 114 (*) 70 - 99 mg/dL  GLUCOSE, CAPILLARY     Status: None   Collection Time    01/21/13  6:12 AM      Result Value Range   Glucose-Capillary 86  70 - 99 mg/dL    Physical Findings: AIMS: Facial and Oral Movements Muscles of Facial Expression: None, normal Lips and Perioral Area: None, normal Jaw: None, normal Tongue: None, normal,Extremity Movements Upper (arms, wrists, hands, fingers): None, normal Lower (legs, knees, ankles, toes): None, normal, Trunk Movements Neck, shoulders, hips: None, normal, Overall Severity Severity of abnormal movements (highest score from questions above): None, normal Incapacitation due to abnormal movements: None, normal Patient's awareness of abnormal movements (rate only patient's report): No  Awareness, Dental Status Current problems with teeth and/or dentures?: No Does patient usually wear dentures?: No  CIWA:  CIWA-Ar Total: 4 COWS:  COWS Total Score: 1  Psychiatric Specialty Exam: See Psychiatric Specialty Exam and Suicide Risk Assessment completed by Attending Physician prior to discharge.  Discharge destination:  [facility]  Is patient on multiple antipsychotic therapies at discharge:  Yes,   Do you recommend tapering to monotherapy for antipsychotics? Yes  Has Patient had three or more failed trials of antipsychotic monotherapy by history:  No  Recommended Plan for Multiple Antipsychotic Therapies: Patient's medications are in the process of a cross-taper;  medications include:  risperdal is in process of cross taper to discontinue with Seroquel. It is recommended that the risperdal be discontinued slowly to avoid the risk of the patient becoming manic again.  It is also recommended that the Seroquel be left at the lowest dose possible to avoid increasing the blood sugars and again to avoid hyponatremia.  Discharge Orders   Future Orders Complete By Expires     Diet - low sodium heart healthy  As directed     Discharge instructions  As directed     Comments:      Take all of your medications as directed. Be sure to keep all of your follow up appointments.  If you are unable to keep your follow up appointment, call your Doctor's office to let them know, and reschedule.  Make sure that you have enough medication to last until your appointment. Be sure to get plenty of rest. Going to bed at the same time each night will help. Try to avoid sleeping during the day.  Increase your activity as tolerated. Regular exercise will help you to sleep better and improve your mental health. Eating a heart healthy diet is recommended. Try to avoid salty or fried foods. Be sure to avoid all alcohol and illegal drugs.    Increase activity slowly  As directed         Medication List  TAKE  these medications     Indication   furosemide 40 MG tablet  Commonly known as:  LASIX  Take 1 tablet (40 mg total) by mouth daily. For edema.   Indication:  Edema     lisinopril 10 MG tablet  Commonly known as:  PRINIVIL,ZESTRIL  Take 1 tablet (10 mg total) by mouth daily. For hypertension.   Indication:  High Blood Pressure     metFORMIN 500 MG tablet  Commonly known as:  GLUCOPHAGE  Take 1 tablet (500 mg total) by mouth daily with breakfast. For glycemic control.   Indication:  Type 2 Diabetes     pravastatin 40 MG tablet  Commonly known as:  PRAVACHOL  Take 1 tablet (40 mg total) by mouth daily. For cholesterol.   Indication:  Inherited Heterozygous Hypercholesterolemia     QUEtiapine 50 MG tablet  Commonly known as:  SEROQUEL  Take 2 tablets at bedtime for sleep.   Indication:  Trouble Sleeping     ramelteon 8 MG tablet  Commonly known as:  ROZEREM  Take 1 tablet (8 mg total) by mouth at bedtime. For insomnia.   Indication:  Trouble Sleeping     risperiDONE 1 MG tablet  Commonly known as:  RISPERDAL  Take 1 tablet (1 mg total) by mouth 2 (two) times daily. For mental clarity.   Indication: mania      traZODone 100 MG tablet  Commonly known as:  DESYREL  Take 2 tablets (200 mg total) by mouth at bedtime.   Indication:  Insomnia           Follow-up Information   Follow up with [facility]. (Will see you the day after d/c from the hospital)    Contact information:   [address]  [address]  [place]  [phone]      Follow-up recommendations:   Activities: Resume activity as tolerated. Diet: Heart healthy low sodium diet Tests: Follow up testing will be determined by your out patient provider. Follow the plan of decreasing the Risperdal and adjusting the Seroquel as decided by your outpatient provider Comments:   Donnavin was admitted for hyponatremia due to psychogenic polydipsia. It is recommended that any antipsychotic be used at the lowest effective  dose to avoid hyponatremia. Patient will also need regular CBGs to monitor blood sugar. Patient will also need to be monitored for fluid intake in excess of per day. Patient will also need follow up for foot care due to diabetes, poor circulation, and current foot ulcer.  Total Discharge Time:  Greater than 30 minutes.  Signed: [name]. [place]burn RPAC 11:45 AM 01/21/2013

## 2013-01-24 NOTE — Progress Notes (Signed)
Patient Discharge Instructions:  After Visit Summary (AVS):   Faxed to:  01/24/13 Discharge Summary Note:   Faxed to:  01/24/13 Psychiatric Admission Assessment Note:   Faxed to:  01/24/13 Suicide Risk Assessment - Discharge Assessment:   Faxed to:  01/24/13 Faxed/Sent to the Next Level Care provider:  01/24/13 Faxed to [facility] @ [phone]  Jerelene Redden, 01/24/2013, 3:44 PM

## 2013-03-16 ENCOUNTER — Emergency Department (HOSPITAL_COMMUNITY): Payer: Medicare Other

## 2013-03-16 ENCOUNTER — Encounter (HOSPITAL_COMMUNITY): Payer: Self-pay | Admitting: *Deleted

## 2013-03-16 ENCOUNTER — Inpatient Hospital Stay (HOSPITAL_COMMUNITY)
Admission: EM | Admit: 2013-03-16 | Discharge: 2013-03-18 | DRG: 683 | Disposition: A | Discharge status: Nursing Home (Includes ICF) | Payer: Medicare Other | Attending: Internal Medicine | Admitting: Internal Medicine

## 2013-03-16 DIAGNOSIS — R404 Transient alteration of awareness: Secondary | ICD-10-CM

## 2013-03-16 DIAGNOSIS — D649 Anemia, unspecified: Secondary | ICD-10-CM

## 2013-03-16 DIAGNOSIS — S0100XA Unspecified open wound of scalp, initial encounter: Secondary | ICD-10-CM | POA: Diagnosis present

## 2013-03-16 DIAGNOSIS — F17200 Nicotine dependence, unspecified, uncomplicated: Secondary | ICD-10-CM | POA: Diagnosis present

## 2013-03-16 DIAGNOSIS — E7800 Pure hypercholesterolemia, unspecified: Secondary | ICD-10-CM | POA: Diagnosis present

## 2013-03-16 DIAGNOSIS — E860 Dehydration: Secondary | ICD-10-CM | POA: Diagnosis present

## 2013-03-16 DIAGNOSIS — N179 Acute kidney failure, unspecified: Secondary | ICD-10-CM

## 2013-03-16 DIAGNOSIS — Z79899 Other long term (current) drug therapy: Secondary | ICD-10-CM

## 2013-03-16 DIAGNOSIS — N289 Disorder of kidney and ureter, unspecified: Secondary | ICD-10-CM

## 2013-03-16 DIAGNOSIS — R55 Syncope and collapse: Secondary | ICD-10-CM

## 2013-03-16 DIAGNOSIS — R600 Localized edema: Secondary | ICD-10-CM

## 2013-03-16 DIAGNOSIS — IMO0002 Reserved for concepts with insufficient information to code with codable children: Secondary | ICD-10-CM

## 2013-03-16 DIAGNOSIS — L97509 Non-pressure chronic ulcer of other part of unspecified foot with unspecified severity: Secondary | ICD-10-CM

## 2013-03-16 DIAGNOSIS — E871 Hypo-osmolality and hyponatremia: Secondary | ICD-10-CM

## 2013-03-16 DIAGNOSIS — E119 Type 2 diabetes mellitus without complications: Secondary | ICD-10-CM | POA: Diagnosis present

## 2013-03-16 DIAGNOSIS — W19XXXA Unspecified fall, initial encounter: Secondary | ICD-10-CM | POA: Diagnosis present

## 2013-03-16 DIAGNOSIS — I10 Essential (primary) hypertension: Secondary | ICD-10-CM | POA: Diagnosis present

## 2013-03-16 DIAGNOSIS — F3110 Bipolar disorder, current episode manic without psychotic features, unspecified: Secondary | ICD-10-CM

## 2013-03-16 DIAGNOSIS — F319 Bipolar disorder, unspecified: Secondary | ICD-10-CM

## 2013-03-16 DIAGNOSIS — I959 Hypotension, unspecified: Secondary | ICD-10-CM | POA: Diagnosis present

## 2013-03-16 DIAGNOSIS — F4310 Post-traumatic stress disorder, unspecified: Secondary | ICD-10-CM | POA: Diagnosis present

## 2013-03-16 DIAGNOSIS — F54 Psychological and behavioral factors associated with disorders or diseases classified elsewhere: Secondary | ICD-10-CM

## 2013-03-16 DIAGNOSIS — D696 Thrombocytopenia, unspecified: Secondary | ICD-10-CM | POA: Diagnosis present

## 2013-03-16 DIAGNOSIS — T502X5A Adverse effect of carbonic-anhydrase inhibitors, benzothiadiazides and other diuretics, initial encounter: Secondary | ICD-10-CM | POA: Diagnosis present

## 2013-03-16 DIAGNOSIS — F209 Schizophrenia, unspecified: Secondary | ICD-10-CM | POA: Diagnosis present

## 2013-03-16 DIAGNOSIS — F3189 Other bipolar disorder: Secondary | ICD-10-CM | POA: Diagnosis present

## 2013-03-16 HISTORY — DX: Anemia, unspecified: D64.9

## 2013-03-16 HISTORY — DX: Acute kidney failure, unspecified: N17.9

## 2013-03-16 LAB — IRON AND TIBC
Iron: 56 ug/dL (ref 42–135)
TIBC: 209 ug/dL — ABNORMAL LOW (ref 215–435)

## 2013-03-16 LAB — COMPREHENSIVE METABOLIC PANEL
BUN: 53 mg/dL — ABNORMAL HIGH (ref 6–23)
Calcium: 8.4 mg/dL (ref 8.4–10.5)
Creatinine, Ser: 1.89 mg/dL — ABNORMAL HIGH (ref 0.50–1.35)
GFR calc Af Amer: 41 mL/min — ABNORMAL LOW (ref >90)
Glucose, Bld: 110 mg/dL — ABNORMAL HIGH (ref 70–99)
Sodium: 134 mEq/L — ABNORMAL LOW (ref 135–145)
Total Protein: 5.9 g/dL — ABNORMAL LOW (ref 6.0–8.3)

## 2013-03-16 LAB — VITAMIN B12: Vitamin B-12: 454 pg/mL (ref 211–911)

## 2013-03-16 LAB — RETICULOCYTES: Retic Count, Absolute: 50.2 10*3/uL (ref 19.0–186.0)

## 2013-03-16 LAB — POCT I-STAT, CHEM 8
BUN: 51 mg/dL — ABNORMAL HIGH (ref 6–23)
Calcium, Ion: 1.06 mmol/L — ABNORMAL LOW (ref 1.13–1.30)
Creatinine, Ser: 2.1 mg/dL — ABNORMAL HIGH (ref 0.50–1.35)
Glucose, Bld: 107 mg/dL — ABNORMAL HIGH (ref 70–99)
TCO2: 26 mmol/L (ref 0–100)

## 2013-03-16 LAB — GLUCOSE, CAPILLARY
Glucose-Capillary: 137 mg/dL — ABNORMAL HIGH (ref 70–99)
Glucose-Capillary: 79 mg/dL (ref 70–99)
Glucose-Capillary: 114 mg/dL — ABNORMAL HIGH (ref 70–99)
Glucose-Capillary: 90 mg/dL (ref 70–99)

## 2013-03-16 LAB — CBC
HCT: 27.9 % — ABNORMAL LOW (ref 39.0–52.0)
Hemoglobin: 9.9 g/dL — ABNORMAL LOW (ref 13.0–17.0)
MCHC: 35.5 g/dL (ref 30.0–36.0)
MCV: 94.3 fL (ref 78.0–100.0)

## 2013-03-16 LAB — FOLATE: Folate: 14.4 ng/mL

## 2013-03-16 LAB — FERRITIN: Ferritin: 193 ng/mL (ref 22–322)

## 2013-03-16 LAB — MRSA PCR SCREENING: MRSA by PCR: NEGATIVE

## 2013-03-16 LAB — POCT I-STAT TROPONIN I

## 2013-03-16 MED ORDER — RAMELTEON 8 MG PO TABS
8.0000 mg | ORAL_TABLET | Freq: Every day | ORAL | Status: DC
  Administered 2013-03-16 – 2013-03-17 (×2): 8 mg via ORAL
  Filled 2013-03-16 (×3): qty 1

## 2013-03-16 MED ORDER — SODIUM CHLORIDE 0.9 % IV BOLUS (SEPSIS)
1000.0000 mL | Freq: Once | INTRAVENOUS | Status: AC
  Administered 2013-03-16: 1000 mL via INTRAVENOUS

## 2013-03-16 MED ORDER — SODIUM CHLORIDE 0.9 % IV SOLN
INTRAVENOUS | Status: DC
  Administered 2013-03-16 – 2013-03-17 (×4): via INTRAVENOUS

## 2013-03-16 MED ORDER — RISPERIDONE 1 MG PO TABS
1.0000 mg | ORAL_TABLET | Freq: Two times a day (BID) | ORAL | Status: DC
  Administered 2013-03-16 – 2013-03-18 (×5): 1 mg via ORAL
  Filled 2013-03-16 (×6): qty 1

## 2013-03-16 MED ORDER — TRAZODONE HCL 100 MG PO TABS
200.0000 mg | ORAL_TABLET | Freq: Every day | ORAL | Status: DC
  Administered 2013-03-16 – 2013-03-17 (×2): 200 mg via ORAL
  Filled 2013-03-16 (×3): qty 2

## 2013-03-16 MED ORDER — SIMVASTATIN 20 MG PO TABS
20.0000 mg | ORAL_TABLET | Freq: Every day | ORAL | Status: DC
  Administered 2013-03-16 – 2013-03-17 (×2): 20 mg via ORAL
  Filled 2013-03-16 (×3): qty 1

## 2013-03-16 MED ORDER — INSULIN ASPART 100 UNIT/ML ~~LOC~~ SOLN
0.0000 [IU] | Freq: Three times a day (TID) | SUBCUTANEOUS | Status: DC
  Administered 2013-03-16: 3 [IU] via SUBCUTANEOUS

## 2013-03-16 MED ORDER — MAGNESIUM HYDROXIDE 400 MG/5ML PO SUSP: 30.0000 mL | Freq: Every day | ORAL | Status: DC | PRN

## 2013-03-16 MED ORDER — HEPARIN SODIUM (PORCINE) 5000 UNIT/ML IJ SOLN
5000.0000 [IU] | Freq: Three times a day (TID) | INTRAMUSCULAR | Status: DC
  Administered 2013-03-16 – 2013-03-17 (×3): 5000 [IU] via SUBCUTANEOUS
  Filled 2013-03-16 (×6): qty 1

## 2013-03-16 MED ORDER — QUETIAPINE FUMARATE 100 MG PO TABS
100.0000 mg | ORAL_TABLET | Freq: Every day | ORAL | Status: DC
  Administered 2013-03-16 – 2013-03-17 (×2): 100 mg via ORAL
  Filled 2013-03-16 (×3): qty 1

## 2013-03-16 MED ORDER — MUPIROCIN 2 % EX OINT
1.0000 "application " | TOPICAL_OINTMENT | Freq: Two times a day (BID) | CUTANEOUS | Status: DC
  Administered 2013-03-16 – 2013-03-18 (×5): 1 via TOPICAL
  Filled 2013-03-16: qty 22

## 2013-03-16 NOTE — ED Notes (Signed)
Pt stated unable to go to restroom for a urine specimen, at this time. Tech informed pt. To call for assist, when pt is able to give an urine specimen. Tech reported to Charity fundraiser in charge.

## 2013-03-16 NOTE — ED Notes (Signed)
Pt from [facility]. Reports not eating or drinking fluids for 4 days. Finally told staff and received two rounds of laxatives. Had two large, normal formed bowel movements. When [facility] arrived, pt was hypotensive and passed out when orthostatic vitals were taken. Pt alert and oriented x 3, neuro intact. Received 1L of NS bolus via en route to ER.

## 2013-03-16 NOTE — Progress Notes (Addendum)
[facility] PROGRESS NOTE Assessment/Plan: Dehydration - IV fluids. - b-met in am  AKI (acute kidney injury) - Baseline Cr. 1.0. Increasing from admission 2.1, with IV fluids. - cont IV fluids, check Abd [place] rule obstruction. - most likely due to lasix and ACE. - hold metformin   Anemia: - baseline hbg 11.2. - Hbg expected to drop, monitor CBC. - check anemia panel, FOBT  Thrombocytopenia: - Unclear etiology, not on any medication that would cause thrombocytopenia. - no rashes or petechia.  Diabetes - hold metformin   Code Status: full Family Communication: none  Disposition Plan: inpateint   Consultants:  none  Procedures:  Abd u/s  Antibiotics:  None  HPI/Subjective: No complains, no rashes petechia joint swelling.  Objective: Filed Vitals:   03/16/13 0530 03/16/13 0545 03/16/13 0600 03/16/13 0652  BP: 131/67 118/68 122/69 120/70  Pulse: 75 73 79 84  Temp:    98.1 F (36.7 C)  TempSrc:    Oral  Resp:    22  Height:    6\' 1"  (1.854 m)  Weight:    97.9 kg (215 lb 13.3 oz)  SpO2: 90% 100% 99% 96%   No intake or output data in the 24 hours ending 03/16/13 0742 Filed Weights   03/16/13 0652  Weight: 97.9 kg (215 lb 13.3 oz)    Exam:  General: Alert, awake, oriented x3, in no acute distress.  HEENT: No bruits, no goiter.  Heart: Regular rate and rhythm, without murmurs, rubs, gallops.  Lungs: Good air movement, clear to auscultation.  Abdomen: Soft, nontender, nondistended, positive bowel sounds.  Neuro: Grossly intact, nonfocal.   Data Reviewed: Basic Metabolic Panel:  Recent Labs Lab 03/16/13 0207 03/16/13 0215  NA 134* 136  K 4.6 4.5  CL 99 98  CO2 26  --   GLUCOSE 110* 107*  BUN 53* 51*  CREATININE 1.89* 2.10*  CALCIUM 8.4  --    Liver Function Tests:  Recent Labs Lab 03/16/13 0207  AST 15  ALT 10  ALKPHOS 50  BILITOT 0.4  PROT 5.9*  ALBUMIN 3.3*   No results found for this basename: LIPASE, AMYLASE,  in the  last 168 hours No results found for this basename: AMMONIA,  in the last 168 hours CBC:  Recent Labs Lab 03/16/13 0215 03/16/13 0249  WBC  --  4.6  HGB 9.2* 9.9*  HCT 27.0* 27.9*  MCV  --  94.3  PLT  --  93*   Cardiac Enzymes: No results found for this basename: CKTOTAL, CKMB, CKMBINDEX, TROPONINI,  in the last 168 hours BNP (last 3 results) No results found for this basename: PROBNP,  in the last 8760 hours CBG: No results found for this basename: GLUCAP,  in the last 168 hours  No results found for this or any previous visit (from the past 240 hour(s)).   Studies: Ct Head Wo Contrast  03/16/2013   *RADIOLOGY REPORT*  Clinical Data:   Syncope.  Trauma to back of head.  CT HEAD WITHOUT CONTRAST  Technique: Contiguous axial images were obtained from the base of the skull through the vertex without contrast  Comparison: Brain MR 01/07/2013.  Plain film cervical spine 01/28/2008.  Findings:  Bone windows demonstrate soft tissue swelling about the occipital scalp, including on image 38/series 3.  This is relatively mild.  No underlying calvarial fracture. Clear paranasal sinuses and mastoid air cells.  Soft tissue windows demonstrate mild low density in the periventricular white matter likely related to small  vessel disease. No  mass lesion, hemorrhage, hydrocephalus, acute infarct, intra-axial, or extra-axial fluid collection.  IMPRESSION:  1.  Posterior scalp soft tissue swelling, without acute intracranial abnormality. 2.  Mild small vessel ischemic change.  CT CERVICAL SPINE WITHOUT CONTRAST  Technique: Continous axial images were obtained of the cervical spine without contrast.  Sagittal and coronal reformats were constructed.  Findings:  Spinal visualization through mid T2 level.  Prevertebral soft tissues are within normal limits.  No apical pneumothorax. Spondylosis.  Multilevel neural foraminal narrowing.  Central canal stenosis at C5-6 and C6-C7. Skull base intact.  Marked facet  arthropathy bilaterally.  A hemangioma within the T2 vertebral body.  Straightening of expected cervical lordosis. Facets are well- aligned.  Coronal reformats demonstrate a normal C1-C2 articulation.  IMPRESSION:  1.  No acute fracture or subluxation. 2.  Advance spondylosis, including areas of central canal and neural foraminal narrowing. 3. Straightening of expected cervical lordosis could be positional, due to muscular spasm, or ligamentous injury.   Original Report Authenticated By: Jeronimo Greaves, M.D.   Ct Cervical Spine Wo Contrast  03/16/2013   *RADIOLOGY REPORT*  Clinical Data:   Syncope.  Trauma to back of head.  CT HEAD WITHOUT CONTRAST  Technique: Contiguous axial images were obtained from the base of the skull through the vertex without contrast  Comparison: Brain MR 01/07/2013.  Plain film cervical spine 01/28/2008.  Findings:  Bone windows demonstrate soft tissue swelling about the occipital scalp, including on image 38/series 3.  This is relatively mild.  No underlying calvarial fracture. Clear paranasal sinuses and mastoid air cells.  Soft tissue windows demonstrate mild low density in the periventricular white matter likely related to small vessel disease. No  mass lesion, hemorrhage, hydrocephalus, acute infarct, intra-axial, or extra-axial fluid collection.  IMPRESSION:  1.  Posterior scalp soft tissue swelling, without acute intracranial abnormality. 2.  Mild small vessel ischemic change.  CT CERVICAL SPINE WITHOUT CONTRAST  Technique: Continous axial images were obtained of the cervical spine without contrast.  Sagittal and coronal reformats were constructed.  Findings:  Spinal visualization through mid T2 level.  Prevertebral soft tissues are within normal limits.  No apical pneumothorax. Spondylosis.  Multilevel neural foraminal narrowing.  Central canal stenosis at C5-6 and C6-C7. Skull base intact.  Marked facet arthropathy bilaterally.  A hemangioma within the T2 vertebral body.   Straightening of expected cervical lordosis. Facets are well- aligned.  Coronal reformats demonstrate a normal C1-C2 articulation.  IMPRESSION:  1.  No acute fracture or subluxation. 2.  Advance spondylosis, including areas of central canal and neural foraminal narrowing. 3. Straightening of expected cervical lordosis could be positional, due to muscular spasm, or ligamentous injury.   Original Report Authenticated By: Jeronimo Greaves, M.D.   Dg Chest Portable 1 View  03/16/2013   *RADIOLOGY REPORT*  Clinical Data: Near syncope.  PORTABLE CHEST - 1 VIEW  Comparison: Report of 04/02/2011  Findings: Numerous leads and wires project over the chest.  Midline trachea.  Normal heart size.  No pleural effusion or pneumothorax. Biapical pleural thickening.  A thin-walled cavitary lesion projects over the right midlung and measures 4.4 cm.  A 3 cm lesion was described projecting within the right middle lobe on 04/02/2011.  Left lung clear.  IMPRESSION: No acute findings.  A 4.4 cm thin-walled cavitary lesion projecting over the right midlung. Per a report of nearly 12 years ago, a 3.0 cm cavitary lesion is described.  Presuming this is the same lesion, this would  suggest an indolent, likely infectious etiology.  Comparison with more recent radiographs or non emergent outpatient chest CT suggested.   Original Report Authenticated By: Jeronimo Greaves, M.D.    Scheduled Meds: . insulin aspart  0-15 Units Subcutaneous TID WC  . mupirocin ointment  1 application Topical BID  . QUEtiapine  100 mg Oral QHS  . ramelteon  8 mg Oral QHS  . risperiDONE  1 mg Oral BID  . simvastatin  20 mg Oral q1800  . traZODone  200 mg Oral QHS   Continuous Infusions: . sodium chloride 125 mL/hr at 03/16/13 0645     Marinda Elk  [facility] Pager [phone]. If 8PM-8AM, please contact night-coverage at [email or web address], password [facility] 03/16/2013, 7:42 AM  LOS: 0 days

## 2013-03-16 NOTE — ED Provider Notes (Signed)
History    CSN: [id number] Arrival date & time 03/16/13  0111  First MD Initiated Contact with Patient 03/16/13 0207     Chief Complaint  Patient presents with  . Near Syncope  . Hypotension   (Consider location/radiation/quality/duration/timing/severity/associated sxs/prior Treatment) HPI HX per PT - Resides at [facility], has DM and has been constipated the last 6 days, He has been taking laxatives and tonight started having BMs.  On the way to the bathroom had a syncopal event, struck the back of his head. Per EMS was hypotensive, received IVFs, no reported Sz activity. PT denies any recent illness otherwise, no F/C, no N/V or ABd pain. Has diabetic wound RLE that he reports is doing very well.   Past Medical History  Diagnosis Date  . Mental disorder   . PTSD (post-traumatic stress disorder)   . Bipolar 2 disorder   . Schizophrenia   . Hypertension   . High cholesterol   . Diabetes mellitus without complication    Past Surgical History  Procedure Laterality Date  . Right side      pt points to R side for surgery   No family history on file. History  Substance Use Topics  . Smoking status: Light Tobacco Smoker -- 0.00 packs/day    Types: Cigarettes  . Smokeless tobacco: Not on file  . Alcohol Use: Yes     Comment: 6 pkg weekly    Review of Systems  Constitutional: Negative for fever and chills.  HENT: Negative for neck pain.   Eyes: Negative for visual disturbance.  Respiratory: Negative for shortness of breath.   Cardiovascular: Negative for chest pain.  Gastrointestinal: Positive for constipation. Negative for vomiting.  Genitourinary: Negative for dysuria.  Musculoskeletal: Negative for back pain.  Skin: Positive for wound. Negative for rash.  Neurological: Positive for syncope. Negative for headaches.  All other systems reviewed and are negative.    Allergies  Review of patient's allergies indicates no known allergies.  Home Medications   Current  Outpatient Rx  Name  Route  Sig  Dispense  Refill  . furosemide (LASIX) 40 MG tablet   Oral   Take 1 tablet (40 mg total) by mouth daily. For edema.   30 tablet   0   . lisinopril (PRINIVIL,ZESTRIL) 10 MG tablet   Oral   Take 1 tablet (10 mg total) by mouth daily. For hypertension.   30 tablet   0   . magnesium hydroxide (MILK OF MAGNESIA) 400 MG/5ML suspension   Oral   Take 30 mLs by mouth daily as needed for constipation.         . metFORMIN (GLUCOPHAGE) 500 MG tablet   Oral   Take 1 tablet (500 mg total) by mouth daily with breakfast. For glycemic control.   30 tablet   0   . mupirocin ointment (BACTROBAN) 2 %   Topical   Apply 1 application topically 2 (two) times daily. Foot ulcer         . pravastatin (PRAVACHOL) 40 MG tablet   Oral   Take 1 tablet (40 mg total) by mouth daily. For cholesterol.   30 tablet   0   . QUEtiapine (SEROQUEL) 50 MG tablet      Take 2 tablets at bedtime for sleep.   60 tablet   0   . ramelteon (ROZEREM) 8 MG tablet   Oral   Take 1 tablet (8 mg total) by mouth at bedtime. For insomnia.  30 tablet   0   . risperiDONE (RISPERDAL) 1 MG tablet   Oral   Take 1 tablet (1 mg total) by mouth 2 (two) times daily. For mental clarity.   60 tablet   0   . sodium phosphate (FLEET) 7-19 GM/118ML ENEM   Rectal   Place 1 enema rectally daily as needed (severe constipation).         . traZODone (DESYREL) 100 MG tablet   Oral   Take 2 tablets (200 mg total) by mouth at bedtime.   60 tablet   0    BP 84/44  Pulse 87  Temp(Src) 98.1 F (36.7 C) (Oral)  Resp 18  SpO2 95% Physical Exam  Constitutional: He is oriented to person, place, and time. He appears well-developed and well-nourished.  HENT:  Head: Normocephalic.  2 cm linear lac posterior scalp, hemostatic. No bony deformity. Dry mm  Eyes: EOM are normal. Pupils are equal, round, and reactive to light. No scleral icterus.  Neck: Neck supple.  Cardiovascular: Normal  rate, regular rhythm and intact distal pulses.   Pulmonary/Chest: Effort normal and breath sounds normal. No respiratory distress.  Abdominal: Soft. Bowel sounds are normal. He exhibits no distension. There is no tenderness.  Musculoskeletal: Normal range of motion.  Neurological: He is alert and oriented to person, place, and time.  Skin: Skin is warm and dry.    ED Course  LACERATION REPAIR Date/Time: 03/16/2013 5:28 AM Performed by: Sunnie Nielsen Authorized by: Sunnie Nielsen Consent: Verbal consent obtained. Risks and benefits: risks, benefits and alternatives were discussed Consent given by: patient Patient understanding: patient states understanding of the procedure being performed Patient consent: the patient's understanding of the procedure matches consent given Procedure consent: procedure consent matches procedure scheduled Required items: required blood products, implants, devices, and special equipment available Patient identity confirmed: verbally with patient Time out: Immediately prior to procedure a "time out" was called to verify the correct patient, procedure, equipment, support staff and site/side marked as required. Body area: head/neck Location details: scalp Laceration length: 3 cm Tendon involvement: none Nerve involvement: none Anesthesia: local infiltration Local anesthetic: lidocaine 1% with epinephrine Anesthetic total: 2 ml Patient sedated: no Preparation: Patient was prepped and draped in the usual sterile fashion. Irrigation solution: saline Irrigation method: syringe Amount of cleaning: standard Skin closure: staples Number of sutures: 3 Approximation: close Approximation difficulty: simple Dressing: antibiotic ointment Patient tolerance: Patient tolerated the procedure well with no immediate complications.   (including critical care time)  Results for orders placed during the hospital encounter of 03/16/13  COMPREHENSIVE METABOLIC PANEL       Result Value Range   Sodium 134 (*) 135 - 145 mEq/L   Potassium 4.6  3.5 - 5.1 mEq/L   Chloride 99  96 - 112 mEq/L   CO2 26  19 - 32 mEq/L   Glucose, Bld 110 (*) 70 - 99 mg/dL   BUN 53 (*) 6 - 23 mg/dL   Creatinine, Ser 8.11 (*) 0.50 - 1.35 mg/dL   Calcium 8.4  8.4 - 91.4 mg/dL   Total Protein 5.9 (*) 6.0 - 8.3 g/dL   Albumin 3.3 (*) 3.5 - 5.2 g/dL   AST 15  0 - 37 U/L   ALT 10  0 - 53 U/L   Alkaline Phosphatase 50  39 - 117 U/L   Total Bilirubin 0.4  0.3 - 1.2 mg/dL   GFR calc non Af Amer 36 (*) >90 mL/min   GFR calc  Af Amer 41 (*) >90 mL/min  CBC      Result Value Range   WBC 4.6  4.0 - 10.5 K/uL   RBC 2.96 (*) 4.22 - 5.81 MIL/uL   Hemoglobin 9.9 (*) 13.0 - 17.0 g/dL   HCT 65.7 (*) 84.6 - 96.2 %   MCV 94.3  78.0 - 100.0 fL   MCH 33.4  26.0 - 34.0 pg   MCHC 35.5  30.0 - 36.0 g/dL   RDW 95.2  84.1 - 32.4 %   Platelets 93 (*) 150 - 400 K/uL  POCT I-STAT, CHEM 8      Result Value Range   Sodium 136  135 - 145 mEq/L   Potassium 4.5  3.5 - 5.1 mEq/L   Chloride 98  96 - 112 mEq/L   BUN 51 (*) 6 - 23 mg/dL   Creatinine, Ser 4.01 (*) 0.50 - 1.35 mg/dL   Glucose, Bld 027 (*) 70 - 99 mg/dL   Calcium, Ion 2.53 (*) 1.13 - 1.30 mmol/L   TCO2 26  0 - 100 mmol/L   Hemoglobin 9.2 (*) 13.0 - 17.0 g/dL   HCT 66.4 (*) 40.3 - 47.4 %  POCT I-STAT TROPONIN I      Result Value Range   Troponin i, poc 0.01  0.00 - 0.08 ng/mL   Comment 3           CG4 I-STAT (LACTIC ACID)      Result Value Range   Lactic Acid, Venous 1.33  0.5 - 2.2 mmol/L   Ct Head Wo Contrast  03/16/2013   *RADIOLOGY REPORT*  Clinical Data:   Syncope.  Trauma to back of head.  CT HEAD WITHOUT CONTRAST  Technique: Contiguous axial images were obtained from the base of the skull through the vertex without contrast  Comparison: Brain MR 01/07/2013.  Plain film cervical spine 01/28/2008.  Findings:  Bone windows demonstrate soft tissue swelling about the occipital scalp, including on image 38/series 3.  This is relatively  mild.  No underlying calvarial fracture. Clear paranasal sinuses and mastoid air cells.  Soft tissue windows demonstrate mild low density in the periventricular white matter likely related to small vessel disease. No  mass lesion, hemorrhage, hydrocephalus, acute infarct, intra-axial, or extra-axial fluid collection.  IMPRESSION:  1.  Posterior scalp soft tissue swelling, without acute intracranial abnormality. 2.  Mild small vessel ischemic change.  CT CERVICAL SPINE WITHOUT CONTRAST  Technique: Continous axial images were obtained of the cervical spine without contrast.  Sagittal and coronal reformats were constructed.  Findings:  Spinal visualization through mid T2 level.  Prevertebral soft tissues are within normal limits.  No apical pneumothorax. Spondylosis.  Multilevel neural foraminal narrowing.  Central canal stenosis at C5-6 and C6-C7. Skull base intact.  Marked facet arthropathy bilaterally.  A hemangioma within the T2 vertebral body.  Straightening of expected cervical lordosis. Facets are well- aligned.  Coronal reformats demonstrate a normal C1-C2 articulation.  IMPRESSION:  1.  No acute fracture or subluxation. 2.  Advance spondylosis, including areas of central canal and neural foraminal narrowing. 3. Straightening of expected cervical lordosis could be positional, due to muscular spasm, or ligamentous injury.   Original Report Authenticated By: Jeronimo Greaves, M.D.   Ct Cervical Spine Wo Contrast  03/16/2013   *RADIOLOGY REPORT*  Clinical Data:   Syncope.  Trauma to back of head.  CT HEAD WITHOUT CONTRAST  Technique: Contiguous axial images were obtained from the base of the skull through the vertex  without contrast  Comparison: Brain MR 01/07/2013.  Plain film cervical spine 01/28/2008.  Findings:  Bone windows demonstrate soft tissue swelling about the occipital scalp, including on image 38/series 3.  This is relatively mild.  No underlying calvarial fracture. Clear paranasal sinuses and mastoid  air cells.  Soft tissue windows demonstrate mild low density in the periventricular white matter likely related to small vessel disease. No  mass lesion, hemorrhage, hydrocephalus, acute infarct, intra-axial, or extra-axial fluid collection.  IMPRESSION:  1.  Posterior scalp soft tissue swelling, without acute intracranial abnormality. 2.  Mild small vessel ischemic change.  CT CERVICAL SPINE WITHOUT CONTRAST  Technique: Continous axial images were obtained of the cervical spine without contrast.  Sagittal and coronal reformats were constructed.  Findings:  Spinal visualization through mid T2 level.  Prevertebral soft tissues are within normal limits.  No apical pneumothorax. Spondylosis.  Multilevel neural foraminal narrowing.  Central canal stenosis at C5-6 and C6-C7. Skull base intact.  Marked facet arthropathy bilaterally.  A hemangioma within the T2 vertebral body.  Straightening of expected cervical lordosis. Facets are well- aligned.  Coronal reformats demonstrate a normal C1-C2 articulation.  IMPRESSION:  1.  No acute fracture or subluxation. 2.  Advance spondylosis, including areas of central canal and neural foraminal narrowing. 3. Straightening of expected cervical lordosis could be positional, due to muscular spasm, or ligamentous injury.   Original Report Authenticated By: Jeronimo Greaves, M.D.   Dg Chest Portable 1 View  03/16/2013   *RADIOLOGY REPORT*  Clinical Data: Near syncope.  PORTABLE CHEST - 1 VIEW  Comparison: Report of 04/02/2011  Findings: Numerous leads and wires project over the chest.  Midline trachea.  Normal heart size.  No pleural effusion or pneumothorax. Biapical pleural thickening.  A thin-walled cavitary lesion projects over the right midlung and measures 4.4 cm.  A 3 cm lesion was described projecting within the right middle lobe on 04/02/2011.  Left lung clear.  IMPRESSION: No acute findings.  A 4.4 cm thin-walled cavitary lesion projecting over the right midlung. Per a report of  nearly 12 years ago, a 3.0 cm cavitary lesion is described.  Presuming this is the same lesion, this would suggest an indolent, likely infectious etiology.  Comparison with more recent radiographs or non emergent outpatient chest CT suggested.   Original Report Authenticated By: Jeronimo Greaves, M.D.   CRITICAL CARE Performed by: Sunnie Nielsen Total critical care time: 30 Critical care time was exclusive of separately billable procedures and treating other patients. Critical care was necessary to treat or prevent imminent or life-threatening deterioration. Critical care was time spent personally by me on the following activities: development of treatment plan with patient and/or surrogate as well as nursing, discussions with consultants, evaluation of patient's response to treatment, examination of patient, obtaining history from patient or surrogate, ordering and performing treatments and interventions, ordering and review of laboratory studies, ordering and review of radiographic studies, pulse oximetry and re-evaluation of patient's condition.   Date: 03/16/2013  Rate: 89  Rhythm: normal sinus rhythm  QRS Axis: normal  Intervals: normal  ST/T Wave abnormalities: nonspecific ST changes  Conduction Disutrbances:none  Narrative Interpretation:   Old EKG Reviewed: none available  5:29 AM d/w Dr Julian Reil, he will admit  MDM  Syncope/ head lac. Hypotension/ ARI  IVF resuscitation  Wound repair  Labs reviewed  CXR and CT scans reviewed  MED admit   Sunnie Nielsen, MD 03/16/13 6578

## 2013-03-16 NOTE — H&P (Signed)
[facility] History and Physical  [name]:[id number] DOB: [birth year] DOA: 03/16/2013  Referring physician: ED PCP: Bradd Burner, PA-C  Chief Complaint: Syncope  HPI: Albert Hess is a 67 y.o. male with long psych history, who presents after not eating anything or drinking much these past 4 days.  He states he wasn't eating anything because he was constipated and "figured that if nothing was coming out he didn't want to put anything in".  He states he thought he was drinking enough water but now "i guess I wasn't".  After staff at [facility] care found out about the constipation they gave him 2 doses of laxatives and tonight he had 2 large formed BMs.  On the way to the bathroom he had a syncopal event and struck the back of his head.  He was initially hypotensive in the ED, this improved significantly with 2L of IVF.  His labs also showed him to be in AKI.  Hospitalist has been asked to admit.  Review of Systems: 12 systems reviewed and otherwise negative.  Past Medical History  Diagnosis Date  . Mental disorder   . PTSD (post-traumatic stress disorder)   . Bipolar 2 disorder   . Schizophrenia   . Hypertension   . High cholesterol   . Diabetes mellitus without complication    Past Surgical History  Procedure Laterality Date  . Right side      pt points to R side for surgery   Social History:  reports that he has been smoking Cigarettes.  He has been smoking about 0.00 packs per day. He does not have any smokeless tobacco history on file. He reports that  drinks alcohol. He reports that he uses illicit drugs (Marijuana).   No Known Allergies  No family history on file.  Prior to Admission medications   Medication Sig Start Date End Date Taking? Authorizing Provider  furosemide (LASIX) 40 MG tablet Take 1 tablet (40 mg total) by mouth daily. For edema. 01/21/13  Yes Verne Spurr, PA-C  lisinopril (PRINIVIL,ZESTRIL) 10 MG tablet Take 1 tablet (10 mg total) by  mouth daily. For hypertension. 01/21/13  Yes Verne Spurr, PA-C  magnesium hydroxide (MILK OF MAGNESIA) 400 MG/5ML suspension Take 30 mLs by mouth daily as needed for constipation.   Yes Historical Provider, MD  metFORMIN (GLUCOPHAGE) 500 MG tablet Take 1 tablet (500 mg total) by mouth daily with breakfast. For glycemic control. 01/21/13  Yes Verne Spurr, PA-C  mupirocin ointment (BACTROBAN) 2 % Apply 1 application topically 2 (two) times daily. Foot ulcer   Yes Historical Provider, MD  pravastatin (PRAVACHOL) 40 MG tablet Take 1 tablet (40 mg total) by mouth daily. For cholesterol. 01/21/13  Yes Verne Spurr, PA-C  QUEtiapine (SEROQUEL) 50 MG tablet Take 2 tablets at bedtime for sleep. 01/21/13  Yes Verne Spurr, PA-C  ramelteon (ROZEREM) 8 MG tablet Take 1 tablet (8 mg total) by mouth at bedtime. For insomnia. 01/21/13  Yes Verne Spurr, PA-C  risperiDONE (RISPERDAL) 1 MG tablet Take 1 tablet (1 mg total) by mouth 2 (two) times daily. For mental clarity. 01/21/13  Yes Verne Spurr, PA-C  sodium phosphate (FLEET) 7-19 GM/118ML ENEM Place 1 enema rectally daily as needed (severe constipation).   Yes Historical Provider, MD  traZODone (DESYREL) 100 MG tablet Take 2 tablets (200 mg total) by mouth at bedtime. 01/21/13  Yes Verne Spurr, PA-C   Physical Exam: Filed Vitals:   03/16/13 0500 03/16/13 0515 03/16/13 0530 03/16/13 0545  BP:  126/67 123/68 131/67 118/68  Pulse: 79 74 75 73  Temp:      TempSrc:      Resp:      SpO2: 99% 99% 90% 100%    General:  NAD, resting comfortably in bed, has posterior scalp lac Eyes: PEERLA EOMI ENT: mucous membranes moist Neck: supple w/o JVD Cardiovascular: RRR w/o MRG Respiratory: CTA B Abdomen: soft, nt, nd, bs+ Skin: no rash nor lesion Musculoskeletal: MAE, full ROM all 4 extremities Psychiatric: normal tone and affect Neurologic: AAOx3, grossly non-focal  Labs on Admission:  Basic Metabolic Panel:  Recent Labs Lab 03/16/13 0207 03/16/13 0215  NA  134* 136  K 4.6 4.5  CL 99 98  CO2 26  --   GLUCOSE 110* 107*  BUN 53* 51*  CREATININE 1.89* 2.10*  CALCIUM 8.4  --    Liver Function Tests:  Recent Labs Lab 03/16/13 0207  AST 15  ALT 10  ALKPHOS 50  BILITOT 0.4  PROT 5.9*  ALBUMIN 3.3*   No results found for this basename: LIPASE, AMYLASE,  in the last 168 hours No results found for this basename: AMMONIA,  in the last 168 hours CBC:  Recent Labs Lab 03/16/13 0215 03/16/13 0249  WBC  --  4.6  HGB 9.2* 9.9*  HCT 27.0* 27.9*  MCV  --  94.3  PLT  --  93*   Cardiac Enzymes: No results found for this basename: CKTOTAL, CKMB, CKMBINDEX, TROPONINI,  in the last 168 hours  BNP (last 3 results) No results found for this basename: PROBNP,  in the last 8760 hours CBG: No results found for this basename: GLUCAP,  in the last 168 hours  Radiological Exams on Admission: Ct Head Wo Contrast  03/16/2013   *RADIOLOGY REPORT*  Clinical Data:   Syncope.  Trauma to back of head.  CT HEAD WITHOUT CONTRAST  Technique: Contiguous axial images were obtained from the base of the skull through the vertex without contrast  Comparison: Brain MR 01/07/2013.  Plain film cervical spine 01/28/2008.  Findings:  Bone windows demonstrate soft tissue swelling about the occipital scalp, including on image 38/series 3.  This is relatively mild.  No underlying calvarial fracture. Clear paranasal sinuses and mastoid air cells.  Soft tissue windows demonstrate mild low density in the periventricular white matter likely related to small vessel disease. No  mass lesion, hemorrhage, hydrocephalus, acute infarct, intra-axial, or extra-axial fluid collection.  IMPRESSION:  1.  Posterior scalp soft tissue swelling, without acute intracranial abnormality. 2.  Mild small vessel ischemic change.  CT CERVICAL SPINE WITHOUT CONTRAST  Technique: Continous axial images were obtained of the cervical spine without contrast.  Sagittal and coronal reformats were constructed.   Findings:  Spinal visualization through mid T2 level.  Prevertebral soft tissues are within normal limits.  No apical pneumothorax. Spondylosis.  Multilevel neural foraminal narrowing.  Central canal stenosis at C5-6 and C6-C7. Skull base intact.  Marked facet arthropathy bilaterally.  A hemangioma within the T2 vertebral body.  Straightening of expected cervical lordosis. Facets are well- aligned.  Coronal reformats demonstrate a normal C1-C2 articulation.  IMPRESSION:  1.  No acute fracture or subluxation. 2.  Advance spondylosis, including areas of central canal and neural foraminal narrowing. 3. Straightening of expected cervical lordosis could be positional, due to muscular spasm, or ligamentous injury.   Original Report Authenticated By: Jeronimo Greaves, M.D.   Ct Cervical Spine Wo Contrast  03/16/2013   *RADIOLOGY REPORT*  Clinical Data:  Syncope.  Trauma to back of head.  CT HEAD WITHOUT CONTRAST  Technique: Contiguous axial images were obtained from the base of the skull through the vertex without contrast  Comparison: Brain MR 01/07/2013.  Plain film cervical spine 01/28/2008.  Findings:  Bone windows demonstrate soft tissue swelling about the occipital scalp, including on image 38/series 3.  This is relatively mild.  No underlying calvarial fracture. Clear paranasal sinuses and mastoid air cells.  Soft tissue windows demonstrate mild low density in the periventricular white matter likely related to small vessel disease. No  mass lesion, hemorrhage, hydrocephalus, acute infarct, intra-axial, or extra-axial fluid collection.  IMPRESSION:  1.  Posterior scalp soft tissue swelling, without acute intracranial abnormality. 2.  Mild small vessel ischemic change.  CT CERVICAL SPINE WITHOUT CONTRAST  Technique: Continous axial images were obtained of the cervical spine without contrast.  Sagittal and coronal reformats were constructed.  Findings:  Spinal visualization through mid T2 level.  Prevertebral soft  tissues are within normal limits.  No apical pneumothorax. Spondylosis.  Multilevel neural foraminal narrowing.  Central canal stenosis at C5-6 and C6-C7. Skull base intact.  Marked facet arthropathy bilaterally.  A hemangioma within the T2 vertebral body.  Straightening of expected cervical lordosis. Facets are well- aligned.  Coronal reformats demonstrate a normal C1-C2 articulation.  IMPRESSION:  1.  No acute fracture or subluxation. 2.  Advance spondylosis, including areas of central canal and neural foraminal narrowing. 3. Straightening of expected cervical lordosis could be positional, due to muscular spasm, or ligamentous injury.   Original Report Authenticated By: Jeronimo Greaves, M.D.   Dg Chest Portable 1 View  03/16/2013   *RADIOLOGY REPORT*  Clinical Data: Near syncope.  PORTABLE CHEST - 1 VIEW  Comparison: Report of 04/02/2011  Findings: Numerous leads and wires project over the chest.  Midline trachea.  Normal heart size.  No pleural effusion or pneumothorax. Biapical pleural thickening.  A thin-walled cavitary lesion projects over the right midlung and measures 4.4 cm.  A 3 cm lesion was described projecting within the right middle lobe on 04/02/2011.  Left lung clear.  IMPRESSION: No acute findings.  A 4.4 cm thin-walled cavitary lesion projecting over the right midlung. Per a report of nearly 12 years ago, a 3.0 cm cavitary lesion is described.  Presuming this is the same lesion, this would suggest an indolent, likely infectious etiology.  Comparison with more recent radiographs or non emergent outpatient chest CT suggested.   Original Report Authenticated By: Jeronimo Greaves, M.D.    EKG: Independently reviewed.  Assessment/Plan Principal Problem:   AKI (acute kidney injury) Active Problems:   Diabetes   Dehydration   Anemia   Thrombocytopenia   1. AKI secondary to dehydration - 2L IVF in ED, IVF going at 125 cc/hr on floor, strict intake and output, repeat labs tomorrow AM.  Holding  lasix and lisinopril as nephrotoxic agents for now. 2. DM2 - holding metformin, have him on med dose SSI AC/HS 3. Anemia - slightly worse than baseline with HGB of 9.9 in ED (about 1 gm below his baseline), possibly due to scalp lac, recheck CBC tomorrow AM. 4. Thrombocytopenia - new and very mild with 93k platelets, suspect consumption due to scalp lac, recheck CBC in AM.  Code Status: Full Code (must indicate code status--if unknown or must be presumed, indicate so) Family Communication: No family in room (indicate person spoken with, if applicable, with phone number if by telephone) Disposition Plan: Admit to obs (indicate anticipated LOS)  Time spent: 70 min  Stclair Szymborski M. [facility] Pager [phone]  If 7PM-7AM, please contact night-coverage [email or web address] Password [facility] 03/16/2013, 6:02 AM

## 2013-03-16 NOTE — ED Notes (Signed)
Report given to floor and pt transported to the floor with Tech. PT alert and in NAD at time of admission

## 2013-03-16 NOTE — Care Management Utilization Note (Signed)
Utilization review completed.  

## 2013-03-17 DIAGNOSIS — E119 Type 2 diabetes mellitus without complications: Secondary | ICD-10-CM

## 2013-03-17 LAB — BASIC METABOLIC PANEL
BUN: 26 mg/dL — ABNORMAL HIGH (ref 6–23)
Calcium: 8.3 mg/dL — ABNORMAL LOW (ref 8.4–10.5)
Chloride: 109 mEq/L (ref 96–112)
Creatinine, Ser: 1.1 mg/dL (ref 0.50–1.35)
GFR calc Af Amer: 73 mL/min — ABNORMAL LOW (ref >90)
GFR calc Af Amer: 79 mL/min — ABNORMAL LOW (ref >90)
GFR calc non Af Amer: 63 mL/min — ABNORMAL LOW (ref >90)
Potassium: 5 mEq/L (ref 3.5–5.1)
Sodium: 138 mEq/L (ref 135–145)

## 2013-03-17 LAB — CBC
HCT: 27 % — ABNORMAL LOW (ref 39.0–52.0)
Hemoglobin: 9.5 g/dL — ABNORMAL LOW (ref 13.0–17.0)
MCHC: 35.2 g/dL (ref 30.0–36.0)
RDW: 12 % (ref 11.5–15.5)
WBC: 2.3 10*3/uL — ABNORMAL LOW (ref 4.0–10.5)

## 2013-03-17 LAB — GLUCOSE, CAPILLARY
Glucose-Capillary: 87 mg/dL (ref 70–99)
Glucose-Capillary: 88 mg/dL (ref 70–99)

## 2013-03-17 MED ORDER — LISINOPRIL 10 MG PO TABS: 10.0000 mg | ORAL_TABLET | Freq: Every day | ORAL | Status: DC

## 2013-03-17 MED ORDER — FUROSEMIDE 40 MG PO TABS: 40.0000 mg | ORAL_TABLET | Freq: Every day | ORAL | Status: DC

## 2013-03-17 NOTE — Clinical Social Work Psychosocial (Signed)
Clinical Social Work Department BRIEF PSYCHOSOCIAL ASSESSMENT 03/17/2013  Patient:  Albert Hess, Albert Hess     Account Number:  [id number]     Admit date:  03/16/2013  Clinical Social Worker:  Etta Grandchild  Date/Time:  03/17/2013 02:00 PM  Referred by:  Physician  Date Referred:  03/17/2013 Referred for  ALF Placement   Other Referral:   n/a   Interview type:  Patient Other interview type:   n/a    PSYCHOSOCIAL DATA Living Status:  FACILITY Admitted from facility:  [facility] Level of care:  Assisted Living Primary support name:  unknown- patient states he has minimal contact with his son Primary support relationship to patient:  CHILD, ADULT Degree of support available:   unknown, possibly poor    CURRENT CONCERNS Current Concerns  Post-Acute Placement   Other Concerns:   n/a    SOCIAL WORK ASSESSMENT / PLAN CSW was referred for discharge planning.  Patient is medically stable and ready for discharge back to [facility].  CSW contacted ALF, and they will not accept patient until Monday.  CSW spoke with patient regarding plan for DC.  Patient is agreeable and had no questions or concerns.   Assessment/plan status:  Psychosocial Support/Ongoing Assessment of Needs Other assessment/ plan:   n/a   Information/referral to community resources:   CSW communicating with [facility].    PATIENT'S/FAMILY'S RESPONSE TO PLAN OF CARE: Patient ready to return to ALF.  Patient requested to not call his son for an update, but that his son will contact him soon.   Kathrin Penner, MSW, LCSW Clinical Social Worker Weekend coverage

## 2013-03-17 NOTE — Progress Notes (Signed)
[facility] PROGRESS NOTE Assessment/Plan: AKI (acute kidney injury) - Baseline Cr. 1.0. Increasing from admission 2.1, with IV fluids. - most likely due to lasix and ACE. Relates feels better - hold metformin -b-met pending.  Dehydration - IV fluids.   Anemia: - baseline hbg 11.2. - Hbg expected to drop, monitor CBC. - check anemia panel, FOBT  Thrombocytopenia: - Unclear etiology, not on any medication that would cause thrombocytopenia. - no rashes or petechia.  Diabetes - hold metformin   Code Status: full Family Communication: none  Disposition Plan: inpateint   Consultants:  none  Procedures:  Abd u/s  Antibiotics:  None  HPI/Subjective: Relates feels much better than yesterday.  Objective: Filed Vitals:   03/16/13 0652 03/16/13 1405 03/16/13 2200 03/17/13 0634  BP: 120/70 119/72 124/74 124/72  Pulse: 84 71 67 75  Temp: 98.1 F (36.7 C) 97.8 F (36.6 C) 98.6 F (37 C) 97.4 F (36.3 C)  TempSrc: Oral Oral Oral Oral  Resp: 22 18 20 18   Height: 6\' 1"  (1.854 m)     Weight: 97.9 kg (215 lb 13.3 oz)     SpO2: 96% 100% 99% 99%    Intake/Output Summary (Last 24 hours) at 03/17/13 7829 Last data filed at 03/17/13 5621  Gross per 24 hour  Intake 1702.5 ml  Output   2000 ml  Net -297.5 ml   Filed Weights   03/16/13 0652  Weight: 97.9 kg (215 lb 13.3 oz)    Exam:  General: Alert, awake, oriented x3, in no acute distress.  HEENT: No bruits, no goiter.  Heart: Regular rate and rhythm, without murmurs, rubs, gallops.  Lungs: Good air movement, clear to auscultation.  Abdomen: Soft, nontender, nondistended, positive bowel sounds.  Neuro: Grossly intact, nonfocal.   Data Reviewed: Basic Metabolic Panel:  Recent Labs Lab 03/16/13 0207 03/16/13 0215  NA 134* 136  K 4.6 4.5  CL 99 98  CO2 26  --   GLUCOSE 110* 107*  BUN 53* 51*  CREATININE 1.89* 2.10*  CALCIUM 8.4  --    Liver Function Tests:  Recent Labs Lab  03/16/13 0207  AST 15  ALT 10  ALKPHOS 50  BILITOT 0.4  PROT 5.9*  ALBUMIN 3.3*   No results found for this basename: LIPASE, AMYLASE,  in the last 168 hours No results found for this basename: AMMONIA,  in the last 168 hours CBC:  Recent Labs Lab 03/16/13 0215 03/16/13 0249  WBC  --  4.6  HGB 9.2* 9.9*  HCT 27.0* 27.9*  MCV  --  94.3  PLT  --  93*   Cardiac Enzymes: No results found for this basename: CKTOTAL, CKMB, CKMBINDEX, TROPONINI,  in the last 168 hours BNP (last 3 results) No results found for this basename: PROBNP,  in the last 8760 hours CBG:  Recent Labs Lab 03/16/13 0749 03/16/13 1117 03/16/13 1709 03/16/13 2124  GLUCAP 137* 79 114* 90    Recent Results (from the past 240 hour(s))  MRSA PCR SCREENING     Status: None   Collection Time    03/16/13  6:52 AM      Result Value Range Status   MRSA by PCR NEGATIVE  NEGATIVE Final   Comment:            The GeneXpert MRSA Assay (FDA     approved for NASAL specimens     only), is one component of a     comprehensive MRSA colonization  surveillance program. It is not     intended to diagnose MRSA     infection nor to guide or     monitor treatment for     MRSA infections.     Studies: Ct Head Wo Contrast  03/16/2013   *RADIOLOGY REPORT*  Clinical Data:   Syncope.  Trauma to back of head.  CT HEAD WITHOUT CONTRAST  Technique: Contiguous axial images were obtained from the base of the skull through the vertex without contrast  Comparison: Brain MR 01/07/2013.  Plain film cervical spine 01/28/2008.  Findings:  Bone windows demonstrate soft tissue swelling about the occipital scalp, including on image 38/series 3.  This is relatively mild.  No underlying calvarial fracture. Clear paranasal sinuses and mastoid air cells.  Soft tissue windows demonstrate mild low density in the periventricular white matter likely related to small vessel disease. No  mass lesion, hemorrhage, hydrocephalus, acute infarct,  intra-axial, or extra-axial fluid collection.  IMPRESSION:  1.  Posterior scalp soft tissue swelling, without acute intracranial abnormality. 2.  Mild small vessel ischemic change.  CT CERVICAL SPINE WITHOUT CONTRAST  Technique: Continous axial images were obtained of the cervical spine without contrast.  Sagittal and coronal reformats were constructed.  Findings:  Spinal visualization through mid T2 level.  Prevertebral soft tissues are within normal limits.  No apical pneumothorax. Spondylosis.  Multilevel neural foraminal narrowing.  Central canal stenosis at C5-6 and C6-C7. Skull base intact.  Marked facet arthropathy bilaterally.  A hemangioma within the T2 vertebral body.  Straightening of expected cervical lordosis. Facets are well- aligned.  Coronal reformats demonstrate a normal C1-C2 articulation.  IMPRESSION:  1.  No acute fracture or subluxation. 2.  Advance spondylosis, including areas of central canal and neural foraminal narrowing. 3. Straightening of expected cervical lordosis could be positional, due to muscular spasm, or ligamentous injury.   Original Report Authenticated By: Jeronimo Greaves, M.D.   Ct Cervical Spine Wo Contrast  03/16/2013   *RADIOLOGY REPORT*  Clinical Data:   Syncope.  Trauma to back of head.  CT HEAD WITHOUT CONTRAST  Technique: Contiguous axial images were obtained from the base of the skull through the vertex without contrast  Comparison: Brain MR 01/07/2013.  Plain film cervical spine 01/28/2008.  Findings:  Bone windows demonstrate soft tissue swelling about the occipital scalp, including on image 38/series 3.  This is relatively mild.  No underlying calvarial fracture. Clear paranasal sinuses and mastoid air cells.  Soft tissue windows demonstrate mild low density in the periventricular white matter likely related to small vessel disease. No  mass lesion, hemorrhage, hydrocephalus, acute infarct, intra-axial, or extra-axial fluid collection.  IMPRESSION:  1.  Posterior  scalp soft tissue swelling, without acute intracranial abnormality. 2.  Mild small vessel ischemic change.  CT CERVICAL SPINE WITHOUT CONTRAST  Technique: Continous axial images were obtained of the cervical spine without contrast.  Sagittal and coronal reformats were constructed.  Findings:  Spinal visualization through mid T2 level.  Prevertebral soft tissues are within normal limits.  No apical pneumothorax. Spondylosis.  Multilevel neural foraminal narrowing.  Central canal stenosis at C5-6 and C6-C7. Skull base intact.  Marked facet arthropathy bilaterally.  A hemangioma within the T2 vertebral body.  Straightening of expected cervical lordosis. Facets are well- aligned.  Coronal reformats demonstrate a normal C1-C2 articulation.  IMPRESSION:  1.  No acute fracture or subluxation. 2.  Advance spondylosis, including areas of central canal and neural foraminal narrowing. 3. Straightening of expected cervical lordosis  could be positional, due to muscular spasm, or ligamentous injury.   Original Report Authenticated By: Jeronimo Greaves, M.D.   Dg Chest Portable 1 View  03/16/2013   *RADIOLOGY REPORT*  Clinical Data: Near syncope.  PORTABLE CHEST - 1 VIEW  Comparison: Report of 04/02/2011  Findings: Numerous leads and wires project over the chest.  Midline trachea.  Normal heart size.  No pleural effusion or pneumothorax. Biapical pleural thickening.  A thin-walled cavitary lesion projects over the right midlung and measures 4.4 cm.  A 3 cm lesion was described projecting within the right middle lobe on 04/02/2011.  Left lung clear.  IMPRESSION: No acute findings.  A 4.4 cm thin-walled cavitary lesion projecting over the right midlung. Per a report of nearly 12 years ago, a 3.0 cm cavitary lesion is described.  Presuming this is the same lesion, this would suggest an indolent, likely infectious etiology.  Comparison with more recent radiographs or non emergent outpatient chest CT suggested.   Original Report  Authenticated By: Jeronimo Greaves, M.D.    Scheduled Meds: . heparin subcutaneous  5,000 Units Subcutaneous Q8H  . insulin aspart  0-15 Units Subcutaneous TID WC  . mupirocin ointment  1 application Topical BID  . QUEtiapine  100 mg Oral QHS  . ramelteon  8 mg Oral QHS  . risperiDONE  1 mg Oral BID  . simvastatin  20 mg Oral q1800  . traZODone  200 mg Oral QHS   Continuous Infusions: . sodium chloride 125 mL/hr at 03/17/13 0447     Marinda Elk  [facility] Pager [phone]. If 8PM-8AM, please contact night-coverage at [email or web address], password [email or web address] 03/17/2013, 7:12 AM  LOS: 1 day

## 2013-03-17 NOTE — Plan of Care (Signed)
Problem: Phase III Progression Outcomes Goal: Foley discontinued Outcome: Not Applicable Date Met:  03/17/13 No foley

## 2013-03-17 NOTE — Discharge Summary (Addendum)
Physician Discharge Summary  Albert Hess MWU:[id number] DOB: 01/27/1946 DOA: 03/16/2013  PCP: Bradd Burner, PA-C  Admit date: 03/16/2013 Discharge date: 03/18/2013  Time spent: 35 minutes  Recommendations for Outpatient Follow-up:  1. Follow up with PCP next week. 2. Check a b-met next week. 3. Follow up with PCP next weeks check a CBC and follow PLT's.  Discharge Diagnoses:  Principal Problem:   AKI (acute kidney injury) Active Problems:   Diabetes   Dehydration   Anemia   Thrombocytopenia   Discharge Condition: stable  Diet recommendation: heart healthy  Filed Weights   03/16/13 0652  Weight: 97.9 kg (215 lb 13.3 oz)    History of present illness:  67 y.o. male with long psych history, who presents after not eating anything or drinking much these past 4 days. He states he wasn't eating anything because he was constipated and "figured that if nothing was coming out he didn't want to put anything in". He states he thought he was drinking enough water but now "i guess I wasn't". After staff at [facility] care found out about the constipation they gave him 2 doses of laxatives and tonight he had 2 large formed BMs. On the way to the bathroom he had a syncopal event and struck the back of his head.  He was initially hypotensive in the ED, this improved significantly with 2L of IVF. His labs also showed him to be in AKI. Hospitalist has been asked to admit.   Hospital Course:  AKI (acute kidney injury)  - Baseline Cr. 1.0. Increasing to 2.1, with IV fluids improved to baseline. - most likely due to lasix and ACE.  - held metformin. - resume as an outpatient lasix and ACE in 3 a days.  Dehydration  - IV fluids.   Anemia:  - baseline hbg 11.2.  - Hbg expected to drop due to decrease intravascular vol. - remained stable. - no overt bleeding.  Thrombocytopenia:  - Unclear etiology, not on any medication that would cause thrombocytopenia.  - no rashes or petechia.  -  follow up with PCP  Diabetes  - hold metformin   Procedures:  None  Consultations:  none  Discharge Exam: Filed Vitals:   03/17/13 0634 03/17/13 1428 03/17/13 2225 03/18/13 0544  BP: 124/72 96/58 99/56  97/64  Pulse: 75 77 73 66  Temp: 97.4 F (36.3 C) 98.5 F (36.9 C) 98 F (36.7 C) 97.8 F (36.6 C)  TempSrc: Oral Oral Oral Oral  Resp: 18 18 18 18   Height:      Weight:      SpO2: 99% 98% 98% 97%    General: see progress note. Discharge Instructions      Discharge Orders   Future Orders Complete By Expires     Diet - low sodium heart healthy  As directed     Increase activity slowly  As directed         Medication List         furosemide 40 MG tablet  Commonly known as:  LASIX  Take 1 tablet (40 mg total) by mouth daily. For edema.  Start taking on:  03/20/2013     lisinopril 10 MG tablet  Commonly known as:  PRINIVIL,ZESTRIL  Take 1 tablet (10 mg total) by mouth daily. For hypertension.  Start taking on:  03/21/2013     magnesium hydroxide 400 MG/5ML suspension  Commonly known as:  MILK OF MAGNESIA  Take 30 mLs by mouth daily as needed for  constipation.     metFORMIN 500 MG tablet  Commonly known as:  GLUCOPHAGE  Take 1 tablet (500 mg total) by mouth daily with breakfast. For glycemic control.     mupirocin ointment 2 %  Commonly known as:  BACTROBAN  Apply 1 application topically 2 (two) times daily. Foot ulcer     pravastatin 40 MG tablet  Commonly known as:  PRAVACHOL  Take 1 tablet (40 mg total) by mouth daily. For cholesterol.     QUEtiapine 50 MG tablet  Commonly known as:  SEROQUEL  Take 2 tablets at bedtime for sleep.     ramelteon 8 MG tablet  Commonly known as:  ROZEREM  Take 1 tablet (8 mg total) by mouth at bedtime. For insomnia.     risperiDONE 1 MG tablet  Commonly known as:  RISPERDAL  Take 1 tablet (1 mg total) by mouth 2 (two) times daily. For mental clarity.     sodium phosphate 7-19 GM/118ML Enem  Place 1 enema  rectally daily as needed (severe constipation).     traZODone 100 MG tablet  Commonly known as:  DESYREL  Take 2 tablets (200 mg total) by mouth at bedtime.       No Known Allergies Follow-up Information   Follow up with GURLEY,CHRISTIAN SCOTT, PA-C In 2 weeks. (hospital follow up)    Contact information:   [address], [address] [place] [address] [phone]        The results of significant diagnostics from this hospitalization (including imaging, microbiology, ancillary and laboratory) are listed below for reference.    Significant Diagnostic Studies: Ct Head Wo Contrast  03/16/2013   *RADIOLOGY REPORT*  Clinical Data:   Syncope.  Trauma to back of head.  CT HEAD WITHOUT CONTRAST  Technique: Contiguous axial images were obtained from the base of the skull through the vertex without contrast  Comparison: Brain MR 01/07/2013.  Plain film cervical spine 01/28/2008.  Findings:  Bone windows demonstrate soft tissue swelling about the occipital scalp, including on image 38/series 3.  This is relatively mild.  No underlying calvarial fracture. Clear paranasal sinuses and mastoid air cells.  Soft tissue windows demonstrate mild low density in the periventricular white matter likely related to small vessel disease. No  mass lesion, hemorrhage, hydrocephalus, acute infarct, intra-axial, or extra-axial fluid collection.  IMPRESSION:  1.  Posterior scalp soft tissue swelling, without acute intracranial abnormality. 2.  Mild small vessel ischemic change.  CT CERVICAL SPINE WITHOUT CONTRAST  Technique: Continous axial images were obtained of the cervical spine without contrast.  Sagittal and coronal reformats were constructed.  Findings:  Spinal visualization through mid T2 level.  Prevertebral soft tissues are within normal limits.  No apical pneumothorax. Spondylosis.  Multilevel neural foraminal narrowing.  Central canal stenosis at C5-6 and C6-C7. Skull base intact.  Marked facet  arthropathy bilaterally.  A hemangioma within the T2 vertebral body.  Straightening of expected cervical lordosis. Facets are well- aligned.  Coronal reformats demonstrate a normal C1-C2 articulation.  IMPRESSION:  1.  No acute fracture or subluxation. 2.  Advance spondylosis, including areas of central canal and neural foraminal narrowing. 3. Straightening of expected cervical lordosis could be positional, due to muscular spasm, or ligamentous injury.   Original Report Authenticated By: Jeronimo Greaves, M.D.   Ct Cervical Spine Wo Contrast  03/16/2013   *RADIOLOGY REPORT*  Clinical Data:   Syncope.  Trauma to back of head.  CT HEAD WITHOUT CONTRAST  Technique: Contiguous  axial images were obtained from the base of the skull through the vertex without contrast  Comparison: Brain MR 01/07/2013.  Plain film cervical spine 01/28/2008.  Findings:  Bone windows demonstrate soft tissue swelling about the occipital scalp, including on image 38/series 3.  This is relatively mild.  No underlying calvarial fracture. Clear paranasal sinuses and mastoid air cells.  Soft tissue windows demonstrate mild low density in the periventricular white matter likely related to small vessel disease. No  mass lesion, hemorrhage, hydrocephalus, acute infarct, intra-axial, or extra-axial fluid collection.  IMPRESSION:  1.  Posterior scalp soft tissue swelling, without acute intracranial abnormality. 2.  Mild small vessel ischemic change.  CT CERVICAL SPINE WITHOUT CONTRAST  Technique: Continous axial images were obtained of the cervical spine without contrast.  Sagittal and coronal reformats were constructed.  Findings:  Spinal visualization through mid T2 level.  Prevertebral soft tissues are within normal limits.  No apical pneumothorax. Spondylosis.  Multilevel neural foraminal narrowing.  Central canal stenosis at C5-6 and C6-C7. Skull base intact.  Marked facet arthropathy bilaterally.  A hemangioma within the T2 vertebral body.   Straightening of expected cervical lordosis. Facets are well- aligned.  Coronal reformats demonstrate a normal C1-C2 articulation.  IMPRESSION:  1.  No acute fracture or subluxation. 2.  Advance spondylosis, including areas of central canal and neural foraminal narrowing. 3. Straightening of expected cervical lordosis could be positional, due to muscular spasm, or ligamentous injury.   Original Report Authenticated By: Jeronimo Greaves, M.D.   Dg Chest Portable 1 View  03/16/2013   *RADIOLOGY REPORT*  Clinical Data: Near syncope.  PORTABLE CHEST - 1 VIEW  Comparison: Report of 04/02/2011  Findings: Numerous leads and wires project over the chest.  Midline trachea.  Normal heart size.  No pleural effusion or pneumothorax. Biapical pleural thickening.  A thin-walled cavitary lesion projects over the right midlung and measures 4.4 cm.  A 3 cm lesion was described projecting within the right middle lobe on 04/02/2011.  Left lung clear.  IMPRESSION: No acute findings.  A 4.4 cm thin-walled cavitary lesion projecting over the right midlung. Per a report of nearly 12 years ago, a 3.0 cm cavitary lesion is described.  Presuming this is the same lesion, this would suggest an indolent, likely infectious etiology.  Comparison with more recent radiographs or non emergent outpatient chest CT suggested.   Original Report Authenticated By: Jeronimo Greaves, M.D.    Microbiology: Recent Results (from the past 240 hour(s))  MRSA PCR SCREENING     Status: None   Collection Time    03/16/13  6:52 AM      Result Value Range Status   MRSA by PCR NEGATIVE  NEGATIVE Final   Comment:            The GeneXpert MRSA Assay (FDA     approved for NASAL specimens     only), is one component of a     comprehensive MRSA colonization     surveillance program. It is not     intended to diagnose MRSA     infection nor to guide or     monitor treatment for     MRSA infections.     Labs: Basic Metabolic Panel:  Recent Labs Lab  03/16/13 0207 03/16/13 0215 03/17/13 0625 03/17/13 0713  NA 134* 136 138 138  K 4.6 4.5 5.0 5.0  CL 99 98 109 107  CO2 26  --  25 25  GLUCOSE 110* 107* 99  124*  BUN 53* 51* 26* 24*  CREATININE 1.89* 2.10* 1.18 1.10  CALCIUM 8.4  --  8.3* 8.3*   Liver Function Tests:  Recent Labs Lab 03/16/13 0207  AST 15  ALT 10  ALKPHOS 50  BILITOT 0.4  PROT 5.9*  ALBUMIN 3.3*   No results found for this basename: LIPASE, AMYLASE,  in the last 168 hours No results found for this basename: AMMONIA,  in the last 168 hours CBC:  Recent Labs Lab 03/16/13 0215 03/16/13 0249 03/17/13 0625  WBC  --  4.6 2.3*  HGB 9.2* 9.9* 9.5*  HCT 27.0* 27.9* 27.0*  MCV  --  94.3 95.7  PLT  --  93* 81*   Cardiac Enzymes: No results found for this basename: CKTOTAL, CKMB, CKMBINDEX, TROPONINI,  in the last 168 hours BNP: BNP (last 3 results) No results found for this basename: PROBNP,  in the last 8760 hours CBG:  Recent Labs Lab 03/16/13 2124 03/17/13 0727 03/17/13 1241 03/17/13 1656 03/17/13 2221  GLUCAP 90 87 88 104* 127*     Signed:  FELIZ ORTIZ, Domonic Kimball  [facility] 03/18/2013, 7:26 AM

## 2013-03-18 DIAGNOSIS — N289 Disorder of kidney and ureter, unspecified: Secondary | ICD-10-CM

## 2013-03-18 LAB — URINALYSIS, ROUTINE W REFLEX MICROSCOPIC
Leukocytes, UA: NEGATIVE
Nitrite: NEGATIVE
Specific Gravity, Urine: 1.011 (ref 1.005–1.030)
Urobilinogen, UA: 0.2 mg/dL (ref 0.0–1.0)
pH: 6.5 (ref 5.0–8.0)

## 2013-03-18 LAB — GLUCOSE, CAPILLARY
Glucose-Capillary: 76 mg/dL (ref 70–99)
Glucose-Capillary: 105 mg/dL — ABNORMAL HIGH (ref 70–99)

## 2013-03-18 LAB — URINE MICROSCOPIC-ADD ON

## 2013-03-18 NOTE — Progress Notes (Signed)
OK per MD for d/c today back to [facility]F. CSW spoke with Lurena Joiner from the ALF- FL2 reviewed by facility and patient accept for re-admission.  Patient is alert, oriented and ambulatory. He is glad to be d/c'd back to facility today. Notified patient's nurse of d/c.  No further CSW needs identified.  Lorri Frederick. [place]  [phone]

## 2013-03-18 NOTE — Care Management Note (Signed)
    Page 1 of 1   03/18/2013     4:06:42 PM   CARE MANAGEMENT NOTE 03/18/2013  Patient:  RAEKWAN, SPELMAN   Account Number:  [id number]  Date Initiated:  03/18/2013  Documentation initiated by:  Letha Cape  Subjective/Objective Assessment:   dx AKI  admit- from [facility], [facility].     Action/Plan:   Anticipated DC Date:  03/18/2013   Anticipated DC Plan:  ASSISTED LIVING / REST HOME  In-house referral  Clinical Social Worker      DC Planning Services  CM consult      Choice offered to / List presented to:             Status of service:  Completed, signed off Medicare Important Message given?   (If response is "NO", the following Medicare IM given date fields will be blank) Date Medicare IM given:   Date Additional Medicare IM given:    Discharge Disposition:  ASSISTED LIVING  Per UR Regulation:  Reviewed for med. necessity/level of care/duration of stay  If discussed at Long Length of Stay Meetings, dates discussed:    Comments:  03/18/13 16:05 Letha Cape RNl, BSN [phone] patient is from [facility], ALF, patient is for dc back to ALF today, CSW following.

## 2013-03-25 ENCOUNTER — Emergency Department (HOSPITAL_COMMUNITY)
Admission: EM | Admit: 2013-03-25 | Discharge: 2013-03-25 | Disposition: A | Discharge status: Home / Self Care | Payer: Medicare Other | Attending: Emergency Medicine | Admitting: Emergency Medicine

## 2013-03-25 ENCOUNTER — Encounter (HOSPITAL_COMMUNITY): Payer: Self-pay | Admitting: Emergency Medicine

## 2013-03-25 DIAGNOSIS — I10 Essential (primary) hypertension: Secondary | ICD-10-CM

## 2013-03-25 DIAGNOSIS — F489 Nonpsychotic mental disorder, unspecified: Secondary | ICD-10-CM | POA: Insufficient documentation

## 2013-03-25 DIAGNOSIS — F4310 Post-traumatic stress disorder, unspecified: Secondary | ICD-10-CM | POA: Insufficient documentation

## 2013-03-25 DIAGNOSIS — F209 Schizophrenia, unspecified: Secondary | ICD-10-CM | POA: Insufficient documentation

## 2013-03-25 DIAGNOSIS — E119 Type 2 diabetes mellitus without complications: Secondary | ICD-10-CM | POA: Insufficient documentation

## 2013-03-25 DIAGNOSIS — Z4802 Encounter for removal of sutures: Secondary | ICD-10-CM | POA: Insufficient documentation

## 2013-03-25 DIAGNOSIS — F17200 Nicotine dependence, unspecified, uncomplicated: Secondary | ICD-10-CM | POA: Insufficient documentation

## 2013-03-25 DIAGNOSIS — E7800 Pure hypercholesterolemia, unspecified: Secondary | ICD-10-CM | POA: Insufficient documentation

## 2013-03-25 DIAGNOSIS — Z79899 Other long term (current) drug therapy: Secondary | ICD-10-CM | POA: Insufficient documentation

## 2013-03-25 NOTE — ED Notes (Signed)
Staples to posterior scalp intact. Wound well healed.

## 2013-03-25 NOTE — ED Provider Notes (Signed)
History    CSN: [id number] Arrival date & time 03/25/13  1027  First MD Initiated Contact with Patient 03/25/13 1051     Chief Complaint  Patient presents with  . Suture / Staple Removal   (Consider location/radiation/quality/duration/timing/severity/associated sxs/prior Treatment) The history is provided by the patient. No language interpreter was used.  DRAVIN LANCE is a 67 y/o M with PMHx of mental disorder, PTSD, Bipolar 2 disorder, HTN, high cholesterol, DM presenting to the ED for staple removal from scalp. Patient was in ED on 03/16/2013 after syncopal episode that led to patient to fall and end of getting laceration to the posterior aspect of the scalp in the occipital region of the scalp. Patient stated that he has had no complaints, that the laceration has been healing well. Stated that he has been cleaning it thoroughly everyday. Denied headache, pain, nausea, numbness, neck pain, neck stiffness, drainage from site, opening of wound.  PCP: Dr. Carollee Massed  Past Medical History  Diagnosis Date  . Mental disorder   . PTSD (post-traumatic stress disorder)   . Bipolar 2 disorder   . Schizophrenia   . Hypertension   . High cholesterol   . Diabetes mellitus without complication    Past Surgical History  Procedure Laterality Date  . Right side      pt points to R side for surgery   No family history on file. History  Substance Use Topics  . Smoking status: Light Tobacco Smoker -- 0.00 packs/day    Types: Cigarettes  . Smokeless tobacco: Not on file  . Alcohol Use: Yes     Comment: 6 pkg weekly    Review of Systems  Constitutional: Negative for fever and chills.  HENT: Negative for sore throat, trouble swallowing, neck pain and neck stiffness.   Eyes: Negative for visual disturbance.  Respiratory: Negative for chest tightness and shortness of breath.   Cardiovascular: Negative for chest pain.  Gastrointestinal: Negative for nausea, vomiting and diarrhea.  Skin:   Staple removal   Neurological: Negative for dizziness, weakness, numbness and headaches.  All other systems reviewed and are negative.    Allergies  Review of patient's allergies indicates no known allergies.  Home Medications   Current Outpatient Rx  Name  Route  Sig  Dispense  Refill  . furosemide (LASIX) 40 MG tablet   Oral   Take 1 tablet (40 mg total) by mouth daily. For edema.   30 tablet   0   . lisinopril (PRINIVIL,ZESTRIL) 10 MG tablet   Oral   Take 1 tablet (10 mg total) by mouth daily. For hypertension.   30 tablet   0   . magnesium hydroxide (MILK OF MAGNESIA) 400 MG/5ML suspension   Oral   Take 30 mLs by mouth daily as needed for constipation.         . metFORMIN (GLUCOPHAGE) 500 MG tablet   Oral   Take 1 tablet (500 mg total) by mouth daily with breakfast. For glycemic control.   30 tablet   0   . mupirocin ointment (BACTROBAN) 2 %   Topical   Apply 1 application topically 2 (two) times daily. Foot ulcer         . pravastatin (PRAVACHOL) 40 MG tablet   Oral   Take 1 tablet (40 mg total) by mouth daily. For cholesterol.   30 tablet   0   . QUEtiapine (SEROQUEL) 50 MG tablet      Take 2 tablets at  bedtime for sleep.   60 tablet   0   . ramelteon (ROZEREM) 8 MG tablet   Oral   Take 1 tablet (8 mg total) by mouth at bedtime. For insomnia.   30 tablet   0   . risperiDONE (RISPERDAL) 1 MG tablet   Oral   Take 1 tablet (1 mg total) by mouth 2 (two) times daily. For mental clarity.   60 tablet   0   . sodium phosphate (FLEET) 7-19 GM/118ML ENEM   Rectal   Place 1 enema rectally daily as needed (severe constipation).         . traZODone (DESYREL) 100 MG tablet   Oral   Take 2 tablets (200 mg total) by mouth at bedtime.   60 tablet   0    BP 121/74  Pulse 84  Temp(Src) 97 F (36.1 C) (Oral)  SpO2 98% Physical Exam  Nursing note and vitals reviewed. Constitutional: He is oriented to person, place, and time. He appears  well-developed and well-nourished. No distress.  HENT:  Head: Normocephalic and atraumatic.    Mouth/Throat: Oropharynx is clear and moist. No oropharyngeal exudate.  Eyes: Conjunctivae and EOM are normal. Pupils are equal, round, and reactive to light. Right eye exhibits no discharge. Left eye exhibits no discharge.  Neck: Normal range of motion. Neck supple.  Cardiovascular: Normal rate and regular rhythm.  Exam reveals no friction rub.   No murmur heard. Pulmonary/Chest: Effort normal and breath sounds normal. No respiratory distress. He has no wheezes. He has no rales.  Neurological: He is alert and oriented to person, place, and time. No cranial nerve deficit. He exhibits normal muscle tone. Coordination normal.  Skin: Skin is warm and dry. No rash noted. He is not diaphoretic. No erythema.  Psychiatric: He has a normal mood and affect. His behavior is normal. Thought content normal.    ED Course  Procedures (including critical care time) Labs Reviewed - No data to display No results found.  1. Encounter for staple removal   2. HTN (hypertension)   3. DM (diabetes mellitus)   4. High cholesterol     MDM  Patient presenting to the ED for staple removal. Wound healed nicely. Negative drainage, erythema, inflammation, swelling, pain upon palpation. Negative dehiscence. Scab noted. 3 staples removed from the scalp. Patient tolerated procedure well. Discussed with patient continuous care and hygiene tips. Referred to PCP. Discussed with patient to continue to monitor symptoms and if symptoms are to worsen or change to report back to the ED - strict return instructions given. Patient agreed to plan of care, understood, all questions answered.  Raymon Mutton, PA-C 03/25/13 1758

## 2013-03-26 NOTE — ED Provider Notes (Signed)
Medical screening examination/treatment/procedure(s) were performed by non-physician practitioner and as supervising physician I was immediately available for consultation/collaboration.  Toy Baker, MD 03/26/13 [phone]

## 2014-11-10 ENCOUNTER — Other Ambulatory Visit: Payer: Self-pay | Admitting: Family Medicine

## 2014-11-10 DIAGNOSIS — Z87891 Personal history of nicotine dependence: Secondary | ICD-10-CM

## 2014-11-24 ENCOUNTER — Ambulatory Visit: Payer: Self-pay

## 2015-08-08 ENCOUNTER — Emergency Department (HOSPITAL_COMMUNITY): Payer: Medicare Other

## 2015-08-08 ENCOUNTER — Emergency Department (HOSPITAL_COMMUNITY)
Admission: EM | Admit: 2015-08-08 | Discharge: 2015-08-08 | Disposition: A | Discharge status: Home / Self Care | Payer: Medicare Other | Attending: Emergency Medicine | Admitting: Emergency Medicine

## 2015-08-08 ENCOUNTER — Encounter (HOSPITAL_COMMUNITY): Payer: Self-pay | Admitting: *Deleted

## 2015-08-08 DIAGNOSIS — S93102A Unspecified subluxation of left toe(s), initial encounter: Secondary | ICD-10-CM | POA: Insufficient documentation

## 2015-08-08 DIAGNOSIS — E119 Type 2 diabetes mellitus without complications: Secondary | ICD-10-CM | POA: Insufficient documentation

## 2015-08-08 DIAGNOSIS — Y998 Other external cause status: Secondary | ICD-10-CM | POA: Insufficient documentation

## 2015-08-08 DIAGNOSIS — Y9389 Activity, other specified: Secondary | ICD-10-CM | POA: Insufficient documentation

## 2015-08-08 DIAGNOSIS — F17210 Nicotine dependence, cigarettes, uncomplicated: Secondary | ICD-10-CM | POA: Diagnosis not present

## 2015-08-08 DIAGNOSIS — F209 Schizophrenia, unspecified: Secondary | ICD-10-CM | POA: Insufficient documentation

## 2015-08-08 DIAGNOSIS — S60812A Abrasion of left wrist, initial encounter: Secondary | ICD-10-CM | POA: Diagnosis not present

## 2015-08-08 DIAGNOSIS — R55 Syncope and collapse: Secondary | ICD-10-CM | POA: Diagnosis not present

## 2015-08-08 DIAGNOSIS — S99922A Unspecified injury of left foot, initial encounter: Secondary | ICD-10-CM | POA: Diagnosis present

## 2015-08-08 DIAGNOSIS — Z9119 Patient's noncompliance with other medical treatment and regimen: Secondary | ICD-10-CM

## 2015-08-08 DIAGNOSIS — F3181 Bipolar II disorder: Secondary | ICD-10-CM | POA: Insufficient documentation

## 2015-08-08 DIAGNOSIS — Y9289 Other specified places as the place of occurrence of the external cause: Secondary | ICD-10-CM | POA: Insufficient documentation

## 2015-08-08 DIAGNOSIS — I10 Essential (primary) hypertension: Secondary | ICD-10-CM | POA: Diagnosis not present

## 2015-08-08 DIAGNOSIS — F4310 Post-traumatic stress disorder, unspecified: Secondary | ICD-10-CM | POA: Diagnosis not present

## 2015-08-08 DIAGNOSIS — Z91199 Patient's noncompliance with other medical treatment and regimen due to unspecified reason: Secondary | ICD-10-CM

## 2015-08-08 DIAGNOSIS — S80211A Abrasion, right knee, initial encounter: Secondary | ICD-10-CM | POA: Diagnosis not present

## 2015-08-08 DIAGNOSIS — S6982XA Other specified injuries of left wrist, hand and finger(s), initial encounter: Secondary | ICD-10-CM

## 2015-08-08 DIAGNOSIS — S60811A Abrasion of right wrist, initial encounter: Secondary | ICD-10-CM | POA: Diagnosis not present

## 2015-08-08 DIAGNOSIS — X58XXXA Exposure to other specified factors, initial encounter: Secondary | ICD-10-CM | POA: Insufficient documentation

## 2015-08-08 DIAGNOSIS — E7800 Pure hypercholesterolemia, unspecified: Secondary | ICD-10-CM | POA: Insufficient documentation

## 2015-08-08 LAB — URINE MICROSCOPIC-ADD ON

## 2015-08-08 LAB — BASIC METABOLIC PANEL
Anion gap: 12 (ref 5–15)
BUN: 10 mg/dL (ref 6–20)
CHLORIDE: 102 mmol/L (ref 101–111)
CO2: 20 mmol/L — ABNORMAL LOW (ref 22–32)
Calcium: 8.1 mg/dL — ABNORMAL LOW (ref 8.9–10.3)
Creatinine, Ser: 1 mg/dL (ref 0.61–1.24)
Glucose, Bld: 177 mg/dL — ABNORMAL HIGH (ref 65–99)
POTASSIUM: 3.6 mmol/L (ref 3.5–5.1)
SODIUM: 134 mmol/L — AB (ref 135–145)

## 2015-08-08 LAB — CBC
HEMATOCRIT: 41.4 % (ref 39.0–52.0)
Hemoglobin: 14.3 g/dL (ref 13.0–17.0)
MCH: 33 pg (ref 26.0–34.0)
MCHC: 34.5 g/dL (ref 30.0–36.0)
MCV: 95.6 fL (ref 78.0–100.0)
PLATELETS: 175 10*3/uL (ref 150–400)
RBC: 4.33 MIL/uL (ref 4.22–5.81)
RDW: 13.5 % (ref 11.5–15.5)
WBC: 5.2 10*3/uL (ref 4.0–10.5)

## 2015-08-08 LAB — URINALYSIS, ROUTINE W REFLEX MICROSCOPIC
Bilirubin Urine: NEGATIVE
GLUCOSE, UA: 100 mg/dL — AB
KETONES UR: NEGATIVE mg/dL
LEUKOCYTES UA: NEGATIVE
NITRITE: NEGATIVE
PH: 6 (ref 5.0–8.0)
PROTEIN: NEGATIVE mg/dL
Specific Gravity, Urine: 1.007 (ref 1.005–1.030)

## 2015-08-08 LAB — I-STAT TROPONIN, ED: TROPONIN I, POC: 0 ng/mL (ref 0.00–0.08)

## 2015-08-08 LAB — CBG MONITORING, ED: Glucose-Capillary: 153 mg/dL — ABNORMAL HIGH (ref 65–99)

## 2015-08-08 MED ORDER — CEPHALEXIN 500 MG PO CAPS
500.0000 mg | ORAL_CAPSULE | Freq: Once | ORAL | Status: AC
  Administered 2015-08-08: 500 mg via ORAL
  Filled 2015-08-08: qty 1

## 2015-08-08 MED ORDER — BACITRACIN ZINC 500 UNIT/GM EX OINT
TOPICAL_OINTMENT | Freq: Once | CUTANEOUS | Status: AC
  Administered 2015-08-08: 05:00:00 via TOPICAL
  Filled 2015-08-08: qty 0.9

## 2015-08-08 MED ORDER — SODIUM CHLORIDE 0.9 % IV BOLUS (SEPSIS)
1000.0000 mL | Freq: Once | INTRAVENOUS | Status: AC
  Administered 2015-08-08: 1000 mL via INTRAVENOUS

## 2015-08-08 MED ORDER — HYDROCODONE-ACETAMINOPHEN 5-325 MG PO TABS: 1.0000 | ORAL_TABLET | Freq: Four times a day (QID) | ORAL | Status: DC | PRN

## 2015-08-08 MED ORDER — LIDOCAINE HCL (PF) 1 % IJ SOLN
10.0000 mL | Freq: Once | INTRAMUSCULAR | Status: AC
  Administered 2015-08-08: 10 mL via INTRADERMAL
  Filled 2015-08-08: qty 10

## 2015-08-08 MED ORDER — CEPHALEXIN 500 MG PO CAPS: 500.0000 mg | ORAL_CAPSULE | Freq: Four times a day (QID) | ORAL | Status: DC

## 2015-08-08 NOTE — ED Provider Notes (Signed)
By signing my name below, I, Albert Hess, attest that this documentation has been prepared under the direction and in the presence of Albert Tandy N Yuvraj Pfeifer, DO.  Electronically Signed: Arlan OrganAshley Hess, ED Scribe. 08/08/2015. 1:42 AM.   TIME SEEN: 1:33 AM   CHIEF COMPLAINT:  Chief Complaint  Patient presents with  . Loss of Consciousness  . Toe Injury     HPI:  HPI Comments: Albert Hess is a 69 y.o. male with a PMHx of HTN and DM who presents to the Emergency Department here after a possible episode of loss of consciousness this evening. Pt states he was getting up out of bed to use the bathroom and woke up on the ground. Pt states he thinks that he felt lightheaded previous of this but denies chest pain, shortness of breath, palpitations. Head trauma unknown. Pt now presents with some abrasions to the right wrist, right knee, left wrist, great toe injury. States his left great toenail was almost completely avulsed. Denies any recent infectious symptoms. He denies any pain at this time. Mr. Albert Hess denies any pertinent cardiac history. Last tetanus vaccination was in the past 2 years. Patient is not on anticoagulation. He states that he stopped taking all of his medications months ago. He does not follow-up with a primary care provider.  PCP: Hess,Scott, PA-C    ROS: See HPI Constitutional: no fever  Eyes: no drainage  ENT: no runny nose   Cardiovascular:  no chest pain  Resp: no SOB  GI: no vomiting GU: no dysuria Integumentary: no rash. Positive for wound Allergy: no hives  Musculoskeletal: no leg swelling  Neurological: no slurred speech ROS otherwise negative  PAST MEDICAL HISTORY/PAST SURGICAL HISTORY:  Past Medical History  Diagnosis Date  . Mental disorder   . PTSD (post-traumatic stress disorder)   . Bipolar 2 disorder (HCC)   . Schizophrenia (HCC)   . Hypertension   . High cholesterol   . Diabetes mellitus without complication (HCC)     MEDICATIONS:  Prior to Admission  medications   Medication Sig Start Date End Date Taking? Authorizing Provider  furosemide (LASIX) 40 MG tablet Take 1 tablet (40 mg total) by mouth daily. For edema. Patient not taking: Reported on 08/08/2015 03/20/13   Albert ElkAbraham Feliz Ortiz, MD  lisinopril (PRINIVIL,ZESTRIL) 10 MG tablet Take 1 tablet (10 mg total) by mouth daily. For hypertension. Patient not taking: Reported on 08/08/2015 03/21/13   Albert ElkAbraham Feliz Ortiz, MD  metFORMIN (GLUCOPHAGE) 500 MG tablet Take 1 tablet (500 mg total) by mouth daily with breakfast. For glycemic control. Patient not taking: Reported on 08/08/2015 01/21/13   Albert JulianNeil T Mashburn, PA-C  pravastatin (PRAVACHOL) 40 MG tablet Take 1 tablet (40 mg total) by mouth daily. For cholesterol. Patient not taking: Reported on 08/08/2015 01/21/13   Rona RavensNeil T Mashburn, PA-C  QUEtiapine (SEROQUEL) 50 MG tablet Take 2 tablets at bedtime for sleep. Patient not taking: Reported on 08/08/2015 01/21/13   Rona RavensNeil T Mashburn, PA-C  ramelteon (ROZEREM) 8 MG tablet Take 1 tablet (8 mg total) by mouth at bedtime. For insomnia. Patient not taking: Reported on 08/08/2015 01/21/13   Albert JulianNeil T Mashburn, PA-C  risperiDONE (RISPERDAL) 1 MG tablet Take 1 tablet (1 mg total) by mouth 2 (two) times daily. For mental clarity. Patient not taking: Reported on 08/08/2015 01/21/13   Albert JulianNeil T Mashburn, PA-C  traZODone (DESYREL) 100 MG tablet Take 2 tablets (200 mg total) by mouth at bedtime. Patient not taking: Reported on 08/08/2015 01/21/13  Albert Julian, PA-C    ALLERGIES:  No Known Allergies  SOCIAL HISTORY:  Social History  Substance Use Topics  . Smoking status: Light Tobacco Smoker -- 0.00 packs/day    Types: Cigarettes  . Smokeless tobacco: Not on file  . Alcohol Use: Yes     Comment: 6 pkg weekly    FAMILY HISTORY: No family history on file.  EXAM: BP 123/72 mmHg  Pulse 92  Temp(Src) 97.8 F (36.6 C) (Oral)  Resp 20  Ht  (1.88 m)  Wt 265 lb (120.203 kg)  BMI 34.01 kg/m2  SpO2  97% CONSTITUTIONAL: Alert and oriented and responds appropriately to questions. Obese, GCS 15 HEAD: Normocephalic; atraumatic EYES: Conjunctivae clear, PERRL, EOMI ENT: normal nose; no rhinorrhea; dry mucous membranes; pharynx without lesions noted; no dental injury; no septal hematoma NECK: Supple, no meningismus, no LAD; no midline spinal tenderness, step-off or deformity CARD: RRR; S1 and S2 appreciated; no murmurs, no clicks, no rubs, no gallops RESP: Normal chest excursion without splinting or tachypnea; breath sounds clear and equal bilaterally; no wheezes, no rhonchi, no rales; no hypoxia or respiratory distress CHEST:  chest wall stable, no crepitus or ecchymosis or deformity, nontender to palpation ABD/GI: Normal bowel sounds; non-distended; soft, non-tender, no rebound, no guarding PELVIS:  stable, nontender to palpation BACK:  The back appears normal and is non-tender to palpation, there is no CVA tenderness; no midline spinal tenderness, step-off or deformity EXT: Normal ROM in all joints; non-tender to palpation; no edema; normal capillary refill; no cyanosis, no bony tenderness or bony deformity of patient's extremities, no joint effusion, no ecchymosis or lacerations; patient has onychomycotic thick discolored toenails diffusely, tender on the left great toe is almost completely avulsed only attached at the medial aspect without any obvious laceration but there is some bleeding from the nailbed which is superficial; palpable and equal DP pulses bilaterally SKIN: Normal color for age and race; warm. Abrasion to R wrist and R knee. Ecchymosis noted to the left wrist without bony tenderness. NEURO: Moves all extremities equally, decreased sensation in bilateral feet which is chronic, otherwise sensation to light touch intact diffusely, cranial nerves II through XII intact PSYCH: The patient's mood and manner are appropriate. Grooming and personal hygiene are appropriate.  MEDICAL  DECISION MAKING: Patient here with possible syncopal event after standing up. Suspect orthostasis. He does appear slightly orthostatic based on vital signs but this is after receiving 500 mL of IV fluid. No other preceding symptoms. Patient is difficult and refusing most of the workup. He does agree to basic blood work, IV fluids and an x-ray of his left foot. He states his tetanus is up-to-date. He is neurologically intact. Refusing head and cervical spine imaging. Agrees with toenail removal and laceration repairs if needed. Toenail will need to be removed to see if there is any laceration that I'm unable to identify and to clean this wound appropriately.  ED PROGRESS: Patient's labs are unremarkable. He reports feeling better after IV fluids. X-ray of the left foot shows medial subluxation of the distal phalanx with probable tiny avulsion fragment within the interphalangeal joint. I have attempted to reduce the subluxation after digital block without any relief. Suspect that this may be chronic. His toenail was completely removed and given it was significantly deformed with obvious fungal infection it was not reattached. There was some mild damage to the nailbed matrix but no laceration that needs repair. Area has been irrigated copiously and bacitracin and Xeroform dressing  applied. We have applied a sterile dressing. Patient states at this time he does not want to follow-up with orthopedic physicians. Given he is noncompliant with medical management I do not feel I need to emergently wake up an orthopedic surgeon if he is not planning to follow-up and refuses any treatment if indicated in the emergency department. He states he has seen Dr. Lajoyce Corners in the past and that if he feels it is necessary he will follow-up with this orthopedic physician. I have provided him with this phone number. Have placed him on antibiotics given he is a diabetic with neuropathy and decreased sensation in his feet and would be at risk  for infection. Given first dose in the emergency department. Have placed him in a fracture boot. He refuses to use crutches or a walker. He has been able to ambulate in the emergency department. Have discussed at length with patient and family return precautions and importance of outpatient follow-up. I recommended he use shoes at all times given his significant diabetic neuropathy. We'll discharge with prescriptions for Keflex and Vicodin to take as needed for pain. Have provided him outpatient orthopedic follow-up information but I doubt that he will follow-up. Patient and family verbalize understanding and are comfortable with this plan.     EKG Interpretation  Date/Time:  Saturday August 08 2015 00:49:20 EST Ventricular Rate:  95 PR Interval:  181 QRS Duration: 88 QT Interval:  357 QTC Calculation: 449 R Axis:   96 Text Interpretation:  Sinus rhythm Right axis deviation Borderline low voltage, extremity leads No significant change since last tracing Confirmed by Deshannon Seide,  DO, Senon Nixon ([id number]) on 08/08/2015 1:05:22 AM        NERVE BLOCK Performed by: Raelyn Number Consent: Verbal consent obtained. Required items: required blood products, implants, devices, and special equipment available Time out: Immediately prior to procedure a "time out" was called to verify the correct patient, procedure, equipment, support staff and site/side marked as required.  Indication: Nail removal  Nerve block body site: Left great toe   Preparation: Patient was prepped and draped in the usual sterile fashion. Needle gauge: 24 G Location technique: anatomical landmarks  Local anesthetic: 1% lidocaine without epinephrine   Anesthetic total: 5 ml  Outcome: pain improved Patient tolerance: Patient tolerated the procedure well with no immediate complications.    .Nail Removal Date/Time: 08/08/2015 4:30 AM Performed by: Raelyn Number Authorized by: Raelyn Number Consent: Verbal consent  obtained. Risks and benefits: risks, benefits and alternatives were discussed Consent given by: patient Patient understanding: patient states understanding of the procedure being performed Patient identity confirmed: verbally with patient Time out: Immediately prior to procedure a "time out" was called to verify the correct patient, procedure, equipment, support staff and site/side marked as required. Location: left foot Location details: left big toe Anesthesia: digital block Local anesthetic: lidocaine 1% without epinephrine Anesthetic total: 55 ml Patient sedated: no Preparation: skin prepped with Betadine and sterile field established Amount removed: complete Wedge excision of skin of nail fold: no Nail bed sutured: no Nail matrix removed: partial Removed nail replaced and anchored: no Dressing: Xeroform gauze, antibiotic ointment, 4x4 and gauze roll Patient tolerance: Patient tolerated the procedure well with no immediate complications    I personally performed the services described in this documentation, which was scribed in my presence. The recorded information has been reviewed and is accurate.   Layla Maw Endya Austin, DO 08/08/15 [phone]

## 2015-08-08 NOTE — ED Notes (Addendum)
Pt refuses to use urinal but insists on ambulating to restroom. Yellow socks place.d. Pt assisted to restroom by a family member. The male family member states "if y'all could get in here and fix that toe as soon as possible that would be great". They were informed we see patient's as soon as possible.   Pt reports that he will provide a urine sample while in restroom.

## 2015-08-08 NOTE — ED Notes (Signed)
Pt states that he was getting out of bed to go the bathroom and does not remember what happened; pt woke up on the floor; pt with injury to left great toe; abrasions to left great toes, left knee and left hand; pt denies chest pain or dizziness at present; pt unsure if he just fell or had a syncopal episode

## 2015-08-08 NOTE — Discharge Instructions (Signed)
You were seen in the emergency department for a possible episode of passing out called syncope. Your labs today were normal but you did have some signs of orthostasis which can suggest dehydration. I recommend that you increase your fluid intake. Please take time when standing up from a lying or seated position before you start walking. You're also found to have multiple small skin tears as well as a nail bed injury to her left great toe. 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 I have discharged on antibiotics because of your nail bed injury but there was no laceration that could be repaired and given your toenail was thick and had a fungal infection I could not put it back over the nail bed. I strongly recommended you follow-up with your orthopedic physician Dr. Lajoyce Cornersuda. Please change your dressing once a day. You should wear your postop shoe at all times. If you develop fever, redness, drainage of pus or any other symptoms concerning to you, please return to the emergency department.   Nail Bed Injury The nail bed is the soft tissue under a fingernail or toenail that is the origin for new nail growth. Various types of injuries can occur at the nail bed. These injuries may involve bruising or bleeding under the nail, cuts (lacerations) in the nail or nail bed, or loss of a part of the nail or the whole nail (avulsion). In some cases, a nail bed injury accompanies another injury, such as a break (fracture) of the bone at the tip of the finger or toe. Nail bed injuries are common in people who have jobs that require performing manual tasks with their hands, such as carpenters and landscapers.  The nail bed includes the growth center of the nail. If this growth center is damaged, the injured nail may not grow back normally if at all. The regrown nail might have an abnormal shape or appearance. It can take several months for a damaged  or torn-off nail to regrow. Depending on the nature and extent of the nail bed injury, there may be a permanent disruption of normal nail growth. CAUSES  Damage to the nail bed area is usually caused by crushing, pinching, cutting, or tearing injuries of the fingertip or toe. For example, these injuries may occur when a fingertip gets caught in a door, hit by a hammer, or damaged in accidents involving electrical tools or power machinery.  SYMPTOMS  Symptoms vary depending on the nature of the injury. Symptoms may include:  Pain in the injured area.  Bleeding.  Swelling.  Discoloration.  Collection of blood under the nail (hematoma).  Deformed or split nail.  Loose nail (not stuck to the nail bed).  Loss of all or part of the nail. DIAGNOSIS  Your caregiver will take a medical history and examine the injured area. You will be asked to describe how the injury occurred. X-rays may be done to see if you have a fracture. Your caregiver might also check for conditions that may affect healing, such as diabetes, nerve problems, or poor circulation.  TREATMENT  Treatment depends on the type of injury.  The injury may not require any special treatment other than keeping the area clean and free of infection.   Your caregiver may drain the collection of blood from under the nail. This can be done by making a small hole in the nail.   Your caregiver  may remove all or part of your nail. This might be necessary to stitch (suture) any laceration in the nail bed. Before doing this, the caregiver will likely give you medication to numb the nail area (local anesthetic). In some cases, the caregiver may choose to numb the entire finger or toe (digital nerve block). Depending on the location and size of the nail bed injury, an avulsed nail is sometimes stitched back in place to provide temporary protection to the nail bed until the new nail grows in.  Your caregiver may apply bandages (dressings) or  splints to the area.  You might be prescribed antibiotic medication to help prevent infection.  For certain injuries, your caregiver may direct you to see a hand or foot specialist.  You may need a tetanus shot if:  You cannot remember when you had your last tetanus shot.  You have never had a tetanus shot.  The injury broke your skin. If you get a tetanus shot, your arm may swell, get red, and feel warm to the touch. This is common and not a problem. If you need a tetanus shot and you choose not to have one, there is a rare chance of getting tetanus. Sickness from tetanus can be serious. HOME CARE INSTRUCTIONS   Keep your hand or foot raised (elevated) to relieve pain and swelling.   For an injured toenail, lie in bed or on a couch with your leg on pillows. You can also sit in a recliner with your leg up. Avoid walking or letting your leg dangle. When you walk, wear an open-toe shoe.  For an injured fingernail, keep your hand above the level of your heart. Use pillows on a table or on the arm of your chair while sitting. Use them on your bed while sleeping.   Keep your injury protected with dressings or splints as directed by your caregiver.   Keep any dressings clean and dry. Change or remove your dressings as directed by your caregiver.   Only take over-the-counter or prescription medications as directed by your caregiver. If you were prescribed antibiotics, take them as directed. Finish them even if you start to feel better.   Follow up with your caregiver as directed.  SEEK MEDICAL CARE IF:   You have pain that is not controlled with medication.   You have any problems caring for your injury.  SEEK IMMEDIATE MEDICAL CARE IF:   You have increased pain, drainage, or bleeding in the injured area.   You have redness, soreness, and swelling (inflammation) in the injured area.  You have a fever or persistent symptoms for more than 2-3 days.  You have a fever and  your symptoms suddenly get worse.  You have swelling that spreads from your finger into your hand or from your toe into your foot.  MAKE SURE YOU:  Understand these instructions.  Will watch your condition.  Will get help right away if you are not doing well or get worse.   This information is not intended to replace advice given to you by your health care provider. Make sure you discuss any questions you have with your health care provider.   Document Released: 10/13/2004 Document Revised: 12/31/2012 Document Reviewed: 09/27/2012 Elsevier Interactive Patient Education 2016 [employer]   Orthostatic Hypotension Orthostatic hypotension is a sudden drop in blood pressure. It happens when you quickly stand up from a seated or lying position. You may feel dizzy or light-headed. This can last for just a few  seconds or for up to a few minutes. It is usually not a serious problem. However, if this happens frequently or gets worse, it can be a sign of something more serious. CAUSES  Different things can cause orthostatic hypotension, including:   Loss of body fluids (dehydration).  Medicines that lower blood pressure.  Sudden changes in posture, such as standing up quickly after you have been sitting or lying down.  Taking too much of your medicine. SIGNS AND SYMPTOMS   Light-headedness or dizziness.   Fainting or near-fainting.   A fast heart rate.   Weakness.   Feeling tired (fatigue).  DIAGNOSIS  Your health care provider may do several things to help diagnose your condition and identify the cause. These may include:   Taking a medical history and doing a physical exam.  Checking your blood pressure. Your health care provider will check your blood pressure when you are:  Lying down.  Sitting.  Standing.  Using tilt table testing. In this test, you lie down on a table that moves from a lying position to a standing position. You will be strapped onto the table.  This test monitors your blood pressure and heart rate when you are in different positions. TREATMENT  Treatment will vary depending on the cause. Possible treatments include:   Changing the dosage of your medicines.  Wearing compression stockings on your lower legs.  Standing up slowly after sitting or lying down.  Eating more salt.  Eating frequent, small meals.  In some cases, getting IV fluids.  Taking medicine to enhance fluid retention. HOME CARE INSTRUCTIONS  Only take over-the-counter or prescription medicines as directed by your health care provider.  Follow your health care provider's instructions for changing the dosage of your current medicines.  Do not stop or adjust your medicine on your own.  Stand up slowly after sitting or lying down. This allows your body to adjust to the different position.  Wear compression stockings as directed.  Eat extra salt as directed.  Do not add extra salt to your diet unless directed to by your health care provider.  Eat frequent, small meals.  Avoid standing suddenly after eating.  Avoid hot showers or excessive heat as directed by your health care provider.  Keep all follow-up appointments. SEEK MEDICAL CARE IF:  You continue to feel dizzy or light-headed after standing.  You feel groggy or confused.  You feel cold, clammy, or sick to your stomach (nauseous).  You have blurred vision.  You feel short of breath. SEEK IMMEDIATE MEDICAL CARE IF:   You faint after standing.  You have chest pain.  You have difficulty breathing.   You lose feeling or movement in your arms or legs.   You have slurred speech or difficulty talking, or you are unable to talk.  MAKE SURE YOU:   Understand these instructions.  Will watch your condition.  Will get help right away if you are not doing well or get worse.   This information is not intended to replace advice given to you by your health care provider. Make sure  you discuss any questions you have with your health care provider.   Document Released: 08/26/2002 Document Revised: 09/10/2013 Document Reviewed: 06/28/2013 Elsevier Interactive Patient Education 2016 [employer].    Syncope Syncope is a medical term for fainting or passing out. This means you lose consciousness and drop to the ground. People are generally unconscious for less than 5 minutes. You may have some muscle twitches  for up to 15 seconds before waking up and returning to normal. Syncope occurs more often in older adults, but it can happen to anyone. While most causes of syncope are not dangerous, syncope can be a sign of a serious medical problem. It is important to seek medical care.  CAUSES  Syncope is caused by a sudden drop in blood flow to the brain. The specific cause is often not determined. Factors that can bring on syncope include:  Taking medicines that lower blood pressure.  Sudden changes in posture, such as standing up quickly.  Taking more medicine than prescribed.  Standing in one place for too long.  Seizure disorders.  Dehydration and excessive exposure to heat.  Low blood sugar (hypoglycemia).  Straining to have a bowel movement.  Heart disease, irregular heartbeat, or other circulatory problems.  Fear, emotional distress, seeing blood, or severe pain. SYMPTOMS  Right before fainting, you may:  Feel dizzy or light-headed.  Feel nauseous.  See all white or all black in your field of vision.  Have cold, clammy skin. DIAGNOSIS  Your health care provider will ask about your symptoms, perform a physical exam, and perform an electrocardiogram (ECG) to record the electrical activity of your heart. Your health care provider may also perform other heart or blood tests to determine the cause of your syncope which may include:  Transthoracic echocardiogram (TTE). During echocardiography, sound waves are used to evaluate how blood flows through your  heart.  Transesophageal echocardiogram (TEE).  Cardiac monitoring. This allows your health care provider to monitor your heart rate and rhythm in real time.  Holter monitor. This is a portable device that records your heartbeat and can help diagnose heart arrhythmias. It allows your health care provider to track your heart activity for several days, if needed.  Stress tests by exercise or by giving medicine that makes the heart beat faster. TREATMENT  In most cases, no treatment is needed. Depending on the cause of your syncope, your health care provider may recommend changing or stopping some of your medicines. HOME CARE INSTRUCTIONS  Have someone stay with you until you feel stable.  Do not drive, use machinery, or play sports until your health care provider says it is okay.  Keep all follow-up appointments as directed by your health care provider.  Lie down right away if you start feeling like you might faint. Breathe deeply and steadily. Wait until all the symptoms have passed.  Drink enough fluids to keep your urine clear or pale yellow.  If you are taking blood pressure or heart medicine, get up slowly and take several minutes to sit and then stand. This can reduce dizziness. SEEK IMMEDIATE MEDICAL CARE IF:   You have a severe headache.  You have unusual pain in the chest, abdomen, or back.  You are bleeding from your mouth or rectum, or you have black or tarry stool.  You have an irregular or very fast heartbeat.  You have pain with breathing.  You have repeated fainting or seizure-like jerking during an episode.  You faint when sitting or lying down.  You have confusion.  You have trouble walking.  You have severe weakness.  You have vision problems. If you fainted, call your local emergency services (911 in [place] Do not drive yourself to the hospital.    This information is not intended to replace advice given to you by your health care provider. Make sure  you discuss any questions you have with your health care  provider.   Document Released: 09/05/2005 Document Revised: 01/20/2015 Document Reviewed: 11/04/2011 Elsevier Interactive Patient Education [employer]

## 2016-10-13 ENCOUNTER — Inpatient Hospital Stay (HOSPITAL_COMMUNITY)
Admission: EM | Admit: 2016-10-13 | Discharge: 2016-10-18 | DRG: 193 | Disposition: A | Discharge status: Home Health | Payer: Medicare Other | Attending: Internal Medicine | Admitting: Internal Medicine

## 2016-10-13 ENCOUNTER — Encounter (HOSPITAL_COMMUNITY): Payer: Self-pay | Admitting: Emergency Medicine

## 2016-10-13 ENCOUNTER — Emergency Department (HOSPITAL_COMMUNITY): Payer: Medicare Other

## 2016-10-13 DIAGNOSIS — J9602 Acute respiratory failure with hypercapnia: Secondary | ICD-10-CM | POA: Diagnosis present

## 2016-10-13 DIAGNOSIS — J9601 Acute respiratory failure with hypoxia: Secondary | ICD-10-CM | POA: Diagnosis present

## 2016-10-13 DIAGNOSIS — E875 Hyperkalemia: Secondary | ICD-10-CM | POA: Diagnosis not present

## 2016-10-13 DIAGNOSIS — F319 Bipolar disorder, unspecified: Secondary | ICD-10-CM | POA: Diagnosis not present

## 2016-10-13 DIAGNOSIS — E7800 Pure hypercholesterolemia, unspecified: Secondary | ICD-10-CM | POA: Diagnosis present

## 2016-10-13 DIAGNOSIS — Z79899 Other long term (current) drug therapy: Secondary | ICD-10-CM

## 2016-10-13 DIAGNOSIS — Z9119 Patient's noncompliance with other medical treatment and regimen: Secondary | ICD-10-CM | POA: Diagnosis not present

## 2016-10-13 DIAGNOSIS — R0602 Shortness of breath: Secondary | ICD-10-CM | POA: Diagnosis not present

## 2016-10-13 DIAGNOSIS — Z9114 Patient's other noncompliance with medication regimen: Secondary | ICD-10-CM

## 2016-10-13 DIAGNOSIS — E119 Type 2 diabetes mellitus without complications: Secondary | ICD-10-CM | POA: Diagnosis not present

## 2016-10-13 DIAGNOSIS — D696 Thrombocytopenia, unspecified: Secondary | ICD-10-CM | POA: Diagnosis present

## 2016-10-13 DIAGNOSIS — Z7984 Long term (current) use of oral hypoglycemic drugs: Secondary | ICD-10-CM

## 2016-10-13 DIAGNOSIS — J441 Chronic obstructive pulmonary disease with (acute) exacerbation: Secondary | ICD-10-CM | POA: Diagnosis present

## 2016-10-13 DIAGNOSIS — F1210 Cannabis abuse, uncomplicated: Secondary | ICD-10-CM | POA: Diagnosis present

## 2016-10-13 DIAGNOSIS — F209 Schizophrenia, unspecified: Secondary | ICD-10-CM | POA: Diagnosis not present

## 2016-10-13 DIAGNOSIS — F4310 Post-traumatic stress disorder, unspecified: Secondary | ICD-10-CM | POA: Diagnosis present

## 2016-10-13 DIAGNOSIS — F17210 Nicotine dependence, cigarettes, uncomplicated: Secondary | ICD-10-CM | POA: Diagnosis present

## 2016-10-13 DIAGNOSIS — F1010 Alcohol abuse, uncomplicated: Secondary | ICD-10-CM | POA: Diagnosis present

## 2016-10-13 DIAGNOSIS — Z720 Tobacco use: Secondary | ICD-10-CM | POA: Diagnosis present

## 2016-10-13 DIAGNOSIS — E785 Hyperlipidemia, unspecified: Secondary | ICD-10-CM | POA: Diagnosis present

## 2016-10-13 DIAGNOSIS — J440 Chronic obstructive pulmonary disease with acute lower respiratory infection: Secondary | ICD-10-CM | POA: Diagnosis not present

## 2016-10-13 DIAGNOSIS — I10 Essential (primary) hypertension: Secondary | ICD-10-CM | POA: Diagnosis present

## 2016-10-13 DIAGNOSIS — F3181 Bipolar II disorder: Secondary | ICD-10-CM | POA: Diagnosis present

## 2016-10-13 DIAGNOSIS — R05 Cough: Secondary | ICD-10-CM | POA: Diagnosis not present

## 2016-10-13 DIAGNOSIS — J101 Influenza due to other identified influenza virus with other respiratory manifestations: Secondary | ICD-10-CM | POA: Diagnosis not present

## 2016-10-13 DIAGNOSIS — R0682 Tachypnea, not elsewhere classified: Secondary | ICD-10-CM | POA: Diagnosis not present

## 2016-10-13 HISTORY — DX: Chronic obstructive pulmonary disease, unspecified: J44.9

## 2016-10-13 LAB — CBC WITH DIFFERENTIAL/PLATELET
Basophils Absolute: 0 10*3/uL (ref 0.0–0.1)
Basophils Relative: 1 %
Eosinophils Absolute: 0 10*3/uL (ref 0.0–0.7)
Eosinophils Relative: 1 %
HEMATOCRIT: 43.6 % (ref 39.0–52.0)
Hemoglobin: 15.6 g/dL (ref 13.0–17.0)
LYMPHS PCT: 14 %
Lymphs Abs: 0.6 10*3/uL — ABNORMAL LOW (ref 0.7–4.0)
MCH: 33.5 pg (ref 26.0–34.0)
MCHC: 35.8 g/dL (ref 30.0–36.0)
MCV: 93.6 fL (ref 78.0–100.0)
MONO ABS: 0.3 10*3/uL (ref 0.1–1.0)
MONOS PCT: 7 %
NEUTROS ABS: 3.3 10*3/uL (ref 1.7–7.7)
Neutrophils Relative %: 77 %
Platelets: 132 10*3/uL — ABNORMAL LOW (ref 150–400)
RBC: 4.66 MIL/uL (ref 4.22–5.81)
RDW: 14.1 % (ref 11.5–15.5)
WBC: 4.2 10*3/uL (ref 4.0–10.5)

## 2016-10-13 LAB — COMPREHENSIVE METABOLIC PANEL
ALT: 33 U/L (ref 17–63)
ANION GAP: 9 (ref 5–15)
AST: 50 U/L — ABNORMAL HIGH (ref 15–41)
Albumin: 3.4 g/dL — ABNORMAL LOW (ref 3.5–5.0)
Alkaline Phosphatase: 59 U/L (ref 38–126)
BILIRUBIN TOTAL: 2.1 mg/dL — AB (ref 0.3–1.2)
BUN: 17 mg/dL (ref 6–20)
CO2: 29 mmol/L (ref 22–32)
Calcium: 8.5 mg/dL — ABNORMAL LOW (ref 8.9–10.3)
Chloride: 95 mmol/L — ABNORMAL LOW (ref 101–111)
Creatinine, Ser: 0.82 mg/dL (ref 0.61–1.24)
GFR calc Af Amer: >60 mL/min (ref >60)
Glucose, Bld: 178 mg/dL — ABNORMAL HIGH (ref 65–99)
POTASSIUM: 4.4 mmol/L (ref 3.5–5.1)
Sodium: 133 mmol/L — ABNORMAL LOW (ref 135–145)
TOTAL PROTEIN: 6.8 g/dL (ref 6.5–8.1)

## 2016-10-13 LAB — BRAIN NATRIURETIC PEPTIDE: B NATRIURETIC PEPTIDE 5: 40.6 pg/mL (ref 0.0–100.0)

## 2016-10-13 LAB — I-STAT TROPONIN, ED: Troponin i, poc: 0 ng/mL (ref 0.00–0.08)

## 2016-10-13 MED ORDER — IPRATROPIUM BROMIDE 0.02 % IN SOLN
1.0000 mg | Freq: Once | RESPIRATORY_TRACT | Status: AC
  Administered 2016-10-13: 1 mg via RESPIRATORY_TRACT
  Filled 2016-10-13: qty 5

## 2016-10-13 MED ORDER — ALBUTEROL (5 MG/ML) CONTINUOUS INHALATION SOLN
10.0000 mg/h | INHALATION_SOLUTION | Freq: Once | RESPIRATORY_TRACT | Status: AC
  Administered 2016-10-13: 10 mg/h via RESPIRATORY_TRACT
  Filled 2016-10-13: qty 20

## 2016-10-13 MED ORDER — AZITHROMYCIN 250 MG PO TABS
250.0000 mg | ORAL_TABLET | ORAL | Status: AC
  Administered 2016-10-14 – 2016-10-17 (×4): 250 mg via ORAL
  Filled 2016-10-13 (×4): qty 1

## 2016-10-13 MED ORDER — DM-GUAIFENESIN ER 30-600 MG PO TB12
1.0000 | ORAL_TABLET | Freq: Two times a day (BID) | ORAL | Status: DC
  Administered 2016-10-13 – 2016-10-15 (×4): 1 via ORAL
  Filled 2016-10-13 (×5): qty 1

## 2016-10-13 MED ORDER — HYDRALAZINE HCL 20 MG/ML IJ SOLN: 5.0000 mg | INTRAMUSCULAR | Status: DC | PRN

## 2016-10-13 MED ORDER — MAGNESIUM SULFATE 2 GM/50ML IV SOLN
2.0000 g | Freq: Once | INTRAVENOUS | Status: AC
  Administered 2016-10-13: 2 g via INTRAVENOUS
  Filled 2016-10-13: qty 50

## 2016-10-13 MED ORDER — ZOLPIDEM TARTRATE 5 MG PO TABS
5.0000 mg | ORAL_TABLET | Freq: Every evening | ORAL | Status: DC | PRN
  Administered 2016-10-14: 5 mg via ORAL
  Filled 2016-10-13: qty 1

## 2016-10-13 MED ORDER — METHYLPREDNISOLONE SODIUM SUCC 125 MG IJ SOLR
125.0000 mg | Freq: Once | INTRAMUSCULAR | Status: AC
  Administered 2016-10-13: 125 mg via INTRAVENOUS
  Filled 2016-10-13: qty 2

## 2016-10-13 MED ORDER — SODIUM CHLORIDE 0.9 % IV BOLUS (SEPSIS)
1000.0000 mL | Freq: Once | INTRAVENOUS | Status: AC
  Administered 2016-10-13: 1000 mL via INTRAVENOUS

## 2016-10-13 MED ORDER — IPRATROPIUM-ALBUTEROL 0.5-2.5 (3) MG/3ML IN SOLN: 3.0000 mL | RESPIRATORY_TRACT | Status: DC

## 2016-10-13 MED ORDER — IPRATROPIUM-ALBUTEROL 0.5-2.5 (3) MG/3ML IN SOLN
3.0000 mL | Freq: Three times a day (TID) | RESPIRATORY_TRACT | Status: DC
  Administered 2016-10-14 – 2016-10-18 (×14): 3 mL via RESPIRATORY_TRACT
  Filled 2016-10-13 (×14): qty 3

## 2016-10-13 MED ORDER — ACETAMINOPHEN 325 MG PO TABS: 650.0000 mg | ORAL_TABLET | Freq: Four times a day (QID) | ORAL | Status: DC | PRN

## 2016-10-13 MED ORDER — SODIUM CHLORIDE 0.9 % IV SOLN
INTRAVENOUS | Status: DC
  Administered 2016-10-13 – 2016-10-14 (×2): via INTRAVENOUS

## 2016-10-13 MED ORDER — AZITHROMYCIN 250 MG PO TABS
500.0000 mg | ORAL_TABLET | Freq: Every day | ORAL | Status: AC
  Administered 2016-10-13: 500 mg via ORAL
  Filled 2016-10-13: qty 2

## 2016-10-13 MED ORDER — ALBUTEROL SULFATE (2.5 MG/3ML) 0.083% IN NEBU: 2.5000 mg | INHALATION_SOLUTION | RESPIRATORY_TRACT | Status: DC | PRN

## 2016-10-13 MED ORDER — METHYLPREDNISOLONE SODIUM SUCC 125 MG IJ SOLR
60.0000 mg | Freq: Three times a day (TID) | INTRAMUSCULAR | Status: DC
  Administered 2016-10-14 – 2016-10-15 (×4): 60 mg via INTRAVENOUS
  Filled 2016-10-13 (×4): qty 2

## 2016-10-13 MED ORDER — LORAZEPAM 2 MG/ML IJ SOLN
0.5000 mg | Freq: Four times a day (QID) | INTRAMUSCULAR | Status: DC | PRN
  Administered 2016-10-14: 0.5 mg via INTRAVENOUS
  Filled 2016-10-13: qty 1

## 2016-10-13 MED ORDER — HYDROCODONE-ACETAMINOPHEN 5-325 MG PO TABS
1.0000 | ORAL_TABLET | Freq: Four times a day (QID) | ORAL | Status: DC | PRN
  Administered 2016-10-14: 2 via ORAL
  Filled 2016-10-13: qty 2

## 2016-10-13 MED ORDER — OSELTAMIVIR PHOSPHATE 75 MG PO CAPS
75.0000 mg | ORAL_CAPSULE | Freq: Once | ORAL | Status: AC
  Administered 2016-10-13: 75 mg via ORAL
  Filled 2016-10-13: qty 1

## 2016-10-13 MED ORDER — NICOTINE 21 MG/24HR TD PT24
21.0000 mg | MEDICATED_PATCH | Freq: Every day | TRANSDERMAL | Status: DC
  Filled 2016-10-13 (×4): qty 1

## 2016-10-13 MED ORDER — ONDANSETRON HCL 4 MG/2ML IJ SOLN: 4.0000 mg | Freq: Three times a day (TID) | INTRAMUSCULAR | Status: DC | PRN

## 2016-10-13 MED ORDER — LEVOFLOXACIN IN D5W 750 MG/150ML IV SOLN: 750.0000 mg | Freq: Once | INTRAVENOUS | Status: DC

## 2016-10-13 NOTE — ED Triage Notes (Signed)
Per EMS pt has had shortness of breath and coughing for about 1 week. Pt says "feels like coughing up concrete". Pt O2 at 70% then progressed to 99% once placed on non-rebreather mask. Pt A&O X4. Lung sounds diminished in all areas with rhonchi noted. Pt also has Hx of COPD.

## 2016-10-13 NOTE — ED Notes (Signed)
Bed: RESA Expected date:  Expected time:  Means of arrival:  Comments: EMS- 71yo M, resp distress/hypoxic

## 2016-10-13 NOTE — ED Provider Notes (Signed)
[facility]-EMERGENCY DEPT Provider Note   CSN: [id number] Arrival date & time: 10/13/16  1927     History   Chief Complaint Chief Complaint  Patient presents with  . Shortness of Breath    HPI Randon GoldsmithMark M Deming is a 71 y.o. male.  HPI 71 yo M with h/o bipolar d/o, HTN, HLD, COPD here with severe SOB. Pt states he has had cough, fever, chills for 1 week. He has had SOB as well that has progressviely worsened over past week. Over the last 24 hours, he has developed diffuse wheezing, dyspnea at rest, and sensation he cannot catch his breath. No CP. No leg swelling. Denies known sick contacts. Has not missed any of his meds.  Past Medical History:  Diagnosis Date  . Bipolar 2 disorder (HCC)   . COPD (chronic obstructive pulmonary disease) (HCC)   . Diabetes mellitus without complication (HCC)   . High cholesterol   . Hypertension   . Mental disorder   . PTSD (post-traumatic stress disorder)   . Schizophrenia [facility](HCC)     Patient Active Problem List   Diagnosis Date Noted  . Acute respiratory failure with hypoxia (HCC) 10/13/2016  . COPD exacerbation (HCC) 10/13/2016  . Essential hypertension 10/13/2016  . Diabetes mellitus without complication (HCC) 10/13/2016  . HLD (hyperlipidemia) 10/13/2016  . Tobacco abuse 10/13/2016  . Dehydration 03/16/2013  . AKI (acute kidney injury) (HCC) 03/16/2013  . Anemia 03/16/2013  . Thrombocytopenia (HCC) 03/16/2013  . Edema of both legs 01/14/2013  . Ulcer of foot (HCC) 12/29/2012  . Bipolar I disorder, most recent episode (or current) manic (HCC) 12/26/2012  . Hyponatremia 12/26/2012  . Diabetes (HCC) 12/26/2012  . 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 Bipolar I disorder (HCC) 12/22/2012  . Transient alteration of awareness 12/22/2012    Past Surgical History:  Procedure Laterality Date  . right side     pt points to R side for surgery       Home Medications    Prior to Admission medications   Medication Sig Start Date End Date Taking?  Authorizing Provider  furosemide (LASIX) 40 MG tablet Take 1 tablet (40 mg total) by mouth daily. For edema. Patient not taking: Reported on 08/08/2015 03/20/13   [name], MD  HYDROcodone-acetaminophen (NORCO/VICODIN) 5-325 MG tablet Take 1-2 tablets by mouth every 6 (six) hours as needed. Patient not taking: Reported on 10/13/2016 08/08/15   Kristen N Ward, DO  lisinopril (PRINIVIL,ZESTRIL) 10 MG tablet Take 1 tablet (10 mg total) by mouth daily. For hypertension. Patient not taking: Reported on 08/08/2015 03/21/13   [name], MD  metFORMIN (GLUCOPHAGE) 500 MG tablet Take 1 tablet (500 mg total) by mouth daily with breakfast. For glycemic control. Patient not taking: Reported on 08/08/2015 01/21/13   Tamala JulianNeil T Mashburn, PA-C  pravastatin (PRAVACHOL) 40 MG tablet Take 1 tablet (40 mg total) by mouth daily. For cholesterol. Patient not taking: Reported on 08/08/2015 01/21/13   Rona RavensNeil T Mashburn, PA-C  QUEtiapine (SEROQUEL) 50 MG tablet Take 2 tablets at bedtime for sleep. Patient not taking: Reported on 08/08/2015 01/21/13   Rona RavensNeil T Mashburn, PA-C  ramelteon (ROZEREM) 8 MG tablet Take 1 tablet (8 mg total) by mouth at bedtime. For insomnia. Patient not taking: Reported on 08/08/2015 01/21/13   Tamala JulianNeil T Mashburn, PA-C  risperiDONE (RISPERDAL) 1 MG tablet Take 1 tablet (1 mg total) by mouth 2 (two) times daily. For mental clarity. Patient not taking: Reported on 08/08/2015 01/21/13   Rona RavensNeil T  Mashburn, PA-C  traZODone (DESYREL) 100 MG tablet Take 2 tablets (200 mg total) by mouth at bedtime. Patient not taking: Reported on 08/08/2015 01/21/13   [name], PA-C    Family History Family History  Problem Relation Age of Onset  . Suicidality Father     Social History Social History  Substance Use Topics  . Smoking status: Light Tobacco Smoker    Packs/day: 0.00    Types: Cigarettes  . Smokeless tobacco: Never Used  . Alcohol use Yes     Comment: 6 pkg weekly     Allergies   Patient  has no known allergies.   Review of Systems Review of Systems  Constitutional: Positive for fatigue. Negative for chills and fever.  HENT: Positive for congestion and sore throat. Negative for rhinorrhea.   Eyes: Negative for visual disturbance.  Respiratory: Positive for cough and shortness of breath. Negative for wheezing.   Cardiovascular: Negative for chest pain and leg swelling.  Gastrointestinal: Negative for abdominal pain, diarrhea, nausea and vomiting.  Genitourinary: Negative for dysuria and flank pain.  Musculoskeletal: Negative for neck pain and neck stiffness.  Skin: Negative for rash and wound.  Allergic/Immunologic: Negative for immunocompromised state.  Neurological: Negative for syncope, weakness and headaches.  All other systems reviewed and are negative.    Physical Exam Updated Vital Signs BP (!) 146/122   Pulse 107   Temp 98.2 F (36.8 C) (Oral)   Resp 20   Ht 6\' 1"  (1.854 m)   Wt 265 lb (120.2 kg)   SpO2 91%   BMI 34.96 kg/m   Physical Exam  Constitutional: He is oriented to person, place, and time. He appears well-developed and well-nourished. He appears distressed.  HENT:  Head: Normocephalic and atraumatic.  Eyes: Conjunctivae are normal.  Neck: Neck supple.  Cardiovascular: Regular rhythm and normal heart sounds.  Tachycardia present.  Exam reveals no friction rub.   No murmur heard. Pulmonary/Chest: Accessory muscle usage present. Tachypnea noted. No respiratory distress. He has decreased breath sounds. He has wheezes in the right upper field, the right middle field, the right lower field, the left upper field, the left middle field and the left lower field. He has no rales.  Abdominal: He exhibits no distension.  Musculoskeletal: He exhibits no edema.  Neurological: He is alert and oriented to person, place, and time. He exhibits normal muscle tone.  Skin: Skin is warm. Capillary refill takes less than 2 seconds.  Psychiatric: He has a normal  mood and affect.  Nursing note and vitals reviewed.    ED Treatments / Results  Labs (all labs ordered are listed, but only abnormal results are displayed) Labs Reviewed  CBC WITH DIFFERENTIAL/PLATELET - Abnormal; Notable for the following:       Result Value   Platelets 132 (*)    Lymphs Abs 0.6 (*)    All other components within normal limits  COMPREHENSIVE METABOLIC PANEL - Abnormal; Notable for the following:    Sodium 133 (*)    Chloride 95 (*)    Glucose, Bld 178 (*)    Calcium 8.5 (*)    Albumin 3.4 (*)    AST 50 (*)    Total Bilirubin 2.1 (*)    All other components within normal limits  INFLUENZA PANEL BY PCR (TYPE A & B) - Abnormal; Notable for the following:    Influenza A By PCR POSITIVE (*)    All other components within normal limits  CULTURE, BLOOD (ROUTINE X  2)  CULTURE, BLOOD (ROUTINE X 2)  CULTURE, EXPECTORATED SPUTUM-ASSESSMENT  GRAM STAIN  BRAIN NATRIURETIC PEPTIDE  RAPID URINE DRUG SCREEN, HOSP PERFORMED  STREP PNEUMONIAE URINARY ANTIGEN  LIPID PANEL  I-STAT TROPOININ, ED    EKG  EKG Interpretation None       Radiology Dg Chest Port 1 View  Result Date: 10/13/2016 CLINICAL DATA:  Flu like symptoms.  Cough. EXAM: PORTABLE CHEST 1 VIEW COMPARISON:  03/16/2013 FINDINGS: The heart size and mediastinal contours are within normal limits. Both lungs are clear. The visualized skeletal structures are unremarkable. Relatively stable thin-walled cavitation in the right mid lung possibly related to remote prior infection. IMPRESSION: No active disease. Again noted in the right mid lung is a thin-walled cavitary lesion possibly related to old remote infection. Electronically Signed   By: Tollie Eth M.D.   On: 10/13/2016 20:27    Procedures Procedures (including critical care time)  Medications Ordered in ED Medications  methylPREDNISolone sodium succinate (SOLU-MEDROL) 125 mg/2 mL injection 60 mg (not administered)  albuterol (PROVENTIL) (2.5 MG/3ML)  0.083% nebulizer solution 2.5 mg (not administered)  dextromethorphan-guaiFENesin (MUCINEX DM) 30-600 MG per 12 hr tablet 1 tablet (1 tablet Oral Given 10/13/16 2355)  azithromycin (ZITHROMAX) tablet 500 mg (500 mg Oral Given 10/13/16 2210)    Followed by  azithromycin (ZITHROMAX) tablet 250 mg (not administered)  nicotine (NICODERM CQ - dosed in mg/24 hours) patch 21 mg (not administered)  0.9 %  sodium chloride infusion ( Intravenous New Bag/Given 10/13/16 2357)  ondansetron (ZOFRAN) injection 4 mg (not administered)  acetaminophen (TYLENOL) tablet 650 mg (not administered)  HYDROcodone-acetaminophen (NORCO/VICODIN) 5-325 MG per tablet 1-2 tablet (not administered)  zolpidem (AMBIEN) tablet 5 mg (5 mg Oral Given 10/14/16 0009)  hydrALAZINE (APRESOLINE) injection 5 mg (not administered)  LORazepam (ATIVAN) injection 0.5 mg (0.5 mg Intravenous Given 10/14/16 0009)  ipratropium-albuterol (DUONEB) 0.5-2.5 (3) MG/3ML nebulizer solution 3 mL (not administered)  magnesium sulfate IVPB 2 g 50 mL (0 g Intravenous Stopped 10/13/16 2215)  methylPREDNISolone sodium succinate (SOLU-MEDROL) 125 mg/2 mL injection 125 mg (125 mg Intravenous Given 10/13/16 2026)  albuterol (PROVENTIL,VENTOLIN) solution continuous neb (10 mg/hr Nebulization Given 10/13/16 2100)  ipratropium (ATROVENT) nebulizer solution 1 mg (1 mg Nebulization Given 10/13/16 2100)  sodium chloride 0.9 % bolus 1,000 mL (0 mLs Intravenous Stopped 10/13/16 2132)  sodium chloride 0.9 % bolus 1,000 mL (1,000 mLs Intravenous New Bag/Given 10/13/16 2212)  oseltamivir (TAMIFLU) capsule 75 mg (75 mg Oral Given 10/13/16 2210)     Initial Impression / Assessment and Plan / ED Course  I have reviewed the triage vital signs and the nursing notes.  Pertinent labs & imaging results that were available during my care of the patient were reviewed by me and considered in my medical decision making (see chart for details).     71 yo M here with hypoxic respiratory  failure, likely 2/2 COPD exacerbation from URI. O2Sats 70 on RA, improved now s/p continuous nebs, mag, steroids. CXR is clear but pt does endorse severe sputum production - will cover with Levaquin. Flu pending. Labs o/w reassuring.   Following continuous, pt remains hypoxic. He is alert, with no signs of CO2 retention or AMS. Protecting airway. Will admit to tele bed for continued treatments, O2. Pt in agreement.  Final Clinical Impressions(s) / ED Diagnoses   Final diagnoses:  COPD exacerbation (HCC)  Acute respiratory failure with hypoxia (HCC)    New Prescriptions New Prescriptions   No medications on file  Shaune Pollack, MD 10/14/16 [phone]

## 2016-10-13 NOTE — H&P (Addendum)
History and Physical    Albert GoldsmithMark M Riolo WUJ:[id number] DOB: May 20, 1946 DOA: 10/13/2016  Referring MD/NP/PA:   PCP: Elvera LennoxGURLEY,Scott, PA-C   Patient coming from:  The patient is coming from home.  At baseline, pt is independent for most of ADL.    Chief Complaint: cough and SOB  HPI: Albert GoldsmithMark M Hess is a 71 y.o. male with medical history significant of COPD, hypertension, hyperlipidemia, diabetes mellitus, PTSD, schizophrenia, bipolar disorder, thrombocytopenia, tobacco abuse, marijuana abuse, medication noncompliance. who presents with shortness of breath and cough.  Patient states that he has been coughing for almost a week, which has been progressively getting worse. He coughs up greenish colored sputum. He has mild subjective fever, and chills. Denies chest pain. He has runny nose, but no sore throat. No sick contacts recently. He speaks in full sentence. Patient denies nausea, vomiting, abdominal pain, diarrhea, symptoms of UTI or unilateral weakness. Patient is not taking any home medications currently.  ED Course: pt was found to have WBC 4.2, negative troponin, BNP 40.6, creatinine normal, temperature normal, mildly tachycardia, oxygen saturation down to 70-87% on room air. CXR has not infiltration, again noted in the right mid lung is a thin-walled cavitary lesion possibly related to old remote infection per radiologist. Patient is admitted to telemetry bed as inpatient.   Review of Systems:   General: has fevers, chills, no changes in body weight, has fatigue HEENT: no blurry vision, hearing changes or sore throat Respiratory: has dyspnea, coughing, wheezing CV: no chest pain, no palpitations GI: no nausea, vomiting, abdominal pain, diarrhea, constipation GU: no dysuria, burning on urination, increased urinary frequency, hematuria  Ext: no leg edema Neuro: no unilateral weakness, numbness, or tingling, no vision change or hearing loss Skin: no rash, no skin tear. MSK: No muscle spasm, no  deformity, no limitation of range of movement in spin Heme: No easy bruising.  Travel history: No recent long distant travel.  Allergy: No Known Allergies  Past Medical History:  Diagnosis Date  . Bipolar 2 disorder (HCC)   . COPD (chronic obstructive pulmonary disease) (HCC)   . Diabetes mellitus without complication (HCC)   . High cholesterol   . Hypertension   . Mental disorder   . PTSD (post-traumatic stress disorder)   . Schizophrenia [facility](HCC)     Past Surgical History:  Procedure Laterality Date  . right side     pt points to R side for surgery    Social History:  reports that he has been smoking Cigarettes.  He has been smoking about 0.00 packs per day. He has never used smokeless tobacco. He reports that he drinks alcohol. He reports that he uses drugs, including Marijuana.  Family History:  Family History  Problem Relation Age of Onset  . Suicidality Father      Prior to Admission medications   Medication Sig Start Date End Date Taking? Authorizing Provider  furosemide (LASIX) 40 MG tablet Take 1 tablet (40 mg total) by mouth daily. For edema. Patient not taking: Reported on 08/08/2015 03/20/13   [name], MD  HYDROcodone-acetaminophen (NORCO/VICODIN) 5-325 MG tablet Take 1-2 tablets by mouth every 6 (six) hours as needed. Patient not taking: Reported on 10/13/2016 08/08/15   Kristen N Ward, DO  lisinopril (PRINIVIL,ZESTRIL) 10 MG tablet Take 1 tablet (10 mg total) by mouth daily. For hypertension. Patient not taking: Reported on 08/08/2015 03/21/13   [name], MD  metFORMIN (GLUCOPHAGE) 500 MG tablet Take 1 tablet (500 mg total) by mouth  daily with breakfast. For glycemic control. Patient not taking: Reported on 08/08/2015 01/21/13   [name], PA-C  pravastatin (PRAVACHOL) 40 MG tablet Take 1 tablet (40 mg total) by mouth daily. For cholesterol. Patient not taking: Reported on 08/08/2015 01/21/13   [name] Mashburn, PA-C  QUEtiapine (SEROQUEL) 50  MG tablet Take 2 tablets at bedtime for sleep. Patient not taking: Reported on 08/08/2015 01/21/13   [name] Mashburn, PA-C  ramelteon (ROZEREM) 8 MG tablet Take 1 tablet (8 mg total) by mouth at bedtime. For insomnia. Patient not taking: Reported on 08/08/2015 01/21/13   [name], PA-C  risperiDONE (RISPERDAL) 1 MG tablet Take 1 tablet (1 mg total) by mouth 2 (two) times daily. For mental clarity. Patient not taking: Reported on 08/08/2015 01/21/13   [name], PA-C  traZODone (DESYREL) 100 MG tablet Take 2 tablets (200 mg total) by mouth at bedtime. Patient not taking: Reported on 08/08/2015 01/21/13   [name], PA-C    Physical Exam: Vitals:   10/14/16 0230 10/14/16 0300 10/14/16 0330 10/14/16 0427  BP: 129/63 120/74 152/86 (!) 149/89  Pulse: 87 89  89  Resp: 19 25 (!) 40 (!) 24  Temp:    97.6 F (36.4 C)  TempSrc:    Oral  SpO2: 94% 95%  94%  Weight:    116.7 kg (257 lb 4.4 oz)  Height:    6\' 1"  (1.854 m)   General: Not in acute distress HEENT:       Eyes: PERRL, EOMI, no scleral icterus.       ENT: No discharge from the ears and nose, no pharynx injection, no tonsillar enlargement.        Neck: No JVD, no bruit, no mass felt. Heme: No neck lymph node enlargement. Cardiac: S1/S2, RRR, No murmurs, No gallops or rubs. Respiratory: Decreased air movement bilaterally. Has mild wheezing bilaterally. No rales or rubs. GI: Soft, nondistended, nontender, no rebound pain, no organomegaly, BS present. GU: No hematuria Ext: No pitting leg edema bilaterally. 2+DP/PT pulse bilaterally. Musculoskeletal: No joint deformities, No joint redness or warmth, no limitation of ROM in spin. Skin: No rashes.  Neuro: Alert, oriented X3, cranial nerves II-XII grossly intact, moves all extremities normally. Psych: Patient is not psychotic, no suicidal or hemocidal ideation.  Labs on Admission: I have personally reviewed following labs and imaging studies  CBC:  Recent Labs Lab  10/13/16 1954  WBC 4.2  NEUTROABS 3.3  HGB 15.6  HCT 43.6  MCV 93.6  PLT 132*   Basic Metabolic Panel:  Recent Labs Lab 10/13/16 1954  NA 133*  K 4.4  CL 95*  CO2 29  GLUCOSE 178*  BUN 17  CREATININE 0.82  CALCIUM 8.5*   GFR: Estimated Creatinine Clearance: 113.8 mL/min (by C-G formula based on SCr of 0.82 mg/dL). Liver Function Tests:  Recent Labs Lab 10/13/16 1954  AST 50*  ALT 33  ALKPHOS 59  BILITOT 2.1*  PROT 6.8  ALBUMIN 3.4*   No results for input(s): LIPASE, AMYLASE in the last 168 hours. No results for input(s): AMMONIA in the last 168 hours. Coagulation Profile: No results for input(s): INR, PROTIME in the last 168 hours. Cardiac Enzymes: No results for input(s): CKTOTAL, CKMB, CKMBINDEX, TROPONINI in the last 168 hours. BNP (last 3 results) No results for input(s): PROBNP in the last 8760 hours. HbA1C: No results for input(s): HGBA1C in the last 72 hours. CBG: No results for input(s): GLUCAP in the last  168 hours. Lipid Profile: No results for input(s): CHOL, HDL, LDLCALC, TRIG, CHOLHDL, LDLDIRECT in the last 72 hours. Thyroid Function Tests: No results for input(s): TSH, T4TOTAL, FREET4, T3FREE, THYROIDAB in the last 72 hours. Anemia Panel: No results for input(s): VITAMINB12, FOLATE, FERRITIN, TIBC, IRON, RETICCTPCT in the last 72 hours. Urine analysis:    Component Value Date/Time   COLORURINE YELLOW 08/08/2015 0359   APPEARANCEUR CLEAR 08/08/2015 0359   LABSPEC 1.007 08/08/2015 0359   PHURINE 6.0 08/08/2015 0359   GLUCOSEU 100 (A) 08/08/2015 0359   HGBUR TRACE (A) 08/08/2015 0359   BILIRUBINUR NEGATIVE 08/08/2015 0359   KETONESUR NEGATIVE 08/08/2015 0359   PROTEINUR NEGATIVE 08/08/2015 0359   UROBILINOGEN 0.2 03/18/2013 0050   NITRITE NEGATIVE 08/08/2015 0359   LEUKOCYTESUR NEGATIVE 08/08/2015 0359   Sepsis Labs: @LABRCNTIP (procalcitonin:4,lacticidven:4) )No results found for this or any previous visit (from the past 240  hour(s)).   Radiological Exams on Admission: Dg Chest Port 1 View  Result Date: 10/13/2016 CLINICAL DATA:  Flu like symptoms.  Cough. EXAM: PORTABLE CHEST 1 VIEW COMPARISON:  03/16/2013 FINDINGS: The heart size and mediastinal contours are within normal limits. Both lungs are clear. The visualized skeletal structures are unremarkable. Relatively stable thin-walled cavitation in the right mid lung possibly related to remote prior infection. IMPRESSION: No active disease. Again noted in the right mid lung is a thin-walled cavitary lesion possibly related to old remote infection. Electronically Signed   By: Tollie Eth M.D.   On: 10/13/2016 20:27     EKG: Independently reviewed.  Sinus rhythm, QTC 468, low voltage  Assessment/Plan Principal Problem:   Acute respiratory failure with hypoxia (HCC) Active Problems:   Bipolar I disorder (HCC)   Thrombocytopenia (HCC)   COPD exacerbation (HCC)   Essential hypertension   Diabetes mellitus without complication (HCC)   HLD (hyperlipidemia)   Tobacco abuse   Influenza A   Acute respiratory failure with hypoxia due to COPD exacerbation: Chest x-ray has no infiltration. BNP 40, no CHF. Patient has productive cough, shortness breath, and decreased air movement/wheezing, consistent with COPD exacerbation.  -will admit patient to telemetry bed  -Nebulizers: scheduled Duoneb and prn albuterol -Solu-Medrol 60 mg IV tid -Oral azithromycin for 5 days.  -Mucinex for cough  -Urine S. pneumococcal antigen -Follow up blood culture x2, sputum culture -give one dose of tamiflu and pending flu pcr  Addendum: Flu pcr positive for Flu A -will continue tamiflu 75 mg  bid.   Thrombocytopenia: this is a chronic issue with unclear etiology. Platelet 132. -f/u by CBC  HTN: pt is not taking his Lasix, lisinopril. Blood pressure normal 126/77 -IV hydralazine when necessary  HLD: Last LDL was noted on record. Patient is not taking his pravastatin -Check  FLP  Bipolar I disorder (HCC): not taking home meds. Stable -prn ativan  DM-II: Last A1c 5.7, well controled. Patient is not taking his home metformin  at home. Blood sugar was 178 -SSI  Tobacco abuse: -Did counseling about importance of quitting smoking -Nicotine patch  DVT ppx: SCD Code Status: Full code Family Communication: None at bed side. Disposition Plan:  Anticipate discharge back to previous home environment Consults called:  none Admission status: Inpatient/tele    Date of Service 10/14/2016    Lorretta Harp [facility] Pager [phone]  If 7PM-7AM, please contact night-coverage [email or web address] Password [facility] 10/14/2016, 5:33 AM

## 2016-10-14 DIAGNOSIS — J9601 Acute respiratory failure with hypoxia: Secondary | ICD-10-CM

## 2016-10-14 DIAGNOSIS — E785 Hyperlipidemia, unspecified: Secondary | ICD-10-CM

## 2016-10-14 DIAGNOSIS — I10 Essential (primary) hypertension: Secondary | ICD-10-CM

## 2016-10-14 DIAGNOSIS — J441 Chronic obstructive pulmonary disease with (acute) exacerbation: Secondary | ICD-10-CM

## 2016-10-14 DIAGNOSIS — J101 Influenza due to other identified influenza virus with other respiratory manifestations: Secondary | ICD-10-CM | POA: Diagnosis present

## 2016-10-14 DIAGNOSIS — D696 Thrombocytopenia, unspecified: Secondary | ICD-10-CM

## 2016-10-14 LAB — LIPID PANEL
CHOL/HDL RATIO: 5.1 ratio
CHOLESTEROL: 113 mg/dL (ref 0–200)
HDL: 22 mg/dL — ABNORMAL LOW (ref >40)
LDL Cholesterol: 75 mg/dL (ref 0–99)
TRIGLYCERIDES: 79 mg/dL (ref <150)
VLDL: 16 mg/dL (ref 0–40)

## 2016-10-14 LAB — RAPID URINE DRUG SCREEN, HOSP PERFORMED
AMPHETAMINES: NOT DETECTED
Barbiturates: NOT DETECTED
Benzodiazepines: NOT DETECTED
Cocaine: NOT DETECTED
Opiates: NOT DETECTED
Tetrahydrocannabinol: NOT DETECTED

## 2016-10-14 LAB — GLUCOSE, CAPILLARY
Glucose-Capillary: 265 mg/dL — ABNORMAL HIGH (ref 65–99)
Glucose-Capillary: 227 mg/dL — ABNORMAL HIGH (ref 65–99)

## 2016-10-14 LAB — EXPECTORATED SPUTUM ASSESSMENT W GRAM STAIN, RFLX TO RESP C

## 2016-10-14 LAB — STREP PNEUMONIAE URINARY ANTIGEN: STREP PNEUMO URINARY ANTIGEN: NEGATIVE

## 2016-10-14 LAB — EXPECTORATED SPUTUM ASSESSMENT W REFEX TO RESP CULTURE

## 2016-10-14 LAB — INFLUENZA PANEL BY PCR (TYPE A & B)
Influenza A By PCR: POSITIVE — AB
Influenza B By PCR: NEGATIVE

## 2016-10-14 MED ORDER — FOLIC ACID 1 MG PO TABS
1.0000 mg | ORAL_TABLET | Freq: Every day | ORAL | Status: DC
  Administered 2016-10-14 – 2016-10-18 (×5): 1 mg via ORAL
  Filled 2016-10-14 (×5): qty 1

## 2016-10-14 MED ORDER — ADULT MULTIVITAMIN W/MINERALS CH
1.0000 | ORAL_TABLET | Freq: Every day | ORAL | Status: DC
  Administered 2016-10-14 – 2016-10-18 (×5): 1 via ORAL
  Filled 2016-10-14 (×5): qty 1

## 2016-10-14 MED ORDER — VITAMIN B-1 100 MG PO TABS
100.0000 mg | ORAL_TABLET | Freq: Every day | ORAL | Status: DC
  Administered 2016-10-14 – 2016-10-18 (×5): 100 mg via ORAL
  Filled 2016-10-14 (×5): qty 1

## 2016-10-14 MED ORDER — LORAZEPAM 1 MG PO TABS: 1.0000 mg | ORAL_TABLET | Freq: Four times a day (QID) | ORAL | Status: AC | PRN

## 2016-10-14 MED ORDER — OSELTAMIVIR PHOSPHATE 75 MG PO CAPS: 75.0000 mg | ORAL_CAPSULE | Freq: Two times a day (BID) | ORAL | Status: DC

## 2016-10-14 MED ORDER — THIAMINE HCL 100 MG/ML IJ SOLN: 100.0000 mg | Freq: Every day | INTRAMUSCULAR | Status: DC

## 2016-10-14 MED ORDER — OSELTAMIVIR PHOSPHATE 75 MG PO CAPS
75.0000 mg | ORAL_CAPSULE | Freq: Two times a day (BID) | ORAL | Status: DC
  Administered 2016-10-14 – 2016-10-18 (×9): 75 mg via ORAL
  Filled 2016-10-14 (×10): qty 1

## 2016-10-14 MED ORDER — LORAZEPAM 2 MG/ML IJ SOLN: 1.0000 mg | Freq: Four times a day (QID) | INTRAMUSCULAR | Status: AC | PRN

## 2016-10-14 MED ORDER — ENOXAPARIN SODIUM 40 MG/0.4ML ~~LOC~~ SOLN
40.0000 mg | SUBCUTANEOUS | Status: DC
  Administered 2016-10-14 – 2016-10-17 (×4): 40 mg via SUBCUTANEOUS
  Filled 2016-10-14 (×4): qty 0.4

## 2016-10-14 MED ORDER — INSULIN ASPART 100 UNIT/ML ~~LOC~~ SOLN
0.0000 [IU] | Freq: Three times a day (TID) | SUBCUTANEOUS | Status: DC
  Administered 2016-10-14: 5 [IU] via SUBCUTANEOUS
  Administered 2016-10-15: 2 [IU] via SUBCUTANEOUS

## 2016-10-14 NOTE — Evaluation (Signed)
Physical Therapy Evaluation Patient Details Name: Albert GoldsmithMark M Doberstein MRN: [id number] DOB: July 22, 1946 Today's Date: 10/14/2016   History of Present Illness  71 y.o. male with medical history significant of COPD, hypertension, hyperlipidemia, diabetes mellitus, PTSD, schizophrenia, bipolar disorder, thrombocytopenia, tobacco abuse, marijuana abuse, medication noncompliance and admitted for Acute respiratory failure with hypoxia due to COPD exacerbation; found to be positive for Flu A  Clinical Impression  Pt admitted with above diagnosis. Pt currently with functional limitations due to the deficits listed below (see PT Problem List).  Pt will benefit from skilled PT to increase their independence and safety with mobility to allow discharge to the venue listed below.  Pt unable to tolerate mobility at this time.  Pt assisted to sitting EOB and had continuous coughing.  SPO2 91% on 3L O2 [place].  Pt declined ambulating and requested return to supine.  Pt's mobility will likely improve with improvement in medical status.  Will continue to follow acutely.     Follow Up Recommendations Home health PT (possibly HHPT, may progress to no needs)    Equipment Recommendations  Other (comment) (likely no needs however will update with improved mobility)    Recommendations for Other Services       Precautions / Restrictions Precautions Precautions: Fall Precaution Comments: monitor sats      Mobility  Bed Mobility Overal bed mobility: Needs Assistance Bed Mobility: Supine to Sit;Sit to Supine     Supine to sit: Min assist;HOB elevated Sit to supine: Supervision   General bed mobility comments: assist for trunk upright  Transfers                 General transfer comment: pt declined due to not feeling well, coughing spells with sitting EOB, SPO2 91% on 3L O2, requested return to supine  Ambulation/Gait                Stairs            Wheelchair Mobility    Modified Rankin  (Stroke Patients Only)       Balance                                             Pertinent Vitals/Pain Pain Assessment: No/denies pain    Home Living Family/patient expects to be discharged to:: Private residence Living Arrangements: Alone     Home Access: Ramped entrance     Home Layout: One level Home Equipment: None      Prior Function Level of Independence: Independent         Comments: pt states he has lived in ALF however currently not but considering moving back into ALF again     Hand Dominance        Extremity/Trunk Assessment        Lower Extremity Assessment Lower Extremity Assessment: Generalized weakness       Communication   Communication: No difficulties  Cognition Arousal/Alertness: Awake/alert Behavior During Therapy: WFL for tasks assessed/performed Overall Cognitive Status: Within Functional Limits for tasks assessed                      General Comments      Exercises     Assessment/Plan    PT Assessment Patient needs continued PT services  PT Problem List Decreased strength;Decreased activity tolerance;Decreased knowledge of use of DME;Decreased mobility;Cardiopulmonary status limiting  activity          PT Treatment Interventions DME instruction;Gait training;Functional mobility training;Therapeutic exercise;Therapeutic activities;Patient/family education    PT Goals (Current goals can be found in the Care Plan section)  Acute Rehab PT Goals PT Goal Formulation: With patient Time For Goal Achievement: 10/21/16 Potential to Achieve Goals: Good    Frequency Min 3X/week   Barriers to discharge        Co-evaluation               End of Session Equipment Utilized During Treatment: Oxygen Activity Tolerance: Patient limited by fatigue Patient left: in bed;with call bell/phone within reach;with bed alarm set           Time: 1610-9604 PT Time Calculation (min) (ACUTE ONLY): 13  min   Charges:   PT Evaluation $PT Eval Low Complexity: 1 Procedure     PT G Codes:        Eilyn Polack,KATHrine E 10/14/2016, 11:21 AM Zenovia Jarred, PT, DPT 10/14/2016 Pager: [phone]

## 2016-10-14 NOTE — Progress Notes (Signed)
PROGRESS NOTE  Albert Hess  ZOX:[id number] DOB: 03-24-1946  DOA: 10/13/2016 PCP: Delphia Grates   Brief Narrative:  71 y.o. male with medical history significant of COPD, hypertension, hyperlipidemia, diabetes mellitus, PTSD, schizophrenia, bipolar disorder, thrombocytopenia, tobacco abuse, marijuana abuse, ? Alcohol abuse, medication noncompliance. who presented with shortness of breath and productive cough. Admitted for acute respiratory failure due to COPD exacerbation precipitated by influenza acute bronchitis. Patient was hypoxic 70-87 percent on room air in the ED.  Assessment & Plan:   Principal Problem:   Acute respiratory failure with hypoxia (HCC) Active Problems:   Bipolar I disorder (HCC)   Thrombocytopenia (HCC)   COPD exacerbation (HCC)   Essential hypertension   Diabetes mellitus without complication (HCC)   HLD (hyperlipidemia)   Tobacco abuse   Influenza A   1. Influenza A acute bronchitis with COPD exacerbation: Influenza A PCR positive. Chest x-ray shows no acute disease but has chronic findings as outlined below. No clinical or radiological CHF. Treating empirically with Tamiflu, azithromycin, Solu-Medrol, oxygen, rhonchi or dilator nebulizations. Clinically improving. Urine pneumococcal antigen negative. Blood cultures 2: Pending. Add flutter valve. 2. Acute respiratory failure with hypoxia: Secondary to problem #1. Did not use home oxygen prior to admission. Wean oxygen as tolerated for saturations >88%. 3. Essential hypertension: Mildly uncontrolled. Apparently noncompliant with Lasix and lisinopril at home. When necessary IV hydralazine. 4. Hyperlipidemia: Noncompliant with statins. 5. Bipolar 1 disorder: Apparently not taking medications at home. Seems stable without symptoms currently. 6. Type II DM: Last A1c: 5.7. Not taking metformin at home. Monitor CBGs and place on SSI as needed while on IV Solu-Medrol. 7. Thrombocytopenia, chronic mild and  intermittent: Seems stable. Follow CBCs periodically. 8. Polysubstance abuse (current THC, former smoker,? Alcohol): UDS negative. CIWA protocol. States that he drinks half a pint of vodka every 4 days but has not consumed in 2 weeks due to acute illness. 9. Abnormal chest x-ray: Right mid lung thin-walled cavitary lesion-chronic finding. Outpatient follow-up and evaluation as deemed necessary.   DVT prophylaxis: SCD's. Add Lovenox Code Status: Full Family Communication: None at bedside Disposition Plan: DC home when medically stable.   Consultants:   None  Procedures:   None  Antimicrobials:   Azithromycin    Subjective: Feels much better. Dyspnea significantly improved but not at baseline. He states that he has not smoked for years. Last used marijuana 1 weeks ago. Also claims that he has not had alcohol in about 2 weeks. Mild dry cough. No chest pain. No suicidal or homicidal ideations or auditory or visual hallucinations.  Objective:  Vitals:   10/14/16 0330 10/14/16 0427 10/14/16 0640 10/14/16 0739  BP: 152/86 (!) 149/89 (!) 153/81   Pulse:  89 100 87  Resp: (!) 40 (!) 24 (!) 21 18  Temp:  97.6 F (36.4 C) 97.8 F (36.6 C)   TempSrc:  Oral Oral   SpO2:  94% 93% 94%  Weight:  116.7 kg (257 lb 4.4 oz)    Height:  6\' 1"  (1.854 m)      Intake/Output Summary (Last 24 hours) at 10/14/16 1343 Last data filed at 10/14/16 0830  Gross per 24 hour  Intake          1236.25 ml  Output                0 ml  Net          1236.25 ml   Filed Weights   10/13/16 1935 10/14/16 0427  Weight:  120.2 kg (265 lb) 116.7 kg (257 lb 4.4 oz)    Examination:  General exam: Pleasant middle-aged male sitting up comfortably in bed eating his breakfast this morning. Respiratory system: Slightly harsh breath sounds bilaterally with scattered few expiratory rhonchi but no crackles. Respiratory effort normal. Cardiovascular system: S1 & S2 heard, RRR. No JVD, murmurs, rubs, gallops or  clicks. No pedal edema. Telemetry: Sinus tachycardia in the 100s. Gastrointestinal system: Abdomen is nondistended, soft and nontender. No organomegaly or masses felt. Normal bowel sounds heard. Central nervous system: Alert and oriented. No focal neurological deficits. Extremities: Symmetric 5 x 5 power. Skin: No rashes, lesions or ulcers Psychiatry: Judgement and insight appear normal. Mood & affect appropriate.     Data Reviewed: I have personally reviewed following labs and imaging studies  CBC:  Recent Labs Lab 10/13/16 1954  WBC 4.2  NEUTROABS 3.3  HGB 15.6  HCT 43.6  MCV 93.6  PLT 132*   Basic Metabolic Panel:  Recent Labs Lab 10/13/16 1954  NA 133*  K 4.4  CL 95*  CO2 29  GLUCOSE 178*  BUN 17  CREATININE 0.82  CALCIUM 8.5*   GFR: Estimated Creatinine Clearance: 113.8 mL/min (by C-G formula based on SCr of 0.82 mg/dL). Liver Function Tests:  Recent Labs Lab 10/13/16 1954  AST 50*  ALT 33  ALKPHOS 59  BILITOT 2.1*  PROT 6.8  ALBUMIN 3.4*   No results for input(s): LIPASE, AMYLASE in the last 168 hours. No results for input(s): AMMONIA in the last 168 hours. Coagulation Profile: No results for input(s): INR, PROTIME in the last 168 hours. Cardiac Enzymes: No results for input(s): CKTOTAL, CKMB, CKMBINDEX, TROPONINI in the last 168 hours. BNP (last 3 results) No results for input(s): PROBNP in the last 8760 hours. HbA1C: No results for input(s): HGBA1C in the last 72 hours. CBG: No results for input(s): GLUCAP in the last 168 hours. Lipid Profile:  Recent Labs  10/14/16 0559  CHOL 113  HDL 22*  LDLCALC 75  TRIG 79  CHOLHDL 5.1   Thyroid Function Tests: No results for input(s): TSH, T4TOTAL, FREET4, T3FREE, THYROIDAB in the last 72 hours. Anemia Panel: No results for input(s): VITAMINB12, FOLATE, FERRITIN, TIBC, IRON, RETICCTPCT in the last 72 hours.  Sepsis Labs:  Recent Labs Lab 10/13/16 1954  WBC 4.2    No results found for  this or any previous visit (from the past 240 hour(s)).       Radiology Studies: Dg Chest Port 1 View  Result Date: 10/13/2016 CLINICAL DATA:  Flu like symptoms.  Cough. EXAM: PORTABLE CHEST 1 VIEW COMPARISON:  03/16/2013 FINDINGS: The heart size and mediastinal contours are within normal limits. Both lungs are clear. The visualized skeletal structures are unremarkable. Relatively stable thin-walled cavitation in the right mid lung possibly related to remote prior infection. IMPRESSION: No active disease. Again noted in the right mid lung is a thin-walled cavitary lesion possibly related to old remote infection. Electronically Signed   By: Tollie Eth M.D.   On: 10/13/2016 20:27        Scheduled Meds: . azithromycin  250 mg Oral Q24H  . dextromethorphan-guaiFENesin  1 tablet Oral BID  . folic acid  1 mg Oral Daily  . ipratropium-albuterol  3 mL Nebulization TID  . methylPREDNISolone (SOLU-MEDROL) injection  60 mg Intravenous TID  . multivitamin with minerals  1 tablet Oral Daily  . nicotine  21 mg Transdermal Daily  . oseltamivir  75 mg Oral BID  .  thiamine  100 mg Oral Daily   Or  . thiamine  100 mg Intravenous Daily   Continuous Infusions: . sodium chloride 125 mL/hr at 10/14/16 0647     LOS: 1 day       [facility] HospitalNGALGI,Huck Ashworth, MD [facility] Pager [phone]  If 7PM-7AM, please contact night-coverage [email or web address] Password [email or web address] 10/14/2016, 1:43 PM

## 2016-10-14 NOTE — Progress Notes (Signed)
Initial Nutrition Assessment  DOCUMENTATION CODES:   Obesity unspecified  INTERVENTION:  - Continue to encourage PO intakes.  - RD will continue to monitor for additional nutrition-related needs.  NUTRITION DIAGNOSIS:   Increased nutrient needs related to chronic illness (COPD) as evidenced by estimated needs (for protein).  GOAL:   Patient will meet greater than or equal to 90% of their needs  MONITOR:   PO intake, Weight trends, Labs, I & O's  REASON FOR ASSESSMENT:   Consult Assessment of nutrition requirement/status  ASSESSMENT:   71 y.o. male with medical history significant of COPD, hypertension, hyperlipidemia, diabetes mellitus, PTSD, schizophrenia, bipolar disorder, thrombocytopenia, tobacco abuse, marijuana abuse, medication noncompliance. who presents with shortness of breath and cough. Patient states that he has been coughing for almost a week, which has been progressively getting worse. He coughs up greenish colored sputum. He has mild subjective fever, and chills. Denies chest pain. He has runny nose, but no sore throat. No sick contacts recently. He speaks in full sentence. Patient denies nausea, vomiting, abdominal pain, diarrhea, symptoms of UTI or unilateral weakness. Patient is not taking any home medications currently.  Pt seen for consult. BMI indicates obesity. Pt consumed 100% of breakfast this AM (omelet, banana, whole wheat toast, and coffee) without any abdominal pain or nausea. He has also ordered lunch (grilled chicken salad, baked potato, and iced tea). Pt denies any abdominal pain or nausea/vomiting PTA. He was having some weakness and fatigue.   Physical assessment shows no muscle and no fat wasting; no edema at this time. No recent weight hx available and pt is unsure of overall weight changes in the past few months.  Medications reviewed; 1 mg oral folic acid/day, 2 g IV Mg sulfate x1 dose yesterday, daily multivitamin with minerals, PRN IV Zofran,  100 mg oral thiamine. Labs reviewed; Na: 133 mmol/L, Cl: 95 mmol/L, Ca: 8.5 mg/dL, AST elevated.  IVF: NS @ 125 mL/hr.      Diet Order:  Diet heart healthy/carb modified Room service appropriate? Yes; Fluid consistency: Thin  Skin:     Last BM:  1/25  Height:   Ht Readings from Last 1 Encounters:  10/14/16 6\' 1"  (1.854 m)    Weight:   Wt Readings from Last 1 Encounters:  10/14/16 257 lb 4.4 oz (116.7 kg)    Ideal Body Weight:  83.64 kg  BMI:  Body mass index is 33.94 kg/m.  Estimated Nutritional Needs:   Kcal:  1750- 1985 (15-17 kcal/kg)  Protein:  100-117 grams (1.2-1.4 grams/kg IBW)  Fluid:  1.8-2 L/day  EDUCATION NEEDS:   No education needs identified at this time    Trenton GammonJessica Saanya Zieske, MS, RD, LDN, CNSC Inpatient Clinical Dietitian Pager # [phone] After hours/weekend pager # [phone]

## 2016-10-14 NOTE — ED Notes (Addendum)
Per crystal in lab patient positive Flu A, notified MD and RN.

## 2016-10-14 NOTE — Progress Notes (Signed)
CSW received consult for COPD Gold Protocol, though patient does not meet qualifications (only 1 admission within the past 6 months).   CSW signing off.   Liela Rylee, LCSW [facility] Clinical Social Worker cell #: [phone]    

## 2016-10-15 LAB — COMPREHENSIVE METABOLIC PANEL
ALT: 35 U/L (ref 17–63)
AST: 34 U/L (ref 15–41)
Albumin: 3.1 g/dL — ABNORMAL LOW (ref 3.5–5.0)
Alkaline Phosphatase: 51 U/L (ref 38–126)
Anion gap: 6 (ref 5–15)
BILIRUBIN TOTAL: 1 mg/dL (ref 0.3–1.2)
BUN: 18 mg/dL (ref 6–20)
CO2: 29 mmol/L (ref 22–32)
Calcium: 8.2 mg/dL — ABNORMAL LOW (ref 8.9–10.3)
Chloride: 100 mmol/L — ABNORMAL LOW (ref 101–111)
Creatinine, Ser: 0.83 mg/dL (ref 0.61–1.24)
Glucose, Bld: 228 mg/dL — ABNORMAL HIGH (ref 65–99)
POTASSIUM: 5 mmol/L (ref 3.5–5.1)
Sodium: 135 mmol/L (ref 135–145)
Total Protein: 5.7 g/dL — ABNORMAL LOW (ref 6.5–8.1)

## 2016-10-15 LAB — GLUCOSE, CAPILLARY
GLUCOSE-CAPILLARY: 193 mg/dL — AB (ref 65–99)
GLUCOSE-CAPILLARY: 214 mg/dL — AB (ref 65–99)
GLUCOSE-CAPILLARY: 249 mg/dL — AB (ref 65–99)
Glucose-Capillary: 251 mg/dL — ABNORMAL HIGH (ref 65–99)

## 2016-10-15 LAB — CBC
HEMATOCRIT: 40.5 % (ref 39.0–52.0)
Hemoglobin: 13.5 g/dL (ref 13.0–17.0)
MCH: 32.1 pg (ref 26.0–34.0)
MCHC: 33.3 g/dL (ref 30.0–36.0)
MCV: 96.2 fL (ref 78.0–100.0)
Platelets: 144 10*3/uL — ABNORMAL LOW (ref 150–400)
RBC: 4.21 MIL/uL — ABNORMAL LOW (ref 4.22–5.81)
RDW: 14.1 % (ref 11.5–15.5)
WBC: 5.7 10*3/uL (ref 4.0–10.5)

## 2016-10-15 MED ORDER — PRAVASTATIN SODIUM 40 MG PO TABS
40.0000 mg | ORAL_TABLET | Freq: Every day | ORAL | Status: DC
  Administered 2016-10-15 – 2016-10-18 (×4): 40 mg via ORAL
  Filled 2016-10-15 (×4): qty 1

## 2016-10-15 MED ORDER — BENZONATATE 100 MG PO CAPS
200.0000 mg | ORAL_CAPSULE | Freq: Three times a day (TID) | ORAL | Status: DC
  Administered 2016-10-15 – 2016-10-18 (×10): 200 mg via ORAL
  Filled 2016-10-15 (×11): qty 2

## 2016-10-15 MED ORDER — LISINOPRIL 10 MG PO TABS
10.0000 mg | ORAL_TABLET | Freq: Every day | ORAL | Status: DC
  Administered 2016-10-15: 10 mg via ORAL
  Filled 2016-10-15: qty 1

## 2016-10-15 MED ORDER — INSULIN ASPART 100 UNIT/ML ~~LOC~~ SOLN
0.0000 [IU] | Freq: Every day | SUBCUTANEOUS | Status: DC
  Administered 2016-10-15 – 2016-10-16 (×2): 2 [IU] via SUBCUTANEOUS
  Administered 2016-10-17: 3 [IU] via SUBCUTANEOUS

## 2016-10-15 MED ORDER — FLUTICASONE PROPIONATE 50 MCG/ACT NA SUSP
2.0000 | Freq: Every day | NASAL | Status: DC
  Administered 2016-10-15 – 2016-10-18 (×4): 2 via NASAL
  Filled 2016-10-15: qty 16

## 2016-10-15 MED ORDER — HYDROCOD POLST-CPM POLST ER 10-8 MG/5ML PO SUER: 5.0000 mL | Freq: Two times a day (BID) | ORAL | Status: DC | PRN

## 2016-10-15 MED ORDER — INSULIN ASPART 100 UNIT/ML ~~LOC~~ SOLN
0.0000 [IU] | Freq: Three times a day (TID) | SUBCUTANEOUS | Status: DC
  Administered 2016-10-15: 5 [IU] via SUBCUTANEOUS
  Administered 2016-10-15 – 2016-10-16 (×3): 3 [IU] via SUBCUTANEOUS
  Administered 2016-10-16: 7 [IU] via SUBCUTANEOUS
  Administered 2016-10-17: 3 [IU] via SUBCUTANEOUS
  Administered 2016-10-17: 2 [IU] via SUBCUTANEOUS
  Administered 2016-10-17: 5 [IU] via SUBCUTANEOUS
  Administered 2016-10-18: 3 [IU] via SUBCUTANEOUS
  Administered 2016-10-18: 2 [IU] via SUBCUTANEOUS
  Administered 2016-10-18: 5 [IU] via SUBCUTANEOUS

## 2016-10-15 MED ORDER — GUAIFENESIN-DM 100-10 MG/5ML PO SYRP
10.0000 mL | ORAL_SOLUTION | ORAL | Status: DC | PRN
  Administered 2016-10-15 – 2016-10-16 (×3): 10 mL via ORAL
  Filled 2016-10-15 (×3): qty 10

## 2016-10-15 MED ORDER — GUAIFENESIN-DM 100-10 MG/5ML PO SYRP: 5.0000 mL | ORAL_SOLUTION | ORAL | Status: DC | PRN

## 2016-10-15 MED ORDER — METHYLPREDNISOLONE SODIUM SUCC 125 MG IJ SOLR
60.0000 mg | Freq: Two times a day (BID) | INTRAMUSCULAR | Status: DC
  Administered 2016-10-15 – 2016-10-16 (×2): 60 mg via INTRAVENOUS
  Filled 2016-10-15 (×2): qty 2

## 2016-10-15 NOTE — Progress Notes (Signed)
Patient c/o coughing spell stating 'i can't catch my breath'.  Patient had long, continuous, strong, hoarse, congested cough x almost 45 minutes.  Sats were 85% on 3L.  Administered tessalon pearls and cough syrup to patient and received order for flonase, humidifier placed on 02 currently at 6L.  Patient sats are 92% on 6L. RT and MD aware.

## 2016-10-15 NOTE — Progress Notes (Signed)
PROGRESS NOTE  Randon GoldsmithMark M Cotrell  ZOX:[id number] DOB: 17-Mar-1946  DOA: 10/13/2016 PCP: Delphia GratesGURLEY,Scott, PA-C   Brief Narrative:  71 y.o. male with medical history significant of COPD, hypertension, hyperlipidemia, diabetes mellitus, PTSD, schizophrenia, bipolar disorder, thrombocytopenia, tobacco abuse, marijuana abuse, ? Alcohol abuse, medication noncompliance. who presented with shortness of breath and productive cough. Admitted for acute respiratory failure due to COPD exacerbation precipitated by influenza acute bronchitis. Patient was hypoxic 70-87 percent on room air in the ED.  Assessment & Plan:   Principal Problem:   Acute respiratory failure with hypoxia (HCC) Active Problems:   Bipolar I disorder (HCC)   Thrombocytopenia (HCC)   COPD exacerbation (HCC)   Essential hypertension   Diabetes mellitus without complication (HCC)   HLD (hyperlipidemia)   Tobacco abuse   Influenza A   1. Influenza A acute bronchitis with COPD exacerbation: Influenza A PCR positive. Chest x-ray shows no acute disease but has chronic findings as outlined below. No clinical or radiological CHF. Treating empirically with Tamiflu, azithromycin, Solu-Medrol, oxygen, rhonchi or dilator nebulizations. Urine pneumococcal antigen negative. Blood cultures 2: Negative to date. Sputum culture pending. Added flutter valve. Improving. Start tapering steroids. 2. Acute respiratory failure with hypoxia: Secondary to problem #1. Did not use home oxygen prior to admission. Wean oxygen as tolerated for saturations >88%. 3. Essential hypertension: Mildly uncontrolled. Apparently noncompliant with Lasix and lisinopril at home. When necessary IV hydralazine. Resume lisinopril. 4. Hyperlipidemia: Noncompliant with statins. 5. Bipolar 1 disorder: Apparently not taking medications at home. Seems stable without symptoms currently. 6. Type II DM: Last A1c: 5.7. Not taking metformin at home. Monitor CBGs and place on SSI as needed while on  IV Solu-Medrol. 7. Thrombocytopenia, chronic mild and intermittent: Seems stable. Follow CBCs periodically. 8. Polysubstance abuse (current THC, former smoker,? Alcohol): UDS negative. CIWA protocol. States that he drinks half a pint of vodka every 4 days but has not consumed in 2 weeks due to acute illness. 9. Abnormal chest x-ray: Right mid lung thin-walled cavitary lesion-chronic finding. Outpatient follow-up and evaluation as deemed necessary.   DVT prophylaxis: SCD's. Add Lovenox Code Status: Full Family Communication: None at bedside Disposition Plan: DC home when medically stable, possibly in 2-3 days.   Consultants:   None  Procedures:   None  Antimicrobials:   Azithromycin    Subjective: Continues to feel better. Dyspnea improving. Still having periods of intermittent hacking dry cough. No chest pain.  Objective:  Vitals:   10/14/16 1928 10/14/16 2348 10/15/16 0609 10/15/16 0746  BP:  (!) 174/92 (!) 192/92   Pulse:  (!) 107 (!) 109   Resp:  (!) 22 (!) 22   Temp:  97.4 F (36.3 C) 97.7 F (36.5 C)   TempSrc:  Axillary Oral   SpO2: 94% 96% 94% 94%  Weight:      Height:        Intake/Output Summary (Last 24 hours) at 10/15/16 1157 Last data filed at 10/15/16 0900  Gross per 24 hour  Intake             1480 ml  Output              600 ml  Net              880 ml   Filed Weights   10/13/16 1935 10/14/16 0427  Weight: 120.2 kg (265 lb) 116.7 kg (257 lb 4.4 oz)    Examination:  General exam: Pleasant middle-aged male lying comfortably supine in bed.  Respiratory system: Improved breath sounds bilaterally with occasional expiratory rhonchi. Respiratory effort normal. Cardiovascular system: S1 & S2 heard, RRR. No JVD, murmurs, rubs, gallops or clicks. No pedal edema. Telemetry: Sinus rhythm. Gastrointestinal system: Abdomen is nondistended, soft and nontender. No organomegaly or masses felt. Normal bowel sounds heard. Central nervous system: Alert and  oriented. No focal neurological deficits. Extremities: Symmetric 5 x 5 power. Skin: No rashes, lesions or ulcers Psychiatry: Judgement and insight appear normal. Mood & affect appropriate.     Data Reviewed: I have personally reviewed following labs and imaging studies  CBC:  Recent Labs Lab 10/13/16 1954 10/15/16 0544  WBC 4.2 5.7  NEUTROABS 3.3  --   HGB 15.6 13.5  HCT 43.6 40.5  MCV 93.6 96.2  PLT 132* 144*   Basic Metabolic Panel:  Recent Labs Lab 10/13/16 1954 10/15/16 0544  NA 133* 135  K 4.4 5.0  CL 95* 100*  CO2 29 29  GLUCOSE 178* 228*  BUN 17 18  CREATININE 0.82 0.83  CALCIUM 8.5* 8.2*   GFR: Estimated Creatinine Clearance: 112.4 mL/min (by C-G formula based on SCr of 0.83 mg/dL). Liver Function Tests:  Recent Labs Lab 10/13/16 1954 10/15/16 0544  AST 50* 34  ALT 33 35  ALKPHOS 59 51  BILITOT 2.1* 1.0  PROT 6.8 5.7*  ALBUMIN 3.4* 3.1*   No results for input(s): LIPASE, AMYLASE in the last 168 hours. No results for input(s): AMMONIA in the last 168 hours. Coagulation Profile: No results for input(s): INR, PROTIME in the last 168 hours. Cardiac Enzymes: No results for input(s): CKTOTAL, CKMB, CKMBINDEX, TROPONINI in the last 168 hours. BNP (last 3 results) No results for input(s): PROBNP in the last 8760 hours. HbA1C: No results for input(s): HGBA1C in the last 72 hours. CBG:  Recent Labs Lab 10/14/16 1821 10/14/16 2136 10/15/16 0735  GLUCAP 265* 227* 193*   Lipid Profile:  Recent Labs  10/14/16 0559  CHOL 113  HDL 22*  LDLCALC 75  TRIG 79  CHOLHDL 5.1   Thyroid Function Tests: No results for input(s): TSH, T4TOTAL, FREET4, T3FREE, THYROIDAB in the last 72 hours. Anemia Panel: No results for input(s): VITAMINB12, FOLATE, FERRITIN, TIBC, IRON, RETICCTPCT in the last 72 hours.  Sepsis Labs:  Recent Labs Lab 10/13/16 1954 10/15/16 0544  WBC 4.2 5.7    Recent Results (from the past 240 hour(s))  Culture, blood  (routine x 2) Call MD if unable to obtain prior to antibiotics being given     Status: None (Preliminary result)   Collection Time: 10/14/16  5:59 AM  Result Value Ref Range Status   Specimen Description BLOOD RIGHT ARM  Final   Special Requests BOTTLES DRAWN AEROBIC AND ANAEROBIC 5CC  Final   Culture   Final    NO GROWTH 1 DAY Performed at [facility], [address], [place], [place] [address]    Report Status PENDING  Incomplete  Culture, blood (routine x 2) Call MD if unable to obtain prior to antibiotics being given     Status: None (Preliminary result)   Collection Time: 10/14/16  5:59 AM  Result Value Ref Range Status   Specimen Description BLOOD RIGHT HAND  Final   Special Requests BOTTLES DRAWN AEROBIC ONLY 5CC  Final   Culture   Final    NO GROWTH 1 DAY Performed at [facility], [address], [place], [place] [address]    Report Status PENDING  Incomplete  Culture, sputum-assessment  Status: None   Collection Time: 10/14/16 11:55 PM  Result Value Ref Range Status   Specimen Description SPU  Final   Special Requests NONE  Final   Sputum evaluation THIS SPECIMEN IS ACCEPTABLE FOR SPUTUM CULTURE  Final   Report Status 10/14/2016 FINAL  Final         Radiology Studies: Dg Chest Port 1 View  Result Date: 10/13/2016 CLINICAL DATA:  Flu like symptoms.  Cough. EXAM: PORTABLE CHEST 1 VIEW COMPARISON:  03/16/2013 FINDINGS: The heart size and mediastinal contours are within normal limits. Both lungs are clear. The visualized skeletal structures are unremarkable. Relatively stable thin-walled cavitation in the right mid lung possibly related to remote prior infection. IMPRESSION: No active disease. Again noted in the right mid lung is a thin-walled cavitary lesion possibly related to old remote infection. Electronically Signed   By: Tollie Eth M.D.   On: 10/13/2016 20:27        Scheduled Meds: . azithromycin  250 mg Oral Q24H  .  dextromethorphan-guaiFENesin  1 tablet Oral BID  . enoxaparin (LOVENOX) injection  40 mg Subcutaneous Q24H  . folic acid  1 mg Oral Daily  . insulin aspart  0-9 Units Subcutaneous TID WC  . ipratropium-albuterol  3 mL Nebulization TID  . methylPREDNISolone (SOLU-MEDROL) injection  60 mg Intravenous Q12H  . multivitamin with minerals  1 tablet Oral Daily  . nicotine  21 mg Transdermal Daily  . oseltamivir  75 mg Oral BID  . thiamine  100 mg Oral Daily   Or  . thiamine  100 mg Intravenous Daily   Continuous Infusions:    LOS: 2 days       [facility], MD [facility] Pager [phone]  If 7PM-7AM, please contact night-coverage [email or web address] Password [facility] 10/15/2016, 11:57 AM

## 2016-10-15 NOTE — Progress Notes (Signed)
Pt instructed on use of Flutter Valve.  Pt demonstrated with good effort and technique.  RT to monitor and assess as needed.  

## 2016-10-15 NOTE — Evaluation (Signed)
Occupational Therapy Evaluation Patient Details Name: Albert Hess MRN: [id number] DOB: [birth year] Today's Date: 10/15/2016    History of Present Illness 71 y.o. male with medical history significant of COPD, hypertension, hyperlipidemia, diabetes mellitus, PTSD, schizophrenia, bipolar disorder, thrombocytopenia, tobacco abuse, marijuana abuse, medication noncompliance and admitted for Acute respiratory failure with hypoxia due to COPD exacerbation; found to be positive for Flu A   Clinical Impression   Pt admitted with the flu. Pt currently with functional limitations due to the deficits listed below (see OT Problem List). Pt will benefit from skilled OT to increase their safety and independence with ADL and functional mobility for ADL to facilitate discharge to venue listed below.      Follow Up Recommendations  No OT follow up    Equipment Recommendations  None recommended by OT    Recommendations for Other Services       Precautions / Restrictions Precautions Precautions: Fall Precaution Comments: monitor sats      Mobility Bed Mobility Overal bed mobility: Needs Assistance Bed Mobility: Supine to Sit;Sit to Supine     Supine to sit: Min assist;HOB elevated Sit to supine: Min assist      Transfers Overall transfer level: Needs assistance Equipment used: 1 person hand held assist Transfers: Sit to/from [employer] Sit to Stand: Min assist Stand pivot transfers: Min assist                 ADL Overall ADL's : Needs assistance/impaired                 Upper Body Dressing : Set up;Sitting   Lower Body Dressing: Moderate assistance;Cueing for safety;Cueing for sequencing;Sit to/from stand   Toilet Transfer: Minimal assistance;Ambulation;Cueing for sequencing;Cueing for safety   Toileting- Clothing Manipulation and Hygiene: Min guard;Sit to/from stand;Cueing for safety;Cueing for sequencing       Functional mobility during ADLs:  Minimal assistance;Cueing for safety;Cueing for sequencing General ADL Comments: pt would benefit from AE for LB dressing as well as education in energy conservation.  Pt coughing significantly during OT eval.  Will educate next OT visit               Pertinent Vitals/Pain Pain Assessment: No/denies pain     Hand Dominance     Extremity/Trunk Assessment Upper Extremity Assessment Upper Extremity Assessment: Generalized weakness           Communication Communication Communication: No difficulties   Cognition Arousal/Alertness: Awake/alert Behavior During Therapy: WFL for tasks assessed/performed Overall Cognitive Status: Within Functional Limits for tasks assessed                                Home Living Family/patient expects to be discharged to:: Private residence Living Arrangements: Alone     Home Access: Ramped entrance     Home Layout: One level               Home Equipment: None          Prior Functioning/Environment Level of Independence: Independent        Comments: pt states he has lived in ALF however currently not but considering moving back into ALF again        OT Problem List: Decreased strength;Decreased activity tolerance;Decreased safety awareness;Decreased knowledge of use of DME or AE   OT Treatment/Interventions: Self-care/ADL training;DME and/or AE instruction;Patient/family education;Energy conservation    OT Goals(Current goals can be found  in the care plan section) Acute Rehab OT Goals Patient Stated Goal: be Independent OT Goal Formulation: With patient Time For Goal Achievement: 10/29/16 Potential to Achieve Goals: Good  OT Frequency: Min 2X/week              End of Session Nurse Communication: Mobility status  Activity Tolerance: Patient tolerated treatment well;Patient limited by fatigue Patient left: in bed;with call bell/phone within reach;with bed alarm set   Time: 1445-1500 OT Time  Calculation (min): 15 min Charges:  OT General Charges $OT Visit: 1 Procedure OT Evaluation $OT Eval Moderate Complexity: 1 Procedure G-Codes:    Alba CoryEDDING, Edom Schmuhl D 10/15/2016, 3:49 PM

## 2016-10-16 ENCOUNTER — Inpatient Hospital Stay (HOSPITAL_COMMUNITY): Payer: Medicare Other

## 2016-10-16 DIAGNOSIS — E875 Hyperkalemia: Secondary | ICD-10-CM

## 2016-10-16 DIAGNOSIS — Z720 Tobacco use: Secondary | ICD-10-CM

## 2016-10-16 LAB — BASIC METABOLIC PANEL
ANION GAP: 5 (ref 5–15)
Anion gap: 6 (ref 5–15)
BUN: 18 mg/dL (ref 6–20)
BUN: 19 mg/dL (ref 6–20)
CHLORIDE: 96 mmol/L — AB (ref 101–111)
CHLORIDE: 95 mmol/L — AB (ref 101–111)
CO2: 31 mmol/L (ref 22–32)
CO2: 32 mmol/L (ref 22–32)
CREATININE: 0.59 mg/dL — AB (ref 0.61–1.24)
CREATININE: 0.79 mg/dL (ref 0.61–1.24)
Calcium: 8.2 mg/dL — ABNORMAL LOW (ref 8.9–10.3)
Calcium: 8.2 mg/dL — ABNORMAL LOW (ref 8.9–10.3)
GFR calc non Af Amer: >60 mL/min (ref >60)
Glucose, Bld: 246 mg/dL — ABNORMAL HIGH (ref 65–99)
Glucose, Bld: 308 mg/dL — ABNORMAL HIGH (ref 65–99)
POTASSIUM: 5.3 mmol/L — AB (ref 3.5–5.1)
Potassium: 4.6 mmol/L (ref 3.5–5.1)
SODIUM: 133 mmol/L — AB (ref 135–145)
SODIUM: 132 mmol/L — AB (ref 135–145)

## 2016-10-16 LAB — GLUCOSE, CAPILLARY
GLUCOSE-CAPILLARY: 246 mg/dL — AB (ref 65–99)
GLUCOSE-CAPILLARY: 312 mg/dL — AB (ref 65–99)
GLUCOSE-CAPILLARY: 225 mg/dL — AB (ref 65–99)
Glucose-Capillary: 234 mg/dL — ABNORMAL HIGH (ref 65–99)

## 2016-10-16 MED ORDER — SODIUM POLYSTYRENE SULFONATE 15 GM/60ML PO SUSP
30.0000 g | Freq: Once | ORAL | Status: AC
  Administered 2016-10-16: 30 g via ORAL
  Filled 2016-10-16: qty 120

## 2016-10-16 MED ORDER — INSULIN GLARGINE 100 UNIT/ML ~~LOC~~ SOLN
10.0000 [IU] | Freq: Every day | SUBCUTANEOUS | Status: DC
  Administered 2016-10-16 – 2016-10-18 (×3): 10 [IU] via SUBCUTANEOUS
  Filled 2016-10-16 (×3): qty 0.1

## 2016-10-16 MED ORDER — METHYLPREDNISOLONE SODIUM SUCC 40 MG IJ SOLR
40.0000 mg | Freq: Two times a day (BID) | INTRAMUSCULAR | Status: DC
  Administered 2016-10-16: 40 mg via INTRAVENOUS
  Filled 2016-10-16: qty 1

## 2016-10-16 NOTE — Progress Notes (Signed)
PROGRESS NOTE  Albert Hess  ZOX:[id number] DOB: 09-27-1945  DOA: 10/13/2016 PCP: Delphia Grates   Brief Narrative:  71 y.o. male with medical history significant of COPD, hypertension, hyperlipidemia, diabetes mellitus, PTSD, schizophrenia, bipolar disorder, thrombocytopenia, tobacco abuse, marijuana abuse, ? Alcohol abuse, medication noncompliance. who presented with shortness of breath and productive cough. Admitted for acute respiratory failure due to COPD exacerbation precipitated by influenza acute bronchitis. Patient was hypoxic 70-87 percent on room air in the ED. Slowly improving.  Assessment & Plan:   Principal Problem:   Acute respiratory failure with hypoxia (HCC) Active Problems:   Bipolar I disorder (HCC)   Thrombocytopenia (HCC)   COPD exacerbation (HCC)   Essential hypertension   Diabetes mellitus without complication (HCC)   HLD (hyperlipidemia)   Tobacco abuse   Influenza A   1. Influenza A acute bronchitis with COPD exacerbation: Influenza A PCR positive. Chest x-ray shows no acute disease but has chronic findings as outlined below. No clinical or radiological CHF. Treating empirically with Tamiflu, azithromycin, Solu-Medrol, oxygen, rhonchi or dilator nebulizations. Urine pneumococcal antigen negative. Blood cultures 2: Negative to date. Sputum culture pending. Added flutter valve. Improving. Start tapering steroids. Patient had bouts of hacking cough on 1/27> improved after scheduled Tessalon and PRN Tussionex. Repeat CXR 1/28 without acute findings. 2. Acute respiratory failure with hypoxia: Secondary to problem #1. Did not use home oxygen prior to admission. Wean oxygen as tolerated for saturations >88%. Now saturating 98% on 2.5 liters.  3. Essential hypertension: Mildly uncontrolled. Apparently noncompliant with Lasix and lisinopril at home. When necessary IV hydralazine. Started Lisinopril 1/27 bute hyperkalemia on 1/28 and Lisinopril DC"ed. May consider othe  ragents, ? Amlodipine. 4. Hyperlipidemia: Noncompliant with statins. 5. Bipolar 1 disorder: Apparently not taking medications at home. Seems stable without symptoms currently. 6. Type II DM: Last A1c: 5.7. Not taking metformin at home. Monitor CBGs and place on SSI as needed while on IV Solu-Medrol. Uncontrolled with CBG's in 200's. Add Lantus 10 units daily. 7. Thrombocytopenia, chronic mild and intermittent: Seems stable. Follow CBCs periodically. 8. Polysubstance abuse (current THC, former smoker,? Alcohol): UDS negative. CIWA protocol. States that he drinks half a pint of vodka every 4 days but has not consumed in 2 weeks due to acute illness. 9. Abnormal chest x-ray: Right mid lung thin-walled cavitary lesion-chronic finding. Outpatient follow-up and evaluation as deemed necessary. 10. Hyperkalemia, mild: ? Etiology. DC Lisinopril. Kayexalate 30 g by mouth 1 dose. Follow BMP.   DVT prophylaxis: SCD's. Add Lovenox Code Status: Full Family Communication: None at bedside Disposition Plan: DC home when medically stable, possibly in 2-3 days.   Consultants:   None  Procedures:   None  Antimicrobials:   Azithromycin    Subjective: Patient had bouts of hacking cough yesterday afternoon which have since. States that dyspnea is approaching baseline. No chest pain. No acute issues per RN.  Objective:  Vitals:   10/15/16 1915 10/15/16 2348 10/16/16 0655 10/16/16 0730  BP:  (!) 157/98 140/85   Pulse:  66 62   Resp:   16   Temp:  98.1 F (36.7 C) 97.9 F (36.6 C)   TempSrc:  Oral Oral   SpO2: 91% 94% 97% 98%  Weight:      Height:        Intake/Output Summary (Last 24 hours) at 10/16/16 1059 Last data filed at 10/16/16 0905  Gross per 24 hour  Intake  960 ml  Output              275 ml  Net              685 ml   Filed Weights   10/13/16 1935 10/14/16 0427  Weight: 120.2 kg (265 lb) 116.7 kg (257 lb 4.4 oz)    Examination:  General exam: Pleasant  middle-aged male sitting up comfortably in bed eating breakfast this morning. Respiratory system: Fair air entry bilaterally. Occasional expiratory rhonchi. Respiratory effort normal. Cardiovascular system: S1 & S2 heard, RRR. No JVD, murmurs, rubs, gallops or clicks. No pedal edema. Telemetry: SB 50's-SR Gastrointestinal system: Abdomen is nondistended, soft and nontender. No organomegaly or masses felt. Normal bowel sounds heard. Central nervous system: Alert and oriented. No focal neurological deficits. Extremities: Symmetric 5 x 5 power. Skin: No rashes, lesions or ulcers Psychiatry: Judgement and insight appear normal. Mood & affect appropriate.     Data Reviewed: I have personally reviewed following labs and imaging studies  CBC:  Recent Labs Lab 10/13/16 1954 10/15/16 0544  WBC 4.2 5.7  NEUTROABS 3.3  --   HGB 15.6 13.5  HCT 43.6 40.5  MCV 93.6 96.2  PLT 132* 144*   Basic Metabolic Panel:  Recent Labs Lab 10/13/16 1954 10/15/16 0544 10/16/16 0558  NA 133* 135 133*  K 4.4 5.0 5.3*  CL 95* 100* 96*  CO2 29 29 31   GLUCOSE 178* 228* 246*  BUN 17 18 18   CREATININE 0.82 0.83 0.59*  CALCIUM 8.5* 8.2* 8.2*   GFR: Estimated Creatinine Clearance: 116.6 mL/min (by C-G formula based on SCr of 0.59 mg/dL (L)). Liver Function Tests:  Recent Labs Lab 10/13/16 1954 10/15/16 0544  AST 50* 34  ALT 33 35  ALKPHOS 59 51  BILITOT 2.1* 1.0  PROT 6.8 5.7*  ALBUMIN 3.4* 3.1*   No results for input(s): LIPASE, AMYLASE in the last 168 hours. No results for input(s): AMMONIA in the last 168 hours. Coagulation Profile: No results for input(s): INR, PROTIME in the last 168 hours. Cardiac Enzymes: No results for input(s): CKTOTAL, CKMB, CKMBINDEX, TROPONINI in the last 168 hours. BNP (last 3 results) No results for input(s): PROBNP in the last 8760 hours. HbA1C: No results for input(s): HGBA1C in the last 72 hours. CBG:  Recent Labs Lab 10/15/16 0735 10/15/16 1227  10/15/16 1710 10/15/16 2347 10/16/16 0805  GLUCAP 193* 214* 251* 249* 234*   Lipid Profile:  Recent Labs  10/14/16 0559  CHOL 113  HDL 22*  LDLCALC 75  TRIG 79  CHOLHDL 5.1   Thyroid Function Tests: No results for input(s): TSH, T4TOTAL, FREET4, T3FREE, THYROIDAB in the last 72 hours. Anemia Panel: No results for input(s): VITAMINB12, FOLATE, FERRITIN, TIBC, IRON, RETICCTPCT in the last 72 hours.  Sepsis Labs:  Recent Labs Lab 10/13/16 1954 10/15/16 0544  WBC 4.2 5.7    Recent Results (from the past 240 hour(s))  Culture, blood (routine x 2) Call MD if unable to obtain prior to antibiotics being given     Status: None (Preliminary result)   Collection Time: 10/14/16  5:59 AM  Result Value Ref Range Status   Specimen Description BLOOD RIGHT ARM  Final   Special Requests BOTTLES DRAWN AEROBIC AND ANAEROBIC 5CC  Final   Culture   Final    NO GROWTH 1 DAY Performed at [facility], [address] [place], [place] [address]    Report Status PENDING  Incomplete  Culture, blood (  routine x 2) Call MD if unable to obtain prior to antibiotics being given     Status: None (Preliminary result)   Collection Time: 10/14/16  5:59 AM  Result Value Ref Range Status   Specimen Description BLOOD RIGHT HAND  Final   Special Requests BOTTLES DRAWN AEROBIC ONLY 5CC  Final   Culture   Final    NO GROWTH 1 DAY Performed at [facility], [address] [place], [place] [address]    Report Status PENDING  Incomplete  Culture, sputum-assessment     Status: None   Collection Time: 10/14/16 11:55 PM  Result Value Ref Range Status   Specimen Description SPU  Final   Special Requests NONE  Final   Sputum evaluation THIS SPECIMEN IS ACCEPTABLE FOR SPUTUM CULTURE  Final   Report Status 10/14/2016 FINAL  Final  Culture, respiratory (NON-Expectorated)     Status: None (Preliminary result)   Collection Time: 10/14/16 11:55 PM  Result Value Ref Range Status   Specimen  Description SPU  Final   Special Requests NONE Reflexed from [id number]  Final   Gram Stain   Final    FEW WBC PRESENT, PREDOMINANTLY PMN MODERATE GRAM POSITIVE COCCI IN CLUSTERS MODERATE GRAM POSITIVE COCCI IN PAIRS MODERATE GRAM POSITIVE RODS    Culture   Final    CULTURE REINCUBATED FOR BETTER GROWTH Performed at [facility], [address] [place], [place] [address]    Report Status PENDING  Incomplete         Radiology Studies: Dg Chest 2 View  Result Date: 10/16/2016 CLINICAL DATA:  Congestion and coughing. EXAM: CHEST  2 VIEW COMPARISON:  10/13/2016 FINDINGS: Cardiomediastinal silhouette is normal. Mediastinal contours appear intact. There is no evidence of focal airspace consolidation, pleural effusion or pneumothorax. Again seen is a 5 cm thin walled cavity within the posterior segment of right lower lobe with small amount of fluid layering dependently. Osseous structures are without acute abnormality. Soft tissues are grossly normal. IMPRESSION: No evidence of focal airspace consolidation. 5 cm thin-walled cavity likely within the superior segment of the right lower lobe with small amount of fluid layering dependently. This finding was slightly smaller in 2014. Most likely etiology is post infectious, however evaluation with nonemergent chest CT may be considered for better characterisation. No evidence of lobar consolidation. Electronically Signed   By: Ted Mcalpineobrinka  Dimitrova M.D.   On: 10/16/2016 10:02        Scheduled Meds: . azithromycin  250 mg Oral Q24H  . benzonatate  200 mg Oral TID  . enoxaparin (LOVENOX) injection  40 mg Subcutaneous Q24H  . fluticasone  2 spray Each Nare Daily  . folic acid  1 mg Oral Daily  . insulin aspart  0-5 Units Subcutaneous QHS  . insulin aspart  0-9 Units Subcutaneous TID WC  . ipratropium-albuterol  3 mL Nebulization TID  . methylPREDNISolone (SOLU-MEDROL) injection  60 mg Intravenous Q12H  . multivitamin with minerals  1 tablet  Oral Daily  . nicotine  21 mg Transdermal Daily  . oseltamivir  75 mg Oral BID  . pravastatin  40 mg Oral Daily  . thiamine  100 mg Oral Daily   Or  . thiamine  100 mg Intravenous Daily   Continuous Infusions:    LOS: 3 days       [facility] HospitalNGALGI,Quintella Mura, MD [facility] Pager [phone]  If 7PM-7AM, please contact night-coverage [email or web address] Password [email or web address] 10/16/2016, 10:59 AM

## 2016-10-17 LAB — BASIC METABOLIC PANEL
Anion gap: 6 (ref 5–15)
BUN: 18 mg/dL (ref 6–20)
CO2: 31 mmol/L (ref 22–32)
Calcium: 8.3 mg/dL — ABNORMAL LOW (ref 8.9–10.3)
Chloride: 97 mmol/L — ABNORMAL LOW (ref 101–111)
Creatinine, Ser: 0.81 mg/dL (ref 0.61–1.24)
GFR calc Af Amer: >60 mL/min (ref >60)
GFR calc non Af Amer: >60 mL/min (ref >60)
GLUCOSE: 251 mg/dL — AB (ref 65–99)
Potassium: 4.5 mmol/L (ref 3.5–5.1)
Sodium: 134 mmol/L — ABNORMAL LOW (ref 135–145)

## 2016-10-17 LAB — CULTURE, RESPIRATORY

## 2016-10-17 LAB — CULTURE, RESPIRATORY W GRAM STAIN: Culture: NORMAL

## 2016-10-17 LAB — GLUCOSE, CAPILLARY
GLUCOSE-CAPILLARY: 211 mg/dL — AB (ref 65–99)
Glucose-Capillary: 197 mg/dL — ABNORMAL HIGH (ref 65–99)
Glucose-Capillary: 283 mg/dL — ABNORMAL HIGH (ref 65–99)
Glucose-Capillary: 276 mg/dL — ABNORMAL HIGH (ref 65–99)

## 2016-10-17 MED ORDER — PREDNISONE 20 MG PO TABS
40.0000 mg | ORAL_TABLET | Freq: Every day | ORAL | Status: DC
  Administered 2016-10-17 – 2016-10-18 (×2): 40 mg via ORAL
  Filled 2016-10-17 (×2): qty 2

## 2016-10-17 MED ORDER — INSULIN ASPART 100 UNIT/ML ~~LOC~~ SOLN
3.0000 [IU] | Freq: Three times a day (TID) | SUBCUTANEOUS | Status: DC
  Administered 2016-10-17 – 2016-10-18 (×4): 3 [IU] via SUBCUTANEOUS

## 2016-10-17 MED ORDER — AMLODIPINE BESYLATE 5 MG PO TABS
5.0000 mg | ORAL_TABLET | Freq: Every day | ORAL | Status: DC
  Administered 2016-10-17 – 2016-10-18 (×2): 5 mg via ORAL
  Filled 2016-10-17 (×2): qty 1

## 2016-10-17 NOTE — Progress Notes (Signed)
OT Cancellation Note  Patient Details Name: Albert GoldsmithMark M Wurth MRN: [id number] DOB: 1946/07/12   Cancelled Treatment:    Reason Eval/Treat Not Completed: Fatigue/lethargy limiting ability to participate  Alba CoryREDDING, Hugo Lybrand D 10/17/2016, 4:58 PM

## 2016-10-17 NOTE — Care Management Note (Signed)
Case Management Note  Patient Details  Name: Albert Hess MRN: [id number] Date of Birth: 1945-11-18  Subjective/Objective:   PT recc HHPT-provided patient w/HHC agency list as resource-await choice-he will discuss it w/his son tonight. Noted on 02-if needed @ home will need documented 02 sats,& home 02 order w/liter/freq/duration,& route.                 Action/Plan:d/c home w/HHC.   Expected Discharge Date:  10/16/16               Expected Discharge Plan:  Home w Home Health Services  In-House Referral:     Discharge planning Services  CM Consult  Post Acute Care Choice:    Choice offered to:     DME Arranged:    DME Agency:     HH Arranged:    HH Agency:     Status of Service:  In process, will continue to follow  If discussed at Long Length of Stay Meetings, dates discussed:    Additional Comments:  Lanier ClamMahabir, Sady Monaco, RN 10/17/2016, 4:18 PM

## 2016-10-17 NOTE — Care Management Important Message (Signed)
Important Message  Patient Details  Name: Albert GoldsmithMark M Hess MRN: [id number] Date of Birth: Mar 14, 1946   Medicare Important Message Given:  Yes    [name], [name] 10/17/2016, 4:33 PMImportant Message  Patient Details  Name: Albert GoldsmithMark M Hess MRN: [id number] Date of Birth: Mar 14, 1946   Medicare Important Message Given:  Yes    [name], [name] 10/17/2016, 4:33 PM

## 2016-10-17 NOTE — Progress Notes (Signed)
Physical Therapy Treatment Patient Details Name: Albert GoldsmithMark M Hess MRN: [id number] DOB: 1945/12/10 Today's Date: 10/17/2016    History of Present Illness 71 y.o. male with medical history significant of COPD, hypertension, hyperlipidemia, diabetes mellitus, PTSD, schizophrenia, bipolar disorder, thrombocytopenia, tobacco abuse, marijuana abuse, medication noncompliance and admitted for Acute respiratory failure with hypoxia due to COPD exacerbation; found to be positive for Flu A    PT Comments    Progressing slowly with mobility. Pt fatigues easily with ambulation. O2 sats 91% on 2L O2, dyspnea 3/4 during ambulation. Feel pt could benefit from HHPT follow up after discharge if he is agreeable. Recommend daily ambulation in hallway as tolerated with nursing supervision. Will continue to follow.    Follow Up Recommendations  Home health PT (if pt is agreeable); Intermittent supervision     Equipment Recommendations   (continuing to assess)    Recommendations for Other Services       Precautions / Restrictions Precautions Precautions: Fall Precaution Comments: monitor sats; droplet+flu Restrictions Weight Bearing Restrictions: No    Mobility  Bed Mobility Overal bed mobility: Needs Assistance Bed Mobility: Sit to Supine       Sit to supine: Min guard   General bed mobility comments: Increased time and effortful for pt. No assist provided however.   Transfers Overall transfer level: Needs assistance Equipment used: None Transfers: Sit to/from Stand Sit to Stand: Min guard         General transfer comment: close guard for safety.   Ambulation/Gait Ambulation/Gait assistance: Min guard Ambulation Distance (Feet): 50 Feet Assistive device: None (dynamap) Gait Pattern/deviations: Decreased step length - right;Decreased step length - left;Decreased stride length     General Gait Details: slow gait speed. short, choppy steps (possibly due to dynamap proximity). O2 sats 91%  on 2L, dyspnea 2/4. pt fatigues easily.    Stairs            Wheelchair Mobility    Modified Rankin (Stroke Patients Only)       Balance                                    Cognition Arousal/Alertness: Awake/alert Behavior During Therapy: WFL for tasks assessed/performed Overall Cognitive Status: Within Functional Limits for tasks assessed                      Exercises      General Comments        Pertinent Vitals/Pain Pain Assessment: No/denies pain    Home Living                      Prior Function            PT Goals (current goals can now be found in the care plan section) Progress towards PT goals: Progressing toward goals    Frequency    Min 3X/week      PT Plan Current plan remains appropriate    Co-evaluation             End of Session Equipment Utilized During Treatment: Oxygen Activity Tolerance: Patient limited by fatigue Patient left: in bed;with call bell/phone within reach     Time: [id number] PT Time Calculation (min) (ACUTE ONLY): 12 min  Charges:  $Gait Training: 8-22 mins                    G  Codes:      Weston Anna, MPT Pager: [phone]

## 2016-10-17 NOTE — Progress Notes (Signed)
PROGRESS NOTE  Albert Hess  HEN:[id number] DOB: [birth year]  DOA: 10/13/2016 PCP: Delphia Grates   Brief Narrative:  71 y.o. male with medical history significant of COPD, hypertension, hyperlipidemia, diabetes mellitus, PTSD, schizophrenia, bipolar disorder, thrombocytopenia, tobacco abuse, marijuana abuse, ? Alcohol abuse, medication noncompliance. who presented with shortness of breath and productive cough. Admitted for acute respiratory failure due to COPD exacerbation precipitated by influenza acute bronchitis. Patient was hypoxic 70-87 percent on room air in the ED. Slowly improving.  Assessment & Plan:   Principal Problem:   Acute respiratory failure with hypoxia (HCC) Active Problems:   Bipolar I disorder (HCC)   Thrombocytopenia (HCC)   COPD exacerbation (HCC)   Essential hypertension   Diabetes mellitus without complication (HCC)   HLD (hyperlipidemia)   Tobacco abuse   Influenza A   1. Influenza A acute bronchitis with COPD exacerbation: Influenza A PCR positive. Chest x-ray shows no acute disease but has chronic findings as outlined below. No clinical or radiological CHF. Treating empirically with Tamiflu, azithromycin, Solu-Medrol, oxygen, rhonchi or dilator nebulizations. Urine pneumococcal antigen negative. Blood cultures 2: Negative to date. Sputum culture pending. Added flutter valve. Improving. Repeat CXR 1/28 without acute findings. Overall improved. Transitioned to oral prednisone taper on 1/29. Monitor overnight. Reassess in a.m. for home oxygen requirement as needed. 2. Acute respiratory failure with hypoxia: Secondary to problem #1. Did not use home oxygen prior to admission. Wean oxygen as tolerated for saturations >88%. Now saturating 99% on 3 liters.  3. Essential hypertension: Mildly uncontrolled. Apparently noncompliant with Lasix and lisinopril at home. When necessary IV hydralazine. Started Lisinopril 1/27 bute hyperkalemia on 1/28 and Lisinopril DC"ed. May  consider other agents, ? Amlodipine. 4. Hyperlipidemia: Noncompliant with statins. 5. Bipolar 1 disorder: Apparently not taking medications at home. Seems stable without symptoms currently. 6. Type II DM: Last A1c: 5.7. Not taking metformin at home. Monitor CBGs and place on SSI as needed while on IV Solu-Medrol. Uncontrolled with CBG's in 200's. Added Lantus 10 units daily. Tapering down steroids which should help. 7. Thrombocytopenia, chronic mild and intermittent: Seems stable. Follow CBCs periodically. 8. Polysubstance abuse (current THC, former smoker,? Alcohol): UDS negative. CIWA protocol. States that he drinks half a pint of vodka every 4 days but has not consumed in 2 weeks due to acute illness. 9. Abnormal chest x-ray: Right mid lung thin-walled cavitary lesion-chronic finding. Outpatient follow-up and evaluation as deemed necessary. 10. Hyperkalemia, mild: ? Etiology. DC Lisinopril. Kayexalate 30 g by mouth 1 dose. Resolved. Follow BMP periodically as outpatient.   DVT prophylaxis: SCD's. Add Lovenox Code Status: Full Family Communication: None at bedside Disposition Plan: DC home when medically stable, possibly 1/30.   Consultants:   None  Procedures:   None  Antimicrobials:   Azithromycin    Subjective: Cough improved. Dyspnea continues to improve and may be close to baseline. Denies any other complaints.  Objective:  Vitals:   10/16/16 2022 10/16/16 2125 10/17/16 0603 10/17/16 0852  BP:  (!) 124/94 (!) 154/86   Pulse:  94 62   Resp:  (!) 24 18   Temp:  98.3 F (36.8 C) 97.7 F (36.5 C)   TempSrc:  Oral Oral   SpO2: 94% 94% 93% 99%  Weight:      Height:        Intake/Output Summary (Last 24 hours) at 10/17/16 1212 Last data filed at 10/16/16 1721  Gross per 24 hour  Intake  480 ml  Output                0 ml  Net              480 ml   Filed Weights   10/13/16 1935 10/14/16 0427  Weight: 120.2 kg (265 lb) 116.7 kg (257 lb 4.4 oz)     Examination:  General exam: Pleasant middle-aged male sitting up comfortably in bed. Respiratory system: Good breath sounds and essentially clear to auscultation except few rhonchi posteriorly. Respiratory effort normal. Cardiovascular system: S1 & S2 heard, RRR. No JVD, murmurs, rubs, gallops or clicks. No pedal edema. Telemetry: SB 50's-SR Gastrointestinal system: Abdomen is nondistended, soft and nontender. No organomegaly or masses felt. Normal bowel sounds heard. Central nervous system: Alert and oriented. No focal neurological deficits. Extremities: Symmetric 5 x 5 power. Skin: No rashes, lesions or ulcers Psychiatry: Judgement and insight appear normal. Mood & affect appropriate.     Data Reviewed: I have personally reviewed following labs and imaging studies  CBC:  Recent Labs Lab 10/13/16 1954 10/15/16 0544  WBC 4.2 5.7  NEUTROABS 3.3  --   HGB 15.6 13.5  HCT 43.6 40.5  MCV 93.6 96.2  PLT 132* 144*   Basic Metabolic Panel:  Recent Labs Lab 10/13/16 1954 10/15/16 0544 10/16/16 0558 10/16/16 1630 10/17/16 0502  NA 133* 135 133* 132* 134*  K 4.4 5.0 5.3* 4.6 4.5  CL 95* 100* 96* 95* 97*  CO2 29 29 31  32 31  GLUCOSE 178* 228* 246* 308* 251*  BUN 17 18 18 19 18   CREATININE 0.82 0.83 0.59* 0.79 0.81  CALCIUM 8.5* 8.2* 8.2* 8.2* 8.3*   GFR: Estimated Creatinine Clearance: 115.2 mL/min (by C-G formula based on SCr of 0.81 mg/dL). Liver Function Tests:  Recent Labs Lab 10/13/16 1954 10/15/16 0544  AST 50* 34  ALT 33 35  ALKPHOS 59 51  BILITOT 2.1* 1.0  PROT 6.8 5.7*  ALBUMIN 3.4* 3.1*   No results for input(s): LIPASE, AMYLASE in the last 168 hours. No results for input(s): AMMONIA in the last 168 hours. Coagulation Profile: No results for input(s): INR, PROTIME in the last 168 hours. Cardiac Enzymes: No results for input(s): CKTOTAL, CKMB, CKMBINDEX, TROPONINI in the last 168 hours. BNP (last 3 results) No results for input(s): PROBNP in the  last 8760 hours. HbA1C: No results for input(s): HGBA1C in the last 72 hours. CBG:  Recent Labs Lab 10/16/16 0805 10/16/16 1223 10/16/16 1703 10/16/16 2121 10/17/16 0816  GLUCAP 234* 246* 312* 225* 197*   Lipid Profile: No results for input(s): CHOL, HDL, LDLCALC, TRIG, CHOLHDL, LDLDIRECT in the last 72 hours. Thyroid Function Tests: No results for input(s): TSH, T4TOTAL, FREET4, T3FREE, THYROIDAB in the last 72 hours. Anemia Panel: No results for input(s): VITAMINB12, FOLATE, FERRITIN, TIBC, IRON, RETICCTPCT in the last 72 hours.  Sepsis Labs:  Recent Labs Lab 10/13/16 1954 10/15/16 0544  WBC 4.2 5.7    Recent Results (from the past 240 hour(s))  Culture, blood (routine x 2) Call MD if unable to obtain prior to antibiotics being given     Status: None (Preliminary result)   Collection Time: 10/14/16  5:59 AM  Result Value Ref Range Status   Specimen Description BLOOD RIGHT ARM  Final   Special Requests BOTTLES DRAWN AEROBIC AND ANAEROBIC 5CC  Final   Culture   Final    NO GROWTH 2 DAYS Performed at [facility], [address]  [place], [place] [address]    Report Status PENDING  Incomplete  Culture, blood (routine x 2) Call MD if unable to obtain prior to antibiotics being given     Status: None (Preliminary result)   Collection Time: 10/14/16  5:59 AM  Result Value Ref Range Status   Specimen Description BLOOD RIGHT HAND  Final   Special Requests BOTTLES DRAWN AEROBIC ONLY 5CC  Final   Culture   Final    NO GROWTH 2 DAYS Performed at [facility], [address], [place], [place] [address]    Report Status PENDING  Incomplete  Culture, sputum-assessment     Status: None   Collection Time: 10/14/16 11:55 PM  Result Value Ref Range Status   Specimen Description SPU  Final   Special Requests NONE  Final   Sputum evaluation THIS SPECIMEN IS ACCEPTABLE FOR SPUTUM CULTURE  Final   Report Status 10/14/2016 FINAL  Final  Culture, respiratory  (NON-Expectorated)     Status: None (Preliminary result)   Collection Time: 10/14/16 11:55 PM  Result Value Ref Range Status   Specimen Description SPU  Final   Special Requests NONE Reflexed from [id number]  Final   Gram Stain   Final    FEW WBC PRESENT, PREDOMINANTLY PMN MODERATE GRAM POSITIVE COCCI IN CLUSTERS MODERATE GRAM POSITIVE COCCI IN PAIRS MODERATE GRAM POSITIVE RODS    Culture   Final    CULTURE REINCUBATED FOR BETTER GROWTH Performed at [facility], [address] [place], [place] [address]    Report Status PENDING  Incomplete         Radiology Studies: Dg Chest 2 View  Result Date: 10/16/2016 CLINICAL DATA:  Congestion and coughing. EXAM: CHEST  2 VIEW COMPARISON:  10/13/2016 FINDINGS: Cardiomediastinal silhouette is normal. Mediastinal contours appear intact. There is no evidence of focal airspace consolidation, pleural effusion or pneumothorax. Again seen is a 5 cm thin walled cavity within the posterior segment of right lower lobe with small amount of fluid layering dependently. Osseous structures are without acute abnormality. Soft tissues are grossly normal. IMPRESSION: No evidence of focal airspace consolidation. 5 cm thin-walled cavity likely within the superior segment of the right lower lobe with small amount of fluid layering dependently. This finding was slightly smaller in 2014. Most likely etiology is post infectious, however evaluation with nonemergent chest CT may be considered for better characterisation. No evidence of lobar consolidation. Electronically Signed   By: Ted Mcalpine M.D.   On: 10/16/2016 10:02        Scheduled Meds: . azithromycin  250 mg Oral Q24H  . benzonatate  200 mg Oral TID  . enoxaparin (LOVENOX) injection  40 mg Subcutaneous Q24H  . fluticasone  2 spray Each Nare Daily  . folic acid  1 mg Oral Daily  . insulin aspart  0-5 Units Subcutaneous QHS  . insulin aspart  0-9 Units Subcutaneous TID WC  . insulin aspart  3  Units Subcutaneous TID WC  . insulin glargine  10 Units Subcutaneous Daily  . ipratropium-albuterol  3 mL Nebulization TID  . multivitamin with minerals  1 tablet Oral Daily  . nicotine  21 mg Transdermal Daily  . oseltamivir  75 mg Oral BID  . pravastatin  40 mg Oral Daily  . predniSONE  40 mg Oral Q breakfast  . thiamine  100 mg Oral Daily   Or  . thiamine  100 mg Intravenous Daily   Continuous Infusions:    LOS:  4 days       [facility], MD [facility] Pager [phone]  If 7PM-7AM, please contact night-coverage [email or web address] Password [email or web address] 10/17/2016, 12:12 PM

## 2016-10-18 DIAGNOSIS — E119 Type 2 diabetes mellitus without complications: Secondary | ICD-10-CM

## 2016-10-18 LAB — GLUCOSE, CAPILLARY
GLUCOSE-CAPILLARY: 185 mg/dL — AB (ref 65–99)
GLUCOSE-CAPILLARY: 233 mg/dL — AB (ref 65–99)
Glucose-Capillary: 273 mg/dL — ABNORMAL HIGH (ref 65–99)

## 2016-10-18 MED ORDER — FLUTICASONE PROPIONATE 50 MCG/ACT NA SUSP: 2.0000 | Freq: Every day | NASAL | 0 refills | Status: DC

## 2016-10-18 MED ORDER — PREDNISONE 10 MG PO TABS: ORAL_TABLET | ORAL | 0 refills | Status: DC

## 2016-10-18 MED ORDER — METFORMIN HCL 500 MG PO TABS: 500.0000 mg | ORAL_TABLET | Freq: Two times a day (BID) | ORAL | 0 refills | Status: DC

## 2016-10-18 MED ORDER — FOLIC ACID 1 MG PO TABS: 1.0000 mg | ORAL_TABLET | Freq: Every day | ORAL | 0 refills | Status: DC

## 2016-10-18 MED ORDER — AMLODIPINE BESYLATE 5 MG PO TABS: 5.0000 mg | ORAL_TABLET | Freq: Every day | ORAL | 0 refills | Status: DC

## 2016-10-18 MED ORDER — ADULT MULTIVITAMIN W/MINERALS CH: 1.0000 | ORAL_TABLET | Freq: Every day | ORAL | Status: AC

## 2016-10-18 MED ORDER — BENZONATATE 200 MG PO CAPS: 200.0000 mg | ORAL_CAPSULE | Freq: Three times a day (TID) | ORAL | 0 refills | Status: DC | PRN

## 2016-10-18 MED ORDER — PRAVASTATIN SODIUM 40 MG PO TABS: 40.0000 mg | ORAL_TABLET | Freq: Every day | ORAL | 0 refills | Status: DC

## 2016-10-18 MED ORDER — IPRATROPIUM-ALBUTEROL 20-100 MCG/ACT IN AERS: 1.0000 | INHALATION_SPRAY | Freq: Four times a day (QID) | RESPIRATORY_TRACT | 0 refills | Status: AC | PRN

## 2016-10-18 MED ORDER — THIAMINE HCL 100 MG PO TABS: 100.0000 mg | ORAL_TABLET | Freq: Every day | ORAL | 0 refills | Status: AC

## 2016-10-18 MED ORDER — NICOTINE 21 MG/24HR TD PT24: 21.0000 mg | MEDICATED_PATCH | Freq: Every day | TRANSDERMAL | 0 refills | Status: DC

## 2016-10-18 NOTE — Discharge Instructions (Signed)
Influenza, Adult Influenza, more commonly known as the flu, is a viral infection that primarily affects the respiratory tract. The respiratory tract includes organs that help you breathe, such as the lungs, nose, and throat. The flu causes many common cold symptoms, as well as a high fever and body aches. The flu spreads easily from person to person (is contagious). Getting a flu shot (influenza vaccination) every year is the best way to prevent influenza. What are the causes? Influenza is caused by a virus. You can catch the virus by:  Breathing in droplets from an infected person's cough or sneeze.  Touching something that was recently contaminated with the virus and then touching your mouth, nose, or eyes. What increases the risk? The following factors may make you more likely to get the flu:  Not cleaning your hands frequently with soap and water or alcohol-based hand sanitizer.  Having close contact with many people during cold and flu season.  Touching your mouth, eyes, or nose without washing or sanitizing your hands first.  Not drinking enough fluids or not eating a healthy diet.  Not getting enough sleep or exercise.  Being under a high amount of stress.  Not getting a yearly (annual) flu shot. You may be at a higher risk of complications from the flu, such as a severe lung infection (pneumonia), if you:  Are over the age of 49.  Are pregnant.  Have a weakened disease-fighting system (immune system). You may have a weakened immune system if you:  Have HIV or AIDS.  Are undergoing chemotherapy.  Aretaking medicines that reduce the activity of (suppress) the immune system.  Have a long-term (chronic) illness, such as heart disease, kidney disease, diabetes, or lung disease.  Have a liver disorder.  Are obese.  Have anemia. What are the signs or symptoms? Symptoms of this condition typically last 4-10 days and may include:  Fever.  Chills.  Headache, body  aches, or muscle aches.  Sore throat.  Cough.  Runny or congested nose.  Chest discomfort and cough.  Poor appetite.  Weakness or tiredness (fatigue).  Dizziness.  Nausea or vomiting. How is this diagnosed? This condition may be diagnosed based on your medical history and a physical exam. Your health care provider may do a nose or throat swab test to confirm the diagnosis. How is this treated? If influenza is detected early, you can be treated with antiviral medicine that can reduce the length of your illness and the severity of your symptoms. This medicine may be given by mouth (orally) or through an IV tube that is inserted in one of your veins. The goal of treatment is to relieve symptoms by taking care of yourself at home. This may include taking over-the-counter medicines, drinking plenty of fluids, and adding humidity to the air in your home. In some cases, influenza goes away on its own. Severe influenza or complications from influenza may be treated in a hospital. Follow these instructions at home:  Take over-the-counter and prescription medicines only as told by your health care provider.  Use a cool mist humidifier to add humidity to the air in your home. This can make breathing easier.  Rest as needed.  Drink enough fluid to keep your urine clear or pale yellow.  Cover your mouth and nose when you cough or sneeze.  Wash your hands with soap and water often, especially after you cough or sneeze. If soap and water are not available, use hand sanitizer.  Stay home from  work or school as told by your health care provider. Unless you are visiting your health care provider, try to avoid leaving home until your fever has been gone for 24 hours without the use of medicine.  Keep all follow-up visits as told by your health care provider. This is important. How is this prevented?  Getting an annual flu shot is the best way to avoid getting the flu. You may get the flu shot  in late summer, fall, or winter. Ask your health care provider when you should get your flu shot.  Wash your hands often or use hand sanitizer often.  Avoid contact with people who are sick during cold and flu season.  Eat a healthy diet, drink plenty of fluids, get enough sleep, and exercise regularly. Contact a health care provider if:  You develop new symptoms.  You have:  Chest pain.  Diarrhea.  A fever.  Your cough gets worse.  You produce more mucus.  You feel nauseous or you vomit. Get help right away if:  You develop shortness of breath or difficulty breathing.  Your skin or nails turn a bluish color.  You have severe pain or stiffness in your neck.  You develop a sudden headache or sudden pain in your face or ear.  You cannot stop vomiting. This information is not intended to replace advice given to you by your health care provider. Make sure you discuss any questions you have with your health care provider. Document Released: 09/02/2000 Document Revised: 02/11/2016 Document Reviewed: 06/30/2015 Elsevier Interactive Patient Education  2017 [employer]  Chronic Obstructive Pulmonary Disease Chronic obstructive pulmonary disease (COPD) is a common lung condition in which airflow from the lungs is limited. COPD is a general term that can be used to describe many different lung problems that limit airflow, including both chronic bronchitis and emphysema. If you have COPD, your lung function will probably never return to normal, but there are measures you can take to improve lung function and make yourself feel better. What are the causes?  Smoking (common).  Exposure to secondhand smoke.  Genetic problems.  Chronic inflammatory lung diseases or recurrent infections. What are the signs or symptoms?  Shortness of breath, especially with physical activity.  Deep, persistent (chronic) cough with a large amount of thick mucus.  Wheezing.  Rapid breaths  (tachypnea).  Gray or bluish discoloration (cyanosis) of the skin, especially in your fingers, toes, or lips.  Fatigue.  Weight loss.  Frequent infections or episodes when breathing symptoms become much worse (exacerbations).  Chest tightness. How is this diagnosed? Your health care provider will take a medical history and perform a physical examination to diagnose COPD. Additional tests for COPD may include:  Lung (pulmonary) function tests.  Chest X-ray.  CT scan.  Blood tests. How is this treated? Treatment for COPD may include:  Inhaler and nebulizer medicines. These help manage the symptoms of COPD and make your breathing more comfortable.  Supplemental oxygen. Supplemental oxygen is only helpful if you have a low oxygen level in your blood.  Exercise and physical activity. These are beneficial for nearly all people with COPD.  Lung surgery or transplant.  Nutrition therapy to gain weight, if you are underweight.  Pulmonary rehabilitation. This may involve working with a team of health care providers and specialists, such as respiratory, occupational, and physical therapists. Follow these instructions at home:  Take all medicines (inhaled or pills) as directed by your health care provider.  Avoid over-the-counter medicines  or cough syrups that dry up your airway (such as antihistamines) and slow down the elimination of secretions unless instructed otherwise by your health care provider.  If you are a smoker, the most important thing that you can do is stop smoking. Continuing to smoke will cause further lung damage and breathing trouble. Ask your health care provider for help with quitting smoking. He or she can direct you to community resources or hospitals that provide support.  Avoid exposure to irritants such as smoke, chemicals, and fumes that aggravate your breathing.  Use oxygen therapy and pulmonary rehabilitation if directed by your health care provider. If  you require home oxygen therapy, ask your health care provider whether you should purchase a pulse oximeter to measure your oxygen level at home.  Avoid contact with individuals who have a contagious illness.  Avoid extreme temperature and humidity changes.  Eat healthy foods. Eating smaller, more frequent meals and resting before meals may help you maintain your strength.  Stay active, but balance activity with periods of rest. Exercise and physical activity will help you maintain your ability to do things you want to do.  Preventing infection and hospitalization is very important when you have COPD. Make sure to receive all the vaccines your health care provider recommends, especially the pneumococcal and influenza vaccines. Ask your health care provider whether you need a pneumonia vaccine.  Learn and use relaxation techniques to manage stress.  Learn and use controlled breathing techniques as directed by your health care provider. Controlled breathing techniques include: 1. Pursed lip breathing. Start by breathing in (inhaling) through your nose for 1 second. Then, purse your lips as if you were going to whistle and breathe out (exhale) through the pursed lips for 2 seconds. 2. Diaphragmatic breathing. Start by putting one hand on your abdomen just above your waist. Inhale slowly through your nose. The hand on your abdomen should move out. Then purse your lips and exhale slowly. You should be able to feel the hand on your abdomen moving in as you exhale.  Learn and use controlled coughing to clear mucus from your lungs. Controlled coughing is a series of short, progressive coughs. The steps of controlled coughing are: 1. Lean your head slightly forward. 2. Breathe in deeply using diaphragmatic breathing. 3. Try to hold your breath for 3 seconds. 4. Keep your mouth slightly open while coughing twice. 5. Spit any mucus out into a tissue. 6. Rest and repeat the steps once or twice as  needed. Contact a health care provider if:  You are coughing up more mucus than usual.  There is a change in the color or thickness of your mucus.  Your breathing is more labored than usual.  Your breathing is faster than usual. Get help right away if:  You have shortness of breath while you are resting.  You have shortness of breath that prevents you from:  Being able to talk.  Performing your usual physical activities.  You have chest pain lasting longer than 5 minutes.  Your skin color is more cyanotic than usual.  You measure low oxygen saturations for longer than 5 minutes with a pulse oximeter. This information is not intended to replace advice given to you by your health care provider. Make sure you discuss any questions you have with your health care provider. Document Released: 06/15/2005 Document Revised: 02/11/2016 Document Reviewed: 05/02/2013 Elsevier Interactive Patient Education  2017 [employer].  Additional discharge instructions:  Please get your medications reviewed and  adjusted by your Primary MD.  Please request your Primary MD to go over all Hospital Tests and Procedure/Radiological results at the follow up, please get all Hospital records sent to your Prim MD by signing hospital release before you go home.  If you had Pneumonia of Lung problems at the Hospital: Please get a 2 view Chest X ray done in 6-8 weeks after hospital discharge or sooner if instructed by your Primary MD.  If you have Congestive Heart Failure: Please call your Cardiologist or Primary MD anytime you have any of the following symptoms:  1) 3 pound weight gain in 24 hours or 5 pounds in 1 week  2) shortness of breath, with or without a dry hacking cough  3) swelling in the hands, feet or stomach  4) if you have to sleep on extra pillows at night in order to breathe  Follow cardiac low salt diet and 1.5 lit/day fluid restriction.  If you have diabetes Accuchecks 4 times/day,  Once in AM empty stomach and then before each meal. Log in all results and show them to your primary doctor at your next visit. If any glucose reading is under 80 or above 300 call your primary MD immediately.  If you have Seizure/Convulsions/Epilepsy: Please do not drive, operate heavy machinery, participate in activities at heights or participate in high speed sports until you have seen by Primary MD or a Neurologist and advised to do so again.  If you had Gastrointestinal Bleeding: Please ask your Primary MD to check a complete blood count within one week of discharge or at your next visit. Your endoscopic/colonoscopic biopsies that are pending at the time of discharge, will also need to followed by your Primary MD.  Get Medicines reviewed and adjusted. Please take all your medications with you for your next visit with your Primary MD  Please request your Primary MD to go over all hospital tests and procedure/radiological results at the follow up, please ask your Primary MD to get all Hospital records sent to his/her office.  If you experience worsening of your admission symptoms, develop shortness of breath, life threatening emergency, suicidal or homicidal thoughts you must seek medical attention immediately by calling 911 or calling your MD immediately  if symptoms less severe.  You must read complete instructions/literature along with all the possible adverse reactions/side effects for all the Medicines you take and that have been prescribed to you. Take any new Medicines after you have completely understood and accpet all the possible adverse reactions/side effects.   Do not drive or operate heavy machinery when taking Pain medications.   Do not take more than prescribed Pain, Sleep and Anxiety Medications  Special Instructions: If you have smoked or chewed Tobacco  in the last 2 yrs please stop smoking, stop any regular Alcohol  and or any Recreational drug use.  Wear Seat belts  while driving.  Please note You were cared for by a hospitalist during your hospital stay. If you have any questions about your discharge medications or the care you received while you were in the hospital after you are discharged, you can call the unit and asked to speak with the hospitalist on call if the hospitalist that took care of you is not available. Once you are discharged, your primary care physician will handle any further medical issues. Please note that NO REFILLS for any discharge medications will be authorized once you are discharged, as it is imperative that you return to your primary care  physician (or establish a relationship with a primary care physician if you do not have one) for your aftercare needs so that they can reassess your need for medications and monitor your lab values.  You can reach the hospitalist office at phone [phone] or fax [phone]   If you do not have a primary care physician, you can call [phone] for a physician referral.

## 2016-10-18 NOTE — Care Management (Signed)
ED [facility] unable to get Combivent prescription filled because insurance will not cover the cost. [facility] [address] [phone] and provided information for free savings card. CM contacted patient's son  Italyhad Nabi [phone] with update, son asked if we could also call in the Albuterol HFA as well in case the combivent voucher is not honored. CM notified Dr. Waymon AmatoHongalgi regarding this. CM also provided [facility] information as well. Son was updated and given my contact information  Should he  have any further questions or concerns.

## 2016-10-18 NOTE — Progress Notes (Signed)
SATURATION QUALIFICATIONS: (This note is used to comply with regulatory documentation for home oxygen)  Patient Saturations on Room Air at Rest = 93%  Patient Saturations on Room Air while Ambulating = 87%  Patient Saturations on 1 Liters of oxygen while Ambulating = 93%  Please briefly explain why patient needs home oxygen: Pt only able to ambulate 50 feet before becoming visibly weak and requesting to sit down. Pt also c/o SOB. O2 Sats remained 91-95% RA for majority of walk, but quickly dropped to 87% RA while walking back to bed. 1L/[place] reapplied to pt for comfort and O2 Sats increased tp 93% 1L/[place].

## 2016-10-18 NOTE — Discharge Summary (Signed)
Physician Discharge Summary  Albert Hess:[id number] DOB: [birth year]  PCP: Delphia Grates  Admit date: 10/13/2016 Discharge date: 10/18/2016  Recommendations for Outpatient Follow-up:  1. Elvera Lennox, PA-C/PCP in 3 days with repeat labs (CBC, BMP & HbA1C). Please follow final blood culture results that were sent from hospital. 2. Consider CT chest and outpatient pulmonology consultation for evaluation/follow-up of chronic abnormal chest x-ray finding as detailed below.  Home Health: PT Equipment/Devices: Home oxygen via nasal cannula at 1 L per minute, continuously.  Discharge Condition: Improved and stable.  CODE STATUS: FUll  Diet recommendation: Heart Healthy & Diabetic diet.  Discharge Diagnoses:  Principal Problem:   Acute respiratory failure with hypoxia (HCC) Active Problems:   Bipolar I disorder (HCC)   Thrombocytopenia (HCC)   COPD exacerbation (HCC)   Essential hypertension   Diabetes mellitus without complication (HCC)   HLD (hyperlipidemia)   Tobacco abuse   Influenza A   Brief/Interim Summary: 71 y.o.malewith medical history significant of COPD, hypertension, hyperlipidemia, diabetes mellitus, PTSD, schizophrenia, bipolar disorder, thrombocytopenia, tobacco abuse, marijuana abuse, ? Alcohol abuse, medication noncompliance. who presented with shortness of breath and productive cough. Admitted for acute respiratory failure due to COPD exacerbation precipitated by influenza acute bronchitis. Patient was hypoxic 70-87 percent on room air in the ED. Slowly improving.  Assessment & Plan:   1. Influenza A acute bronchitis with COPD exacerbation: Influenza A PCR positive. Chest x-ray shows no acute disease but has chronic findings as outlined below. No clinical or radiological CHF. Treated empirically with Tamiflu, azithromycin, Solu-Medrol, oxygen, bronchodilator nebulizations. Urine pneumococcal antigen negative. Blood cultures 2: Negative to date. Sputum culture  - normal respiratory flora. Added flutter valve. Repeat CXR 1/28 without acute findings. Overall improved. Transitioned to oral prednisone taper on 1/29. Completed 5 day course of Tamiflu and azithromycin. Tobacco cessation counseled. At discharge, prednisone taper, Combivent respimat inhaler when necessary and Tessalon when necessary for cough. Also qualified for home oxygen. 2. Acute respiratory failure with hypoxia: Secondary to problem #1. Did not use home oxygen prior to admission. Reassessed on day of discharge and patient qualified for home oxygen. Arranged by case management. This can be further followed up as outpatient.  3. Essential hypertension: Mildly uncontrolled. Apparently noncompliant with Lasix and lisinopril at home. Started Lisinopril 1/27 but hyperkalemia on 1/28 and Lisinopril DC"ed. Started amlodipine. Better controlled. 4. Hyperlipidemia: Noncompliant with statins. Counseled and has been resumed during this admission. 5. Bipolar 1 disorder: Apparently not taking medications at home. Seems stable without symptoms currently. 6. Type II DM: Last A1c: 5.7 (2014). Was not taking metformin at home. While hospitalized and on steroids, he was on Lantus and SSI. Now that his steroids are being tapered down, his glycemic control should improve. Started metformin 500 MG twice a day at discharge. Counseled regarding compliance. Follow A1c as outpatient if not performed recently. 7. Thrombocytopenia, chronic mild and intermittent: Seems stable. Follow CBCs periodically. 8. Polysubstance abuse (current THC, former smoker,? Alcohol): UDS negative. CIWA protocol. States that he drinks half a pint of vodka every 4 days but has not consumed in 2 weeks due to acute illness. No withdrawal symptoms noted. 9. Abnormal chest x-ray: Right mid lung thin-walled cavitary lesion-chronic finding. Outpatient follow-up and evaluation as deemed necessary. Consider outpatient pulmonology  consultation. 10. Hyperkalemia, mild: ? Etiology. DC Lisinopril. Kayexalate 30 g by mouth 1 dose. Resolved. Follow BMP periodically as outpatient.   Consultants:   None  Procedures:   None  Discharge Instructions  Discharge Instructions  Call MD for:  difficulty breathing, headache or visual disturbances    Complete by:  As directed    Call MD for:  extreme fatigue    Complete by:  As directed    Call MD for:  persistant dizziness or light-headedness    Complete by:  As directed    Call MD for:  severe uncontrolled pain    Complete by:  As directed    Call MD for:  temperature >100.4    Complete by:  As directed    Diet - low sodium heart healthy    Complete by:  As directed    Diet Carb Modified    Complete by:  As directed    Discharge instructions    Complete by:  As directed    Continue home oxygen at 1 L/m via nasal cannula, continuously.   Increase activity slowly    Complete by:  As directed        Medication List    STOP taking these medications   furosemide 40 MG tablet Commonly known as:  LASIX   HYDROcodone-acetaminophen 5-325 MG tablet Commonly known as:  NORCO/VICODIN   lisinopril 10 MG tablet Commonly known as:  PRINIVIL,ZESTRIL   QUEtiapine 50 MG tablet Commonly known as:  SEROQUEL   ramelteon 8 MG tablet Commonly known as:  ROZEREM   risperiDONE 1 MG tablet Commonly known as:  RISPERDAL   traZODone 100 MG tablet Commonly known as:  DESYREL     TAKE these medications   amLODipine 5 MG tablet Commonly known as:  NORVASC Take 1 tablet (5 mg total) by mouth daily. Start taking on:  10/19/2016   benzonatate 200 MG capsule Commonly known as:  TESSALON Take 1 capsule (200 mg total) by mouth 3 (three) times daily as needed for cough.   fluticasone 50 MCG/ACT nasal spray Commonly known as:  FLONASE Place 2 sprays into both nostrils daily. Start taking on:  10/19/2016   folic acid 1 MG tablet Commonly known as:  FOLVITE Take 1  tablet (1 mg total) by mouth daily. Start taking on:  10/19/2016   Ipratropium-Albuterol 20-100 MCG/ACT Aers respimat Commonly known as:  COMBIVENT RESPIMAT Inhale 1 puff into the lungs every 6 (six) hours as needed for wheezing or shortness of breath.   metFORMIN 500 MG tablet Commonly known as:  GLUCOPHAGE Take 1 tablet (500 mg total) by mouth 2 (two) times daily with a meal. What changed:  when to take this  additional instructions   multivitamin with minerals Tabs tablet Take 1 tablet by mouth daily. Start taking on:  10/19/2016   nicotine 21 mg/24hr patch Commonly known as:  NICODERM CQ - dosed in mg/24 hours Place 1 patch (21 mg total) onto the skin daily. Start taking on:  10/19/2016   pravastatin 40 MG tablet Commonly known as:  PRAVACHOL Take 1 tablet (40 mg total) by mouth daily. What changed:  additional instructions   predniSONE 10 MG tablet Commonly known as:  DELTASONE Take 4 tabs daily for 3 days, then 3 tabs daily for 3 days, then 2 tabs daily for 3 days, then 1 tablet daily for 3 days, then stop.   thiamine 100 MG tablet Take 1 tablet (100 mg total) by mouth daily. Start taking on:  10/19/2016      Follow-up Information    GURLEY,Scott, PA-C Follow up in 3 day(s).   Specialty:  Physician Assistant Why:  To be seen with repeat labs (CBC, BMP & A1C-  if not recently done). Contact information: [address] [place] [address] [phone]        [facility].   Specialty:  Home Health Services Why:  [facility] Contact information: [address] [place] [address] [phone]        [facility] Follow up.   Why:  home oxygen Contact information: [address] [place] [address] [phone]          No Known Allergies   Procedures/Studies: Dg Chest 2 View  Result Date: 10/16/2016 CLINICAL DATA:  Congestion and coughing. EXAM: CHEST  2 VIEW  COMPARISON:  10/13/2016 FINDINGS: Cardiomediastinal silhouette is normal. Mediastinal contours appear intact. There is no evidence of focal airspace consolidation, pleural effusion or pneumothorax. Again seen is a 5 cm thin walled cavity within the posterior segment of right lower lobe with small amount of fluid layering dependently. Osseous structures are without acute abnormality. Soft tissues are grossly normal. IMPRESSION: No evidence of focal airspace consolidation. 5 cm thin-walled cavity likely within the superior segment of the right lower lobe with small amount of fluid layering dependently. This finding was slightly smaller in 2014. Most likely etiology is post infectious, however evaluation with nonemergent chest CT may be considered for better characterisation. No evidence of lobar consolidation. Electronically Signed   By: Ted Mcalpineobrinka  Dimitrova M.D.   On: 10/16/2016 10:02   Dg Chest Port 1 View  Result Date: 10/13/2016 CLINICAL DATA:  Flu like symptoms.  Cough. EXAM: PORTABLE CHEST 1 VIEW COMPARISON:  03/16/2013 FINDINGS: The heart size and mediastinal contours are within normal limits. Both lungs are clear. The visualized skeletal structures are unremarkable. Relatively stable thin-walled cavitation in the right mid lung possibly related to remote prior infection. IMPRESSION: No active disease. Again noted in the right mid lung is a thin-walled cavitary lesion possibly related to old remote infection. Electronically Signed   By: Tollie Ethavid  Kwon M.D.   On: 10/13/2016 20:27      Subjective: Overall continues to feel better. States that his breathing is almost back to baseline. He states that he does not do much ambulation at home and has chronic dyspnea on exertion. No chest pain reported. Cough is nonproductive, mild and intermittent than much improved compared to admission.  Discharge Exam:  Vitals:   10/18/16 1034 10/18/16 1224 10/18/16 1227 10/18/16 1412  BP: (!) 130/57 (!) 143/84     Pulse:  92    Resp:  19  18  Temp:      TempSrc:      SpO2:  (!) 87% 90%   Weight:      Height:      Temperature: 97.20F.  General exam: Pleasant middle-aged male sitting up comfortably in bed. Respiratory system: Good breath sounds and essentially clear to auscultation except occasional rhonchi posteriorly. Respiratory effort normal. Cardiovascular system: S1 & S2 heard, RRR. No JVD, murmurs, rubs, gallops or clicks. No pedal edema.  Gastrointestinal system: Abdomen is nondistended, soft and nontender. No organomegaly or masses felt. Normal bowel sounds heard. Central nervous system: Alert and oriented. No focal neurological deficits. Extremities: Symmetric 5 x 5 power. Skin: No rashes, lesions or ulcers Psychiatry: Judgement and insight appear normal. Mood & affect appropriate.     The results of significant diagnostics from this hospitalization (including imaging, microbiology, ancillary and laboratory) are listed below for reference.     Microbiology: Recent Results (from the past 240  hour(s))  Culture, blood (routine x 2) Call MD if unable to obtain prior to antibiotics being given     Status: None (Preliminary result)   Collection Time: 10/14/16  5:59 AM  Result Value Ref Range Status   Specimen Description BLOOD RIGHT ARM  Final   Special Requests BOTTLES DRAWN AEROBIC AND ANAEROBIC 5CC  Final   Culture   Final    NO GROWTH 3 DAYS Performed at [facility], [address], [place], [place] [address]    Report Status PENDING  Incomplete  Culture, blood (routine x 2) Call MD if unable to obtain prior to antibiotics being given     Status: None (Preliminary result)   Collection Time: 10/14/16  5:59 AM  Result Value Ref Range Status   Specimen Description BLOOD RIGHT HAND  Final   Special Requests BOTTLES DRAWN AEROBIC ONLY 5CC  Final   Culture   Final    NO GROWTH 3 DAYS Performed at [facility], [address] [place], [place] [address]    Report  Status PENDING  Incomplete  Culture, sputum-assessment     Status: None   Collection Time: 10/14/16 11:55 PM  Result Value Ref Range Status   Specimen Description SPU  Final   Special Requests NONE  Final   Sputum evaluation THIS SPECIMEN IS ACCEPTABLE FOR SPUTUM CULTURE  Final   Report Status 10/14/2016 FINAL  Final  Culture, respiratory (NON-Expectorated)     Status: None   Collection Time: 10/14/16 11:55 PM  Result Value Ref Range Status   Specimen Description SPU  Final   Special Requests NONE Reflexed from [id number]  Final   Gram Stain   Final    FEW WBC PRESENT, PREDOMINANTLY PMN MODERATE GRAM POSITIVE COCCI IN CLUSTERS MODERATE GRAM POSITIVE COCCI IN PAIRS MODERATE GRAM POSITIVE RODS    Culture   Final    Consistent with normal respiratory flora. Performed at [facility], [address] [place], [place] [address]    Report Status 10/17/2016 FINAL  Final     Labs: BNP (last 3 results)  Recent Labs  10/13/16 1954  BNP 40.6   Basic Metabolic Panel:  Recent Labs Lab 10/13/16 1954 10/15/16 0544 10/16/16 0558 10/16/16 1630 10/17/16 0502  NA 133* 135 133* 132* 134*  K 4.4 5.0 5.3* 4.6 4.5  CL 95* 100* 96* 95* 97*  CO2 29 29 31  32 31  GLUCOSE 178* 228* 246* 308* 251*  BUN 17 18 18 19 18   CREATININE 0.82 0.83 0.59* 0.79 0.81  CALCIUM 8.5* 8.2* 8.2* 8.2* 8.3*   Liver Function Tests:  Recent Labs Lab 10/13/16 1954 10/15/16 0544  AST 50* 34  ALT 33 35  ALKPHOS 59 51  BILITOT 2.1* 1.0  PROT 6.8 5.7*  ALBUMIN 3.4* 3.1*   CBC:  Recent Labs Lab 10/13/16 1954 10/15/16 0544  WBC 4.2 5.7  NEUTROABS 3.3  --   HGB 15.6 13.5  HCT 43.6 40.5  MCV 93.6 96.2  PLT 132* 144*   CBG:  Recent Labs Lab 10/17/16 1236 10/17/16 1719 10/17/16 2115 10/18/16 0743 10/18/16 1205  GLUCAP 211* 283* 276* 185* 233*      Time coordinating discharge: Over 30 minutes  SIGNED:  Marcellus Scott, MD, FACP, FHM. [facility] Pager [phone]  If  7PM-7AM, please contact night-coverage [email or web address] Password [email or web address] 10/18/2016, 2:13 PM

## 2016-10-18 NOTE — Care Management Note (Signed)
Case Management Note  Patient Details  Name: Albert Hess MRN: [id number] Date of Birth: 07-04-1946  Subjective/Objective:  HHC orders placed-[facility] rep aware,home 02 orders placed-[facility] dme rep aware to deliver travel tank to rm prior d/c.Patient has pcp, informed of his obligation to his insurance plan w/script coverage to comply w/co pay-patient voiced understanding.No further CM needs.                  Action/Plan:d/c home w/HHC/dme   Expected Discharge Date:  10/16/16               Expected Discharge Plan:  Home w Home Health Services  In-House Referral:     Discharge planning Services  CM Consult  Post Acute Care Choice:    Choice offered to:  Patient  DME Arranged:  Oxygen DME Agency:  [facility]  HH Arranged:  [facility] Agency:     Status of Service:  Completed, signed off  If discussed at Long Length of Stay Meetings, dates discussed:    Additional Comments:  Lanier ClamMahabir, Alazne Quant, RN 10/18/2016, 1:41 PM

## 2016-10-18 NOTE — Care Management Note (Signed)
Case Management Note  Patient Details  Name: Randon GoldsmithMark M Geffre MRN: [id number] Date of Birth: [birth year]  Subjective/Objective:  Patient chose-[facility] for HHPT,rep Edwinna aware & following for HHPT,[id number] order. Noted home qualifications-await home 02 order-[facility] dme rep following.                 Action/Plan:d/c plan home w/HHC/home 02.   Expected Discharge Date:  10/16/16               Expected Discharge Plan:  Home w Home Health Services  In-House Referral:     Discharge planning Services  CM Consult  Post Acute Care Choice:    Choice offered to:  Patient  DME Arranged:    [facility]  [facility]    [facility] Agency:     Status of Service:  In process, will continue to follow  If discussed at Long Length of Stay Meetings, dates discussed:    Additional Comments:  Lanier ClamMahabir, Niko Penson, RN 10/18/2016, 12:32 PM

## 2016-10-18 NOTE — Progress Notes (Signed)
Addendum  Several hours after the patient's DC paperwork was completed, I was contacted by the charge RN stating that the Combivent inhaler was not covered under the patient's insurance and hence would not be able to afford it. I was asked to prescribe an alternate/affordable medication at a different pharmacy. She had consulted the CM for assistance. [facility] who advised me that the family wanted the albuterol inhaler & not the Combivent which would have costed ~ $ 40 more. I provided her a verbal order for Albuterol inhaler (2 puffs inhaled every 6 hourly as needed for wheezing or dyspnea, 1 inhaler without refills) and she [facility].  Marcellus ScottHONGALGI,Kamalani Mastro, MD, FACP, FHM. [facility] Pager [phone]  If 7PM-7AM, please contact night-coverage [email or web address] Password [email or web address] 10/18/2016, 8:30 PM

## 2016-10-18 NOTE — Care Management Note (Signed)
Case Management Note  Patient Details  Name: Randon GoldsmithMark M North MRN: [id number] Date of Birth: 07-05-1946  Subjective/Objective: Provided patient w/an otpt pharmacy list as a resource if needed for scripts-patient voiced understanding.                   Action/Plan:   Expected Discharge Date:  10/18/16               Expected Discharge Plan:  Home w Home Health Services  In-House Referral:     Discharge planning Services  CM Consult  Post Acute Care Choice:    Choice offered to:  Patient  DME Arranged:  Oxygen DME Agency:  [facility]  HH Arranged:  [facility] Agency:     Status of Service:  Completed, signed off  If discussed at Long Length of Stay Meetings, dates discussed:    Additional Comments:  Lanier ClamMahabir, Kynadee Dam, RN 10/18/2016, 2:16 PM

## 2016-10-18 NOTE — Progress Notes (Signed)
Pt son called back stating that insurance will not cover inhaler and they cannot afford $300.00.  MD paged regarding patient concerns.   CM paged to assist with resources. Michel BickersWandalyn Rogers CM to review and follow up with family and MD. Ernst BreachLong, Havanah Nelms A

## 2016-10-18 NOTE — Progress Notes (Signed)
Nutrition Follow-up  DOCUMENTATION CODES:   Obesity unspecified  INTERVENTION:  - Continue to encourage PO intakes. - RD will continue to monitor for needs prior to d/c.  NUTRITION DIAGNOSIS:   Increased nutrient needs related to chronic illness (COPD) as evidenced by estimated needs (for protein). -ongoing  GOAL:   Patient will meet greater than or equal to 90% of their needs -met  MONITOR:   PO intake, Weight trends, Labs, I & O's  ASSESSMENT:   71 y.o. male with medical history significant of COPD, hypertension, hyperlipidemia, diabetes mellitus, PTSD, schizophrenia, bipolar disorder, thrombocytopenia, tobacco abuse, marijuana abuse, medication noncompliance. who presents with shortness of breath and cough. Patient states that he has been coughing for almost a week, which has been progressively getting worse. He coughs up greenish colored sputum. He has mild subjective fever, and chills. Denies chest pain. He has runny nose, but no sore throat. No sick contacts recently. He speaks in full sentence. Patient denies nausea, vomiting, abdominal pain, diarrhea, symptoms of UTI or unilateral weakness. Patient is not taking any home medications currently.  1/30 Per review, pt consuming mainly 100% of meals since previous assessment. No new weight since 1/26 which indicates -3.7 kg difference between that date and date of admission (1/25). Will monitor if new weight is obtained during this admission. No nutrition-related needs at this time.   Medications reviewed; 1 mg oral folic acid/day, sliding scale Novolog, 3 units Novolog TID, 10 units Lantus/day, daily multivitamin with minerals, PRN IV Zofran, 40 mg Deltasone/day, 100 mg oral thiamine/day.  Labs reviewed; CBGs: 185 and 233 mg/dL today, Na: 134 mmol/L, Cl: 97 mmol/L, Ca: 8.3 mg/dL.   1/26 - Pt consumed 100% of breakfast this AM (omelet, banana, whole wheat toast, and coffee) without any abdominal pain or nausea.  - He has also  ordered lunch (grilled chicken salad, baked potato, and iced tea).  - Pt denies any abdominal pain or nausea/vomiting PTA.  - He was having some weakness and fatigue.  - Physical assessment shows no muscle and no fat wasting; no edema at this time.  - No recent weight hx available.  IVF: NS @ 125 mL/hr.    Diet Order:  Diet heart healthy/carb modified Room service appropriate? Yes; Fluid consistency: Thin  Skin:     Last BM:  1/29  Height:   Ht Readings from Last 1 Encounters:  10/14/16 _0  (1.854 m)    Weight:   Wt Readings from Last 1 Encounters:  10/14/16 257 lb 4.4 oz (116.7 kg)    Ideal Body Weight:  83.64 kg  BMI:  Body mass index is 33.94 kg/m.  Estimated Nutritional Needs:   Kcal:  1750- 1985 (15-17 kcal/kg)  Protein:  100-117 grams (1.2-1.4 grams/kg IBW)  Fluid:  1.8-2 L/day  EDUCATION NEEDS:   No education needs identified at this time    Jarome Matin, MS, RD, LDN, CNSC Inpatient Clinical Dietitian Pager # [phone] After hours/weekend pager # [phone]

## 2016-10-18 NOTE — Progress Notes (Signed)
Occupational Therapy Treatment Patient Details Name: Albert GoldsmithMark M Hess MRN: [id number] DOB: June 02, 1946 Today's Date: 10/18/2016    History of present illness 71 y.o. male with medical history significant of COPD, hypertension, hyperlipidemia, diabetes mellitus, PTSD, schizophrenia, bipolar disorder, thrombocytopenia, tobacco abuse, marijuana abuse, medication noncompliance and admitted for Acute respiratory failure with hypoxia due to COPD exacerbation; found to be positive for Flu A   OT comments  Encouraged pt to perform flutter valve  Follow Up Recommendations  No OT follow up    Equipment Recommendations  None recommended by OT    Recommendations for Other Services      Precautions / Restrictions Precautions Precautions: Fall Precaution Comments: monitor sats; droplet+flu Restrictions Weight Bearing Restrictions: No       Mobility Bed Mobility Overal bed mobility: Needs Assistance Bed Mobility: Sit to Supine     Supine to sit: Supervision     General bed mobility comments: pt reaached for OT to pull him up- OT suggested pt try to perform and he was able  Transfers Overall transfer level: Needs assistance Equipment used: None Transfers: Sit to/from Stand Sit to Stand: Min guard Stand pivot transfers: Min guard       General transfer comment: close guard for safety.         ADL Overall ADL's : Needs assistance/impaired     Grooming: Standing;Cueing for safety;Supervision/safety   Upper Body Bathing: Sitting;Cueing for safety;Cueing for sequencing   Lower Body Bathing: Moderate assistance;Sit to/from stand;Cueing for safety;Cueing for sequencing   Upper Body Dressing : Set up;Sitting   Lower Body Dressing: Moderate assistance;Cueing for safety;Cueing for sequencing;Sit to/from stand Lower Body Dressing Details (indicate cue type and reason): OT offered AE and pt declined . OT explained use of AE and how it could make it easier.  Pt stated he was not  interested and would not use Toilet Transfer: Minimal assistance;Stand-pivot Toilet Transfer Details (indicate cue type and reason): bed to chair           General ADL Comments: Provided energy conservation handout and strategies.  Pt coughing significantly- RN aware                Cognition   Behavior During Therapy: WFL for tasks assessed/performed Overall Cognitive Status: Within Functional Limits for tasks assessed                                    Pertinent Vitals/ Pain       Pain Assessment: No/denies pain         Frequency           Progress Toward Goals  OT Goals(current goals can now be found in the care plan section)  Progress towards OT goals: Progressing toward goals  Acute Rehab OT Goals Patient Stated Goal: be Independent OT Goal Formulation: With patient Time For Goal Achievement: 10/29/16 Potential to Achieve Goals: Good  Plan Discharge plan remains appropriate       End of Session     Activity Tolerance Patient limited by fatigue   Patient Left in chair;with call bell/phone within reach;with nursing/sitter in room   Nurse Communication Mobility status        Time: 1150-1209 OT Time Calculation (min): 19 min  Charges: OT General Charges $OT Visit: 1 Procedure OT Treatments $Self Care/Home Management : 8-22 mins  Nikeia Henkes, Karin GoldenLorraine D 10/18/2016, 12:21 PM

## 2016-10-19 DIAGNOSIS — J441 Chronic obstructive pulmonary disease with (acute) exacerbation: Secondary | ICD-10-CM | POA: Diagnosis not present

## 2016-10-19 DIAGNOSIS — I10 Essential (primary) hypertension: Secondary | ICD-10-CM | POA: Diagnosis not present

## 2016-10-19 DIAGNOSIS — E119 Type 2 diabetes mellitus without complications: Secondary | ICD-10-CM | POA: Diagnosis not present

## 2016-10-19 DIAGNOSIS — Z7984 Long term (current) use of oral hypoglycemic drugs: Secondary | ICD-10-CM | POA: Diagnosis not present

## 2016-10-19 DIAGNOSIS — J9601 Acute respiratory failure with hypoxia: Secondary | ICD-10-CM | POA: Diagnosis not present

## 2016-10-19 LAB — CULTURE, BLOOD (ROUTINE X 2)
CULTURE: NO GROWTH
CULTURE: NO GROWTH

## 2016-10-24 DIAGNOSIS — J9601 Acute respiratory failure with hypoxia: Secondary | ICD-10-CM | POA: Diagnosis not present

## 2016-10-24 DIAGNOSIS — I10 Essential (primary) hypertension: Secondary | ICD-10-CM | POA: Diagnosis not present

## 2016-10-24 DIAGNOSIS — J101 Influenza due to other identified influenza virus with other respiratory manifestations: Secondary | ICD-10-CM | POA: Diagnosis not present

## 2016-10-24 DIAGNOSIS — E119 Type 2 diabetes mellitus without complications: Secondary | ICD-10-CM | POA: Diagnosis not present

## 2016-11-02 ENCOUNTER — Institutional Professional Consult (permissible substitution): Payer: Self-pay | Admitting: Internal Medicine

## 2016-11-16 DIAGNOSIS — J441 Chronic obstructive pulmonary disease with (acute) exacerbation: Secondary | ICD-10-CM | POA: Diagnosis not present

## 2016-12-09 DIAGNOSIS — I10 Essential (primary) hypertension: Secondary | ICD-10-CM | POA: Diagnosis not present

## 2016-12-09 DIAGNOSIS — E782 Mixed hyperlipidemia: Secondary | ICD-10-CM | POA: Diagnosis not present

## 2016-12-09 DIAGNOSIS — E119 Type 2 diabetes mellitus without complications: Secondary | ICD-10-CM | POA: Diagnosis not present

## 2016-12-09 DIAGNOSIS — F3181 Bipolar II disorder: Secondary | ICD-10-CM | POA: Diagnosis not present

## 2016-12-17 DIAGNOSIS — J441 Chronic obstructive pulmonary disease with (acute) exacerbation: Secondary | ICD-10-CM | POA: Diagnosis not present

## 2017-01-16 DIAGNOSIS — J441 Chronic obstructive pulmonary disease with (acute) exacerbation: Secondary | ICD-10-CM | POA: Diagnosis not present

## 2017-04-27 DIAGNOSIS — E785 Hyperlipidemia, unspecified: Secondary | ICD-10-CM | POA: Diagnosis not present

## 2017-04-27 DIAGNOSIS — M79661 Pain in right lower leg: Secondary | ICD-10-CM | POA: Diagnosis not present

## 2017-04-27 DIAGNOSIS — I10 Essential (primary) hypertension: Secondary | ICD-10-CM | POA: Diagnosis not present

## 2017-04-27 DIAGNOSIS — E119 Type 2 diabetes mellitus without complications: Secondary | ICD-10-CM | POA: Diagnosis not present

## 2017-04-27 DIAGNOSIS — Z7984 Long term (current) use of oral hypoglycemic drugs: Secondary | ICD-10-CM | POA: Diagnosis not present

## 2017-04-27 DIAGNOSIS — S8991XA Unspecified injury of right lower leg, initial encounter: Secondary | ICD-10-CM | POA: Diagnosis not present

## 2017-04-27 DIAGNOSIS — Z79899 Other long term (current) drug therapy: Secondary | ICD-10-CM | POA: Diagnosis not present

## 2017-06-12 DIAGNOSIS — E119 Type 2 diabetes mellitus without complications: Secondary | ICD-10-CM | POA: Diagnosis not present

## 2017-06-12 DIAGNOSIS — I10 Essential (primary) hypertension: Secondary | ICD-10-CM | POA: Diagnosis not present

## 2017-06-12 DIAGNOSIS — E782 Mixed hyperlipidemia: Secondary | ICD-10-CM | POA: Diagnosis not present

## 2017-06-12 DIAGNOSIS — F3181 Bipolar II disorder: Secondary | ICD-10-CM | POA: Diagnosis not present

## 2017-11-28 DIAGNOSIS — I10 Essential (primary) hypertension: Secondary | ICD-10-CM | POA: Diagnosis not present

## 2017-11-28 DIAGNOSIS — E782 Mixed hyperlipidemia: Secondary | ICD-10-CM | POA: Diagnosis not present

## 2017-11-28 DIAGNOSIS — E119 Type 2 diabetes mellitus without complications: Secondary | ICD-10-CM | POA: Diagnosis not present

## 2017-11-28 DIAGNOSIS — Z0000 Encounter for general adult medical examination without abnormal findings: Secondary | ICD-10-CM | POA: Diagnosis not present

## 2018-10-11 ENCOUNTER — Emergency Department (HOSPITAL_COMMUNITY): Payer: Medicare Other

## 2018-10-11 ENCOUNTER — Inpatient Hospital Stay (HOSPITAL_COMMUNITY)
Admission: EM | Admit: 2018-10-11 | Discharge: 2018-10-19 | DRG: 308 | Disposition: A | Discharge status: Skilled Nursing Facility | Payer: Medicare Other | Attending: Internal Medicine | Admitting: Internal Medicine

## 2018-10-11 ENCOUNTER — Encounter (HOSPITAL_COMMUNITY): Payer: Self-pay | Admitting: Emergency Medicine

## 2018-10-11 ENCOUNTER — Other Ambulatory Visit: Payer: Self-pay

## 2018-10-11 DIAGNOSIS — R262 Difficulty in walking, not elsewhere classified: Secondary | ICD-10-CM | POA: Diagnosis not present

## 2018-10-11 DIAGNOSIS — R52 Pain, unspecified: Secondary | ICD-10-CM | POA: Diagnosis not present

## 2018-10-11 DIAGNOSIS — E871 Hypo-osmolality and hyponatremia: Secondary | ICD-10-CM | POA: Diagnosis not present

## 2018-10-11 DIAGNOSIS — F1020 Alcohol dependence, uncomplicated: Secondary | ICD-10-CM | POA: Diagnosis present

## 2018-10-11 DIAGNOSIS — R0682 Tachypnea, not elsewhere classified: Secondary | ICD-10-CM

## 2018-10-11 DIAGNOSIS — Y901 Blood alcohol level of 20-39 mg/100 ml: Secondary | ICD-10-CM | POA: Diagnosis present

## 2018-10-11 DIAGNOSIS — E119 Type 2 diabetes mellitus without complications: Secondary | ICD-10-CM | POA: Diagnosis not present

## 2018-10-11 DIAGNOSIS — Y92009 Unspecified place in unspecified non-institutional (private) residence as the place of occurrence of the external cause: Secondary | ICD-10-CM

## 2018-10-11 DIAGNOSIS — R911 Solitary pulmonary nodule: Secondary | ICD-10-CM | POA: Diagnosis present

## 2018-10-11 DIAGNOSIS — S3993XA Unspecified injury of pelvis, initial encounter: Secondary | ICD-10-CM | POA: Diagnosis not present

## 2018-10-11 DIAGNOSIS — F4310 Post-traumatic stress disorder, unspecified: Secondary | ICD-10-CM | POA: Diagnosis present

## 2018-10-11 DIAGNOSIS — D649 Anemia, unspecified: Secondary | ICD-10-CM | POA: Diagnosis not present

## 2018-10-11 DIAGNOSIS — F10129 Alcohol abuse with intoxication, unspecified: Secondary | ICD-10-CM | POA: Diagnosis not present

## 2018-10-11 DIAGNOSIS — I4892 Unspecified atrial flutter: Secondary | ICD-10-CM | POA: Diagnosis not present

## 2018-10-11 DIAGNOSIS — S199XXA Unspecified injury of neck, initial encounter: Secondary | ICD-10-CM | POA: Diagnosis not present

## 2018-10-11 DIAGNOSIS — W1830XA Fall on same level, unspecified, initial encounter: Secondary | ICD-10-CM | POA: Diagnosis present

## 2018-10-11 DIAGNOSIS — E785 Hyperlipidemia, unspecified: Secondary | ICD-10-CM | POA: Diagnosis present

## 2018-10-11 DIAGNOSIS — J441 Chronic obstructive pulmonary disease with (acute) exacerbation: Secondary | ICD-10-CM | POA: Diagnosis present

## 2018-10-11 DIAGNOSIS — D696 Thrombocytopenia, unspecified: Secondary | ICD-10-CM | POA: Diagnosis present

## 2018-10-11 DIAGNOSIS — J690 Pneumonitis due to inhalation of food and vomit: Secondary | ICD-10-CM | POA: Diagnosis present

## 2018-10-11 DIAGNOSIS — R0602 Shortness of breath: Secondary | ICD-10-CM

## 2018-10-11 DIAGNOSIS — Z7984 Long term (current) use of oral hypoglycemic drugs: Secondary | ICD-10-CM | POA: Diagnosis not present

## 2018-10-11 DIAGNOSIS — J984 Other disorders of lung: Secondary | ICD-10-CM | POA: Diagnosis not present

## 2018-10-11 DIAGNOSIS — R0902 Hypoxemia: Secondary | ICD-10-CM | POA: Diagnosis not present

## 2018-10-11 DIAGNOSIS — S0121XS Laceration without foreign body of nose, sequela: Secondary | ICD-10-CM | POA: Diagnosis not present

## 2018-10-11 DIAGNOSIS — I4891 Unspecified atrial fibrillation: Secondary | ICD-10-CM | POA: Diagnosis present

## 2018-10-11 DIAGNOSIS — R069 Unspecified abnormalities of breathing: Secondary | ICD-10-CM

## 2018-10-11 DIAGNOSIS — I10 Essential (primary) hypertension: Secondary | ICD-10-CM | POA: Diagnosis present

## 2018-10-11 DIAGNOSIS — J9601 Acute respiratory failure with hypoxia: Secondary | ICD-10-CM | POA: Diagnosis not present

## 2018-10-11 DIAGNOSIS — R404 Transient alteration of awareness: Secondary | ICD-10-CM | POA: Diagnosis not present

## 2018-10-11 DIAGNOSIS — F319 Bipolar disorder, unspecified: Secondary | ICD-10-CM | POA: Diagnosis not present

## 2018-10-11 DIAGNOSIS — R079 Chest pain, unspecified: Secondary | ICD-10-CM | POA: Diagnosis not present

## 2018-10-11 DIAGNOSIS — F209 Schizophrenia, unspecified: Secondary | ICD-10-CM | POA: Diagnosis not present

## 2018-10-11 DIAGNOSIS — Z23 Encounter for immunization: Secondary | ICD-10-CM

## 2018-10-11 DIAGNOSIS — Z7401 Bed confinement status: Secondary | ICD-10-CM | POA: Diagnosis not present

## 2018-10-11 DIAGNOSIS — F17210 Nicotine dependence, cigarettes, uncomplicated: Secondary | ICD-10-CM | POA: Diagnosis present

## 2018-10-11 DIAGNOSIS — S0990XA Unspecified injury of head, initial encounter: Secondary | ICD-10-CM | POA: Diagnosis not present

## 2018-10-11 DIAGNOSIS — S0180XA Unspecified open wound of other part of head, initial encounter: Secondary | ICD-10-CM

## 2018-10-11 DIAGNOSIS — J80 Acute respiratory distress syndrome: Secondary | ICD-10-CM | POA: Diagnosis not present

## 2018-10-11 DIAGNOSIS — F1010 Alcohol abuse, uncomplicated: Secondary | ICD-10-CM | POA: Diagnosis present

## 2018-10-11 DIAGNOSIS — S0121XA Laceration without foreign body of nose, initial encounter: Secondary | ICD-10-CM

## 2018-10-11 DIAGNOSIS — S0121XD Laceration without foreign body of nose, subsequent encounter: Secondary | ICD-10-CM | POA: Diagnosis not present

## 2018-10-11 DIAGNOSIS — F3181 Bipolar II disorder: Secondary | ICD-10-CM | POA: Diagnosis present

## 2018-10-11 DIAGNOSIS — Z7951 Long term (current) use of inhaled steroids: Secondary | ICD-10-CM

## 2018-10-11 DIAGNOSIS — S299XXA Unspecified injury of thorax, initial encounter: Secondary | ICD-10-CM | POA: Diagnosis not present

## 2018-10-11 DIAGNOSIS — J9602 Acute respiratory failure with hypercapnia: Secondary | ICD-10-CM | POA: Diagnosis not present

## 2018-10-11 DIAGNOSIS — M2550 Pain in unspecified joint: Secondary | ICD-10-CM | POA: Diagnosis not present

## 2018-10-11 DIAGNOSIS — R000 Tachycardia, unspecified: Secondary | ICD-10-CM | POA: Diagnosis not present

## 2018-10-11 DIAGNOSIS — Z720 Tobacco use: Secondary | ICD-10-CM | POA: Diagnosis present

## 2018-10-11 DIAGNOSIS — Z9181 History of falling: Secondary | ICD-10-CM | POA: Diagnosis not present

## 2018-10-11 DIAGNOSIS — R9431 Abnormal electrocardiogram [ECG] [EKG]: Secondary | ICD-10-CM | POA: Diagnosis not present

## 2018-10-11 DIAGNOSIS — S022XXA Fracture of nasal bones, initial encounter for closed fracture: Secondary | ICD-10-CM | POA: Diagnosis present

## 2018-10-11 DIAGNOSIS — I483 Typical atrial flutter: Secondary | ICD-10-CM | POA: Diagnosis not present

## 2018-10-11 DIAGNOSIS — E7800 Pure hypercholesterolemia, unspecified: Secondary | ICD-10-CM | POA: Diagnosis not present

## 2018-10-11 HISTORY — DX: Alcohol abuse, uncomplicated: F10.10

## 2018-10-11 HISTORY — DX: Pneumonitis due to inhalation of food and vomit: J69.0

## 2018-10-11 HISTORY — DX: Unspecified atrial flutter: I48.92

## 2018-10-11 HISTORY — DX: Obesity, unspecified: E66.9

## 2018-10-11 LAB — I-STAT TROPONIN, ED: Troponin i, poc: 0.02 ng/mL (ref 0.00–0.08)

## 2018-10-11 LAB — CBC WITH DIFFERENTIAL/PLATELET
Abs Immature Granulocytes: 0.03 10*3/uL (ref 0.00–0.07)
BASOS ABS: 0 10*3/uL (ref 0.0–0.1)
Basophils Relative: 1 %
Eosinophils Absolute: 0 10*3/uL (ref 0.0–0.5)
Eosinophils Relative: 1 %
HCT: 50 % (ref 39.0–52.0)
Hemoglobin: 16.8 g/dL (ref 13.0–17.0)
Immature Granulocytes: 1 %
Lymphocytes Relative: 16 %
Lymphs Abs: 0.8 10*3/uL (ref 0.7–4.0)
MCH: 34.6 pg — AB (ref 26.0–34.0)
MCHC: 33.6 g/dL (ref 30.0–36.0)
MCV: 102.9 fL — ABNORMAL HIGH (ref 80.0–100.0)
MONO ABS: 0.5 10*3/uL (ref 0.1–1.0)
Monocytes Relative: 9 %
NEUTROS ABS: 3.9 10*3/uL (ref 1.7–7.7)
NEUTROS PCT: 72 %
Platelets: UNDETERMINED 10*3/uL (ref 150–400)
RBC: 4.86 MIL/uL (ref 4.22–5.81)
RDW: 12.6 % (ref 11.5–15.5)
WBC: 5.3 10*3/uL (ref 4.0–10.5)
nRBC: 0 % (ref 0.0–0.2)

## 2018-10-11 LAB — COMPREHENSIVE METABOLIC PANEL
ALT: 60 U/L — ABNORMAL HIGH (ref 0–44)
AST: 93 U/L — ABNORMAL HIGH (ref 15–41)
Albumin: 3.7 g/dL (ref 3.5–5.0)
Alkaline Phosphatase: 62 U/L (ref 38–126)
Anion gap: 20 — ABNORMAL HIGH (ref 5–15)
BUN: 12 mg/dL (ref 8–23)
CALCIUM: 9 mg/dL (ref 8.9–10.3)
CO2: 20 mmol/L — ABNORMAL LOW (ref 22–32)
Chloride: 94 mmol/L — ABNORMAL LOW (ref 98–111)
Creatinine, Ser: 0.94 mg/dL (ref 0.61–1.24)
GFR calc Af Amer: >60 mL/min (ref >60)
GFR calc non Af Amer: >60 mL/min (ref >60)
Glucose, Bld: 72 mg/dL (ref 70–99)
Potassium: 4.5 mmol/L (ref 3.5–5.1)
Sodium: 134 mmol/L — ABNORMAL LOW (ref 135–145)
Total Bilirubin: 3.1 mg/dL — ABNORMAL HIGH (ref 0.3–1.2)
Total Protein: 6.8 g/dL (ref 6.5–8.1)

## 2018-10-11 LAB — CK: Total CK: 287 U/L (ref 49–397)

## 2018-10-11 LAB — GLUCOSE, CAPILLARY: Glucose-Capillary: 116 mg/dL — ABNORMAL HIGH (ref 70–99)

## 2018-10-11 LAB — ETHANOL: Alcohol, Ethyl (B): 229 mg/dL — ABNORMAL HIGH (ref <10)

## 2018-10-11 MED ORDER — DILTIAZEM HCL 25 MG/5ML IV SOLN
10.0000 mg | Freq: Once | INTRAVENOUS | Status: AC
  Administered 2018-10-11: 10 mg via INTRAVENOUS
  Filled 2018-10-11: qty 5

## 2018-10-11 MED ORDER — FOLIC ACID 5 MG/ML IJ SOLN
1.0000 mg | Freq: Every day | INTRAMUSCULAR | Status: DC
  Administered 2018-10-12 – 2018-10-14 (×3): 1 mg via INTRAVENOUS
  Filled 2018-10-11 (×3): qty 0.2

## 2018-10-11 MED ORDER — THIAMINE HCL 100 MG/ML IJ SOLN
100.0000 mg | Freq: Once | INTRAMUSCULAR | Status: AC
  Administered 2018-10-11: 100 mg via INTRAVENOUS
  Filled 2018-10-11: qty 2

## 2018-10-11 MED ORDER — SODIUM CHLORIDE 0.9 % IV BOLUS
1000.0000 mL | Freq: Once | INTRAVENOUS | Status: AC
  Administered 2018-10-11: 1000 mL via INTRAVENOUS

## 2018-10-11 MED ORDER — LEVALBUTEROL HCL 0.63 MG/3ML IN NEBU: 0.6300 mg | INHALATION_SOLUTION | Freq: Four times a day (QID) | RESPIRATORY_TRACT | Status: DC

## 2018-10-11 MED ORDER — LORAZEPAM 2 MG/ML IJ SOLN: 2.0000 mg | INTRAMUSCULAR | Status: DC | PRN

## 2018-10-11 MED ORDER — ACETAMINOPHEN 650 MG RE SUPP: 650.0000 mg | Freq: Four times a day (QID) | RECTAL | Status: DC | PRN

## 2018-10-11 MED ORDER — SODIUM CHLORIDE 0.9 % IV SOLN
INTRAVENOUS | Status: AC
  Administered 2018-10-11: 23:00:00 via INTRAVENOUS

## 2018-10-11 MED ORDER — LEVALBUTEROL HCL 0.63 MG/3ML IN NEBU
0.6300 mg | INHALATION_SOLUTION | Freq: Four times a day (QID) | RESPIRATORY_TRACT | Status: DC | PRN
  Administered 2018-10-14 (×2): 0.63 mg via RESPIRATORY_TRACT
  Filled 2018-10-11 (×2): qty 3

## 2018-10-11 MED ORDER — DILTIAZEM HCL-DEXTROSE 100-5 MG/100ML-% IV SOLN (PREMIX)
5.0000 mg/h | INTRAVENOUS | Status: DC
  Administered 2018-10-11: 5 mg/h via INTRAVENOUS
  Administered 2018-10-12: 15 mg/h via INTRAVENOUS
  Administered 2018-10-12: 10 mg/h via INTRAVENOUS
  Filled 2018-10-11 (×4): qty 100

## 2018-10-11 MED ORDER — LORAZEPAM 2 MG/ML IJ SOLN
1.0000 mg | Freq: Once | INTRAMUSCULAR | Status: AC
  Administered 2018-10-11: 1 mg via INTRAVENOUS
  Filled 2018-10-11: qty 1

## 2018-10-11 MED ORDER — METOPROLOL TARTRATE 5 MG/5ML IV SOLN: 5.0000 mg | Freq: Once | INTRAVENOUS | Status: DC

## 2018-10-11 MED ORDER — ACETAMINOPHEN 325 MG PO TABS
650.0000 mg | ORAL_TABLET | Freq: Four times a day (QID) | ORAL | Status: DC | PRN
  Administered 2018-10-16 – 2018-10-19 (×2): 650 mg via ORAL
  Filled 2018-10-11 (×3): qty 2

## 2018-10-11 MED ORDER — SODIUM CHLORIDE 0.9 % IV BOLUS
500.0000 mL | Freq: Once | INTRAVENOUS | Status: AC
  Administered 2018-10-11: 500 mL via INTRAVENOUS

## 2018-10-11 MED ORDER — ONDANSETRON HCL 4 MG/2ML IJ SOLN: 4.0000 mg | Freq: Four times a day (QID) | INTRAMUSCULAR | Status: DC | PRN

## 2018-10-11 MED ORDER — TETANUS-DIPHTH-ACELL PERTUSSIS 5-2.5-18.5 LF-MCG/0.5 IM SUSP
0.5000 mL | Freq: Once | INTRAMUSCULAR | Status: AC
  Administered 2018-10-11: 0.5 mL via INTRAMUSCULAR
  Filled 2018-10-11: qty 0.5

## 2018-10-11 MED ORDER — ONDANSETRON HCL 4 MG PO TABS: 4.0000 mg | ORAL_TABLET | Freq: Four times a day (QID) | ORAL | Status: DC | PRN

## 2018-10-11 MED ORDER — INSULIN ASPART 100 UNIT/ML ~~LOC~~ SOLN
0.0000 [IU] | Freq: Three times a day (TID) | SUBCUTANEOUS | Status: DC
  Administered 2018-10-12: 1 [IU] via SUBCUTANEOUS
  Administered 2018-10-12 – 2018-10-13 (×3): 2 [IU] via SUBCUTANEOUS
  Administered 2018-10-13 – 2018-10-14 (×2): 1 [IU] via SUBCUTANEOUS
  Administered 2018-10-14: 2 [IU] via SUBCUTANEOUS
  Administered 2018-10-15 – 2018-10-17 (×2): 1 [IU] via SUBCUTANEOUS
  Administered 2018-10-18: 2 [IU] via SUBCUTANEOUS

## 2018-10-11 MED ORDER — IOPAMIDOL (ISOVUE-370) INJECTION 76%
INTRAVENOUS | Status: AC
  Filled 2018-10-11: qty 100

## 2018-10-11 MED ORDER — SODIUM CHLORIDE 0.9 % IV SOLN
1.0000 g | INTRAVENOUS | Status: DC
  Filled 2018-10-11: qty 10

## 2018-10-11 MED ORDER — SODIUM CHLORIDE 0.9 % IV SOLN
500.0000 mg | INTRAVENOUS | Status: DC
  Filled 2018-10-11: qty 500

## 2018-10-11 MED ORDER — THIAMINE HCL 100 MG/ML IJ SOLN
100.0000 mg | Freq: Every day | INTRAMUSCULAR | Status: DC
  Administered 2018-10-12 – 2018-10-13 (×2): 100 mg via INTRAVENOUS
  Filled 2018-10-11 (×2): qty 2

## 2018-10-11 MED ORDER — LEVOFLOXACIN IN D5W 750 MG/150ML IV SOLN
750.0000 mg | Freq: Once | INTRAVENOUS | Status: AC
  Administered 2018-10-11: 750 mg via INTRAVENOUS
  Filled 2018-10-11: qty 150

## 2018-10-11 MED ORDER — IOPAMIDOL (ISOVUE-370) INJECTION 76%
100.0000 mL | Freq: Once | INTRAVENOUS | Status: AC | PRN
  Administered 2018-10-11: 100 mL via INTRAVENOUS

## 2018-10-11 MED ORDER — SODIUM CHLORIDE 0.9% FLUSH: 10.0000 mL | INTRAVENOUS | Status: DC | PRN

## 2018-10-11 NOTE — ED Notes (Signed)
Patient transported to CT 

## 2018-10-11 NOTE — ED Provider Notes (Addendum)
[facility] EMERGENCY DEPARTMENT Provider Note   CSN: [id number] Arrival date & time: 10/11/18  1348     History   Chief Complaint Chief Complaint  Patient presents with  . ETOH/FALL    HPI Albert Hess is a 73 y.o. male.  The history is provided by the patient.  Fall  This is a new problem. Episode onset: unknown. The problem has been resolved. Pertinent negatives include no chest pain, no abdominal pain, no headaches and no shortness of breath. Nothing aggravates the symptoms. Nothing relieves the symptoms. He has tried nothing for the symptoms. The treatment provided no relief.    Past Medical History:  Diagnosis Date  . Bipolar 2 disorder (HCC)   . COPD (chronic obstructive pulmonary disease) (HCC)   . Diabetes mellitus without complication (HCC)   . High cholesterol   . Hypertension   . Mental disorder   . PTSD (post-traumatic stress disorder)   . Schizophrenia [facility])     Patient Active Problem List   Diagnosis Date Noted  . Influenza A 10/14/2016  . Acute respiratory failure with hypoxia (HCC) 10/13/2016  . COPD exacerbation (HCC) 10/13/2016  . Essential hypertension 10/13/2016  . Diabetes mellitus without complication (HCC) 10/13/2016  . HLD (hyperlipidemia) 10/13/2016  . Tobacco abuse 10/13/2016  . Dehydration 03/16/2013  . AKI (acute kidney injury) (HCC) 03/16/2013  . Anemia 03/16/2013  . Thrombocytopenia (HCC) 03/16/2013  . Edema of both legs 01/14/2013  . Ulcer of foot (HCC) 12/29/2012  . Bipolar I disorder, most recent episode (or current) manic (HCC) 12/26/2012  . Hyponatremia 12/26/2012  . Diabetes (HCC) 12/26/2012  . 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 Bipolar I disorder (HCC) 12/22/2012  . Transient alteration of awareness 12/22/2012    Past Surgical History:  Procedure Laterality Date  . right side     pt points to R side for surgery        Home Medications    Prior to Admission medications   Medication Sig Start  Date End Date Taking? Authorizing Provider  metFORMIN (GLUCOPHAGE) 500 MG tablet Take 1 tablet (500 mg total) by mouth 2 (two) times daily with a meal. 10/18/16  Yes Hongalgi, Maximino Greenland, MD  amLODipine (NORVASC) 5 MG tablet Take 1 tablet (5 mg total) by mouth daily. Patient not taking: Reported on 10/11/2018 10/19/16   Elease Etienne, MD  benzonatate (TESSALON) 200 MG capsule Take 1 capsule (200 mg total) by mouth 3 (three) times daily as needed for cough. Patient not taking: Reported on 10/11/2018 10/18/16   Elease Etienne, MD  fluticasone [facility]) 50 MCG/ACT nasal spray Place 2 sprays into both nostrils daily. Patient not taking: Reported on 10/11/2018 10/19/16   Elease Etienne, MD  folic acid (FOLVITE) 1 MG tablet Take 1 tablet (1 mg total) by mouth daily. Patient not taking: Reported on 10/11/2018 10/19/16   Elease Etienne, MD  Ipratropium-Albuterol (COMBIVENT RESPIMAT) 20-100 MCG/ACT AERS respimat Inhale 1 puff into the lungs every 6 (six) hours as needed for wheezing or shortness of breath. Patient not taking: Reported on 10/11/2018 10/18/16   Elease Etienne, MD  Multiple Vitamin (MULTIVITAMIN WITH MINERALS) TABS tablet Take 1 tablet by mouth daily. Patient not taking: Reported on 10/11/2018 10/19/16   Elease Etienne, MD  nicotine (NICODERM CQ - DOSED IN MG/24 HOURS) 21 mg/24hr patch Place 1 patch (21 mg total) onto the skin daily. Patient not taking: Reported on 10/11/2018 10/19/16   Elease Etienne, MD  pravastatin (PRAVACHOL) 40 MG tablet Take 1 tablet (40 mg total) by mouth daily. Patient not taking: Reported on 10/11/2018 10/18/16   Elease Etienne, MD  predniSONE (DELTASONE) 10 MG tablet Take 4 tabs daily for 3 days, then 3 tabs daily for 3 days, then 2 tabs daily for 3 days, then 1 tablet daily for 3 days, then stop. Patient not taking: Reported on 10/11/2018 10/18/16   Elease Etienne, MD  thiamine 100 MG tablet Take 1 tablet (100 mg total) by mouth daily. Patient not taking:  Reported on 10/11/2018 10/19/16   Elease Etienne, MD    Family History Family History  Problem Relation Age of Onset  . Suicidality Father     Social History Social History   Tobacco Use  . Smoking status: Light Tobacco Smoker    Packs/day: 0.00    Types: Cigarettes  . Smokeless tobacco: Never Used  Substance Use Topics  . Alcohol use: Yes    Comment: 6 pkg weekly  . Drug use: Yes    Types: Marijuana    Comment: "I take a hit in the morning to get me going"     Allergies   Patient has no known allergies.   Review of Systems Review of Systems  Constitutional: Negative for chills and fever.  HENT: Negative for ear pain and sore throat.   Eyes: Negative for pain and visual disturbance.  Respiratory: Negative for cough and shortness of breath.   Cardiovascular: Negative for chest pain and palpitations.  Gastrointestinal: Negative for abdominal pain and vomiting.  Genitourinary: Negative for dysuria and hematuria.  Musculoskeletal: Negative for arthralgias and back pain.  Skin: Positive for wound. Negative for color change and rash.  Neurological: Negative for seizures, syncope and headaches.  All other systems reviewed and are negative.    Physical Exam Updated Vital Signs  ED Triage Vitals  Enc Vitals Group     BP 10/11/18 1400 112/77     Pulse Rate 10/11/18 1515 (!) 126     Resp 10/11/18 1400 17     Temp --      Temp src --      SpO2 10/11/18 1515 93 %     Weight --      Height --      Head Circumference --      Peak Flow --      Pain Score 10/11/18 1354 0     Pain Loc --      Pain Edu? --      Excl. in GC? --     Physical Exam Vitals signs and nursing note reviewed.  Constitutional:      General: He is not in acute distress.    Appearance: He is well-developed. He is not ill-appearing.  HENT:     Head:     Comments: Abrasion to forehead    Nose: Nose normal.     Mouth/Throat:     Mouth: Mucous membranes are moist.     Pharynx: No  oropharyngeal exudate.  Eyes:     Extraocular Movements: Extraocular movements intact.     Conjunctiva/sclera: Conjunctivae normal.     Pupils: Pupils are equal, round, and reactive to light.  Neck:     Musculoskeletal: Normal range of motion and neck supple.  Cardiovascular:     Rate and Rhythm: Regular rhythm. Tachycardia present.     Pulses: Normal pulses.     Heart sounds: Normal heart sounds. No murmur.  Pulmonary:  Effort: Pulmonary effort is normal. No respiratory distress.     Breath sounds: Normal breath sounds.  Abdominal:     General: There is no distension.     Palpations: Abdomen is soft.     Tenderness: There is no abdominal tenderness.  Skin:    General: Skin is warm and dry.     Capillary Refill: Capillary refill takes less than 2 seconds.     Comments: Laceration around 3-4cm on nose, hemostatic  Neurological:     General: No focal deficit present.     Mental Status: He is alert and oriented to person, place, and time.     Cranial Nerves: No cranial nerve deficit.     Sensory: No sensory deficit.     Motor: No weakness.     Coordination: Coordination normal.  Psychiatric:        Mood and Affect: Mood normal.      ED Treatments / Results  Labs (all labs ordered are listed, but only abnormal results are displayed) Labs Reviewed  CBC WITH DIFFERENTIAL/PLATELET  COMPREHENSIVE METABOLIC PANEL  ETHANOL  CK  I-STAT TROPONIN, ED    EKG EKG Interpretation  Date/Time:  Thursday October 11 2018 14:54:30 EST Ventricular Rate:  142 PR Interval:    QRS Duration: 100 QT Interval:  291 QTC Calculation: 448 R Axis:   149 Text Interpretation:  Atrial flutter with varied AV block, Right axis deviation Low voltage, precordial leads Borderline T abnormalities, inferior leads Baseline wander in lead(s) V1 Confirmed by Virgina Norfolk [phone]) on 10/11/2018 3:19:10 PM Also confirmed by Virgina Norfolk [phone]), editor Josephine Igo ([id number])  on 10/11/2018 3:27:39  PM   Radiology Dg Pelvis Portable  Result Date: 10/11/2018 CLINICAL DATA:  Larey Seat in home today EXAM: PORTABLE PELVIS 1-2 VIEWS COMPARISON:  Portable exam 1506 hours without priors for comparison FINDINGS: Osseous mineralization normal. Hip and SI joint spaces preserved. No acute fracture, dislocation, or bone destruction. IMPRESSION: No acute osseous abnormalities. Electronically Signed   By: Ulyses Southward M.D.   On: 10/11/2018 15:24   Dg Chest Portable 1 View  Result Date: 10/11/2018 CLINICAL DATA:  Larey Seat at home today, history hypertension, diabetes mellitus, COPD, smoker EXAM: PORTABLE CHEST 1 VIEW COMPARISON:  Portable exam 1506 hours compared to 10/16/2016 FINDINGS: Normal heart size, mediastinal contours, and pulmonary vascularity. 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 Lungs clear. No infiltrate, pleural effusion or pneumothorax. No acute osseous findings. IMPRESSION: Interval increase in size of an air cyst/pneumatocele in the mid RIGHT lung now 6.6 x 6.9 cm. Otherwise negative exam. Electronically Signed   By: Ulyses Southward M.D.   On: 10/11/2018 15:23    Procedures .Marland KitchenLaceration Repair Date/Time: 10/11/2018 4:16 PM Performed by: Virgina Norfolk, DO Authorized by: Virgina Norfolk, DO   Consent:    Consent obtained:  Verbal   Consent given by:  Patient   Risks discussed:  Infection, nerve damage, need for additional repair, poor cosmetic result, poor wound healing, retained foreign body, pain, tendon damage and vascular damage   Alternatives discussed:  No treatment Laceration details:    Location:  Face (nose)   Face location:  Nose   Length (cm):  4   Depth (mm):  1 Repair type:    Repair type:  Simple Pre-procedure details:    Preparation:  Patient was prepped and draped in usual sterile fashion Exploration:    Wound exploration: wound explored through full range of motion  Wound extent: no areolar tissue violation noted,  no fascia violation noted, no foreign bodies/material noted, no muscle damage noted, no nerve damage noted, no tendon damage noted, no underlying fracture noted and no vascular damage noted     Contaminated: no   Treatment:    Area cleansed with:  Saline   Amount of cleaning:  Standard   Irrigation solution:  Sterile saline   Irrigation volume:  500 cc   Irrigation method:  Syringe   Visualized foreign bodies/material removed: no   Skin repair:    Repair method:  Tissue adhesive and Steri-Strips   Number of Steri-Strips:  6 Approximation:    Approximation:  Close Post-procedure details:    Dressing:  Open (no dressing)   Patient tolerance of procedure:  Tolerated well, no immediate complications   (including critical care time)  Medications Ordered in ED Medications  sodium chloride 0.9 % bolus 500 mL (500 mLs Intravenous New Bag/Given 10/11/18 1549)  Tdap (BOOSTRIX) injection 0.5 mL (has no administration in time range)  sodium chloride 0.9 % bolus 1,000 mL (0 mLs Intravenous Stopped 10/11/18 1612)  LORazepam (ATIVAN) injection 1 mg (1 mg Intravenous Given 10/11/18 1430)  LORazepam (ATIVAN) injection 1 mg (1 mg Intravenous Given 10/11/18 1504)  thiamine (B-1) injection 100 mg (100 mg Intravenous Given 10/11/18 1504)     Initial Impression / Assessment and Plan / ED Course  I have reviewed the triage vital signs and the nursing notes.  Pertinent labs & imaging results that were available during my care of the patient were reviewed by me and considered in my medical decision making (see chart for details).     Albert Hess is a 73 year old male with history of bipolar, COPD, diabetes, hypertension, high cholesterol, alcohol abuse who presents to the ED after a fall. Patient with tachycardia.  EKG shows possible atrial flutter versus sinus tach.  Patient denies any chest pain, palpitations.  States that he is a heavy alcohol user.  He drinks about 1/5 of vodka daily.  States that he  remembers drinking and then he blacked out.  Family member found him on the floor.  Has a laceration to his nose that is hemostatic.  He did not want any sutures.  Therefore Dermabond and Steri-Strips are placed.  Tetanus shot was given.  Patient states that he does withdraw from alcohol fairly quickly.  Therefore will give IV Ativan, IV fluid bolus.  Does not have any abdominal tenderness on exam.  No midline spinal tenderness.  Patient is in a c-collar.  He has normal neurological exam otherwise.  He does move all extremities without any issues.  We will get basic labs including CK. No nasal septal hematoma.  Chest x-ray shows pneumatocele that is slightly increased.  Pelvic x-ray unremarkable.  Therefore will add CT of his chest to further evaluate cystic-like structure in his chest.  Will obtain CT head, CT neck, CT face.  Lab still pending.  Patient signed out to Dr. Silverio LayYao with patient pending imaging, lab work.  Patient continues to be tachycardic.  May need further treatment if IV Ativan and IV fluids do not help.  Please see his note for further results, evaluation, disposition of the patient.  Would recommend Keflex at discharge due to nasal laceration.  Has history of broken nose in the past.  Could benefit from ENT follow-up.  However patient does not wish to have any surgery on his nose.  This chart was dictated using voice recognition software.  Despite best efforts to proofread,  errors can occur which can change the documentation meaning.   Final Clinical Impressions(s) / ED Diagnoses   Final diagnoses:  Laceration of nose, initial encounter    ED Discharge Orders    None       Virgina Norfolk, DO 10/11/18 1627    Virgina Norfolk, DO 10/11/18 1640

## 2018-10-11 NOTE — ED Triage Notes (Signed)
Patient arrived from home via [facility] with reports that he was found on the floor with possible drinking(ETOH) Patient states he drank a fifth of vodka and does not remember what happens after that. He reports drinking "half a gallon vodka every 3 days."

## 2018-10-11 NOTE — H&P (Signed)
History and Physical    Albert GoldsmithMark M Lemberger VQQ:[id number] DOB: 1945-11-28 DOA: 10/11/2018  PCP: Elvera LennoxGurley, Scott, PA-C  Patient coming from: Home.  Chief Complaint: Fall.  HPI: Albert Hess is a 73 y.o. male with history of alcohol abuse drinks vodka every day, diabetes mellitus type 2 on metformin, history of hypertension presently not on medication and history of bipolar disorder per the chart was brought to the ER the patient was found on the floor by patient's family member today.  Patient states he may have fallen last night and has remained on the floor since.  Patient states he does not recall exactly when he fell.  He drinks alcohol every day.  Denies any chest pain shortness of breath palpitation nausea vomiting abdominal pain or diarrhea.  ED Course: In the ER patient was found to be in atrial flutter with RVR.  Has multiple bruises on the face and extremities.  Has a periorbital hematoma on the right side and nasal bone fracture.  Some of the lacerations were Dermabonded.  Patient had CT head CT maxillofacial C-spine and CT angiogram of the chest.  CT scan showed a nasal bone fracture and CT angiogram of the chest showed progression of the patient's right-sided cystic lesion of the lung with air-fluid level.  Also was concern for pneumonia for which patient was started on antibiotics.  Since patient is in atrial flutter with RVR patient is started on Cardizem infusion.  Review of Systems: As per HPI, rest all negative.   Past Medical History:  Diagnosis Date  . Bipolar 2 disorder (HCC)   . COPD (chronic obstructive pulmonary disease) (HCC)   . Diabetes mellitus without complication (HCC)   . High cholesterol   . Hypertension   . Mental disorder   . PTSD (post-traumatic stress disorder)   . Schizophrenia [facility](HCC)     Past Surgical History:  Procedure Laterality Date  . right side     pt points to R side for surgery     reports that he has been smoking cigarettes. He has been smoking  about 0.00 packs per day. He has never used smokeless tobacco. He reports current alcohol use. He reports current drug use. Drug: Marijuana.  No Known Allergies  Family History  Problem Relation Age of Onset  . Suicidality Father     Prior to Admission medications   Medication Sig Start Date End Date Taking? Authorizing Provider  metFORMIN (GLUCOPHAGE) 500 MG tablet Take 1 tablet (500 mg total) by mouth 2 (two) times daily with a meal. 10/18/16  Yes Hongalgi, Maximino GreenlandAnand D, MD  amLODipine (NORVASC) 5 MG tablet Take 1 tablet (5 mg total) by mouth daily. Patient not taking: Reported on 10/11/2018 10/19/16   Elease EtienneHongalgi, Anand D, MD  benzonatate (TESSALON) 200 MG capsule Take 1 capsule (200 mg total) by mouth 3 (three) times daily as needed for cough. Patient not taking: Reported on 10/11/2018 10/18/16   Elease EtienneHongalgi, Anand D, MD  fluticasone [facility](FLONASE) 50 MCG/ACT nasal spray Place 2 sprays into both nostrils daily. Patient not taking: Reported on 10/11/2018 10/19/16   Elease EtienneHongalgi, Anand D, MD  folic acid (FOLVITE) 1 MG tablet Take 1 tablet (1 mg total) by mouth daily. Patient not taking: Reported on 10/11/2018 10/19/16   Elease EtienneHongalgi, Anand D, MD  Ipratropium-Albuterol (COMBIVENT RESPIMAT) 20-100 MCG/ACT AERS respimat Inhale 1 puff into the lungs every 6 (six) hours as needed for wheezing or shortness of breath. Patient not taking: Reported on 10/11/2018 10/18/16   Hongalgi,  Maximino Greenland, MD  Multiple Vitamin (MULTIVITAMIN WITH MINERALS) TABS tablet Take 1 tablet by mouth daily. Patient not taking: Reported on 10/11/2018 10/19/16   Elease Etienne, MD  nicotine (NICODERM CQ - DOSED IN MG/24 HOURS) 21 mg/24hr patch Place 1 patch (21 mg total) onto the skin daily. Patient not taking: Reported on 10/11/2018 10/19/16   Elease Etienne, MD  pravastatin (PRAVACHOL) 40 MG tablet Take 1 tablet (40 mg total) by mouth daily. Patient not taking: Reported on 10/11/2018 10/18/16   Elease Etienne, MD  predniSONE (DELTASONE) 10 MG tablet  Take 4 tabs daily for 3 days, then 3 tabs daily for 3 days, then 2 tabs daily for 3 days, then 1 tablet daily for 3 days, then stop. Patient not taking: Reported on 10/11/2018 10/18/16   Elease Etienne, MD  thiamine 100 MG tablet Take 1 tablet (100 mg total) by mouth daily. Patient not taking: Reported on 10/11/2018 10/19/16   Elease Etienne, MD    Physical Exam: Vitals:   10/11/18 2045 10/11/18 2115 10/11/18 2131 10/11/18 2206  BP: 126/83 120/86  133/71  Pulse: (!) 148 (!) 145  61  Resp: (!) 24 (!) 24    Temp:    (!) 97.5 F (36.4 C)  TempSrc:    Oral  SpO2: 91% 94% 98% 94%      Constitutional: Moderately built and nourished. Vitals:   10/11/18 2045 10/11/18 2115 10/11/18 2131 10/11/18 2206  BP: 126/83 120/86  133/71  Pulse: (!) 148 (!) 145  61  Resp: (!) 24 (!) 24    Temp:    (!) 97.5 F (36.4 C)  TempSrc:    Oral  SpO2: 91% 94% 98% 94%   Eyes: Anicteric no pallor.  Right periorbital hematoma. ENMT: Multiple bruises on the face. Neck: No neck mass felt.  No JVD appreciated. Respiratory: No rhonchi or crepitations. Cardiovascular: S1-S2 heard. Abdomen: Soft nontender bowel sounds present. Musculoskeletal: No edema.  No joint effusion.  Pain on moving all extremities. Skin: Multiple ecchymotic areas. Neurologic: Alert awake oriented to time place and person.  Moves all extremities. Psychiatric: Appears normal per normal affect.   Labs on Admission: I have personally reviewed following labs and imaging studies  CBC: Recent Labs  Lab 10/11/18 1551  WBC 5.3  NEUTROABS 3.9  HGB 16.8  HCT 50.0  MCV 102.9*  PLT PLATELET CLUMPS NOTED ON SMEAR, UNABLE TO ESTIMATE   Basic Metabolic Panel: Recent Labs  Lab 10/11/18 1552  NA 134*  K 4.5  CL 94*  CO2 20*  GLUCOSE 72  BUN 12  CREATININE 0.94  CALCIUM 9.0   GFR: CrCl cannot be calculated (Unknown ideal weight.). Liver Function Tests: Recent Labs  Lab 10/11/18 1552  AST 93*  ALT 60*  ALKPHOS 62    BILITOT 3.1*  PROT 6.8  ALBUMIN 3.7   No results for input(s): LIPASE, AMYLASE in the last 168 hours. No results for input(s): AMMONIA in the last 168 hours. Coagulation Profile: No results for input(s): INR, PROTIME in the last 168 hours. Cardiac Enzymes: Recent Labs  Lab 10/11/18 1552  CKTOTAL 287   BNP (last 3 results) No results for input(s): PROBNP in the last 8760 hours. HbA1C: No results for input(s): HGBA1C in the last 72 hours. CBG: Recent Labs  Lab 10/11/18 2225  GLUCAP 116*   Lipid Profile: No results for input(s): CHOL, HDL, LDLCALC, TRIG, CHOLHDL, LDLDIRECT in the last 72 hours. Thyroid Function Tests: No results for  input(s): TSH, T4TOTAL, FREET4, T3FREE, THYROIDAB in the last 72 hours. Anemia Panel: No results for input(s): VITAMINB12, FOLATE, FERRITIN, TIBC, IRON, RETICCTPCT in the last 72 hours. Urine analysis:    Component Value Date/Time   COLORURINE YELLOW 08/08/2015 0359   APPEARANCEUR CLEAR 08/08/2015 0359   LABSPEC 1.007 08/08/2015 0359   PHURINE 6.0 08/08/2015 0359   GLUCOSEU 100 (A) 08/08/2015 0359   HGBUR TRACE (A) 08/08/2015 0359   BILIRUBINUR NEGATIVE 08/08/2015 0359   KETONESUR NEGATIVE 08/08/2015 0359   PROTEINUR NEGATIVE 08/08/2015 0359   UROBILINOGEN 0.2 03/18/2013 0050   NITRITE NEGATIVE 08/08/2015 0359   LEUKOCYTESUR NEGATIVE 08/08/2015 0359   Sepsis Labs: @LABRCNTIP (procalcitonin:4,lacticidven:4) )No results found for this or any previous visit (from the past 240 hour(s)).   Radiological Exams on Admission: Ct Head Wo Contrast  Result Date: 10/11/2018 CLINICAL DATA:  Neck and back pain and nasal aspiration following a fall today while intoxicated. EXAM: CT HEAD WITHOUT CONTRAST CT MAXILLOFACIAL WITHOUT CONTRAST CT CERVICAL SPINE WITHOUT CONTRAST TECHNIQUE: Multidetector CT imaging of the head, cervical spine, and maxillofacial structures were performed using the standard protocol without intravenous contrast. Multiplanar CT  image reconstructions of the cervical spine and maxillofacial structures were also generated. COMPARISON:  Head and cervical spine CT dated 03/16/2013. FINDINGS: CT HEAD FINDINGS Brain: Mild-to-moderate enlargement of the ventricles and subarachnoid spaces with mild progression. Mild to moderate patchy white matter low density in both cerebral hemispheres with mild progression. No intracranial hemorrhage, mass lesion or CT evidence of acute infarction. Vascular: No hyperdense vessel or unexpected calcification. Skull: Normal. Negative for fracture or focal lesion. Other: None. CT MAXILLOFACIAL FINDINGS Osseous: Interval fracture of the mid nasal septum with angulation and mild displacement of the anterior portion to the left. Small fracture of the inferior aspect of the distal nasal bone a mild inferiorly and posteriorly displaced fragment. Orbits: Intact. Sinuses: Moderate right inferior maxillary sinus mucosal thickening. Mild left maxillary sinus mucosal thickening with a small amount of fluid in the sinus. Soft tissues: Anterior nasal soft tissue irregularity superiorly with multiple small locules of air within the underlying soft tissues. CT CERVICAL SPINE FINDINGS Alignment: Mild reversal of the normal cervical lordosis. No subluxations. Skull base and vertebrae: No acute fracture. No primary bone lesion or focal pathologic process. Soft tissues and spinal canal: No prevertebral fluid or swelling. No visible canal hematoma. Disc levels: Multilevel degenerative changes, most pronounced at the C5-6 and C6-7 levels. Upper chest: Clear lung apices. Other: Bilateral carotid artery calcifications. IMPRESSION: 1. Interval fracture of the mid nasal septum with angulation and mild displacement of the anterior portion to the left. 2. No skull fracture or intracranial hemorrhage. 3. No cervical spine fracture or subluxation. 4. Mildly progressive atrophy and chronic small vessel white matter ischemic changes in both  cerebral hemispheres. 5. Cervical spine degenerative changes. 6. Bilateral carotid artery atheromatous calcifications. 7. Chronic bilateral maxillary sinusitis with an acute component or blood in the left maxillary sinus. Electronically Signed   By: Beckie Salts M.D.   On: 10/11/2018 16:27   Ct Angio Chest Pe W And/or Wo Contrast  Result Date: 10/11/2018 CLINICAL DATA:  Chest pain possible pulmonary embolism. EXAM: CT ANGIOGRAPHY CHEST WITH CONTRAST TECHNIQUE: Multidetector CT imaging of the chest was performed using the standard protocol during bolus administration of intravenous contrast. Multiplanar CT image reconstructions and MIPs were obtained to evaluate the vascular anatomy. CONTRAST:  ISOVUE-370 IOPAMIDOL (ISOVUE-370) INJECTION 76% COMPARISON:  Chest x-ray 10/11/2018 and 10/16/2016 FINDINGS: Cardiovascular: Heart is  normal size. Calcified plaque over the left main and 3 vessel coronary arteries. Minimal calcified plaque over the thoracic aorta. Thoracic aorta is otherwise normal in caliber without evidence of dissection. Pulmonary arterial system is well opacified without evidence of emboli. Mediastinum/Nodes: No mediastinal or hilar adenopathy. Remaining mediastinal structures are normal. Lungs/Pleura: Lungs are adequately inflated with subtle hazy patchy airspace density bilaterally worse over the left lower lobe likely due to an infectious or inflammatory process. No effusion. Known chronic thin-walled cavitary lesion with small air-fluid level over the right lower lobe measuring 6.1 x 6.2 cm in AP and transverse dimension. This continues to slowly increase in size. There is no evidence of mural nodularity or focal wall thickening. 2 mm subpleural nodule over the lateral right upper lobe. Airways are otherwise unremarkable. Upper Abdomen: Mild diffuse low-attenuation of the liver. Minimal calcified plaque over the abdominal aorta. Mild cholelithiasis. Musculoskeletal: Degenerative change of the  spine. Review of the MIP images confirms the above findings. IMPRESSION: No evidence of pulmonary embolism. Subtle bilateral patchy hazy airspace process worse over the left lower lobe likely an acute infectious or inflammatory process. Chronic slowly increase in size of a thin-walled cavitary lesion over the right lower lobe with small air-fluid level. No suspicious wall thickening or mural nodularity. Aortic Atherosclerosis (ICD10-I70.0). Atherosclerotic coronary artery disease. Cholelithiasis. Mild hepatic steatosis. Electronically Signed   By: Elberta Fortis M.D.   On: 10/11/2018 19:11   Ct Cervical Spine Wo Contrast  Result Date: 10/11/2018 CLINICAL DATA:  Neck and back pain and nasal aspiration following a fall today while intoxicated. EXAM: CT HEAD WITHOUT CONTRAST CT MAXILLOFACIAL WITHOUT CONTRAST CT CERVICAL SPINE WITHOUT CONTRAST TECHNIQUE: Multidetector CT imaging of the head, cervical spine, and maxillofacial structures were performed using the standard protocol without intravenous contrast. Multiplanar CT image reconstructions of the cervical spine and maxillofacial structures were also generated. COMPARISON:  Head and cervical spine CT dated 03/16/2013. FINDINGS: CT HEAD FINDINGS Brain: Mild-to-moderate enlargement of the ventricles and subarachnoid spaces with mild progression. Mild to moderate patchy white matter low density in both cerebral hemispheres with mild progression. No intracranial hemorrhage, mass lesion or CT evidence of acute infarction. Vascular: No hyperdense vessel or unexpected calcification. Skull: Normal. Negative for fracture or focal lesion. Other: None. CT MAXILLOFACIAL FINDINGS Osseous: Interval fracture of the mid nasal septum with angulation and mild displacement of the anterior portion to the left. Small fracture of the inferior aspect of the distal nasal bone a mild inferiorly and posteriorly displaced fragment. Orbits: Intact. Sinuses: Moderate right inferior maxillary  sinus mucosal thickening. Mild left maxillary sinus mucosal thickening with a small amount of fluid in the sinus. Soft tissues: Anterior nasal soft tissue irregularity superiorly with multiple small locules of air within the underlying soft tissues. CT CERVICAL SPINE FINDINGS Alignment: Mild reversal of the normal cervical lordosis. No subluxations. Skull base and vertebrae: No acute fracture. No primary bone lesion or focal pathologic process. Soft tissues and spinal canal: No prevertebral fluid or swelling. No visible canal hematoma. Disc levels: Multilevel degenerative changes, most pronounced at the C5-6 and C6-7 levels. Upper chest: Clear lung apices. Other: Bilateral carotid artery calcifications. IMPRESSION: 1. Interval fracture of the mid nasal septum with angulation and mild displacement of the anterior portion to the left. 2. No skull fracture or intracranial hemorrhage. 3. No cervical spine fracture or subluxation. 4. Mildly progressive atrophy and chronic small vessel white matter ischemic changes in both cerebral hemispheres. 5. Cervical spine degenerative changes. 6. Bilateral carotid  artery atheromatous calcifications. 7. Chronic bilateral maxillary sinusitis with an acute component or blood in the left maxillary sinus. Electronically Signed   By: Beckie Salts M.D.   On: 10/11/2018 16:27   Dg Pelvis Portable  Result Date: 10/11/2018 CLINICAL DATA:  Larey Seat in home today EXAM: PORTABLE PELVIS 1-2 VIEWS COMPARISON:  Portable exam 1506 hours without priors for comparison FINDINGS: Osseous mineralization normal. Hip and SI joint spaces preserved. No acute fracture, dislocation, or bone destruction. IMPRESSION: No acute osseous abnormalities. Electronically Signed   By: Ulyses Southward M.D.   On: 10/11/2018 15:24   Dg Chest Portable 1 View  Result Date: 10/11/2018 CLINICAL DATA:  Larey Seat at home today, history hypertension, diabetes mellitus, COPD, smoker EXAM: PORTABLE CHEST 1 VIEW COMPARISON:  Portable  exam 1506 hours compared to 10/16/2016 FINDINGS: Normal heart size, mediastinal contours, and pulmonary vascularity. 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 Lungs clear. No infiltrate, pleural effusion or pneumothorax. No acute osseous findings. IMPRESSION: Interval increase in size of an air cyst/pneumatocele in the mid RIGHT lung now 6.6 x 6.9 cm. Otherwise negative exam. Electronically Signed   By: Ulyses Southward M.D.   On: 10/11/2018 15:23   Ct Maxillofacial Wo Contrast  Result Date: 10/11/2018 CLINICAL DATA:  Neck and back pain and nasal aspiration following a fall today while intoxicated. EXAM: CT HEAD WITHOUT CONTRAST CT MAXILLOFACIAL WITHOUT CONTRAST CT CERVICAL SPINE WITHOUT CONTRAST TECHNIQUE: Multidetector CT imaging of the head, cervical spine, and maxillofacial structures were performed using the standard protocol without intravenous contrast. Multiplanar CT image reconstructions of the cervical spine and maxillofacial structures were also generated. COMPARISON:  Head and cervical spine CT dated 03/16/2013. FINDINGS: CT HEAD FINDINGS Brain: Mild-to-moderate enlargement of the ventricles and subarachnoid spaces with mild progression. Mild to moderate patchy white matter low density in both cerebral hemispheres with mild progression. No intracranial hemorrhage, mass lesion or CT evidence of acute infarction. Vascular: No hyperdense vessel or unexpected calcification. Skull: Normal. Negative for fracture or focal lesion. Other: None. CT MAXILLOFACIAL FINDINGS Osseous: Interval fracture of the mid nasal septum with angulation and mild displacement of the anterior portion to the left. Small fracture of the inferior aspect of the distal nasal bone a mild inferiorly and posteriorly displaced fragment. Orbits: Intact. Sinuses: Moderate right inferior maxillary sinus mucosal thickening. Mild left maxillary sinus mucosal thickening with a small amount of  fluid in the sinus. Soft tissues: Anterior nasal soft tissue irregularity superiorly with multiple small locules of air within the underlying soft tissues. CT CERVICAL SPINE FINDINGS Alignment: Mild reversal of the normal cervical lordosis. No subluxations. Skull base and vertebrae: No acute fracture. No primary bone lesion or focal pathologic process. Soft tissues and spinal canal: No prevertebral fluid or swelling. No visible canal hematoma. Disc levels: Multilevel degenerative changes, most pronounced at the C5-6 and C6-7 levels. Upper chest: Clear lung apices. Other: Bilateral carotid artery calcifications. IMPRESSION: 1. Interval fracture of the mid nasal septum with angulation and mild displacement of the anterior portion to the left. 2. No skull fracture or intracranial hemorrhage. 3. No cervical spine fracture or subluxation. 4. Mildly progressive atrophy and chronic small vessel white matter ischemic changes in both cerebral hemispheres. 5. Cervical spine degenerative changes. 6. Bilateral carotid artery atheromatous calcifications. 7. Chronic bilateral maxillary sinusitis with an acute component or blood in the left maxillary sinus. Electronically Signed   By: Beckie Salts M.D.   On: 10/11/2018 16:27  EKG: Independently reviewed.  Atrial flutter with RVR.  Assessment/Plan Principal Problem:   Atrial flutter with rapid ventricular response (HCC) Active Problems:   Hyponatremia   Thrombocytopenia (HCC)   COPD exacerbation (HCC)   Essential hypertension   Diabetes mellitus without complication (HCC)   Tobacco abuse   Laceration of nose   Aspiration pneumonia (HCC)   Alcohol abuse    1. Atrial flutter with RVR -patient is on Cardizem infusion.  Since patient has periorbital hematoma and likely low platelet count and also multiple bruises not started on anticoagulant.  Will check TSH cardiac markers 2D echo.  Requested cardiology consult. 2. Possible aspiration pneumonia on empiric  antibiotics.  Follow cultures. 3. Cystic lesion in the right side of the lung has been progressing with air-fluid level will need pulmonary input in the morning. 4. Diabetes mellitus type 2 we will keep patient on sliding scale coverage. 5. Multiple bruises and nasal bone fracture and lacerations -for the nasal bone fracture patient may need eventually as outpatient referral to ENT.  Patient did receive tetanus toxoid in the ER. 6. Alcohol abuse -on CIWA protocol. 7. Thrombocytopenia -exact numbers not known.  Will repeat CBC.  Likely could be from alcoholism. 8. History of hypertension per the chart presently not on medication. 9. History of bipolar per the chart not on medications.   DVT prophylaxis: SCDs. Code Status: Full code. Family Communication: Discussed with patient. Disposition Plan: To be determined. Consults called: Cardiology. Admission status: Observation.   Eduard Clos MD [facility] Pager [phone].  If 7PM-7AM, please contact night-coverage [email or web address] Password [facility]  10/11/2018, 10:29 PM

## 2018-10-11 NOTE — ED Notes (Signed)
Diff to stick- IV team consulted for a 20G IV(CT angio)

## 2018-10-11 NOTE — ED Notes (Signed)
Report given to 6E RN. All questions answered 

## 2018-10-11 NOTE — ED Notes (Signed)
Please call daughter Yeiren Gregus to update her on pts condition. [phone]

## 2018-10-11 NOTE — ED Provider Notes (Signed)
  Physical Exam  BP 129/86   Pulse (!) 141   Resp 19   SpO2 95%   Physical Exam  ED Course/Procedures     Procedures  MDM  Patient care assumed at 4 pm. Patient found down at home. Has obvious facial injuries. Sign out pending labs, CT head/neck/face. Patient may be in rapid afib vs sinus tachycardia. Plan to give IVF and reassess.   8:47 PM Still in rapid aflutter rate 140s after 2 L NS bolus and ativan. CT head unremarkable. CTA chest showed pneumonia. ETOH 280, he does have AG acidosis as well. CK normal. Suspected that he aspirated. Given levaquin, cultures sent. Since he is still in rapid aflutter despite 2 L NS bolus, I started patient on cardizem bolus and drip. Hospitalist to admit for new onset aflutter.   CRITICAL CARE Performed by: Richardean Canal   Total critical care time: 30 minutes  Critical care time was exclusive of separately billable procedures and treating other patients.  Critical care was necessary to treat or prevent imminent or life-threatening deterioration.  Critical care was time spent personally by me on the following activities: development of treatment plan with patient and/or surrogate as well as nursing, discussions with consultants, evaluation of patient's response to treatment, examination of patient, obtaining history from patient or surrogate, ordering and performing treatments and interventions, ordering and review of laboratory studies, ordering and review of radiographic studies, pulse oximetry and re-evaluation of patient's condition.       Charlynne Pander, MD 10/11/18 [phone]

## 2018-10-12 ENCOUNTER — Encounter (HOSPITAL_COMMUNITY): Payer: Self-pay | Admitting: Cardiology

## 2018-10-12 ENCOUNTER — Other Ambulatory Visit: Payer: Self-pay

## 2018-10-12 ENCOUNTER — Telehealth: Payer: Self-pay | Admitting: Pulmonary Disease

## 2018-10-12 DIAGNOSIS — E871 Hypo-osmolality and hyponatremia: Secondary | ICD-10-CM | POA: Diagnosis present

## 2018-10-12 DIAGNOSIS — I10 Essential (primary) hypertension: Secondary | ICD-10-CM | POA: Diagnosis present

## 2018-10-12 DIAGNOSIS — I4891 Unspecified atrial fibrillation: Secondary | ICD-10-CM | POA: Diagnosis present

## 2018-10-12 DIAGNOSIS — I4892 Unspecified atrial flutter: Principal | ICD-10-CM

## 2018-10-12 DIAGNOSIS — J984 Other disorders of lung: Secondary | ICD-10-CM

## 2018-10-12 DIAGNOSIS — Z23 Encounter for immunization: Secondary | ICD-10-CM | POA: Diagnosis present

## 2018-10-12 DIAGNOSIS — J441 Chronic obstructive pulmonary disease with (acute) exacerbation: Secondary | ICD-10-CM

## 2018-10-12 DIAGNOSIS — D696 Thrombocytopenia, unspecified: Secondary | ICD-10-CM | POA: Diagnosis present

## 2018-10-12 DIAGNOSIS — S0121XA Laceration without foreign body of nose, initial encounter: Secondary | ICD-10-CM | POA: Diagnosis present

## 2018-10-12 DIAGNOSIS — E785 Hyperlipidemia, unspecified: Secondary | ICD-10-CM | POA: Diagnosis present

## 2018-10-12 DIAGNOSIS — J690 Pneumonitis due to inhalation of food and vomit: Secondary | ICD-10-CM | POA: Diagnosis present

## 2018-10-12 DIAGNOSIS — F4310 Post-traumatic stress disorder, unspecified: Secondary | ICD-10-CM | POA: Diagnosis present

## 2018-10-12 DIAGNOSIS — W1830XA Fall on same level, unspecified, initial encounter: Secondary | ICD-10-CM | POA: Diagnosis present

## 2018-10-12 DIAGNOSIS — R911 Solitary pulmonary nodule: Secondary | ICD-10-CM | POA: Diagnosis present

## 2018-10-12 DIAGNOSIS — S022XXA Fracture of nasal bones, initial encounter for closed fracture: Secondary | ICD-10-CM | POA: Diagnosis present

## 2018-10-12 DIAGNOSIS — Z7984 Long term (current) use of oral hypoglycemic drugs: Secondary | ICD-10-CM | POA: Diagnosis not present

## 2018-10-12 DIAGNOSIS — F3181 Bipolar II disorder: Secondary | ICD-10-CM | POA: Diagnosis present

## 2018-10-12 DIAGNOSIS — E119 Type 2 diabetes mellitus without complications: Secondary | ICD-10-CM | POA: Diagnosis present

## 2018-10-12 DIAGNOSIS — Y92009 Unspecified place in unspecified non-institutional (private) residence as the place of occurrence of the external cause: Secondary | ICD-10-CM | POA: Diagnosis not present

## 2018-10-12 DIAGNOSIS — J9601 Acute respiratory failure with hypoxia: Secondary | ICD-10-CM | POA: Diagnosis present

## 2018-10-12 DIAGNOSIS — E7800 Pure hypercholesterolemia, unspecified: Secondary | ICD-10-CM | POA: Diagnosis present

## 2018-10-12 DIAGNOSIS — F1010 Alcohol abuse, uncomplicated: Secondary | ICD-10-CM

## 2018-10-12 DIAGNOSIS — R9431 Abnormal electrocardiogram [ECG] [EKG]: Secondary | ICD-10-CM

## 2018-10-12 DIAGNOSIS — Z7951 Long term (current) use of inhaled steroids: Secondary | ICD-10-CM | POA: Diagnosis not present

## 2018-10-12 DIAGNOSIS — Y901 Blood alcohol level of 20-39 mg/100 ml: Secondary | ICD-10-CM | POA: Diagnosis present

## 2018-10-12 DIAGNOSIS — F1020 Alcohol dependence, uncomplicated: Secondary | ICD-10-CM | POA: Diagnosis present

## 2018-10-12 DIAGNOSIS — F209 Schizophrenia, unspecified: Secondary | ICD-10-CM | POA: Diagnosis present

## 2018-10-12 DIAGNOSIS — D649 Anemia, unspecified: Secondary | ICD-10-CM | POA: Diagnosis present

## 2018-10-12 DIAGNOSIS — F17210 Nicotine dependence, cigarettes, uncomplicated: Secondary | ICD-10-CM | POA: Diagnosis present

## 2018-10-12 LAB — CBC WITH DIFFERENTIAL/PLATELET
ABS IMMATURE GRANULOCYTES: 0.03 10*3/uL (ref 0.00–0.07)
Basophils Absolute: 0 10*3/uL (ref 0.0–0.1)
Basophils Relative: 0 %
Eosinophils Absolute: 0 10*3/uL (ref 0.0–0.5)
Eosinophils Relative: 0 %
HCT: 36.2 % — ABNORMAL LOW (ref 39.0–52.0)
Hemoglobin: 12.8 g/dL — ABNORMAL LOW (ref 13.0–17.0)
Immature Granulocytes: 1 %
Lymphocytes Relative: 12 %
Lymphs Abs: 0.6 10*3/uL — ABNORMAL LOW (ref 0.7–4.0)
MCH: 35.3 pg — ABNORMAL HIGH (ref 26.0–34.0)
MCHC: 35.4 g/dL (ref 30.0–36.0)
MCV: 99.7 fL (ref 80.0–100.0)
MONOS PCT: 13 %
Monocytes Absolute: 0.6 10*3/uL (ref 0.1–1.0)
Neutro Abs: 3.5 10*3/uL (ref 1.7–7.7)
Neutrophils Relative %: 74 %
Platelets: 138 10*3/uL — ABNORMAL LOW (ref 150–400)
RBC: 3.63 MIL/uL — ABNORMAL LOW (ref 4.22–5.81)
RDW: 12.5 % (ref 11.5–15.5)
WBC: 4.8 10*3/uL (ref 4.0–10.5)
nRBC: 0 % (ref 0.0–0.2)

## 2018-10-12 LAB — TSH: TSH: 3.96 u[IU]/mL (ref 0.350–4.500)

## 2018-10-12 LAB — BASIC METABOLIC PANEL
Anion gap: 11 (ref 5–15)
BUN: 12 mg/dL (ref 8–23)
CO2: 25 mmol/L (ref 22–32)
Calcium: 7.7 mg/dL — ABNORMAL LOW (ref 8.9–10.3)
Chloride: 99 mmol/L (ref 98–111)
Creatinine, Ser: 1.06 mg/dL (ref 0.61–1.24)
GFR calc Af Amer: >60 mL/min (ref >60)
GFR calc non Af Amer: >60 mL/min (ref >60)
Glucose, Bld: 150 mg/dL — ABNORMAL HIGH (ref 70–99)
Potassium: 3.9 mmol/L (ref 3.5–5.1)
Sodium: 135 mmol/L (ref 135–145)

## 2018-10-12 LAB — GLUCOSE, CAPILLARY
GLUCOSE-CAPILLARY: 177 mg/dL — AB (ref 70–99)
Glucose-Capillary: 125 mg/dL — ABNORMAL HIGH (ref 70–99)
Glucose-Capillary: 156 mg/dL — ABNORMAL HIGH (ref 70–99)
Glucose-Capillary: 136 mg/dL — ABNORMAL HIGH (ref 70–99)

## 2018-10-12 LAB — URINALYSIS, ROUTINE W REFLEX MICROSCOPIC
BILIRUBIN URINE: NEGATIVE
Bacteria, UA: NONE SEEN
Glucose, UA: 50 mg/dL — AB
Ketones, ur: 20 mg/dL — AB
Leukocytes, UA: NEGATIVE
NITRITE: NEGATIVE
Protein, ur: NEGATIVE mg/dL
SPECIFIC GRAVITY, URINE: 1.032 — AB (ref 1.005–1.030)
pH: 5 (ref 5.0–8.0)

## 2018-10-12 LAB — RAPID URINE DRUG SCREEN, HOSP PERFORMED
AMPHETAMINES: NOT DETECTED
Barbiturates: NOT DETECTED
Benzodiazepines: POSITIVE — AB
Cocaine: NOT DETECTED
Opiates: POSITIVE — AB
Tetrahydrocannabinol: NOT DETECTED

## 2018-10-12 LAB — MAGNESIUM
Magnesium: 1.2 mg/dL — ABNORMAL LOW (ref 1.7–2.4)
Magnesium: 1.1 mg/dL — ABNORMAL LOW (ref 1.7–2.4)

## 2018-10-12 LAB — PROCALCITONIN: Procalcitonin: <0.1 ng/mL

## 2018-10-12 LAB — TROPONIN I
Troponin I: <0.03 ng/mL (ref <0.03)
Troponin I: <0.03 ng/mL (ref <0.03)
Troponin I: <0.03 ng/mL (ref <0.03)

## 2018-10-12 LAB — HEPATIC FUNCTION PANEL
ALT: 43 U/L (ref 0–44)
AST: 55 U/L — AB (ref 15–41)
Albumin: 2.8 g/dL — ABNORMAL LOW (ref 3.5–5.0)
Alkaline Phosphatase: 49 U/L (ref 38–126)
BILIRUBIN INDIRECT: 1.3 mg/dL — AB (ref 0.3–0.9)
Bilirubin, Direct: 0.7 mg/dL — ABNORMAL HIGH (ref 0.0–0.2)
Total Bilirubin: 2 mg/dL — ABNORMAL HIGH (ref 0.3–1.2)
Total Protein: 5.3 g/dL — ABNORMAL LOW (ref 6.5–8.1)

## 2018-10-12 MED ORDER — SODIUM CHLORIDE 0.9 % IV SOLN
1.5000 g | Freq: Four times a day (QID) | INTRAVENOUS | Status: DC
  Administered 2018-10-12 – 2018-10-18 (×23): 1.5 g via INTRAVENOUS
  Filled 2018-10-12 (×28): qty 1.5

## 2018-10-12 MED ORDER — MAGNESIUM SULFATE 2 GM/50ML IV SOLN
2.0000 g | Freq: Once | INTRAVENOUS | Status: AC
  Administered 2018-10-13: 2 g via INTRAVENOUS
  Filled 2018-10-12: qty 50

## 2018-10-12 MED ORDER — METOPROLOL TARTRATE 12.5 MG HALF TABLET
12.5000 mg | ORAL_TABLET | Freq: Two times a day (BID) | ORAL | Status: DC
  Administered 2018-10-12 (×2): 12.5 mg via ORAL
  Filled 2018-10-12 (×3): qty 1

## 2018-10-12 MED ORDER — INFLUENZA VAC SPLIT HIGH-DOSE 0.5 ML IM SUSY
0.5000 mL | PREFILLED_SYRINGE | INTRAMUSCULAR | Status: AC
  Administered 2018-10-13: 0.5 mL via INTRAMUSCULAR
  Filled 2018-10-12: qty 0.5

## 2018-10-12 MED ORDER — MAGNESIUM SULFATE 2 GM/50ML IV SOLN
2.0000 g | Freq: Once | INTRAVENOUS | Status: AC
  Administered 2018-10-12: 2 g via INTRAVENOUS
  Filled 2018-10-12: qty 50

## 2018-10-12 MED ORDER — LORAZEPAM 1 MG PO TABS
1.0000 mg | ORAL_TABLET | Freq: Two times a day (BID) | ORAL | Status: DC
  Administered 2018-10-12 – 2018-10-19 (×15): 1 mg via ORAL
  Filled 2018-10-12 (×2): qty 1
  Filled 2018-10-12: qty 2
  Filled 2018-10-12 (×12): qty 1

## 2018-10-12 MED ORDER — LEVALBUTEROL HCL 0.63 MG/3ML IN NEBU
0.6300 mg | INHALATION_SOLUTION | Freq: Three times a day (TID) | RESPIRATORY_TRACT | Status: DC
  Administered 2018-10-12 (×3): 0.63 mg via RESPIRATORY_TRACT
  Filled 2018-10-12 (×3): qty 3

## 2018-10-12 NOTE — Telephone Encounter (Signed)
Patient will need repeat CT scan of chest prior to appt with Buelah Manis, NP on 2/21.  Can we please work on getting him set up once he is discharged?  Please let me know if I can assist in any way.   Canary Brim, NP-C [facility] Pgr: [phone] or if no answer [phone] 10/12/2018, 12:41 PM

## 2018-10-12 NOTE — Progress Notes (Addendum)
PROGRESS NOTE    Albert Hess  WUJ:[id number]  DOB: Dec 31, 1945  DOA: 10/11/2018 PCP: Wilfrid Lund, PA  Brief Narrative:   73 year old male with history of hypertension, diabetes mellitus, heavy alcohol use, Schizophrenia, bipolar disorder, PTSD who lives alone presented after a fall in the setting of alcohol use/intoxication.  Patient noted to have  facial skin abrasions, periorbital hematoma and nasal bone fracture during the ED work-up.  On arrival patient was also in a flutter with RVR.  Chest x-ray reported interval increase in size of an air cyst/pneumatocele in the mid RIGHT lung, now 6.6 x 6.9 cm.  So underwent CT angios chest to rule out PE which was negative for pulmonary embolus but reported (in addition to slowly growing right lower lobe cavity with air-fluid level) subtle bilateral patchy hazy airspace process worse over the left lower lobe suggestive of acute infectious or inflammatory process.  Patient admitted with empiric antibiotics and cardiology consulted for new onset a flutter.  Patient was supposed to follow-up pulmonary as outpatient but he never did.  He reports living alone and daughter lives about 2-1/2 hours away.  He denies being in alcohol rehab program in the past.  Denies history of alcohol withdrawals or hospitalizations requiring ICU stay or alcohol related seizures.  He does not recollect the events leading to the fall.  Subjective:  He denies any acute complaints except for pain at the nasal fracture site, he appears calm.  No signs of withdrawal.  Objective: Vitals:   10/12/18 1201 10/12/18 1403 10/12/18 1419 10/12/18 1620  BP: 139/84  115/63 124/62  Pulse: (!) 109  (!) 111 83  Resp: (!) 23   (!) 25  Temp: 98.7 F (37.1 C)   98.3 F (36.8 C)  TempSrc: Oral   Axillary  SpO2: 96%   96%  Weight:      Height:  6' (1.829 m)      Intake/Output Summary (Last 24 hours) at 10/12/2018 1749 Last data filed at 10/12/2018 0657 Gross per 24 hour  Intake 952.9  ml  Output 250 ml  Net 702.9 ml   Filed Weights   10/12/18 0419  Weight: 109.9 kg    Physical Examination:  General exam: Appears calm and comfortable .  Extensive bruising on the face with right-sided periorbital edema/laceration and Steri-Strip on the nose Respiratory system: Clear to auscultation. Respiratory effort normal. Cardiovascular system: S1 & S2 heard, irregukar. No JVD, murmurs, rubs, gallops or clicks. Some pedal edema. Gastrointestinal system: Abdomen is nondistended, soft and nontender. No organomegaly or masses felt. Normal bowel sounds heard. Central nervous system: Alert and oriented. No focal neurological deficits. Extremities: Symmetric 5 x 5 power. 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 Mood & affect appropriate.     Data Reviewed: I have personally reviewed following labs and imaging studies  CBC: Recent Labs  Lab 10/11/18 1551 10/12/18 0427  WBC 5.3 4.8  NEUTROABS 3.9 3.5  HGB 16.8 12.8*  HCT 50.0 36.2*  MCV 102.9* 99.7  PLT PLATELET CLUMPS NOTED ON SMEAR, UNABLE TO ESTIMATE 138*   Basic Metabolic Panel: Recent Labs  Lab 10/11/18 1552 10/11/18 2223 10/12/18 0427 10/12/18 1326  NA 134*  --  135  --   K 4.5  --  3.9  --   CL 94*  --  99  --   CO2 20*  --  25  --   GLUCOSE 72  --  150*  --   BUN 12  --  12  --   CREATININE 0.94  --  1.06  --   CALCIUM 9.0  --  7.7*  --   MG  --  1.2*  --  1.1*   GFR: Estimated Creatinine Clearance: 81.8 mL/min (by C-G formula based on SCr of 1.06 mg/dL). Liver Function Tests: Recent Labs  Lab 10/11/18 1552 10/12/18 0427  AST 93* 55*  ALT 60* 43  ALKPHOS 62 49  BILITOT 3.1* 2.0*  PROT 6.8 5.3*  ALBUMIN 3.7 2.8*   No results for input(s): LIPASE, AMYLASE in the last 168 hours. No results for input(s): AMMONIA in the last 168 hours. Coagulation Profile: No results for input(s): INR, PROTIME in  the last 168 hours. Cardiac Enzymes: Recent Labs  Lab 10/11/18 1552 10/11/18 2223 10/12/18 0427 10/12/18 1027  CKTOTAL 287  --   --   --   TROPONINI  --  <0.03 <0.03 <0.03   BNP (last 3 results) No results for input(s): PROBNP in the last 8760 hours. HbA1C: No results for input(s): HGBA1C in the last 72 hours. CBG: Recent Labs  Lab 10/11/18 2225 10/12/18 0740 10/12/18 1158 10/12/18 1618  GLUCAP 116* 125* 156* 177*   Lipid Profile: No results for input(s): CHOL, HDL, LDLCALC, TRIG, CHOLHDL, LDLDIRECT in the last 72 hours. Thyroid Function Tests: Recent Labs    10/11/18 2223  TSH 3.960   Anemia Panel: No results for input(s): VITAMINB12, FOLATE, FERRITIN, TIBC, IRON, RETICCTPCT in the last 72 hours. Sepsis Labs: No results for input(s): PROCALCITON, LATICACIDVEN in the last 168 hours.  No results found for this or any previous visit (from the past 240 hour(s)).    Radiology Studies: Ct Head Wo Contrast  Result Date: 10/11/2018 CLINICAL DATA:  Neck and back pain and nasal aspiration following a fall today while intoxicated. EXAM: CT HEAD WITHOUT CONTRAST CT MAXILLOFACIAL WITHOUT CONTRAST CT CERVICAL SPINE WITHOUT CONTRAST TECHNIQUE: Multidetector CT imaging of the head, cervical spine, and maxillofacial structures were performed using the standard protocol without intravenous contrast. Multiplanar CT image reconstructions of the cervical spine and maxillofacial structures were also generated. COMPARISON:  Head and cervical spine CT dated 03/16/2013. FINDINGS: CT HEAD FINDINGS Brain: Mild-to-moderate enlargement of the ventricles and subarachnoid spaces with mild progression. Mild to moderate patchy white matter low density in both cerebral hemispheres with mild progression. No intracranial hemorrhage, mass lesion or CT evidence of acute infarction. Vascular: No hyperdense vessel or unexpected calcification. Skull: Normal. Negative for fracture or focal lesion. Other: None. CT  MAXILLOFACIAL FINDINGS Osseous: Interval fracture of the mid nasal septum with angulation and mild displacement of the anterior portion to the left. Small fracture of the inferior aspect of the distal nasal bone a mild inferiorly and posteriorly displaced fragment. Orbits: Intact. Sinuses: Moderate right inferior maxillary sinus mucosal thickening. Mild left maxillary sinus mucosal thickening with a small amount of fluid in the sinus. Soft tissues: Anterior nasal soft tissue irregularity superiorly with multiple small locules of air within the underlying soft tissues. CT CERVICAL SPINE FINDINGS Alignment: Mild reversal of the normal cervical lordosis. No subluxations. Skull base and vertebrae: No acute fracture. No primary bone lesion or focal pathologic process. Soft tissues and spinal canal: No prevertebral fluid or swelling. No visible canal hematoma. Disc levels: Multilevel degenerative changes, most pronounced at the C5-6 and C6-7 levels. Upper chest: Clear lung apices. Other: Bilateral carotid artery calcifications. IMPRESSION: 1. Interval fracture of the mid nasal septum with  angulation and mild displacement of the anterior portion to the left. 2. No skull fracture or intracranial hemorrhage. 3. No cervical spine fracture or subluxation. 4. Mildly progressive atrophy and chronic small vessel white matter ischemic changes in both cerebral hemispheres. 5. Cervical spine degenerative changes. 6. Bilateral carotid artery atheromatous calcifications. 7. Chronic bilateral maxillary sinusitis with an acute component or blood in the left maxillary sinus. Electronically Signed   By: Beckie SaltsSteven  Reid M.D.   On: 10/11/2018 16:27   Ct Angio Chest Pe W And/or Wo Contrast  Result Date: 10/11/2018 CLINICAL DATA:  Chest pain possible pulmonary embolism. EXAM: CT ANGIOGRAPHY CHEST WITH CONTRAST TECHNIQUE: Multidetector CT imaging of the chest was performed using the standard protocol during bolus administration of intravenous  contrast. Multiplanar CT image reconstructions and MIPs were obtained to evaluate the vascular anatomy. CONTRAST:  100mL ISOVUE-370 IOPAMIDOL (ISOVUE-370) INJECTION 76% COMPARISON:  Chest x-ray 10/11/2018 and 10/16/2016 FINDINGS: Cardiovascular: Heart is normal size. Calcified plaque over the left main and 3 vessel coronary arteries. Minimal calcified plaque over the thoracic aorta. Thoracic aorta is otherwise normal in caliber without evidence of dissection. Pulmonary arterial system is well opacified without evidence of emboli. Mediastinum/Nodes: No mediastinal or hilar adenopathy. Remaining mediastinal structures are normal. Lungs/Pleura: Lungs are adequately inflated with subtle hazy patchy airspace density bilaterally worse over the left lower lobe likely due to an infectious or inflammatory process. No effusion. Known chronic thin-walled cavitary lesion with small air-fluid level over the right lower lobe measuring 6.1 x 6.2 cm in AP and transverse dimension. This continues to slowly increase in size. There is no evidence of mural nodularity or focal wall thickening. 2 mm subpleural nodule over the lateral right upper lobe. Airways are otherwise unremarkable. Upper Abdomen: Mild diffuse low-attenuation of the liver. Minimal calcified plaque over the abdominal aorta. Mild cholelithiasis. Musculoskeletal: Degenerative change of the spine. Review of the MIP images confirms the above findings. IMPRESSION: No evidence of pulmonary embolism. Subtle bilateral patchy hazy airspace process worse over the left lower lobe likely an acute infectious or inflammatory process. Chronic slowly increase in size of a thin-walled cavitary lesion over the right lower lobe with small air-fluid level. No suspicious wall thickening or mural nodularity. Aortic Atherosclerosis (ICD10-I70.0). Atherosclerotic coronary artery disease. Cholelithiasis. Mild hepatic steatosis. Electronically Signed   By: Elberta Fortisaniel  Boyle M.D.   On: 10/11/2018  19:11   Ct Cervical Spine Wo Contrast  Result Date: 10/11/2018 CLINICAL DATA:  Neck and back pain and nasal aspiration following a fall today while intoxicated. EXAM: CT HEAD WITHOUT CONTRAST CT MAXILLOFACIAL WITHOUT CONTRAST CT CERVICAL SPINE WITHOUT CONTRAST TECHNIQUE: Multidetector CT imaging of the head, cervical spine, and maxillofacial structures were performed using the standard protocol without intravenous contrast. Multiplanar CT image reconstructions of the cervical spine and maxillofacial structures were also generated. COMPARISON:  Head and cervical spine CT dated 03/16/2013. FINDINGS: CT HEAD FINDINGS Brain: Mild-to-moderate enlargement of the ventricles and subarachnoid spaces with mild progression. Mild to moderate patchy white matter low density in both cerebral hemispheres with mild progression. No intracranial hemorrhage, mass lesion or CT evidence of acute infarction. Vascular: No hyperdense vessel or unexpected calcification. Skull: Normal. Negative for fracture or focal lesion. Other: None. CT MAXILLOFACIAL FINDINGS Osseous: Interval fracture of the mid nasal septum with angulation and mild displacement of the anterior portion to the left. Small fracture of the inferior aspect of the distal nasal bone a mild inferiorly and posteriorly displaced fragment. Orbits: Intact. Sinuses: Moderate right inferior maxillary sinus  mucosal thickening. Mild left maxillary sinus mucosal thickening with a small amount of fluid in the sinus. Soft tissues: Anterior nasal soft tissue irregularity superiorly with multiple small locules of air within the underlying soft tissues. CT CERVICAL SPINE FINDINGS Alignment: Mild reversal of the normal cervical lordosis. No subluxations. Skull base and vertebrae: No acute fracture. No primary bone lesion or focal pathologic process. Soft tissues and spinal canal: No prevertebral fluid or swelling. No visible canal hematoma. Disc levels: Multilevel degenerative changes,  most pronounced at the C5-6 and C6-7 levels. Upper chest: Clear lung apices. Other: Bilateral carotid artery calcifications. IMPRESSION: 1. Interval fracture of the mid nasal septum with angulation and mild displacement of the anterior portion to the left. 2. No skull fracture or intracranial hemorrhage. 3. No cervical spine fracture or subluxation. 4. Mildly progressive atrophy and chronic small vessel white matter ischemic changes in both cerebral hemispheres. 5. Cervical spine degenerative changes. 6. Bilateral carotid artery atheromatous calcifications. 7. Chronic bilateral maxillary sinusitis with an acute component or blood in the left maxillary sinus. Electronically Signed   By: Beckie Salts M.D.   On: 10/11/2018 16:27   Dg Pelvis Portable  Result Date: 10/11/2018 CLINICAL DATA:  Larey Seat in home today EXAM: PORTABLE PELVIS 1-2 VIEWS COMPARISON:  Portable exam 1506 hours without priors for comparison FINDINGS: Osseous mineralization normal. Hip and SI joint spaces preserved. No acute fracture, dislocation, or bone destruction. IMPRESSION: No acute osseous abnormalities. Electronically Signed   By: Ulyses Southward M.D.   On: 10/11/2018 15:24   Dg Chest Portable 1 View  Result Date: 10/11/2018 CLINICAL DATA:  Larey Seat at home today, history hypertension, diabetes mellitus, COPD, smoker EXAM: PORTABLE CHEST 1 VIEW COMPARISON:  Portable exam 1506 hours compared to 10/16/2016 FINDINGS: Normal heart size, mediastinal contours, and pulmonary vascularity. 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 Lungs clear. No infiltrate, pleural effusion or pneumothorax. No acute osseous findings. IMPRESSION: Interval increase in size of an air cyst/pneumatocele in the mid RIGHT lung now 6.6 x 6.9 cm. Otherwise negative exam. Electronically Signed   By: Ulyses Southward M.D.   On: 10/11/2018 15:23   Ct Maxillofacial Wo Contrast  Result Date: 10/11/2018 CLINICAL DATA:  Neck and back  pain and nasal aspiration following a fall today while intoxicated. EXAM: CT HEAD WITHOUT CONTRAST CT MAXILLOFACIAL WITHOUT CONTRAST CT CERVICAL SPINE WITHOUT CONTRAST TECHNIQUE: Multidetector CT imaging of the head, cervical spine, and maxillofacial structures were performed using the standard protocol without intravenous contrast. Multiplanar CT image reconstructions of the cervical spine and maxillofacial structures were also generated. COMPARISON:  Head and cervical spine CT dated 03/16/2013. FINDINGS: CT HEAD FINDINGS Brain: Mild-to-moderate enlargement of the ventricles and subarachnoid spaces with mild progression. Mild to moderate patchy white matter low density in both cerebral hemispheres with mild progression. No intracranial hemorrhage, mass lesion or CT evidence of acute infarction. Vascular: No hyperdense vessel or unexpected calcification. Skull: Normal. Negative for fracture or focal lesion. Other: None. CT MAXILLOFACIAL FINDINGS Osseous: Interval fracture of the mid nasal septum with angulation and mild displacement of the anterior portion to the left. Small fracture of the inferior aspect of the distal nasal bone a mild inferiorly and posteriorly displaced fragment. Orbits: Intact. Sinuses: Moderate right inferior maxillary sinus mucosal thickening. Mild left maxillary sinus mucosal thickening with a small amount of fluid in the sinus. Soft tissues: Anterior nasal soft tissue irregularity superiorly with multiple small locules of air within the  underlying soft tissues. CT CERVICAL SPINE FINDINGS Alignment: Mild reversal of the normal cervical lordosis. No subluxations. Skull base and vertebrae: No acute fracture. No primary bone lesion or focal pathologic process. Soft tissues and spinal canal: No prevertebral fluid or swelling. No visible canal hematoma. Disc levels: Multilevel degenerative changes, most pronounced at the C5-6 and C6-7 levels. Upper chest: Clear lung apices. Other: Bilateral  carotid artery calcifications. IMPRESSION: 1. Interval fracture of the mid nasal septum with angulation and mild displacement of the anterior portion to the left. 2. No skull fracture or intracranial hemorrhage. 3. No cervical spine fracture or subluxation. 4. Mildly progressive atrophy and chronic small vessel white matter ischemic changes in both cerebral hemispheres. 5. Cervical spine degenerative changes. 6. Bilateral carotid artery atheromatous calcifications. 7. Chronic bilateral maxillary sinusitis with an acute component or blood in the left maxillary sinus. Electronically Signed   By: Beckie SaltsSteven  Reid M.D.   On: 10/11/2018 16:27        Scheduled Meds: . folic acid  1 mg Intravenous Daily  . [START ON 10/13/2018] Influenza vac split quadrivalent PF  0.5 mL Intramuscular Tomorrow-1000  . insulin aspart  0-9 Units Subcutaneous TID WC  . levalbuterol  0.63 mg Nebulization TID  . LORazepam  1 mg Oral BID  . metoprolol tartrate  12.5 mg Oral BID  . thiamine injection  100 mg Intravenous Daily   Continuous Infusions: . sodium chloride 125 mL/hr at 10/11/18 2319  . azithromycin    . cefTRIAXone (ROCEPHIN)  IV    . diltiazem (CARDIZEM) infusion 15 mg/hr (10/12/18 0251)    Assessment & Plan:    1.  Atrial flutter with RVR: Likely precipitated by heavy alcohol use.  Appreciate cardiology evaluation/input.  Agree patient not a candidate for chronic anticoagulation given ongoing heavy alcohol use/fall risk/GI bleed risk.  Beta-blockers added to Cardizem drip.  Taper as tolerated.  Echo ordered.  Monitor electrolytes and replace as needed.  Repeat EKG to follow-up on QT interval (514 ms on admission)  2.  Patchy infiltrates and chronic right cavitary lung lesion: Seen by pulmonary.  DC Rocephin/azithromycin and start Unasyn for aspiration coverage.Likely partially treated lung abscess in remote past. Pulmonary does not recommend bronc and recommended outpatient follow-up for repeat CT in 3 months.     3.  Syncope/fall: Likely secondary to problem #1 and problem #4.  Trauma work-up negative except for nasal fracture.  Supportive management.  CT chest negative for PE.  Echo pending.  Prolonged QTC on admission EKG.  Replace magnesium.  Avoid QT prolonging agents.  DC azithromycin. Received one dose Levaquin in ED.   4.  Alcohol dependence: Patient denies prior history of alcohol withdrawal.  Will discuss with daughter.  Drinks half gallon of vodka daily.  Continue CIWA protocol/IV thiamine.  Replace magnesium  5.  Diabetes mellitus type 2: On metformin at home.  Currently on sliding scale coverage.  Diabetic diet  6.  PTSD/bipolar disorder/schizophrenia:?  Not on any medications    DVT prophylaxis: SCD Code Status: Full code Family / Patient Communication: Discussed with daughter Disposition Plan: To be determined.  Daughter requesting placement.  Doubt if patient will agree.  Case management consulted     LOS: 0 days    Time spent: 35 minutes    Alessandra BevelsNeelima Collins Kerby, MD [facility] Pager [phone]  If 7PM-7AM, please contact night-coverage [email or web address] Password [email or web address] 10/12/2018, 5:49 PM

## 2018-10-12 NOTE — Consult Note (Addendum)
NAME:  Albert GoldsmithMark M Hess, MRN:  [id number], DOB:  1946/07/14, LOS: 0 ADMISSION DATE:  10/11/2018, CONSULTATION DATE: 1/24 REFERRING MD:  Dr. Lajuana RippleKamineni, CHIEF COMPLAINT:  Cavitary lesion    Brief History   73 year old male admitted 1/23 after mechanical fall in the setting of EtOH.  Found to have enlargement of known right cavitary lesion.  History of present illness   73 year old male admitted 1/23 after being found at his home altered and evidence of mechanical fall.  Patient reports his daughter just happened to be in the area visiting and had to break the door down to get into his home.  He states that he typically drinks fairly heavily with a drink of choice of vodka.  He started drinking during the [place] War and has drank heavily for the past 15 years.  Reports that he does get withdrawal symptoms when he does not drink alcohol.  States that he often drinks to the point of passing out.    Initial head/neck imaging revealed fracture of the mid nasal septum and mild displacement of the anterior portion to the left, no skull fracture or ICH, no cervical spine fracture, mildly progressive atrophy and chronic small vessel white matter ischemic changes, bilateral carotid artery atheromatous calcifications once.  CTA of the chest was assessed and negative for PE, demonstrated subtle bilateral patchy hazy airspace disease concerning for acute infectious or inflammatory process, chronic slowly increased size of a thin-walled cavitary lesion in the right lower lobe with small air-fluid level.  No suspicious wall thickening or mural nodularity, mild hepatic steatosis.  PCCM consulted for evaluation of abnormal CT chest.  Pt denies cough, sputum production, foul smelling sputum, fevers, chills.   Past Medical History  Schizophrenia  Bipolar disorder  ETOH abuse  PTSD HTN  HLD DM  COPD   Significant Hospital Events   1/23 Admit post fall, broken nose, incidental finding of enlarging cavitary lesion    Consults:  1/24 PCCM   Procedures:    Significant Diagnostic Tests:  CT Cervical Spine / Head / Maxillofacial 1/23 >> fracture of the mid nasal septum and mild displacement of the anterior portion to the left, no skull fracture or ICH, no cervical spine fracture, mildly progressive atrophy and chronic small vessel white matter ischemic changes, bilateral carotid artery atheromatous calcifications once. CTA Chest 1/23 >> negative for PE, demonstrated subtle bilateral patchy hazy airspace disease concerning for acute infectious or inflammatory process, chronic slowly increased size of a thin-walled cavitary lesion in the right lower lobe with small air-fluid level.  No suspicious wall thickening or mural nodularity, mild hepatic steatosis.  Micro Data:    Antimicrobials:  Ceftriaxone 1/23 >>  Rocephin 1/23 >>   Interim history/subjective:    Objective   Blood pressure 133/73, pulse (!) 114, temperature 98.1 F (36.7 C), temperature source Oral, resp. rate (!) 26, weight 109.9 kg, SpO2 96 %.        Intake/Output Summary (Last 24 hours) at 10/12/2018 1138 Last data filed at 10/12/2018 [id number] Gross per 24 hour  Intake 952.9 ml  Output 250 ml  Net 702.9 ml   Filed Weights   10/12/18 0419  Weight: 109.9 kg    Examination: General: adult male lying in bed  HEENT: MM pink/moist, dried blood on face, steri strips on bridge of nose Neuro: AAOx4, speech clear, MAE  CV: s1s2 rrr, tachy, no m/r/g PULM: even/non-labored, lungs bilaterally clear  FA:OZHYGI:soft, non-tender, bsx4 active  Extremities: warm/dry, no edema  Skin: areas  of depigmented skin on right chest wall, right shoulder, dry scaling skin on LE's   Resolved Hospital Problem list      Assessment & Plan:   Right Thin Walled Cavitary Lesion  -suspect in setting of chronic microaspiration / polymicrobial with ETOH abuse vs cyst / bullae but no significant background emphysema.  Non smoker.  Former occasional THC use.   P: Continue IV abx if procal is positive Transition to augmentin at discharge.  Will likely need 4-6 weeks abx with repeat CT non-contrast imaging to ensure resolution  Follow up in Pulmonary clinic as arranged (2/21 11 am with Buelah Manis, NP)   ETOH Abuse  At Risk Withdrawal  P: CIWA protocol per primary  Added 1mg  Ativan BID in addition to above, may need more to prevent withdrawal symptoms (note tachycardia)  Best practice:  Diet: as tolerated  Pain/Anxiety/Delirium protocol (if indicated): n/a  VAP protocol (if indicated): n/a  DVT prophylaxis: per primary  GI prophylaxis: n/a  Glucose control: n/a  Mobility: as tolerated  Code Status: full code  Family Communication: patient updated on plan of care Disposition: per [facility]  Labs   CBC: Recent Labs  Lab 10/11/18 1551 10/12/18 0427  WBC 5.3 4.8  NEUTROABS 3.9 3.5  HGB 16.8 12.8*  HCT 50.0 36.2*  MCV 102.9* 99.7  PLT PLATELET CLUMPS NOTED ON SMEAR, UNABLE TO ESTIMATE 138*    Basic Metabolic Panel: Recent Labs  Lab 10/11/18 1552 10/11/18 2223 10/12/18 0427  NA 134*  --  135  K 4.5  --  3.9  CL 94*  --  99  CO2 20*  --  25  GLUCOSE 72  --  150*  BUN 12  --  12  CREATININE 0.94  --  1.06  CALCIUM 9.0  --  7.7*  MG  --  1.2*  --    GFR: CrCl cannot be calculated (Unknown ideal weight.). Recent Labs  Lab 10/11/18 1551 10/12/18 0427  WBC 5.3 4.8    Liver Function Tests: Recent Labs  Lab 10/11/18 1552 10/12/18 0427  AST 93* 55*  ALT 60* 43  ALKPHOS 62 49  BILITOT 3.1* 2.0*  PROT 6.8 5.3*  ALBUMIN 3.7 2.8*   No results for input(s): LIPASE, AMYLASE in the last 168 hours. No results for input(s): AMMONIA in the last 168 hours.  ABG    Component Value Date/Time   TCO2 26 03/16/2013 0215     Coagulation Profile: No results for input(s): INR, PROTIME in the last 168 hours.  Cardiac Enzymes: Recent Labs  Lab 10/11/18 1552 10/11/18 2223 10/12/18 0427  CKTOTAL 287  --   --   TROPONINI  --   <0.03 <0.03    HbA1C: Hgb A1c MFr Bld  Date/Time Value Ref Range Status  12/24/2012 01:36 PM 5.7 (H) <5.7 % Final    Comment:    (NOTE)                                                                       According to the ADA Clinical Practice Recommendations for 2011, when HbA1c is used as a screening test:  >=6.5%   Diagnostic of Diabetes Mellitus           (if  abnormal result is confirmed) 5.7-6.4%   Increased risk of developing Diabetes Mellitus References:Diagnosis and Classification of Diabetes Mellitus,Diabetes Care,2011,34(Suppl 1):S62-S69 and Standards of Medical Care in         Diabetes - 2011,Diabetes Care,2011,34 (Suppl 1):S11-S61.    CBG: Recent Labs  Lab 10/11/18 2225 10/12/18 0740  GLUCAP 116* 125*    Review of Systems: Positives in [place]   Gen: Denies fever, chills, weight change, fatigue, night sweats HEENT: Denies blurred vision, double vision, hearing loss, tinnitus, sinus congestion, rhinorrhea, sore throat, neck stiffness, dysphagia PULM: Denies shortness of breath, cough, sputum production, hemoptysis, wheezing CV: Denies chest pain, edema, orthopnea, paroxysmal nocturnal dyspnea, palpitations GI: Denies abdominal pain, nausea, vomiting, diarrhea, hematochezia, melena, constipation, change in bowel habits GU: Denies dysuria, hematuria, polyuria, oliguria, urethral discharge Endocrine: Denies hot or cold intolerance, polyuria, polyphagia or appetite change Derm: Denies rash, dry skin, scaling or peeling skin change Heme: Denies easy bruising, bleeding, bleeding gums Neuro: Denies headache, numbness, weakness, slurred speech, loss of memory or consciousness MSK:  Mechanical fall, facial pain   Past Medical History  He,  has a past medical history of Bipolar 2 disorder (HCC), COPD (chronic obstructive pulmonary disease) (HCC), Diabetes mellitus without complication (HCC), High cholesterol, Hypertension, Mental disorder, PTSD (post-traumatic stress  disorder), and Schizophrenia (HCC).   Surgical History    Past Surgical History:  Procedure Laterality Date  . right side     pt points to R side for surgery     Social History   reports that he has been smoking cigarettes. He has been smoking about 0.00 packs per day. He has never used smokeless tobacco. He reports current alcohol use. He reports current drug use. Drug: Marijuana.   Family History   His family history includes Suicidality in his father.   Allergies No Known Allergies   Home Medications  Prior to Admission medications   Medication Sig Start Date End Date Taking? Authorizing Provider  metFORMIN (GLUCOPHAGE) 500 MG tablet Take 1 tablet (500 mg total) by mouth 2 (two) times daily with a meal. 10/18/16  Yes Hongalgi, Maximino Greenland, MD  amLODipine (NORVASC) 5 MG tablet Take 1 tablet (5 mg total) by mouth daily. Patient not taking: Reported on 10/11/2018 10/19/16   Elease Etienne, MD  benzonatate (TESSALON) 200 MG capsule Take 1 capsule (200 mg total) by mouth 3 (three) times daily as needed for cough. Patient not taking: Reported on 10/11/2018 10/18/16   Elease Etienne, MD  fluticasone [facility]) 50 MCG/ACT nasal spray Place 2 sprays into both nostrils daily. Patient not taking: Reported on 10/11/2018 10/19/16   Elease Etienne, MD  folic acid (FOLVITE) 1 MG tablet Take 1 tablet (1 mg total) by mouth daily. Patient not taking: Reported on 10/11/2018 10/19/16   Elease Etienne, MD  Ipratropium-Albuterol (COMBIVENT RESPIMAT) 20-100 MCG/ACT AERS respimat Inhale 1 puff into the lungs every 6 (six) hours as needed for wheezing or shortness of breath. Patient not taking: Reported on 10/11/2018 10/18/16   Elease Etienne, MD  Multiple Vitamin (MULTIVITAMIN WITH MINERALS) TABS tablet Take 1 tablet by mouth daily. Patient not taking: Reported on 10/11/2018 10/19/16   Elease Etienne, MD  nicotine (NICODERM CQ - DOSED IN MG/24 HOURS) 21 mg/24hr patch Place 1 patch (21 mg total) onto  the skin daily. Patient not taking: Reported on 10/11/2018 10/19/16   Elease Etienne, MD  pravastatin (PRAVACHOL) 40 MG tablet Take 1 tablet (40 mg total) by mouth daily. Patient  not taking: Reported on 10/11/2018 10/18/16   Elease EtienneHongalgi, Anand D, MD  predniSONE (DELTASONE) 10 MG tablet Take 4 tabs daily for 3 days, then 3 tabs daily for 3 days, then 2 tabs daily for 3 days, then 1 tablet daily for 3 days, then stop. Patient not taking: Reported on 10/11/2018 10/18/16   Elease EtienneHongalgi, Anand D, MD  thiamine 100 MG tablet Take 1 tablet (100 mg total) by mouth daily. Patient not taking: Reported on 10/11/2018 10/19/16   Elease EtienneHongalgi, Anand D, MD     Critical care time: n/a    Canary BrimBrandi Ollis, NP-C [facility] Pgr: [phone] or if no answer [phone] 10/12/2018, 11:38 AM  Attending Note:  73 year old male with PMH of etoh abuse who presents to the hospital after a fall.  Patient has no complaints this AM from a respiratory standpoint.  On exam, lungs with decreased BS diffusely.  I reviewed chest CT myself, pulmonary cavity with air-fluid level noted, CXR from 6 years prior with no change.  Discussed with PCCM-NP.  Lung abscess from 6 years ago that likely healed and the cavity remains since patient likely did not have any or did not finish abx.    Cavitary lesion: do not believe is an acute infection and unlikely to be cancer  - No bronch for now  - F/U CT in 3 months with pulmonary follow up  Lung abscess:  - Check PCT  - If PCT is normal then d/c abc  - F/U on cultures   Etoh:  - Ativan  - Thiamine  - Folate  Aspiration pneumonia:  - PCT  - If negative D/C abx  - F/U on cultures  PCCM will sign off, please call back if needed.  Patient seen and examined, agree with above note.  I dictated the care and orders written for this patient under my direction.  Alyson ReedyYacoub, Wesam G, MD [phone]

## 2018-10-12 NOTE — Consult Note (Addendum)
Cardiology Consultation:   Patient ID: Albert Hess MRN: [id number]; DOB: July 27, 1946  Admit date: 10/11/2018 Date of Consult: 10/12/2018  Primary Care Provider: Wilfrid Lund, PA Primary Cardiologist: New Primary Electrophysiologist:  None   Patient Profile:   73 y.o. male w/ h/o heavy ETOH use, T2DM, HTN, HLD, bipolar disorder, Schizophrenia and PTSD brought to the ED after being found by family on the floor after a fall, sustained multiple lacerations, periorbital hematoma and nasal bone fracture, also found to be in atrial flutter w/ RVR and being treated for possible aspiration PNA. Cardiology consulted for atrial flutter, at the request of Dr. Lajuana Ripple, Internal Medicine.   History of Present Illness:   Albert Hess has no prior cardiac history. He has multiple cardiac risk factors including T2DM, HTN and HLD. He also has multiple psychiatric disorders including Bipolar disorder, Schizophrenia and PTSD. Also heavy ETOH use. It is outlined in his H&P that he drinks vodka every day. Pt reports to me that he goes through 1/2 galloon of vodka every 3 days.   He was brought into the ED after he was found by family members on the floor after a fall. Pt blames fall on being intoxicated. This has happened before. He sustained multiple lacerations and periorbital hematoma.  Alcohol level in the ED was 229. UDS + for Opiates and Benzos. Head CT in the ED showed nasal fracture. There was no skull fracture or intracranial hemorrhage and no cervical spine fracture of subluxation. Chest CT was negative for PE but showed evidence of atherosclerotic coronary artery disease. EKG showed atrial flutter w/ V-rates in the 140s. Labs showed normal WBC ct, normal H/H,  mild hyponatremia w/ Na level at 134. K was normal at 4.5. Mg low at 1.2 and supplementation was given, mag sulfate 2g IV. Scr normal at 0.94. Troponins negative x 2. TSH WNL.   Current rates are in the 120s. He is asymptomatic at rest. He gets SOB w/  exertion but no CP.    Past Medical History:  Diagnosis Date  . Alcohol abuse   . Atrial flutter (HCC)   . Bipolar 2 disorder (HCC)   . COPD (chronic obstructive pulmonary disease) (HCC)   . Diabetes mellitus without complication (HCC)   . High cholesterol   . Hypertension   . Obesity   . PTSD (post-traumatic stress disorder)   . Schizophrenia (HCC)     Past Surgical History:  Procedure Laterality Date  . right side     pt points to R side for surgery     Home Medications:  Prior to Admission medications   Medication Sig Start Date End Date Taking? Authorizing Provider  metFORMIN (GLUCOPHAGE) 500 MG tablet Take 1 tablet (500 mg total) by mouth 2 (two) times daily with a meal. 10/18/16  Yes Hongalgi, Maximino Greenland, MD  amLODipine (NORVASC) 5 MG tablet Take 1 tablet (5 mg total) by mouth daily. Patient not taking: Reported on 10/11/2018 10/19/16   Elease Etienne, MD  benzonatate (TESSALON) 200 MG capsule Take 1 capsule (200 mg total) by mouth 3 (three) times daily as needed for cough. Patient not taking: Reported on 10/11/2018 10/18/16   Elease Etienne, MD  fluticasone [facility]) 50 MCG/ACT nasal spray Place 2 sprays into both nostrils daily. Patient not taking: Reported on 10/11/2018 10/19/16   Elease Etienne, MD  folic acid (FOLVITE) 1 MG tablet Take 1 tablet (1 mg total) by mouth daily. Patient not taking: Reported on 10/11/2018 10/19/16  Hongalgi, Maximino Greenland, MD  Ipratropium-Albuterol (COMBIVENT RESPIMAT) 20-100 MCG/ACT AERS respimat Inhale 1 puff into the lungs every 6 (six) hours as needed for wheezing or shortness of breath. Patient not taking: Reported on 10/11/2018 10/18/16   Elease Etienne, MD  Multiple Vitamin (MULTIVITAMIN WITH MINERALS) TABS tablet Take 1 tablet by mouth daily. Patient not taking: Reported on 10/11/2018 10/19/16   Elease Etienne, MD  nicotine (NICODERM CQ - DOSED IN MG/24 HOURS) 21 mg/24hr patch Place 1 patch (21 mg total) onto the skin daily. Patient not  taking: Reported on 10/11/2018 10/19/16   Elease Etienne, MD  pravastatin (PRAVACHOL) 40 MG tablet Take 1 tablet (40 mg total) by mouth daily. Patient not taking: Reported on 10/11/2018 10/18/16   Elease Etienne, MD  predniSONE (DELTASONE) 10 MG tablet Take 4 tabs daily for 3 days, then 3 tabs daily for 3 days, then 2 tabs daily for 3 days, then 1 tablet daily for 3 days, then stop. Patient not taking: Reported on 10/11/2018 10/18/16   Elease Etienne, MD  thiamine 100 MG tablet Take 1 tablet (100 mg total) by mouth daily. Patient not taking: Reported on 10/11/2018 10/19/16   Elease Etienne, MD    Inpatient Medications: Scheduled Meds: . folic acid  1 mg Intravenous Daily  . [START ON 10/13/2018] Influenza vac split quadrivalent PF  0.5 mL Intramuscular Tomorrow-1000  . insulin aspart  0-9 Units Subcutaneous TID WC  . levalbuterol  0.63 mg Nebulization TID  . LORazepam  1 mg Oral BID  . thiamine injection  100 mg Intravenous Daily   Continuous Infusions: . sodium chloride 125 mL/hr at 10/11/18 2319  . azithromycin    . cefTRIAXone (ROCEPHIN)  IV    . diltiazem (CARDIZEM) infusion 15 mg/hr (10/12/18 0251)   PRN Meds:   Allergies:   No Known Allergies  Social History:   Social History   Socioeconomic History  . Marital status: Single    Spouse name: Not on file  . Number of children: Not on file  . Years of education: Not on file  . Highest education level: Not on file  Occupational History  . Not on file  Social Needs  . Financial resource strain: Not on file  . Food insecurity:    Worry: Not on file    Inability: Not on file  . Transportation needs:    Medical: Not on file    Non-medical: Not on file  Tobacco Use  . Smoking status: Light Tobacco Smoker    Packs/day: 0.00    Types: Cigarettes  . Smokeless tobacco: Never Used  Substance and Sexual Activity  . Alcohol use: Yes    Comment: 6 pkg weekly  . Drug use: Yes    Types: Marijuana    Comment: "I take a  hit in the morning to get me going"  . Sexual activity: Never    Birth control/protection: None  Lifestyle  . Physical activity:    Days per week: Not on file    Minutes per session: Not on file  . Stress: Not on file  Relationships  . Social connections:    Talks on phone: Not on file    Gets together: Not on file    Attends religious service: Not on file    Active member of club or organization: Not on file    Attends meetings of clubs or organizations: Not on file    Relationship status: Not on file  .  Intimate partner violence:    Fear of current or ex partner: Not on file    Emotionally abused: Not on file    Physically abused: Not on file    Forced sexual activity: Not on file  Other Topics Concern  . Not on file  Social History Narrative  . Not on file    Family History:    Family History  Problem Relation Age of Onset  . Suicidality Father      ROS:  Please see the history of present illness.   All other ROS reviewed and negative.     Physical Exam/Data:   Vitals:   10/12/18 0026 10/12/18 0419 10/12/18 0744 10/12/18 1201  BP: 129/72 127/66 133/73 139/84  Pulse: (!) 155 (!) 157 (!) 114 (!) 109  Resp:   (!) 26 (!) 23  Temp: 97.8 F (36.6 C) 98.9 F (37.2 C) 98.1 F (36.7 C) 98.7 F (37.1 C)  TempSrc: Oral Oral Oral Oral  SpO2: 99% 99% 96% 96%  Weight:  109.9 kg      Intake/Output Summary (Last 24 hours) at 10/12/2018 1304 Last data filed at 10/12/2018 0657 Gross per 24 hour  Intake 952.9 ml  Output 250 ml  Net 702.9 ml   Last 3 Weights 10/12/2018 10/14/2016 10/13/2016  Weight (lbs) 242 lb 4.6 oz 257 lb 4.4 oz 265 lb  Weight (kg) 109.9 kg 116.7 kg 120.203 kg  Some encounter information is confidential and restricted. Go to Review Flowsheets activity to see all data.     Body mass index is 31.97 kg/m.  General:  Obese elderly WM, multiple face and upper extremity lacerations/ bruises, right periorbital hematoma, laceration across nasal bridge, no  distress HEENT: right periorbital hematoma, laceration across nasal bridge Lymph: no adenopathy Neck: no JVD Endocrine:  No thryomegaly Vascular: No carotid bruits; FA pulses 2+ bilaterally without bruits  Cardiac:  Irregular rhythm, tachy rate Lungs:  clear to auscultation bilaterally, no wheezing, rhonchi or rales  Abd: soft, nontender, no hepatomegaly  Ext: no edema, dermatis on LEEs Musculoskeletal:  No deformities, BUE and BLE strength normal and equal Skin: warm and dry  Neuro:  CNs 2-12 intact, no focal abnormalities noted Psych:  Normal affect   EKG:  The EKG was personally reviewed and demonstrates:  Atrial flutter w/ RVR in the 140s Telemetry:  Telemetry was personally reviewed and demonstrates:  Atrial flutter 120s  Relevant CV Studies: None   Laboratory Data:  Chemistry Recent Labs  Lab 10/11/18 1552 10/12/18 0427  NA 134* 135  K 4.5 3.9  CL 94* 99  CO2 20* 25  GLUCOSE 72 150*  BUN 12 12  CREATININE 0.94 1.06  CALCIUM 9.0 7.7*  GFRNONAA >60 >60  GFRAA >60 >60  ANIONGAP 20* 11    Recent Labs  Lab 10/11/18 1552 10/12/18 0427  PROT 6.8 5.3*  ALBUMIN 3.7 2.8*  AST 93* 55*  ALT 60* 43  ALKPHOS 62 49  BILITOT 3.1* 2.0*   Hematology Recent Labs  Lab 10/11/18 1551 10/12/18 0427  WBC 5.3 4.8  RBC 4.86 3.63*  HGB 16.8 12.8*  HCT 50.0 36.2*  MCV 102.9* 99.7  MCH 34.6* 35.3*  MCHC 33.6 35.4  RDW 12.6 12.5  PLT PLATELET CLUMPS NOTED ON SMEAR, UNABLE TO ESTIMATE 138*   Cardiac Enzymes Recent Labs  Lab 10/11/18 2223 10/12/18 0427  TROPONINI <0.03 <0.03    Recent Labs  Lab 10/11/18 1642  TROPIPOC 0.02    BNPNo results for input(s): BNP,  PROBNP in the last 168 hours.  DDimer No results for input(s): DDIMER in the last 168 hours.  Radiology/Studies:  Ct Head Wo Contrast  Result Date: 10/11/2018 CLINICAL DATA:  Neck and back pain and nasal aspiration following a fall today while intoxicated. EXAM: CT HEAD WITHOUT CONTRAST CT MAXILLOFACIAL  WITHOUT CONTRAST CT CERVICAL SPINE WITHOUT CONTRAST TECHNIQUE: Multidetector CT imaging of the head, cervical spine, and maxillofacial structures were performed using the standard protocol without intravenous contrast. Multiplanar CT image reconstructions of the cervical spine and maxillofacial structures were also generated. COMPARISON:  Head and cervical spine CT dated 03/16/2013. FINDINGS: CT HEAD FINDINGS Brain: Mild-to-moderate enlargement of the ventricles and subarachnoid spaces with mild progression. Mild to moderate patchy white matter low density in both cerebral hemispheres with mild progression. No intracranial hemorrhage, mass lesion or CT evidence of acute infarction. Vascular: No hyperdense vessel or unexpected calcification. Skull: Normal. Negative for fracture or focal lesion. Other: None. CT MAXILLOFACIAL FINDINGS Osseous: Interval fracture of the mid nasal septum with angulation and mild displacement of the anterior portion to the left. Small fracture of the inferior aspect of the distal nasal bone a mild inferiorly and posteriorly displaced fragment. Orbits: Intact. Sinuses: Moderate right inferior maxillary sinus mucosal thickening. Mild left maxillary sinus mucosal thickening with a small amount of fluid in the sinus. Soft tissues: Anterior nasal soft tissue irregularity superiorly with multiple small locules of air within the underlying soft tissues. CT CERVICAL SPINE FINDINGS Alignment: Mild reversal of the normal cervical lordosis. No subluxations. Skull base and vertebrae: No acute fracture. No primary bone lesion or focal pathologic process. Soft tissues and spinal canal: No prevertebral fluid or swelling. No visible canal hematoma. Disc levels: Multilevel degenerative changes, most pronounced at the C5-6 and C6-7 levels. Upper chest: Clear lung apices. Other: Bilateral carotid artery calcifications. IMPRESSION: 1. Interval fracture of the mid nasal septum with angulation and mild  displacement of the anterior portion to the left. 2. No skull fracture or intracranial hemorrhage. 3. No cervical spine fracture or subluxation. 4. Mildly progressive atrophy and chronic small vessel white matter ischemic changes in both cerebral hemispheres. 5. Cervical spine degenerative changes. 6. Bilateral carotid artery atheromatous calcifications. 7. Chronic bilateral maxillary sinusitis with an acute component or blood in the left maxillary sinus. Electronically Signed   By: Beckie Salts M.D.   On: 10/11/2018 16:27   Ct Angio Chest Pe W And/or Wo Contrast  Result Date: 10/11/2018 CLINICAL DATA:  Chest pain possible pulmonary embolism. EXAM: CT ANGIOGRAPHY CHEST WITH CONTRAST TECHNIQUE: Multidetector CT imaging of the chest was performed using the standard protocol during bolus administration of intravenous contrast. Multiplanar CT image reconstructions and MIPs were obtained to evaluate the vascular anatomy. CONTRAST:  ISOVUE-370 IOPAMIDOL (ISOVUE-370) INJECTION 76% COMPARISON:  Chest x-ray 10/11/2018 and 10/16/2016 FINDINGS: Cardiovascular: Heart is normal size. Calcified plaque over the left main and 3 vessel coronary arteries. Minimal calcified plaque over the thoracic aorta. Thoracic aorta is otherwise normal in caliber without evidence of dissection. Pulmonary arterial system is well opacified without evidence of emboli. Mediastinum/Nodes: No mediastinal or hilar adenopathy. Remaining mediastinal structures are normal. Lungs/Pleura: Lungs are adequately inflated with subtle hazy patchy airspace density bilaterally worse over the left lower lobe likely due to an infectious or inflammatory process. No effusion. Known chronic thin-walled cavitary lesion with small air-fluid level over the right lower lobe measuring 6.1 x 6.2 cm in AP and transverse dimension. This continues to slowly increase in size. There is no  evidence of mural nodularity or focal wall thickening. 2 mm subpleural nodule over  the lateral right upper lobe. Airways are otherwise unremarkable. Upper Abdomen: Mild diffuse low-attenuation of the liver. Minimal calcified plaque over the abdominal aorta. Mild cholelithiasis. Musculoskeletal: Degenerative change of the spine. Review of the MIP images confirms the above findings. IMPRESSION: No evidence of pulmonary embolism. Subtle bilateral patchy hazy airspace process worse over the left lower lobe likely an acute infectious or inflammatory process. Chronic slowly increase in size of a thin-walled cavitary lesion over the right lower lobe with small air-fluid level. No suspicious wall thickening or mural nodularity. Aortic Atherosclerosis (ICD10-I70.0). Atherosclerotic coronary artery disease. Cholelithiasis. Mild hepatic steatosis. Electronically Signed   By: Elberta Fortis M.D.   On: 10/11/2018 19:11   Ct Cervical Spine Wo Contrast  Result Date: 10/11/2018 CLINICAL DATA:  Neck and back pain and nasal aspiration following a fall today while intoxicated. EXAM: CT HEAD WITHOUT CONTRAST CT MAXILLOFACIAL WITHOUT CONTRAST CT CERVICAL SPINE WITHOUT CONTRAST TECHNIQUE: Multidetector CT imaging of the head, cervical spine, and maxillofacial structures were performed using the standard protocol without intravenous contrast. Multiplanar CT image reconstructions of the cervical spine and maxillofacial structures were also generated. COMPARISON:  Head and cervical spine CT dated 03/16/2013. FINDINGS: CT HEAD FINDINGS Brain: Mild-to-moderate enlargement of the ventricles and subarachnoid spaces with mild progression. Mild to moderate patchy white matter low density in both cerebral hemispheres with mild progression. No intracranial hemorrhage, mass lesion or CT evidence of acute infarction. Vascular: No hyperdense vessel or unexpected calcification. Skull: Normal. Negative for fracture or focal lesion. Other: None. CT MAXILLOFACIAL FINDINGS Osseous: Interval fracture of the mid nasal septum with  angulation and mild displacement of the anterior portion to the left. Small fracture of the inferior aspect of the distal nasal bone a mild inferiorly and posteriorly displaced fragment. Orbits: Intact. Sinuses: Moderate right inferior maxillary sinus mucosal thickening. Mild left maxillary sinus mucosal thickening with a small amount of fluid in the sinus. Soft tissues: Anterior nasal soft tissue irregularity superiorly with multiple small locules of air within the underlying soft tissues. CT CERVICAL SPINE FINDINGS Alignment: Mild reversal of the normal cervical lordosis. No subluxations. Skull base and vertebrae: No acute fracture. No primary bone lesion or focal pathologic process. Soft tissues and spinal canal: No prevertebral fluid or swelling. No visible canal hematoma. Disc levels: Multilevel degenerative changes, most pronounced at the C5-6 and C6-7 levels. Upper chest: Clear lung apices. Other: Bilateral carotid artery calcifications. IMPRESSION: 1. Interval fracture of the mid nasal septum with angulation and mild displacement of the anterior portion to the left. 2. No skull fracture or intracranial hemorrhage. 3. No cervical spine fracture or subluxation. 4. Mildly progressive atrophy and chronic small vessel white matter ischemic changes in both cerebral hemispheres. 5. Cervical spine degenerative changes. 6. Bilateral carotid artery atheromatous calcifications. 7. Chronic bilateral maxillary sinusitis with an acute component or blood in the left maxillary sinus. Electronically Signed   By: Beckie Salts M.D.   On: 10/11/2018 16:27   Dg Pelvis Portable  Result Date: 10/11/2018 CLINICAL DATA:  Larey Seat in home today EXAM: PORTABLE PELVIS 1-2 VIEWS COMPARISON:  Portable exam 1506 hours without priors for comparison FINDINGS: Osseous mineralization normal. Hip and SI joint spaces preserved. No acute fracture, dislocation, or bone destruction. IMPRESSION: No acute osseous abnormalities. Electronically Signed    By: Ulyses Southward M.D.   On: 10/11/2018 15:24   Dg Chest Portable 1 View  Result Date: 10/11/2018 CLINICAL DATA:  Fell at home today, history hypertension, diabetes mellitus, COPD, smoker EXAM: PORTABLE CHEST 1 VIEW COMPARISON:  Portable exam 1506 hours compared to 10/16/2016 FINDINGS: Normal heart size, mediastinal contours, and pulmonary vascularity. 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 Lungs clear. No infiltrate, pleural effusion or pneumothorax. No acute osseous findings. IMPRESSION: Interval increase in size of an air cyst/pneumatocele in the mid RIGHT lung now 6.6 x 6.9 cm. Otherwise negative exam. Electronically Signed   By: Ulyses Southward M.D.   On: 10/11/2018 15:23   Ct Maxillofacial Wo Contrast  Result Date: 10/11/2018 CLINICAL DATA:  Neck and back pain and nasal aspiration following a fall today while intoxicated. EXAM: CT HEAD WITHOUT CONTRAST CT MAXILLOFACIAL WITHOUT CONTRAST CT CERVICAL SPINE WITHOUT CONTRAST TECHNIQUE: Multidetector CT imaging of the head, cervical spine, and maxillofacial structures were performed using the standard protocol without intravenous contrast. Multiplanar CT image reconstructions of the cervical spine and maxillofacial structures were also generated. COMPARISON:  Head and cervical spine CT dated 03/16/2013. FINDINGS: CT HEAD FINDINGS Brain: Mild-to-moderate enlargement of the ventricles and subarachnoid spaces with mild progression. Mild to moderate patchy white matter low density in both cerebral hemispheres with mild progression. No intracranial hemorrhage, mass lesion or CT evidence of acute infarction. Vascular: No hyperdense vessel or unexpected calcification. Skull: Normal. Negative for fracture or focal lesion. Other: None. CT MAXILLOFACIAL FINDINGS Osseous: Interval fracture of the mid nasal septum with angulation and mild displacement of the anterior portion to the left. Small fracture of the  inferior aspect of the distal nasal bone a mild inferiorly and posteriorly displaced fragment. Orbits: Intact. Sinuses: Moderate right inferior maxillary sinus mucosal thickening. Mild left maxillary sinus mucosal thickening with a small amount of fluid in the sinus. Soft tissues: Anterior nasal soft tissue irregularity superiorly with multiple small locules of air within the underlying soft tissues. CT CERVICAL SPINE FINDINGS Alignment: Mild reversal of the normal cervical lordosis. No subluxations. Skull base and vertebrae: No acute fracture. No primary bone lesion or focal pathologic process. Soft tissues and spinal canal: No prevertebral fluid or swelling. No visible canal hematoma. Disc levels: Multilevel degenerative changes, most pronounced at the C5-6 and C6-7 levels. Upper chest: Clear lung apices. Other: Bilateral carotid artery calcifications. IMPRESSION: 1. Interval fracture of the mid nasal septum with angulation and mild displacement of the anterior portion to the left. 2. No skull fracture or intracranial hemorrhage. 3. No cervical spine fracture or subluxation. 4. Mildly progressive atrophy and chronic small vessel white matter ischemic changes in both cerebral hemispheres. 5. Cervical spine degenerative changes. 6. Bilateral carotid artery atheromatous calcifications. 7. Chronic bilateral maxillary sinusitis with an acute component or blood in the left maxillary sinus. Electronically Signed   By: Beckie Salts M.D.   On: 10/11/2018 16:27    Assessment and Plan:   73 y.o. male w/ h/o heavy ETOH use, T2DM, HTN, HLD, bipolar disorder, Schizophrenia and PTSD brought to the ED after being found by family on the floor after a fall, sustained multiple lacerations, periorbital hematoma and nasal bone fracture, also found to be in atrial flutter w/ RVR and being treated for possible aspiration PNA. Cardiology consulted for atrial flutter, at the request of Dr. Lajuana Ripple, Internal Medicine.   1. Atrial  Flutter w/ RVR: in the setting of heavy ETOH use, hypomagnesemia and possible aspiration PNA. V-rates in the 140s initially, now in the 120s. TSH WNL. WBC normal, Hgb ok. K WNL. Troponin's  negative x 3. He is asymptomatic at rest but notes some exertional dyspnea. No CP.    Difficult situation. Atrial flutter is difficult to rate control and, often, rhythm control strategy is preferred. However this approach would require anticoagulation and this pt is not a good candidate for multiple reasons. His heavy ETOH abuse to the point of intoxication increases his risk for future falls which could result in lifetherating bleeding. Also at increased risk for GIB w/ anticoagulant. Also not a good candidate for anticoagulation given his multiple psychiatric problems which may affect compliance.   In this case, the best treatment at this time would be attempt at rate control only. He would have to prove that he can abstain from ETOH before we would consider long term anticoagulation. He states that he is willing to try quitting.   In the meantime, we will try treating with Cardizem and will add  blocker. We will start w/ metoprolol 25 BID. Will continue IV Cardizem for now and will try to wean to PO. If we are unable to decently control HR/ symptoms with rate control, then we may ultimately need to do a TEE guided DCCV prior to d/c and plan for short term anticoagulation x 4 weeks post cardioversion. Would also like to get an echo, but would prefer to do so when rate is a bit slower.   2. Possible Aspiration PNA: Abx per IM.   3. Fall w/ Injury: in the setting of intoxication. Sustained multiple lacerations, periorbital hematoma and nasal bone fracture.  4. ETOH Abuse: HPI outlines that he drinks vodka daily. Pt reports he goes through 1/2 gallon every 3 days. ETOH level was elevated in the ED at 229. On CIWA protocol. Management per primary.   5. Hypomagnesemia: 1.2 on admit. Likely 2/2 chronic ETOH use. He  received supplementation yesterday, magnesium sulfate 2 g IV. Will recheck Mg level today. Further supplementation if needed.   6. T2DM: management per IM.   7. HTN: controlled currently. Adding metoprolol for rate control. Monitor.   8. Psychiatric Disorders: bipolar 2 disorder, schizophrenia and PTSD listed in medical record. Management per IM. Also heavy ETOH abuse. Would benefit from counseling.   9. Cystic Lesion: noted on chest CT. Pulmonology consult pending.    For questions or updates, please contact [facility] Please consult [email or web address] for contact info under     Signed, Robbie LisBrittainy Simmons, PA-C  10/12/2018 1:04 PM  History and all data above reviewed.  Patient examined.  I agree with the findings as above.  This gentleman has no prior cardiac history.  We are called because he is in atrial flutter with rapid rate.  He was found down as above.  He lives alone.  He drinks alcohol excessively although he does not think he is an alcoholic.  He leaves his apartment rarely.  He does not have syncope although he does report occasional falls.  He does not notice any palpitations.  He denies any shortness of breath, PND or orthopnea.  He has no weight gain or edema.  He had 2 children although 1 of his children died recently.  He has a daughter who lives in [place] and she has his food delivered to him.  He is a [place] [employer], [employer] veteran.  He has psychiatric diagnosis as above but does not get these treated by a psychiatrist.  The patient exam reveals COR: Irregular, no murmurs,  Lungs: Decreased breath sounds without wheezing or crackles,  Abd: Positive bowel sounds, no rebound,  no guarding, Ext no edema.  All available labs, radiology testing, previous records reviewed. Agree with documented assessment and plan.  Atrial flutter the patient has atrial flutter and we can increase AV nodal blocking agents and titrate these over the weekend to control the rate.  At this point he would be a very  poor candidate for anticoagulation unless he moves to a nursing home post discharge.  He and I had a long and frank discussion about the difficulties of using anticoagulation with falls and his alcohol use.  If however we cannot control his rate with AV nodal blocking agents alone he might end up needing to do a TEE cardioversion with at least a short attempt at anticoagulation and to consider possible atrial flutter ablation.  For now we will add beta-blocker and immediate release form which can be titrated and consolidated over the weekend as his Cardizem comes off.  No anticoagulation for now.  Fayrene FearingJames Norita Meigs  2:36 PM  10/12/2018

## 2018-10-12 NOTE — Progress Notes (Signed)
Dr Guinevere Scarlet text paged with return call and updated with pt daughter request for updates and her request to see a social worker for possible placement post discharge. Dr. To see pt and call pt's daughter.

## 2018-10-13 ENCOUNTER — Other Ambulatory Visit (HOSPITAL_COMMUNITY): Payer: Self-pay

## 2018-10-13 DIAGNOSIS — I10 Essential (primary) hypertension: Secondary | ICD-10-CM

## 2018-10-13 DIAGNOSIS — E119 Type 2 diabetes mellitus without complications: Secondary | ICD-10-CM

## 2018-10-13 DIAGNOSIS — S0121XS Laceration without foreign body of nose, sequela: Secondary | ICD-10-CM

## 2018-10-13 DIAGNOSIS — D696 Thrombocytopenia, unspecified: Secondary | ICD-10-CM

## 2018-10-13 DIAGNOSIS — E871 Hypo-osmolality and hyponatremia: Secondary | ICD-10-CM

## 2018-10-13 DIAGNOSIS — Z720 Tobacco use: Secondary | ICD-10-CM

## 2018-10-13 LAB — GLUCOSE, CAPILLARY
Glucose-Capillary: 114 mg/dL — ABNORMAL HIGH (ref 70–99)
Glucose-Capillary: 151 mg/dL — ABNORMAL HIGH (ref 70–99)
Glucose-Capillary: 148 mg/dL — ABNORMAL HIGH (ref 70–99)
Glucose-Capillary: 123 mg/dL — ABNORMAL HIGH (ref 70–99)

## 2018-10-13 LAB — BASIC METABOLIC PANEL
Anion gap: 10 (ref 5–15)
BUN: 12 mg/dL (ref 8–23)
CALCIUM: 7.5 mg/dL — AB (ref 8.9–10.3)
CO2: 23 mmol/L (ref 22–32)
CREATININE: 0.87 mg/dL (ref 0.61–1.24)
Chloride: 102 mmol/L (ref 98–111)
GFR calc Af Amer: >60 mL/min (ref >60)
GFR calc non Af Amer: >60 mL/min (ref >60)
Glucose, Bld: 134 mg/dL — ABNORMAL HIGH (ref 70–99)
Potassium: 3.8 mmol/L (ref 3.5–5.1)
Sodium: 135 mmol/L (ref 135–145)

## 2018-10-13 LAB — MAGNESIUM: Magnesium: 1.4 mg/dL — ABNORMAL LOW (ref 1.7–2.4)

## 2018-10-13 MED ORDER — CHLORHEXIDINE GLUCONATE 4 % EX LIQD: Freq: Every day | CUTANEOUS | Status: DC | PRN

## 2018-10-13 MED ORDER — METOPROLOL TARTRATE 25 MG PO TABS: 25.0000 mg | ORAL_TABLET | Freq: Two times a day (BID) | ORAL | Status: DC

## 2018-10-13 MED ORDER — MAGNESIUM SULFATE 2 GM/50ML IV SOLN: 2.0000 g | Freq: Once | INTRAVENOUS | Status: DC

## 2018-10-13 MED ORDER — VITAMIN B-1 100 MG PO TABS
100.0000 mg | ORAL_TABLET | Freq: Every day | ORAL | Status: DC
  Administered 2018-10-14 – 2018-10-19 (×6): 100 mg via ORAL
  Filled 2018-10-13 (×6): qty 1

## 2018-10-13 MED ORDER — METOPROLOL TARTRATE 25 MG PO TABS
25.0000 mg | ORAL_TABLET | Freq: Two times a day (BID) | ORAL | Status: DC
  Administered 2018-10-13 – 2018-10-15 (×6): 25 mg via ORAL
  Filled 2018-10-13 (×6): qty 1

## 2018-10-13 MED ORDER — CHLORHEXIDINE GLUCONATE 4 % EX LIQD
CUTANEOUS | Status: AC
  Administered 2018-10-13: 12:00:00
  Filled 2018-10-13: qty 15

## 2018-10-13 MED ORDER — BACITRACIN ZINC 500 UNIT/GM EX OINT
TOPICAL_OINTMENT | CUTANEOUS | Status: DC | PRN
  Filled 2018-10-13: qty 28.4

## 2018-10-13 NOTE — Progress Notes (Addendum)
PROGRESS NOTE    Albert Hess  OLI:[id number]  DOB: 09-Dec-1945  DOA: 10/11/2018 PCP: Wilfrid Lund, PA  Brief Narrative:   73 year old male with history of hypertension, diabetes mellitus, heavy alcohol use, Schizophrenia, bipolar disorder, PTSD who lives alone presented after a fall in the setting of alcohol use/intoxication.  Patient noted to have  facial skin abrasions, periorbital hematoma and nasal bone fracture during the ED work-up.  On arrival patient was also in a flutter with RVR.  Chest x-ray reported interval increase in size of an air cyst/pneumatocele in the mid RIGHT lung, now 6.6 x 6.9 cm.  So underwent CT angios chest to rule out PE which was negative for pulmonary embolus but reported (in addition to slowly growing right lower lobe cavity with air-fluid level) subtle bilateral patchy hazy airspace process worse over the left lower lobe suggestive of acute infectious or inflammatory process.  Patient admitted with empiric antibiotics and cardiology consulted for new onset a flutter.  Patient was supposed to follow-up pulmonary as outpatient but he never did.  He reports living alone and daughter lives about 2-1/2 hours away.  He denies being in alcohol rehab program in the past.  Denies history of alcohol withdrawals or hospitalizations requiring ICU stay or alcohol related seizures.  He does not recollect the events leading to the fall.  Subjective:  He denies any acute complaints , he appears more awake alert and oriented.  No signs of withdrawal.  Denies any significant pain.  Objective: Vitals:   10/12/18 2117 10/13/18 0033 10/13/18 0422 10/13/18 0857  BP:  113/76 102/64 105/65  Pulse: 84 71 71 73  Resp:  (!) 25 (!) 24   Temp:   97.7 F (36.5 C)   TempSrc:   Oral   SpO2:   97%   Weight:   115 kg   Height:        Intake/Output Summary (Last 24 hours) at 10/13/2018 1502 Last data filed at 10/13/2018 0643 Gross per 24 hour  Intake 885.89 ml  Output 300 ml  Net  585.89 ml   Filed Weights   10/12/18 0419 10/13/18 0422  Weight: 109.9 kg 115 kg    Physical Examination:  General exam: Appears calm and comfortable .  Extensive bruising on the face with right periorbital edema/Steri-Strip on the nose Respiratory system: Clear to auscultation. Respiratory effort normal. Cardiovascular system: S1 & S2 heard, RRR. No JVD, murmurs, rubs, gallops or clicks.  Chronic leg/pedal edema. Gastrointestinal system: Abdomen is nondistended, soft and nontender. No organomegaly or masses felt. Normal bowel sounds heard. Central nervous system: Alert and oriented. No focal neurological deficits.  No signs of withdrawal Extremities: Symmetric 5 x 5 power. Skin: scaly dermatitis in lower extremities and abdominal wall (chronic per patient from agent orange exposure).  Onychomycosis in all the toes Psychiatry: Judgement and insight appear normal. Mood & affect appropriate.     Data Reviewed: I have personally reviewed following labs and imaging studies  CBC: Recent Labs  Lab 10/11/18 1551 10/12/18 0427  WBC 5.3 4.8  NEUTROABS 3.9 3.5  HGB 16.8 12.8*  HCT 50.0 36.2*  MCV 102.9* 99.7  PLT PLATELET CLUMPS NOTED ON SMEAR, UNABLE TO ESTIMATE 138*   Basic Metabolic Panel: Recent Labs  Lab 10/11/18 1552 10/11/18 2223 10/12/18 0427 10/12/18 1326 10/13/18 0640  NA 134*  --  135  --  135  K 4.5  --  3.9  --  3.8  CL 94*  --  99  --  102  CO2 20*  --  25  --  23  GLUCOSE 72  --  150*  --  134*  BUN 12  --  12  --  12  CREATININE 0.94  --  1.06  --  0.87  CALCIUM 9.0  --  7.7*  --  7.5*  MG  --  1.2*  --  1.1* 1.4*   GFR: Estimated Creatinine Clearance: 102 mL/min (by C-G formula based on SCr of 0.87 mg/dL). Liver Function Tests: Recent Labs  Lab 10/11/18 1552 10/12/18 0427  AST 93* 55*  ALT 60* 43  ALKPHOS 62 49  BILITOT 3.1* 2.0*  PROT 6.8 5.3*  ALBUMIN 3.7 2.8*   No results for input(s): LIPASE, AMYLASE in the last 168 hours. No results for  input(s): AMMONIA in the last 168 hours. Coagulation Profile: No results for input(s): INR, PROTIME in the last 168 hours. Cardiac Enzymes: Recent Labs  Lab 10/11/18 1552 10/11/18 2223 10/12/18 0427 10/12/18 1027  CKTOTAL 287  --   --   --   TROPONINI  --  <0.03 <0.03 <0.03   BNP (last 3 results) No results for input(s): PROBNP in the last 8760 hours. HbA1C: No results for input(s): HGBA1C in the last 72 hours. CBG: Recent Labs  Lab 10/12/18 1158 10/12/18 1618 10/12/18 2125 10/13/18 0753 10/13/18 1200  GLUCAP 156* 177* 136* 114* 151*   Lipid Profile: No results for input(s): CHOL, HDL, LDLCALC, TRIG, CHOLHDL, LDLDIRECT in the last 72 hours. Thyroid Function Tests: Recent Labs    10/11/18 2223  TSH 3.960   Anemia Panel: No results for input(s): VITAMINB12, FOLATE, FERRITIN, TIBC, IRON, RETICCTPCT in the last 72 hours. Sepsis Labs: Recent Labs  Lab 10/12/18 1656  PROCALCITON <0.10    Recent Results (from the past 240 hour(s))  Blood culture (routine x 2)     Status: None (Preliminary result)   Collection Time: 10/11/18 11:55 PM  Result Value Ref Range Status   Specimen Description BLOOD RIGHT FOREARM  Final   Special Requests   Final    BOTTLES DRAWN AEROBIC ONLY Blood Culture adequate volume   Culture   Final    NO GROWTH 1 DAY Performed at [facility], [address], [place], [place] [address]    Report Status PENDING  Incomplete  Blood culture (routine x 2)     Status: None (Preliminary result)   Collection Time: 10/12/18 12:05 AM  Result Value Ref Range Status   Specimen Description BLOOD RIGHT HAND  Final   Special Requests   Final    BOTTLES DRAWN AEROBIC ONLY Blood Culture adequate volume   Culture   Final    NO GROWTH 1 DAY Performed at [facility], [address], [place], [place] [address]    Report Status PENDING  Incomplete      Radiology Studies: Ct Head Wo Contrast  Result Date: 10/11/2018 CLINICAL DATA:  Neck and  back pain and nasal aspiration following a fall today while intoxicated. EXAM: CT HEAD WITHOUT CONTRAST CT MAXILLOFACIAL WITHOUT CONTRAST CT CERVICAL SPINE WITHOUT CONTRAST TECHNIQUE: Multidetector CT imaging of the head, cervical spine, and maxillofacial structures were performed using the standard protocol without intravenous contrast. Multiplanar CT image reconstructions of the cervical spine and maxillofacial structures were also generated. COMPARISON:  Head and cervical spine CT dated 03/16/2013. FINDINGS: CT HEAD FINDINGS Brain: Mild-to-moderate enlargement of the ventricles and subarachnoid spaces with mild progression. Mild to moderate patchy white matter low  density in both cerebral hemispheres with mild progression. No intracranial hemorrhage, mass lesion or CT evidence of acute infarction. Vascular: No hyperdense vessel or unexpected calcification. Skull: Normal. Negative for fracture or focal lesion. Other: None. CT MAXILLOFACIAL FINDINGS Osseous: Interval fracture of the mid nasal septum with angulation and mild displacement of the anterior portion to the left. Small fracture of the inferior aspect of the distal nasal bone a mild inferiorly and posteriorly displaced fragment. Orbits: Intact. Sinuses: Moderate right inferior maxillary sinus mucosal thickening. Mild left maxillary sinus mucosal thickening with a small amount of fluid in the sinus. Soft tissues: Anterior nasal soft tissue irregularity superiorly with multiple small locules of air within the underlying soft tissues. CT CERVICAL SPINE FINDINGS Alignment: Mild reversal of the normal cervical lordosis. No subluxations. Skull base and vertebrae: No acute fracture. No primary bone lesion or focal pathologic process. Soft tissues and spinal canal: No prevertebral fluid or swelling. No visible canal hematoma. Disc levels: Multilevel degenerative changes, most pronounced at the C5-6 and C6-7 levels. Upper chest: Clear lung apices. Other: Bilateral  carotid artery calcifications. IMPRESSION: 1. Interval fracture of the mid nasal septum with angulation and mild displacement of the anterior portion to the left. 2. No skull fracture or intracranial hemorrhage. 3. No cervical spine fracture or subluxation. 4. Mildly progressive atrophy and chronic small vessel white matter ischemic changes in both cerebral hemispheres. 5. Cervical spine degenerative changes. 6. Bilateral carotid artery atheromatous calcifications. 7. Chronic bilateral maxillary sinusitis with an acute component or blood in the left maxillary sinus. Electronically Signed   By: Beckie Salts M.D.   On: 10/11/2018 16:27   Ct Angio Chest Pe W And/or Wo Contrast  Result Date: 10/11/2018 CLINICAL DATA:  Chest pain possible pulmonary embolism. EXAM: CT ANGIOGRAPHY CHEST WITH CONTRAST TECHNIQUE: Multidetector CT imaging of the chest was performed using the standard protocol during bolus administration of intravenous contrast. Multiplanar CT image reconstructions and MIPs were obtained to evaluate the vascular anatomy. CONTRAST:  ISOVUE-370 IOPAMIDOL (ISOVUE-370) INJECTION 76% COMPARISON:  Chest x-ray 10/11/2018 and 10/16/2016 FINDINGS: Cardiovascular: Heart is normal size. Calcified plaque over the left main and 3 vessel coronary arteries. Minimal calcified plaque over the thoracic aorta. Thoracic aorta is otherwise normal in caliber without evidence of dissection. Pulmonary arterial system is well opacified without evidence of emboli. Mediastinum/Nodes: No mediastinal or hilar adenopathy. Remaining mediastinal structures are normal. Lungs/Pleura: Lungs are adequately inflated with subtle hazy patchy airspace density bilaterally worse over the left lower lobe likely due to an infectious or inflammatory process. No effusion. Known chronic thin-walled cavitary lesion with small air-fluid level over the right lower lobe measuring 6.1 x 6.2 cm in AP and transverse dimension. This continues to slowly  increase in size. There is no evidence of mural nodularity or focal wall thickening. 2 mm subpleural nodule over the lateral right upper lobe. Airways are otherwise unremarkable. Upper Abdomen: Mild diffuse low-attenuation of the liver. Minimal calcified plaque over the abdominal aorta. Mild cholelithiasis. Musculoskeletal: Degenerative change of the spine. Review of the MIP images confirms the above findings. IMPRESSION: No evidence of pulmonary embolism. Subtle bilateral patchy hazy airspace process worse over the left lower lobe likely an acute infectious or inflammatory process. Chronic slowly increase in size of a thin-walled cavitary lesion over the right lower lobe with small air-fluid level. No suspicious wall thickening or mural nodularity. Aortic Atherosclerosis (ICD10-I70.0). Atherosclerotic coronary artery disease. Cholelithiasis. Mild hepatic steatosis. Electronically Signed   By: Elberta Fortis M.D.  On: 10/11/2018 19:11   Ct Cervical Spine Wo Contrast  Result Date: 10/11/2018 CLINICAL DATA:  Neck and back pain and nasal aspiration following a fall today while intoxicated. EXAM: CT HEAD WITHOUT CONTRAST CT MAXILLOFACIAL WITHOUT CONTRAST CT CERVICAL SPINE WITHOUT CONTRAST TECHNIQUE: Multidetector CT imaging of the head, cervical spine, and maxillofacial structures were performed using the standard protocol without intravenous contrast. Multiplanar CT image reconstructions of the cervical spine and maxillofacial structures were also generated. COMPARISON:  Head and cervical spine CT dated 03/16/2013. FINDINGS: CT HEAD FINDINGS Brain: Mild-to-moderate enlargement of the ventricles and subarachnoid spaces with mild progression. Mild to moderate patchy white matter low density in both cerebral hemispheres with mild progression. No intracranial hemorrhage, mass lesion or CT evidence of acute infarction. Vascular: No hyperdense vessel or unexpected calcification. Skull: Normal. Negative for fracture or  focal lesion. Other: None. CT MAXILLOFACIAL FINDINGS Osseous: Interval fracture of the mid nasal septum with angulation and mild displacement of the anterior portion to the left. Small fracture of the inferior aspect of the distal nasal bone a mild inferiorly and posteriorly displaced fragment. Orbits: Intact. Sinuses: Moderate right inferior maxillary sinus mucosal thickening. Mild left maxillary sinus mucosal thickening with a small amount of fluid in the sinus. Soft tissues: Anterior nasal soft tissue irregularity superiorly with multiple small locules of air within the underlying soft tissues. CT CERVICAL SPINE FINDINGS Alignment: Mild reversal of the normal cervical lordosis. No subluxations. Skull base and vertebrae: No acute fracture. No primary bone lesion or focal pathologic process. Soft tissues and spinal canal: No prevertebral fluid or swelling. No visible canal hematoma. Disc levels: Multilevel degenerative changes, most pronounced at the C5-6 and C6-7 levels. Upper chest: Clear lung apices. Other: Bilateral carotid artery calcifications. IMPRESSION: 1. Interval fracture of the mid nasal septum with angulation and mild displacement of the anterior portion to the left. 2. No skull fracture or intracranial hemorrhage. 3. No cervical spine fracture or subluxation. 4. Mildly progressive atrophy and chronic small vessel white matter ischemic changes in both cerebral hemispheres. 5. Cervical spine degenerative changes. 6. Bilateral carotid artery atheromatous calcifications. 7. Chronic bilateral maxillary sinusitis with an acute component or blood in the left maxillary sinus. Electronically Signed   By: Beckie SaltsSteven  Reid M.D.   On: 10/11/2018 16:27   Dg Pelvis Portable  Result Date: 10/11/2018 CLINICAL DATA:  Larey SeatFell in home today EXAM: PORTABLE PELVIS 1-2 VIEWS COMPARISON:  Portable exam 1506 hours without priors for comparison FINDINGS: Osseous mineralization normal. Hip and SI joint spaces preserved. No acute  fracture, dislocation, or bone destruction. IMPRESSION: No acute osseous abnormalities. Electronically Signed   By: Ulyses SouthwardMark  Boles M.D.   On: 10/11/2018 15:24   Dg Chest Portable 1 View  Result Date: 10/11/2018 CLINICAL DATA:  Larey SeatFell at home today, history hypertension, diabetes mellitus, COPD, smoker EXAM: PORTABLE CHEST 1 VIEW COMPARISON:  Portable exam 1506 hours compared to 10/16/2016 FINDINGS: Normal heart size, mediastinal contours, and pulmonary vascularity. 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 Lungs clear. No infiltrate, pleural effusion or pneumothorax. No acute osseous findings. IMPRESSION: Interval increase in size of an air cyst/pneumatocele in the mid RIGHT lung now 6.6 x 6.9 cm. Otherwise negative exam. Electronically Signed   By: Ulyses SouthwardMark  Boles M.D.   On: 10/11/2018 15:23   Ct Maxillofacial Wo Contrast  Result Date: 10/11/2018 CLINICAL DATA:  Neck and back pain and nasal aspiration following a fall today while intoxicated. EXAM: CT HEAD  WITHOUT CONTRAST CT MAXILLOFACIAL WITHOUT CONTRAST CT CERVICAL SPINE WITHOUT CONTRAST TECHNIQUE: Multidetector CT imaging of the head, cervical spine, and maxillofacial structures were performed using the standard protocol without intravenous contrast. Multiplanar CT image reconstructions of the cervical spine and maxillofacial structures were also generated. COMPARISON:  Head and cervical spine CT dated 03/16/2013. FINDINGS: CT HEAD FINDINGS Brain: Mild-to-moderate enlargement of the ventricles and subarachnoid spaces with mild progression. Mild to moderate patchy white matter low density in both cerebral hemispheres with mild progression. No intracranial hemorrhage, mass lesion or CT evidence of acute infarction. Vascular: No hyperdense vessel or unexpected calcification. Skull: Normal. Negative for fracture or focal lesion. Other: None. CT MAXILLOFACIAL FINDINGS Osseous: Interval fracture of the mid nasal  septum with angulation and mild displacement of the anterior portion to the left. Small fracture of the inferior aspect of the distal nasal bone a mild inferiorly and posteriorly displaced fragment. Orbits: Intact. Sinuses: Moderate right inferior maxillary sinus mucosal thickening. Mild left maxillary sinus mucosal thickening with a small amount of fluid in the sinus. Soft tissues: Anterior nasal soft tissue irregularity superiorly with multiple small locules of air within the underlying soft tissues. CT CERVICAL SPINE FINDINGS Alignment: Mild reversal of the normal cervical lordosis. No subluxations. Skull base and vertebrae: No acute fracture. No primary bone lesion or focal pathologic process. Soft tissues and spinal canal: No prevertebral fluid or swelling. No visible canal hematoma. Disc levels: Multilevel degenerative changes, most pronounced at the C5-6 and C6-7 levels. Upper chest: Clear lung apices. Other: Bilateral carotid artery calcifications. IMPRESSION: 1. Interval fracture of the mid nasal septum with angulation and mild displacement of the anterior portion to the left. 2. No skull fracture or intracranial hemorrhage. 3. No cervical spine fracture or subluxation. 4. Mildly progressive atrophy and chronic small vessel white matter ischemic changes in both cerebral hemispheres. 5. Cervical spine degenerative changes. 6. Bilateral carotid artery atheromatous calcifications. 7. Chronic bilateral maxillary sinusitis with an acute component or blood in the left maxillary sinus. Electronically Signed   By: Beckie SaltsSteven  Reid M.D.   On: 10/11/2018 16:27        Scheduled Meds: . folic acid  1 mg Intravenous Daily  . insulin aspart  0-9 Units Subcutaneous TID WC  . LORazepam  1 mg Oral BID  . metoprolol tartrate  25 mg Oral BID  . thiamine injection  100 mg Intravenous Daily   Continuous Infusions: . ampicillin-sulbactam (UNASYN) IV 1.5 g (10/13/18 0909)    Assessment & Plan:    1.  New onset  atrial flutter with RVR: Likely precipitated by heavy alcohol use.  Appreciate cardiology evaluation/input.  Heart rate improved overnight and Cardizem drip discontinued this morning.  Agree patient not a candidate for chronic anticoagulation given ongoing heavy alcohol use/fall risk/GI bleed risk.  Beta-blockers dosage increased by cardiology and Cardizem drip discontinued. Echo pending.  Continue to monitor electrolytes and replace as needed.  Repeat EKG is improved QT interval at 445 ms (514 ms on admission).  Continue to monitor on beta-blockers.  If he becomes tachycardic again, can consider oral Cardizem.  2.  Patchy infiltrates and chronic right cavitary lung lesion: Seen by pulmonary.  Discontinued Rocephin/azithromycin and started Unasyn for aspiration coverage.Likely partially treated lung abscess in remote past. Pulmonary does not recommend bronch and recommended outpatient follow-up for repeat CT in 3 months.   3.  Syncope/fall: Likely secondary to problem #1 and problem #4.  Trauma work-up negative except for nasal fracture.  Supportive management.  CT chest negative for PE.  Echo pending.  Prolonged QTC on admission EKG.  Replacing magnesium.  Avoid QT prolonging agents.  DCed azithromycin.   4.  Alcohol dependence: Patient denies prior history of alcohol withdrawal.  Drinks half gallon of vodka daily.  Continue CIWA protocol/ thiamine.  Replace magnesium  5.  Diabetes mellitus type 2: On metformin at home.  Currently on sliding scale coverage.  Diabetic diet  6.  PTSD/bipolar disorder/schizophrenia: Previously on Risperdal, quetiapine but has not been taking medications for more than an year.  Reports that he was admitted to a facility in [place] for psychiatric issues and also for substance abuse.  Will need to follow-up upon discharge   DVT prophylaxis: SCD Code Status: Full code Family / Patient Communication: Discussed with patient today in detail.  I did discuss with his daughter  yesterday who stated she lives 2 hours away and recently assumed responsibility for his care as her brother who was the primary caregiver for patient passed away recently. Disposition Plan: To be determined.  Daughter wants to explore placement options.  Case management consulted  Since patient off Cardizem drip, will transfer to cardiac telemetry.   LOS: 1 day    Time spent: 35 minutes    Alessandra Bevels, MD [facility] Pager [phone]  If 7PM-7AM, please contact night-coverage [email or web address] Password [email or web address] 10/13/2018, 3:02 PM

## 2018-10-13 NOTE — Progress Notes (Signed)
Progress Note  Patient Name: Albert Hess Date of Encounter: 10/13/2018  Primary Cardiologist: Dr. Antoine Poche (new)  Subjective   He denies chest pain.  He thinks his shortness of breath has improved to some degree.  His only complaint this morning is facial pain.  Inpatient Medications    Scheduled Meds: . folic acid  1 mg Intravenous Daily  . Influenza vac split quadrivalent PF  0.5 mL Intramuscular Tomorrow-1000  . insulin aspart  0-9 Units Subcutaneous TID WC  . LORazepam  1 mg Oral BID  . metoprolol tartrate  12.5 mg Oral BID  . thiamine injection  100 mg Intravenous Daily   Continuous Infusions: . ampicillin-sulbactam (UNASYN) IV 1.5 g (10/13/18 0415)  . diltiazem (CARDIZEM) infusion 5 mg/hr (10/13/18 0646)   PRN Meds: acetaminophen **OR** acetaminophen, levalbuterol, LORazepam, ondansetron **OR** ondansetron (ZOFRAN) IV, sodium chloride flush   Vital Signs    Vitals:   10/12/18 1959 10/12/18 2117 10/13/18 0033 10/13/18 0422  BP: (!) 102/50  113/76 102/64  Pulse: 73 84 71 71  Resp: (!) 29  (!) 25 (!) 24  Temp: 98.3 F (36.8 C)   97.7 F (36.5 C)  TempSrc: Oral   Oral  SpO2: 95%   97%  Weight:    115 kg  Height:        Intake/Output Summary (Last 24 hours) at 10/13/2018 0841 Last data filed at 10/13/2018 0643 Gross per 24 hour  Intake 1125.89 ml  Output 300 ml  Net 825.89 ml   Filed Weights   10/12/18 0419 10/13/18 0422  Weight: 109.9 kg 115 kg    Telemetry    Rate controlled atrial flutter, 70 bpm range- Personally Reviewed  ECG    Atrial flutter, 71 bpm- Personally Reviewed  Physical Exam   GEN: No acute distress.   Neck: No JVD Cardiac:  Regular rate, irregular rhythm, no murmurs, rubs, or gallops.  Respiratory: Clear to auscultation bilaterally. GI: Soft, nontender, obese MS: No edema; No deformity. Neuro:  Nonfocal  Psych: Normal affect   Labs    Chemistry Recent Labs  Lab 10/11/18 1552 10/12/18 0427 10/13/18 0640  NA 134* 135  135  K 4.5 3.9 3.8  CL 94* 99 102  CO2 20* 25 23  GLUCOSE 72 150* 134*  BUN 12 12 12   CREATININE 0.94 1.06 0.87  CALCIUM 9.0 7.7* 7.5*  PROT 6.8 5.3*  --   ALBUMIN 3.7 2.8*  --   AST 93* 55*  --   ALT 60* 43  --   ALKPHOS 62 49  --   BILITOT 3.1* 2.0*  --   GFRNONAA >60 >60 >60  GFRAA >60 >60 >60  ANIONGAP 20* 11 10     Hematology Recent Labs  Lab 10/11/18 1551 10/12/18 0427  WBC 5.3 4.8  RBC 4.86 3.63*  HGB 16.8 12.8*  HCT 50.0 36.2*  MCV 102.9* 99.7  MCH 34.6* 35.3*  MCHC 33.6 35.4  RDW 12.6 12.5  PLT PLATELET CLUMPS NOTED ON SMEAR, UNABLE TO ESTIMATE 138*    Cardiac Enzymes Recent Labs  Lab 10/11/18 2223 10/12/18 0427 10/12/18 1027  TROPONINI <0.03 <0.03 <0.03    Recent Labs  Lab 10/11/18 1642  TROPIPOC 0.02     BNPNo results for input(s): BNP, PROBNP in the last 168 hours.   DDimer No results for input(s): DDIMER in the last 168 hours.   Radiology    Ct Head Wo Contrast  Result Date: 10/11/2018 CLINICAL DATA:  Neck and  back pain and nasal aspiration following a fall today while intoxicated. EXAM: CT HEAD WITHOUT CONTRAST CT MAXILLOFACIAL WITHOUT CONTRAST CT CERVICAL SPINE WITHOUT CONTRAST TECHNIQUE: Multidetector CT imaging of the head, cervical spine, and maxillofacial structures were performed using the standard protocol without intravenous contrast. Multiplanar CT image reconstructions of the cervical spine and maxillofacial structures were also generated. COMPARISON:  Head and cervical spine CT dated 03/16/2013. FINDINGS: CT HEAD FINDINGS Brain: Mild-to-moderate enlargement of the ventricles and subarachnoid spaces with mild progression. Mild to moderate patchy white matter low density in both cerebral hemispheres with mild progression. No intracranial hemorrhage, mass lesion or CT evidence of acute infarction. Vascular: No hyperdense vessel or unexpected calcification. Skull: Normal. Negative for fracture or focal lesion. Other: None. CT MAXILLOFACIAL  FINDINGS Osseous: Interval fracture of the mid nasal septum with angulation and mild displacement of the anterior portion to the left. Small fracture of the inferior aspect of the distal nasal bone a mild inferiorly and posteriorly displaced fragment. Orbits: Intact. Sinuses: Moderate right inferior maxillary sinus mucosal thickening. Mild left maxillary sinus mucosal thickening with a small amount of fluid in the sinus. Soft tissues: Anterior nasal soft tissue irregularity superiorly with multiple small locules of air within the underlying soft tissues. CT CERVICAL SPINE FINDINGS Alignment: Mild reversal of the normal cervical lordosis. No subluxations. Skull base and vertebrae: No acute fracture. No primary bone lesion or focal pathologic process. Soft tissues and spinal canal: No prevertebral fluid or swelling. No visible canal hematoma. Disc levels: Multilevel degenerative changes, most pronounced at the C5-6 and C6-7 levels. Upper chest: Clear lung apices. Other: Bilateral carotid artery calcifications. IMPRESSION: 1. Interval fracture of the mid nasal septum with angulation and mild displacement of the anterior portion to the left. 2. No skull fracture or intracranial hemorrhage. 3. No cervical spine fracture or subluxation. 4. Mildly progressive atrophy and chronic small vessel white matter ischemic changes in both cerebral hemispheres. 5. Cervical spine degenerative changes. 6. Bilateral carotid artery atheromatous calcifications. 7. Chronic bilateral maxillary sinusitis with an acute component or blood in the left maxillary sinus. Electronically Signed   By: Beckie Salts M.D.   On: 10/11/2018 16:27   Ct Angio Chest Pe W And/or Wo Contrast  Result Date: 10/11/2018 CLINICAL DATA:  Chest pain possible pulmonary embolism. EXAM: CT ANGIOGRAPHY CHEST WITH CONTRAST TECHNIQUE: Multidetector CT imaging of the chest was performed using the standard protocol during bolus administration of intravenous contrast.  Multiplanar CT image reconstructions and MIPs were obtained to evaluate the vascular anatomy. CONTRAST:  ISOVUE-370 IOPAMIDOL (ISOVUE-370) INJECTION 76% COMPARISON:  Chest x-ray 10/11/2018 and 10/16/2016 FINDINGS: Cardiovascular: Heart is normal size. Calcified plaque over the left main and 3 vessel coronary arteries. Minimal calcified plaque over the thoracic aorta. Thoracic aorta is otherwise normal in caliber without evidence of dissection. Pulmonary arterial system is well opacified without evidence of emboli. Mediastinum/Nodes: No mediastinal or hilar adenopathy. Remaining mediastinal structures are normal. Lungs/Pleura: Lungs are adequately inflated with subtle hazy patchy airspace density bilaterally worse over the left lower lobe likely due to an infectious or inflammatory process. No effusion. Known chronic thin-walled cavitary lesion with small air-fluid level over the right lower lobe measuring 6.1 x 6.2 cm in AP and transverse dimension. This continues to slowly increase in size. There is no evidence of mural nodularity or focal wall thickening. 2 mm subpleural nodule over the lateral right upper lobe. Airways are otherwise unremarkable. Upper Abdomen: Mild diffuse low-attenuation of the liver. Minimal calcified plaque over  the abdominal aorta. Mild cholelithiasis. Musculoskeletal: Degenerative change of the spine. Review of the MIP images confirms the above findings. IMPRESSION: No evidence of pulmonary embolism. Subtle bilateral patchy hazy airspace process worse over the left lower lobe likely an acute infectious or inflammatory process. Chronic slowly increase in size of a thin-walled cavitary lesion over the right lower lobe with small air-fluid level. No suspicious wall thickening or mural nodularity. Aortic Atherosclerosis (ICD10-I70.0). Atherosclerotic coronary artery disease. Cholelithiasis. Mild hepatic steatosis. Electronically Signed   By: Elberta Fortis M.D.   On: 10/11/2018 19:11    Ct Cervical Spine Wo Contrast  Result Date: 10/11/2018 CLINICAL DATA:  Neck and back pain and nasal aspiration following a fall today while intoxicated. EXAM: CT HEAD WITHOUT CONTRAST CT MAXILLOFACIAL WITHOUT CONTRAST CT CERVICAL SPINE WITHOUT CONTRAST TECHNIQUE: Multidetector CT imaging of the head, cervical spine, and maxillofacial structures were performed using the standard protocol without intravenous contrast. Multiplanar CT image reconstructions of the cervical spine and maxillofacial structures were also generated. COMPARISON:  Head and cervical spine CT dated 03/16/2013. FINDINGS: CT HEAD FINDINGS Brain: Mild-to-moderate enlargement of the ventricles and subarachnoid spaces with mild progression. Mild to moderate patchy white matter low density in both cerebral hemispheres with mild progression. No intracranial hemorrhage, mass lesion or CT evidence of acute infarction. Vascular: No hyperdense vessel or unexpected calcification. Skull: Normal. Negative for fracture or focal lesion. Other: None. CT MAXILLOFACIAL FINDINGS Osseous: Interval fracture of the mid nasal septum with angulation and mild displacement of the anterior portion to the left. Small fracture of the inferior aspect of the distal nasal bone a mild inferiorly and posteriorly displaced fragment. Orbits: Intact. Sinuses: Moderate right inferior maxillary sinus mucosal thickening. Mild left maxillary sinus mucosal thickening with a small amount of fluid in the sinus. Soft tissues: Anterior nasal soft tissue irregularity superiorly with multiple small locules of air within the underlying soft tissues. CT CERVICAL SPINE FINDINGS Alignment: Mild reversal of the normal cervical lordosis. No subluxations. Skull base and vertebrae: No acute fracture. No primary bone lesion or focal pathologic process. Soft tissues and spinal canal: No prevertebral fluid or swelling. No visible canal hematoma. Disc levels: Multilevel degenerative changes, most  pronounced at the C5-6 and C6-7 levels. Upper chest: Clear lung apices. Other: Bilateral carotid artery calcifications. IMPRESSION: 1. Interval fracture of the mid nasal septum with angulation and mild displacement of the anterior portion to the left. 2. No skull fracture or intracranial hemorrhage. 3. No cervical spine fracture or subluxation. 4. Mildly progressive atrophy and chronic small vessel white matter ischemic changes in both cerebral hemispheres. 5. Cervical spine degenerative changes. 6. Bilateral carotid artery atheromatous calcifications. 7. Chronic bilateral maxillary sinusitis with an acute component or blood in the left maxillary sinus. Electronically Signed   By: Beckie Salts M.D.   On: 10/11/2018 16:27   Dg Pelvis Portable  Result Date: 10/11/2018 CLINICAL DATA:  Larey Seat in home today EXAM: PORTABLE PELVIS 1-2 VIEWS COMPARISON:  Portable exam 1506 hours without priors for comparison FINDINGS: Osseous mineralization normal. Hip and SI joint spaces preserved. No acute fracture, dislocation, or bone destruction. IMPRESSION: No acute osseous abnormalities. Electronically Signed   By: Ulyses Southward M.D.   On: 10/11/2018 15:24   Dg Chest Portable 1 View  Result Date: 10/11/2018 CLINICAL DATA:  Larey Seat at home today, history hypertension, diabetes mellitus, COPD, smoker EXAM: PORTABLE CHEST 1 VIEW COMPARISON:  Portable exam 1506 hours compared to 10/16/2016 FINDINGS: Normal heart size, mediastinal contours, and pulmonary vascularity. Best boy  cyst versus pneumatocele at the mid RIGHT lung, 6.6 x 6.9 cm, increased since previous exam. Lungs clear. No infiltrate, pleural effusion or pneumothorax. No acute osseous findings. IMPRESSION: Interval increase in size of an air cyst/pneumatocele in the mid RIGHT lung now 6.6 x 6.9 cm. Otherwise negative exam. Electronically Signed   By: Ulyses Southward M.D.   On: 10/11/2018 15:23   Ct Maxillofacial Wo Contrast  Result Date: 10/11/2018 CLINICAL DATA:  Neck and back pain  and nasal aspiration following a fall today while intoxicated. EXAM: CT HEAD WITHOUT CONTRAST CT MAXILLOFACIAL WITHOUT CONTRAST CT CERVICAL SPINE WITHOUT CONTRAST TECHNIQUE: Multidetector CT imaging of the head, cervical spine, and maxillofacial structures were performed using the standard protocol without intravenous contrast. Multiplanar CT image reconstructions of the cervical spine and maxillofacial structures were also generated. COMPARISON:  Head and cervical spine CT dated 03/16/2013. FINDINGS: CT HEAD FINDINGS Brain: Mild-to-moderate enlargement of the ventricles and subarachnoid spaces with mild progression. Mild to moderate patchy white matter low density in both cerebral hemispheres with mild progression. No intracranial hemorrhage, mass lesion or CT evidence of acute infarction. Vascular: No hyperdense vessel or unexpected calcification. Skull: Normal. Negative for fracture or focal lesion. Other: None. CT MAXILLOFACIAL FINDINGS Osseous: Interval fracture of the mid nasal septum with angulation and mild displacement of the anterior portion to the left. Small fracture of the inferior aspect of the distal nasal bone a mild inferiorly and posteriorly displaced fragment. Orbits: Intact. Sinuses: Moderate right inferior maxillary sinus mucosal thickening. Mild left maxillary sinus mucosal thickening with a small amount of fluid in the sinus. Soft tissues: Anterior nasal soft tissue irregularity superiorly with multiple small locules of air within the underlying soft tissues. CT CERVICAL SPINE FINDINGS Alignment: Mild reversal of the normal cervical lordosis. No subluxations. Skull base and vertebrae: No acute fracture. No primary bone lesion or focal pathologic process. Soft tissues and spinal canal: No prevertebral fluid or swelling. No visible canal hematoma. Disc levels: Multilevel degenerative changes, most pronounced at the C5-6 and C6-7 levels. Upper chest: Clear lung apices. Other: Bilateral carotid  artery calcifications. IMPRESSION: 1. Interval fracture of the mid nasal septum with angulation and mild displacement of the anterior portion to the left. 2. No skull fracture or intracranial hemorrhage. 3. No cervical spine fracture or subluxation. 4. Mildly progressive atrophy and chronic small vessel white matter ischemic changes in both cerebral hemispheres. 5. Cervical spine degenerative changes. 6. Bilateral carotid artery atheromatous calcifications. 7. Chronic bilateral maxillary sinusitis with an acute component or blood in the left maxillary sinus. Electronically Signed   By: Beckie Salts M.D.   On: 10/11/2018 16:27    Cardiac Studies   None at present  Patient Profile     73 y.o. male w/ h/o heavy ETOH use, T2DM, HTN, HLD, bipolar disorder, Schizophrenia and PTSD brought to the ED after being found by family on the floor after a fall, sustained multiple lacerations, periorbital hematoma and nasal bone fracture, also found to be in atrial flutter w/ RVR and being treated for possible aspiration PNA. Cardiology consulted for atrial flutter, at the request of Dr. Lajuana Ripple, Internal Medicine.   Assessment & Plan    1. Atrial Flutter w/ RVR: in the setting of heavy ETOH use, hypomagnesemia and possible aspiration PNA. V-rates in the 140s initially, now in the 70s.  TSH, potassium, and troponins are all normal.  He is very mildly anemic.  Magnesium remains low at 1.4 today which I will replace. Heart rates controlled  in an 70 bpm range on Lopressor 12.5 mg twice daily and IV Cardizem 5 mg/h. I will increase Lopressor to 25 mg twice daily and stop IV Cardizem.  I will obtain an echocardiogram to evaluate cardiac structure and function. Poor anticoagulation candidate due to history of alcohol abuse, psychiatric issues, and increased risk for falls. He would have to prove that he can abstain from ETOH before we would consider long term anticoagulation.    2. Possible Aspiration PNA: Abx per  IM.   3. Fall w/ Injury: in the setting of intoxication. Sustained multiple lacerations, periorbital hematoma and nasal bone fracture.  4. ETOH Abuse: HPI outlines that he drinks vodka daily. Pt reports he goes through 1/2 gallon every 3 days. ETOH level was elevated in the ED at 229. On CIWA protocol. Management per primary.   5. Hypomagnesemia: 1.2 on admit.  Currently 1.4.  I will give another 2 g IV.  Likely 2/2 chronic ETOH use.   6. T2DM: management per IM.   7. HTN:  Blood pressure is low normal.  I am increasing Lopressor to 25 mg twice daily and stopping IV Cardizem.   8. Psychiatric Disorders: bipolar 2 disorder, schizophrenia and PTSD listed in medical record. Management per IM. Also heavy ETOH abuse. Would benefit from counseling.   9. Cystic Lesion: noted on chest CT. Pulmonology has consulted.   For questions or updates, please contact [facility] Please consult [email or web address] for contact info under Cardiology/STEMI.      Signed, Prentice DockerSuresh Koneswaran, MD  10/13/2018, 8:41 AM

## 2018-10-14 ENCOUNTER — Inpatient Hospital Stay (HOSPITAL_COMMUNITY): Payer: Medicare Other

## 2018-10-14 LAB — BASIC METABOLIC PANEL
Anion gap: 6 (ref 5–15)
BUN: 11 mg/dL (ref 8–23)
CHLORIDE: 100 mmol/L (ref 98–111)
CO2: 28 mmol/L (ref 22–32)
Calcium: 8.2 mg/dL — ABNORMAL LOW (ref 8.9–10.3)
Creatinine, Ser: 0.83 mg/dL (ref 0.61–1.24)
GFR calc Af Amer: >60 mL/min (ref >60)
GFR calc non Af Amer: >60 mL/min (ref >60)
Glucose, Bld: 141 mg/dL — ABNORMAL HIGH (ref 70–99)
Potassium: 4.2 mmol/L (ref 3.5–5.1)
Sodium: 134 mmol/L — ABNORMAL LOW (ref 135–145)

## 2018-10-14 LAB — GLUCOSE, CAPILLARY
Glucose-Capillary: 111 mg/dL — ABNORMAL HIGH (ref 70–99)
Glucose-Capillary: 159 mg/dL — ABNORMAL HIGH (ref 70–99)
Glucose-Capillary: 122 mg/dL — ABNORMAL HIGH (ref 70–99)
Glucose-Capillary: 131 mg/dL — ABNORMAL HIGH (ref 70–99)

## 2018-10-14 MED ORDER — FOLIC ACID 1 MG PO TABS
1.0000 mg | ORAL_TABLET | Freq: Every day | ORAL | Status: DC
  Administered 2018-10-15 – 2018-10-19 (×5): 1 mg via ORAL
  Filled 2018-10-14 (×5): qty 1

## 2018-10-14 MED ORDER — MAGIC MOUTHWASH W/LIDOCAINE
5.0000 mL | Freq: Once | ORAL | Status: AC | PRN
  Administered 2018-10-14: 5 mL via ORAL
  Filled 2018-10-14: qty 5

## 2018-10-14 MED ORDER — TRAMADOL HCL 50 MG PO TABS
50.0000 mg | ORAL_TABLET | Freq: Once | ORAL | Status: AC
  Administered 2018-10-14: 50 mg via ORAL
  Filled 2018-10-14: qty 1

## 2018-10-14 NOTE — Significant Event (Signed)
Rapid Response Event Note  Overview: Respiratory   Initial Focused Assessment: Called by nurses about patient having shortness of breath and irregular breathing pattern. Upon arrival, patient was awake/alert, endorses shortness of breath, very irregular breathing pattern was noticed. Oxygen saturations were > 95% on 3L [place], RR was fluctuating 25-35 breaths/min. Breathing appeared paradoxical at times, lung sounds were very diminished, L > R. + WOB and belly breathing coupled with mouth breathing (+ facial trauma - involving nose). Patient denies pain but appears stiff in bed and uncomfortable. RT was called to bedside and [facility] NP was paged and came to bedside as well  Interventions:  -- STAT CXR -- Xopenox x 1  Plan of Care: -- Monitor respiratory status -- Encourage IS -- Pain management, patient endorses he had a "drug problem earlier in life" and was hesitating to take narcotics for pain mgmt, I asked if he would be okay APAP or Ultram, he stated that would be ok. Also he endorsed his lips and mouth were very painful and that he has a hard time eating - magic mouthwash was ordered as well  Event Summary:    at    Call Time 0151 Arrival Time 0153 End Time 0315   Romonda Parker R

## 2018-10-14 NOTE — Progress Notes (Signed)
Notified by CCMD that patient's heart rate was sustaining in the 180's.  Upon entering the room patient was noted lying on his side. He was breathing using accessory muscles and appeared to be SOB. HR down to 70's when he changed position to supine. Rapid response called to assist. Please see her note.  Pt currently in bed with eyes closed. Will continue to monitor.Marland Kitchen

## 2018-10-14 NOTE — Progress Notes (Signed)
PROGRESS NOTE    Albert GoldsmithMark M Hess  ZOX:[id number] DOB: August 11, 1946 DOA: 10/11/2018 PCP: Wilfrid LundBecker, Anna G, PA   Brief Narrative:  HPI on 10/11/2018 by Dr. Midge MiniumArshad Kakrakandy Albert GoldsmithMark M Hess is a 73 y.o. male with history of alcohol abuse drinks vodka every day, diabetes mellitus type 2 on metformin, history of hypertension presently not on medication and history of bipolar disorder per the chart was brought to the ER the patient was found on the floor by patient's family member today.  Patient states he may have fallen last night and has remained on the floor since.  Patient states he does not recall exactly when he fell.  He drinks alcohol every day.  Denies any chest pain shortness of breath palpitation nausea vomiting abdominal pain or diarrhea.  Interim history Admitted for sycnope, atrial flutter with RVR.  He was also noted to have a periorbital hematoma and nasal bone fracture upon admission. Assessment & Plan   New Onset Atrial Flutter with RVR -likely precipitated by heavy alcohol use vs pneumonia vs electrolyte disturbances -Cardiology consulted and appreciated -Patient was placed on a Cardizem drip -Continue to replace magnesium, keep potassium around 4 -TSH 3.960, WNL -Currently on metoprolol 25mg  BID -Poor anticoagulation candidate given alcohol use.  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 -Pulmonology does not recommend bronchoscopy at this point, recommending outpatient repeat CT in 3 months  Syncope/Fall -Suspect secondary to atrial flutter in conjunction with alcohol -Trauma work-up was unremarkable except for nasal fracture -Continue supportive  care -CT chest negative for PE -Echocardiogram pending  -PT/OT consulted  Prolonged QT -Patient was given azithromycin which is now been discontinued -Continue to monitor closely -replace magnesium and monitor   Hypomagnesemia -pending labs today, was replaced on 10/13/2018   Alcohol dependence -Does not appear to have alcohol withdrawal -Drinks approximately half a gallon of vodka per day -Continue CIWA to call  Diabetes mellitus, type 2 -metformin held -Continue ISS and CBG monitoring  PTSD/bipolar disorder/schizophrenia -Previously was on Risperdal, quetiapine, but has been off of these medications for more than 1 year -Need to have outpatient follow-up  DVT Prophylaxis  SCDs  Code Status: Full  Family Communication: None at bedside  Disposition Plan: Admitted. Pending PT/OT consultations.   Consultants Pulmonology Cardiology  Procedures  None  Antibiotics   Anti-infectives (From admission, onward)   Start     Dose/Rate Route Frequency Ordered Stop   10/12/18 2100  azithromycin (ZITHROMAX) 500 mg in sodium chloride 0.9 % 250 mL IVPB  Status:  Discontinued     500 mg 250 mL/hr over 60 Minutes Intravenous Every 24 hours 10/11/18 2246 10/12/18 1810   10/12/18 2000  cefTRIAXone (ROCEPHIN) 1 g in sodium chloride 0.9 % 100 mL IVPB  Status:  Discontinued     1 g 200 mL/hr over 30 Minutes Intravenous Every 24 hours 10/11/18 2246 10/12/18 1810   10/12/18 1900  ampicillin-sulbactam (UNASYN) 1.5 g in sodium chloride 0.9 % 100 mL IVPB     1.5 g 200 mL/hr over 30 Minutes Intravenous Every 6 hours 10/12/18 1812     10/11/18 1930  levofloxacin (LEVAQUIN) IVPB 750 mg     750 mg 100 mL/hr over 90 Minutes Intravenous  Once 10/11/18 1923 10/11/18  2159      Subjective:   Albert Hess seen and examined today.  Patient denies current chest pain, shortness of breath, abdominal pain, nausea or vomiting, diarrhea or constipation, dizziness or headache.  Objective:   Vitals:    10/13/18 2134 10/14/18 0003 10/14/18 0443 10/14/18 0811  BP:  122/73 128/88 (!) 132/98  Pulse: 85 84 82 96  Resp:  (!) 23 (!) 24 19  Temp:   97.8 F (36.6 C)   TempSrc:   Oral   SpO2:   99% 100%  Weight:   116 kg   Height:        Intake/Output Summary (Last 24 hours) at 10/14/2018 1135 Last data filed at 10/14/2018 0845 Gross per 24 hour  Intake 1131.56 ml  Output 525 ml  Net 606.56 ml   Filed Weights   10/12/18 0419 10/13/18 0422 10/14/18 0443  Weight: 109.9 kg 115 kg 116 kg    Exam  General: Well developed, well nourished, NAD, appears stated age  HEENT: [place], extensive ecchymosis right periorbital-forehead, nasal bridge with Steri-Strips, mucous membranes moist.   Neck: Supple  Cardiovascular: S1 S2 auscultated, irregular rhythm, no murmur  Respiratory: Clear to auscultation bilaterally with equal chest rise  Abdomen: Soft, nontender, nondistended, + bowel sounds  Extremities: warm dry without cyanosis clubbing or edema  Neuro: AAOx3, nonfocal  Psych: Pleasant, appropriate mood and affect   Data Reviewed: I have personally reviewed following labs and imaging studies  CBC: Recent Labs  Lab 10/11/18 1551 10/12/18 0427  WBC 5.3 4.8  NEUTROABS 3.9 3.5  HGB 16.8 12.8*  HCT 50.0 36.2*  MCV 102.9* 99.7  PLT PLATELET CLUMPS NOTED ON SMEAR, UNABLE TO ESTIMATE 138*   Basic Metabolic Panel: Recent Labs  Lab 10/11/18 1552 10/11/18 2223 10/12/18 0427 10/12/18 1326 10/13/18 0640  NA 134*  --  135  --  135  K 4.5  --  3.9  --  3.8  CL 94*  --  99  --  102  CO2 20*  --  25  --  23  GLUCOSE 72  --  150*  --  134*  BUN 12  --  12  --  12  CREATININE 0.94  --  1.06  --  0.87  CALCIUM 9.0  --  7.7*  --  7.5*  MG  --  1.2*  --  1.1* 1.4*   GFR: Estimated Creatinine Clearance: 102.4 mL/min (by C-G formula based on SCr of 0.87 mg/dL). Liver Function Tests: Recent Labs  Lab 10/11/18 1552 10/12/18 0427  AST 93* 55*  ALT 60* 43  ALKPHOS 62 49  BILITOT 3.1*  2.0*  PROT 6.8 5.3*  ALBUMIN 3.7 2.8*   No results for input(s): LIPASE, AMYLASE in the last 168 hours. No results for input(s): AMMONIA in the last 168 hours. Coagulation Profile: No results for input(s): INR, PROTIME in the last 168 hours. Cardiac Enzymes: Recent Labs  Lab 10/11/18 1552 10/11/18 2223 10/12/18 0427 10/12/18 1027  CKTOTAL 287  --   --   --   TROPONINI  --  <0.03 <0.03 <0.03   BNP (last 3 results) No results for input(s): PROBNP in the last 8760 hours. HbA1C: No results for input(s): HGBA1C in the last 72 hours. CBG: Recent Labs  Lab 10/13/18 1200 10/13/18 1628 10/13/18 2144 10/14/18 0809 10/14/18 1123  GLUCAP 151* 148* 123* 111* 159*   Lipid Profile: No results for input(s): CHOL, HDL, LDLCALC, TRIG, CHOLHDL, LDLDIRECT in the last 72  hours. Thyroid Function Tests: Recent Labs    10/11/18 2223  TSH 3.960   Anemia Panel: No results for input(s): VITAMINB12, FOLATE, FERRITIN, TIBC, IRON, RETICCTPCT in the last 72 hours. Urine analysis:    Component Value Date/Time   COLORURINE AMBER (A) 10/12/2018 0549   APPEARANCEUR CLEAR 10/12/2018 0549   LABSPEC 1.032 (H) 10/12/2018 0549   PHURINE 5.0 10/12/2018 0549   GLUCOSEU 50 (A) 10/12/2018 0549   HGBUR SMALL (A) 10/12/2018 0549   BILIRUBINUR NEGATIVE 10/12/2018 0549   KETONESUR 20 (A) 10/12/2018 0549   PROTEINUR NEGATIVE 10/12/2018 0549   UROBILINOGEN 0.2 03/18/2013 0050   NITRITE NEGATIVE 10/12/2018 0549   LEUKOCYTESUR NEGATIVE 10/12/2018 0549   Sepsis Labs: @LABRCNTIP (procalcitonin:4,lacticidven:4)  ) Recent Results (from the past 240 hour(s))  Blood culture (routine x 2)     Status: None (Preliminary result)   Collection Time: 10/11/18 11:55 PM  Result Value Ref Range Status   Specimen Description BLOOD RIGHT FOREARM  Final   Special Requests   Final    BOTTLES DRAWN AEROBIC ONLY Blood Culture adequate volume   Culture   Final    NO GROWTH 2 DAYS Performed at [facility],  [address], [place], [place] [address]    Report Status PENDING  Incomplete  Blood culture (routine x 2)     Status: None (Preliminary result)   Collection Time: 10/12/18 12:05 AM  Result Value Ref Range Status   Specimen Description BLOOD RIGHT HAND  Final   Special Requests   Final    BOTTLES DRAWN AEROBIC ONLY Blood Culture adequate volume   Culture   Final    NO GROWTH 2 DAYS Performed at [facility], [address] [place], [place] [address]    Report Status PENDING  Incomplete      Radiology Studies: Dg Chest Port 1 View  Result Date: 10/14/2018 CLINICAL DATA:  Shortness of breath. Tachypnea. Paradoxical respiration. EXAM: PORTABLE CHEST 1 VIEW COMPARISON:  Radiograph and CT three days ago 10/11/2018 FINDINGS: The cardiomediastinal contours are normal. Thin-walled cyst in the right midlung measuring 6.8 x 6.7 cm is unchanged in the short interval. Ground-glass opacities on CT are not well visualized radiographically. Pulmonary vasculature is normal. No consolidation, pleural effusion, or pneumothorax. No acute osseous abnormalities are seen. IMPRESSION: 1. Unchanged radiographic appearance of the chest. Unchanged pulmonary cavitary lesion in the right midlung over the past 3 days. 2. Ground-glass opacities on CT have no radiographic correlate. Electronically Signed   By: Narda Rutherford M.D.   On: 10/14/2018 02:27     Scheduled Meds: . folic acid  1 mg Intravenous Daily  . insulin aspart  0-9 Units Subcutaneous TID WC  . LORazepam  1 mg Oral BID  . metoprolol tartrate  25 mg Oral BID  . thiamine  100 mg Oral Daily   Continuous Infusions: . ampicillin-sulbactam (UNASYN) IV 1.5 g (10/14/18 0745)     LOS: 2 days   Time Spent in minutes   30 minutes  Sherrod Toothman D.O. on 10/14/2018 at 11:35 AM  Between 7am to 7pm - Please see pager noted on [email or web address]  After 7pm go to [email or web address]  And look for the night coverage person covering for me after hours  [facility]  [phone]

## 2018-10-14 NOTE — Clinical Social Work Note (Signed)
CSW acknowledges SNF consult. Please place PT and OT orders for evaluations.  Charlynn Court, CSW [phone]

## 2018-10-14 NOTE — Progress Notes (Signed)
PHARMACIST - PHYSICIAN COMMUNICATION  DR:   Catha Gosselin  CONCERNING: IV to Oral Route Change Policy  RECOMMENDATION: This patient is receiving Folic acid by the intravenous route.  Based on criteria approved by the Pharmacy and Therapeutics Committee, the intravenous medication(s) is/are being converted to the equivalent oral dose form(s).   DESCRIPTION: These criteria include:  The patient is eating (either orally or via tube) and/or has been taking other orally administered medications for a least 24 hours  The patient has no evidence of active gastrointestinal bleeding or impaired GI absorption (gastrectomy, short bowel, patient on TNA or NPO).  If you have questions about this conversion, please contact the Pharmacy Department  []   [phone] )  [facility] []   [phone] )  [facility] [x]   [phone] )  [facility] []   [phone] )  [facility] []   [phone] )  [facility]   Tera Mater, [facility] 10/14/2018 1:36 PM

## 2018-10-14 NOTE — Progress Notes (Signed)
Progress Note  Patient Name: Albert Hess Date of Encounter: 10/14/2018  Primary Cardiologist: Dr. Antoine Poche (new)  Subjective   He developed some shortness of breath and tachycardia earlier this morning.  Rapid response was called.  He was given Xopenex and a chest x-ray was obtained which showed no acute findings.  He said "I feel fine" at the time of my evaluation. He specifically denies chest pain, palpitations, and shortness of breath.  Inpatient Medications    Scheduled Meds: . folic acid  1 mg Intravenous Daily  . insulin aspart  0-9 Units Subcutaneous TID WC  . LORazepam  1 mg Oral BID  . metoprolol tartrate  25 mg Oral BID  . thiamine  100 mg Oral Daily   Continuous Infusions: . ampicillin-sulbactam (UNASYN) IV 1.5 g (10/14/18 0745)   PRN Meds: acetaminophen **OR** acetaminophen, bacitracin, chlorhexidine, levalbuterol, LORazepam, ondansetron **OR** ondansetron (ZOFRAN) IV, sodium chloride flush   Vital Signs    Vitals:   10/13/18 2134 10/14/18 0003 10/14/18 0443 10/14/18 0811  BP:  122/73 128/88 (!) 132/98  Pulse: 85 84 82 96  Resp:  (!) 23 (!) 24 19  Temp:   97.8 F (36.6 C)   TempSrc:   Oral   SpO2:   99% 100%  Weight:   116 kg   Height:        Intake/Output Summary (Last 24 hours) at 10/14/2018 1008 Last data filed at 10/14/2018 0845 Gross per 24 hour  Intake 1131.56 ml  Output 525 ml  Net 606.56 ml   Filed Weights   10/12/18 0419 10/13/18 0422 10/14/18 0443  Weight: 109.9 kg 115 kg 116 kg    Telemetry    Currently in atrial flutter, 70 bpm range. Had been tachycardic earlier this morning (rapid atrial flutter) - Personally Reviewed  ECG    No new tracings - Personally Reviewed  Physical Exam   GEN: No acute distress.   Neck: No JVD Cardiac: Regular rate, irregular rhythm, no murmurs, rubs, or gallops.  Respiratory: Clear to auscultation bilaterally. GI: Soft, nontender, non-distended  MS: No edema; No deformity. Neuro:  Nonfocal    Psych: Normal affect   Labs    Chemistry Recent Labs  Lab 10/11/18 1552 10/12/18 0427 10/13/18 0640  NA 134* 135 135  K 4.5 3.9 3.8  CL 94* 99 102  CO2 20* 25 23  GLUCOSE 72 150* 134*  BUN 12 12 12   CREATININE 0.94 1.06 0.87  CALCIUM 9.0 7.7* 7.5*  PROT 6.8 5.3*  --   ALBUMIN 3.7 2.8*  --   AST 93* 55*  --   ALT 60* 43  --   ALKPHOS 62 49  --   BILITOT 3.1* 2.0*  --   GFRNONAA >60 >60 >60  GFRAA >60 >60 >60  ANIONGAP 20* 11 10     Hematology Recent Labs  Lab 10/11/18 1551 10/12/18 0427  WBC 5.3 4.8  RBC 4.86 3.63*  HGB 16.8 12.8*  HCT 50.0 36.2*  MCV 102.9* 99.7  MCH 34.6* 35.3*  MCHC 33.6 35.4  RDW 12.6 12.5  PLT PLATELET CLUMPS NOTED ON SMEAR, UNABLE TO ESTIMATE 138*    Cardiac Enzymes Recent Labs  Lab 10/11/18 2223 10/12/18 0427 10/12/18 1027  TROPONINI <0.03 <0.03 <0.03    Recent Labs  Lab 10/11/18 1642  TROPIPOC 0.02     BNPNo results for input(s): BNP, PROBNP in the last 168 hours.   DDimer No results for input(s): DDIMER in the last 168  hours.   Radiology    Dg Chest Port 1 View  Result Date: 10/14/2018 CLINICAL DATA:  Shortness of breath. Tachypnea. Paradoxical respiration. EXAM: PORTABLE CHEST 1 VIEW COMPARISON:  Radiograph and CT three days ago 10/11/2018 FINDINGS: The cardiomediastinal contours are normal. Thin-walled cyst in the right midlung measuring 6.8 x 6.7 cm is unchanged in the short interval. Ground-glass opacities on CT are not well visualized radiographically. Pulmonary vasculature is normal. No consolidation, pleural effusion, or pneumothorax. No acute osseous abnormalities are seen. IMPRESSION: 1. Unchanged radiographic appearance of the chest. Unchanged pulmonary cavitary lesion in the right midlung over the past 3 days. 2. Ground-glass opacities on CT have no radiographic correlate. Electronically Signed   By: Narda Rutherford M.D.   On: 10/14/2018 02:27    Cardiac Studies   Echo ordered and pending  Patient Profile      73 y.o. male w/ h/o heavy ETOH use, T2DM, HTN, HLD,bipolar disorder, Schizophrenia and PTSD brought to the ED after being found by family on the floor after a fall, sustained multiple lacerations, periorbital hematoma and nasal bone fracture, also found to be in atrial flutter w/ RVR and being treated for possible aspirationPNA. Cardiology consulted for atrial flutter, at the request of Dr. Lajuana Ripple, Internal Medicine.   Assessment & Plan    1. Atrial Flutter w/ RVR: in the setting of heavy ETOH use, hypomagnesemia and possible aspiration PNA. V-rates in the 140sinitially, now in the 51s.  TSH, potassium, and troponins are all normal.  He is very mildly anemic.  Magnesium was low yesterday at 1.4 today which I replaced. Heart rates controlled in 70 bpm range on Lopressor 25 mg twice daily. I ordered an echocardiogram to evaluate cardiac structure and function on 1/25 and it is pending. Poor anticoagulation candidate due to history of alcohol abuse, psychiatric issues, and increased risk for falls. He would have to prove that he can abstain from ETOH before we would consider long term anticoagulation.    2. Possible Aspiration PNA:Abx per IM.    3. Fall w/ Injury: in the setting of intoxication.Sustained multiple lacerations, periorbital hematoma and nasal bone fracture.  4. ETOH Abuse: HPI outlines that he drinks vodka daily.Pt reports he goes through 1/2 gallon every 3 days.ETOH level was elevated in the ED at 229.On CIWA protocol. Management per primary.   5. Hypomagnesemia: 1.2 on admit.  1.4 yesterday. I replaced on 1/25.  Likely 2/2 chronic ETOH use.   6. T2DM:management per IM.   7. HTN: Blood pressure is reasonably controlled.  8. Psychiatric Disorders: bipolar 2 disorder, schizophrenia and PTSD listed in medical record. Management per IM. Also heavy ETOH abuse. Would benefit fromcounseling.  9. Cystic Lesion: noted on chest CT. Pulmonology has  consulted.    For questions or updates, please contact [facility] Please consult [email or web address] for contact info under Cardiology/STEMI.      Signed, Prentice Docker, MD  10/14/2018, 10:08 AM

## 2018-10-15 ENCOUNTER — Inpatient Hospital Stay (HOSPITAL_COMMUNITY): Payer: Medicare Other

## 2018-10-15 DIAGNOSIS — I4892 Unspecified atrial flutter: Secondary | ICD-10-CM

## 2018-10-15 LAB — GLUCOSE, CAPILLARY
GLUCOSE-CAPILLARY: 141 mg/dL — AB (ref 70–99)
Glucose-Capillary: 116 mg/dL — ABNORMAL HIGH (ref 70–99)
Glucose-Capillary: 98 mg/dL (ref 70–99)
Glucose-Capillary: 122 mg/dL — ABNORMAL HIGH (ref 70–99)

## 2018-10-15 LAB — ECHOCARDIOGRAM COMPLETE
Height: 72 in
Weight: 4000.03 oz

## 2018-10-15 LAB — MAGNESIUM: Magnesium: 1.5 mg/dL — ABNORMAL LOW (ref 1.7–2.4)

## 2018-10-15 MED ORDER — MAGNESIUM SULFATE 2 GM/50ML IV SOLN
2.0000 g | Freq: Once | INTRAVENOUS | Status: AC
  Administered 2018-10-15: 2 g via INTRAVENOUS
  Filled 2018-10-15: qty 50

## 2018-10-15 NOTE — Progress Notes (Signed)
Progress Note  Patient Name: Albert Hess Date of Encounter: 10/15/2018  Primary Cardiologist: Rollene Rotunda, MD   Subjective   No further tachycardia on telemetry. Contiues to be asymptomatic  Inpatient Medications    Scheduled Meds: . folic acid  1 mg Oral Daily  . insulin aspart  0-9 Units Subcutaneous TID WC  . LORazepam  1 mg Oral BID  . metoprolol tartrate  25 mg Oral BID  . thiamine  100 mg Oral Daily   Continuous Infusions: . ampicillin-sulbactam (UNASYN) IV 1.5 g (10/15/18 0658)   PRN Meds: acetaminophen **OR** acetaminophen, bacitracin, chlorhexidine, levalbuterol, LORazepam, ondansetron **OR** ondansetron (ZOFRAN) IV, sodium chloride flush   Vital Signs    Vitals:   10/14/18 2013 10/15/18 0004 10/15/18 0416 10/15/18 0801  BP: 119/78 110/74 (!) 132/94 140/89  Pulse: 98 80 96 96  Resp: (!) 23 (!) 26 (!) 33 (!) 26  Temp: 98.8 F (37.1 C)  98 F (36.7 C) 98.4 F (36.9 C)  TempSrc: Oral  Oral Oral  SpO2: 99% 100% 100% 100%  Weight:   113.4 kg   Height:        Intake/Output Summary (Last 24 hours) at 10/15/2018 0823 Last data filed at 10/15/2018 0802 Gross per 24 hour  Intake 1562.93 ml  Output 926 ml  Net 636.93 ml   Last 3 Weights 10/15/2018 10/14/2018 10/13/2018  Weight (lbs) 250 lb 255 lb 11.7 oz 253 lb 8.5 oz  Weight (kg) 113.4 kg 116 kg 115 kg  Some encounter information is confidential and restricted. Go to Review Flowsheets activity to see all data.      Telemetry    Atrial fibrillation with controlled ventricular response- Personally Reviewed  ECG    No new EKG to review- Personally Reviewed  Physical Exam   GEN: No acute distress.   Neck: No JVD Cardiac:  Irregularly irregular, no murmurs, rubs, or gallops.  Respiratory: Clear to auscultation bilaterally. GI: Soft, nontender, non-distended  MS: No edema; No deformity. Neuro:  Nonfocal  Psych: Normal affect   Labs    Chemistry Recent Labs  Lab 10/11/18 1552 10/12/18 0427  10/13/18 0640 10/14/18 1232  NA 134* 135 135 134*  K 4.5 3.9 3.8 4.2  CL 94* 99 102 100  CO2 20* 25 23 28   GLUCOSE 72 150* 134* 141*  BUN 12 12 12 11   CREATININE 0.94 1.06 0.87 0.83  CALCIUM 9.0 7.7* 7.5* 8.2*  PROT 6.8 5.3*  --   --   ALBUMIN 3.7 2.8*  --   --   AST 93* 55*  --   --   ALT 60* 43  --   --   ALKPHOS 62 49  --   --   BILITOT 3.1* 2.0*  --   --   GFRNONAA >60 >60 >60 >60  GFRAA >60 >60 >60 >60  ANIONGAP 20* 11 10 6      Hematology Recent Labs  Lab 10/11/18 1551 10/12/18 0427  WBC 5.3 4.8  RBC 4.86 3.63*  HGB 16.8 12.8*  HCT 50.0 36.2*  MCV 102.9* 99.7  MCH 34.6* 35.3*  MCHC 33.6 35.4  RDW 12.6 12.5  PLT PLATELET CLUMPS NOTED ON SMEAR, UNABLE TO ESTIMATE 138*    Cardiac Enzymes Recent Labs  Lab 10/11/18 2223 10/12/18 0427 10/12/18 1027  TROPONINI <0.03 <0.03 <0.03    Recent Labs  Lab 10/11/18 1642  TROPIPOC 0.02     BNPNo results for input(s): BNP, PROBNP in the last 168 hours.  DDimer No results for input(s): DDIMER in the last 168 hours.   Radiology    Dg Chest Port 1 View  Result Date: 10/14/2018 CLINICAL DATA:  Shortness of breath. Tachypnea. Paradoxical respiration. EXAM: PORTABLE CHEST 1 VIEW COMPARISON:  Radiograph and CT three days ago 10/11/2018 FINDINGS: The cardiomediastinal contours are normal. Thin-walled cyst in the right midlung measuring 6.8 x 6.7 cm is unchanged in the short interval. Ground-glass opacities on CT are not well visualized radiographically. Pulmonary vasculature is normal. No consolidation, pleural effusion, or pneumothorax. No acute osseous abnormalities are seen. IMPRESSION: 1. Unchanged radiographic appearance of the chest. Unchanged pulmonary cavitary lesion in the right midlung over the past 3 days. 2. Ground-glass opacities on CT have no radiographic correlate. Electronically Signed   By: Narda Rutherford M.D.   On: 10/14/2018 02:27    Cardiac Studies   2D echo pending  Patient Profile     73 y.o.  male  w/ h/o heavy ETOH use, T2DM, HTN, HLD,bipolar disorder, Schizophrenia and PTSD brought to the ED after being found by family on the floor after a fall, sustained multiple lacerations, periorbital hematoma and nasal bone fracture, also found to be in atrial flutter w/ RVR and being treated for possible aspirationPNA. Cardiology consulted for atrial flutter.  Assessment & Plan    1. Atrial flutter with RVR - in the setting of heavy alcohol use, found down the floor after falling with multiple lacerations and periorbital hematoma as well as facial fractures -Magnesium was low on admission at 1.2 has been supplemented -Felt not to be an anticoagulation candidate at this time due to alcohol abuse, psychiatric issues increased risk of falls.  It was felt that he would have to prove that he could abstain from alcohol before considering long-term anticoagulation -Remains in atrial fibrillation on telemetry with very well controlled heart rate -Continue Lopressor 25 mg twice daily -2D echo pending  2.  Possible aspiration pneumonia -Currently on antibiotics per [facility]  3.  Fall with injury -This occurred in the setting of intoxication sustaining multiple lacerations, peri-orbital hematoma and nasal bone fracture  4.  Alcohol abuse -Apparently drinks vodka daily and goes through 1/2 gallon every 3 days -EtOH level was elevated to 29 in ER -Currently on CIWA protocol per [facility]  5.  Hypomagnesemia -Was 1.2 on admit but repleted -Up to 1.5 today. -give 2 g IV mag this morning -Repeat mag level in a.m.  6.  Hypertension -BP controlled this morning at 140/89 mmHg -Continue Lopressor 25 mg twice daily  7.  Psychiatric disorders -He has a history of bipolar disorder, schizophrenia and PTSD along with heavy alcohol use -Consider psych consult         For questions or updates, please contact [facility] Please consult [email or web address] for contact info under        Signed, Marcelino Duster, PA  10/15/2018, 8:23 AM

## 2018-10-15 NOTE — Evaluation (Signed)
Physical Therapy Evaluation Patient Details Name: Albert Hess MRN: [id number] DOB: 17-Feb-1946 Today's Date: 10/15/2018   History of Present Illness  73 y.o. male w/ h/o heavy ETOH use, T2DM, HTN, HLD, bipolar disorder, Schizophrenia and PTSD brought to the ED after being found by family on the floor after a fall, sustained multiple lacerations, periorbital hematoma and nasal bone fracture, also found to be in atrial flutter w/ RVR and being treated for possible aspiration PNA.     Clinical Impression  Pt admitted with above diagnosis. Pt currently with functional limitations due to the deficits listed below (see PT Problem List). PTA pt lived at home alone, independent. On eval, he required min assist bed mobility, mod assist sit to stand, and min assist +2 safety ambulation 5 feet with RW. Pt will benefit from skilled PT to increase their independence and safety with mobility to allow discharge to the venue listed below.       Follow Up Recommendations SNF;Supervision/Assistance - 24 hour    Equipment Recommendations  Other (comment)(TBD at next venue)    Recommendations for Other Services       Precautions / Restrictions Precautions Precautions: Fall;Other (comment) Precaution Comments: watch sats and RR      Mobility  Bed Mobility Overal bed mobility: Needs Assistance Bed Mobility: Supine to Sit;Sit to Supine     Supine to sit: Min assist;HOB elevated Sit to supine: Min assist;HOB elevated   General bed mobility comments: +rail, increased time and effort  Transfers Overall transfer level: Needs assistance Equipment used: Rolling walker (2 wheeled) Transfers: Sit to/from Stand Sit to Stand: Mod assist         General transfer comment: cues for hand placement, assist to power up. Initial stance without AD and pt very unsteady.   Ambulation/Gait Ambulation/Gait assistance: +2 safety/equipment;Min assist Gait Distance (Feet): 5 Feet Assistive device: Rolling walker  (2 wheeled) Gait Pattern/deviations: Step-through pattern;Decreased stride length Gait velocity: decreased   General Gait Details: unsteady gait. Assist to maintain balance.  Stairs            Wheelchair Mobility    Modified Rankin (Stroke Patients Only)       Balance Overall balance assessment: Needs assistance Sitting-balance support: No upper extremity supported;Feet supported Sitting balance-Leahy Scale: Good     Standing balance support: During functional activity;Bilateral upper extremity supported Standing balance-Leahy Scale: Poor Standing balance comment: reliant on UE support and external support                             Pertinent Vitals/Pain Pain Assessment: No/denies pain    Home Living Family/patient expects to be discharged to:: Private residence Living Arrangements: Alone   Type of Home: Apartment Home Access: Level entry     Home Layout: One level Home Equipment: None      Prior Function Level of Independence: Independent         Comments: Drives     Hand Dominance        Extremity/Trunk Assessment   Upper Extremity Assessment Upper Extremity Assessment: Defer to OT evaluation    Lower Extremity Assessment Lower Extremity Assessment: Generalized weakness       Communication   Communication: No difficulties  Cognition Arousal/Alertness: Awake/alert Behavior During Therapy: Flat affect Overall Cognitive Status: No family/caregiver present to determine baseline cognitive functioning Area of Impairment: Safety/judgement;Attention;Memory;Problem solving  Current Attention Level: Selective Memory: Decreased short-term memory   Safety/Judgement: Decreased awareness of safety;Decreased awareness of deficits   Problem Solving: Difficulty sequencing;Requires verbal cues General Comments: watch for DTs due to heavy alcohol use      General Comments General comments (skin integrity,  edema, etc.): Pt on 3 L O2 with SpO2 93% and increased WOB noted. RR 25    Exercises     Assessment/Plan    PT Assessment Patient needs continued PT services  PT Problem List Decreased balance;Decreased strength;Decreased cognition;Decreased knowledge of precautions;Decreased mobility;Decreased knowledge of use of DME;Decreased safety awareness;Decreased activity tolerance;Cardiopulmonary status limiting activity       PT Treatment Interventions DME instruction;Functional mobility training;Balance training;Patient/family education;Gait training;Therapeutic activities;Neuromuscular re-education;Therapeutic exercise;Cognitive remediation    PT Goals (Current goals can be found in the Care Plan section)  Acute Rehab PT Goals Patient Stated Goal: not stated PT Goal Formulation: With patient Time For Goal Achievement: 10/29/18 Potential to Achieve Goals: Good    Frequency Min 2X/week   Barriers to discharge Decreased caregiver support      Co-evaluation PT/OT/SLP Co-Evaluation/Treatment: Yes Reason for Co-Treatment: For patient/therapist safety;To address functional/ADL transfers PT goals addressed during session: Mobility/safety with mobility;Balance         AM-PAC PT "6 Clicks" Mobility  Outcome Measure Help needed turning from your back to your side while in a flat bed without using bedrails?: A Little Help needed moving from lying on your back to sitting on the side of a flat bed without using bedrails?: A Little Help needed moving to and from a bed to a chair (including a wheelchair)?: A Lot Help needed standing up from a chair using your arms (e.g., wheelchair or bedside chair)?: A Little Help needed to walk in hospital room?: A Lot Help needed climbing 3-5 steps with a railing? : A Lot 6 Click Score: 15    End of Session Equipment Utilized During Treatment: Gait belt;Oxygen Activity Tolerance: Patient limited by fatigue Patient left: in bed;with call bell/phone  within reach;with bed alarm set Nurse Communication: Mobility status PT Visit Diagnosis: Unsteadiness on feet (R26.81);Difficulty in walking, not elsewhere classified (R26.2);History of falling (Z91.81)    Time: 8338-2505 PT Time Calculation (min) (ACUTE ONLY): 17 min   Charges:   PT Evaluation $PT Eval Moderate Complexity: 1 Mod          Aida Raider, PT  Office # [phone] Pager [phone]   Ilda Foil 10/15/2018, 12:03 PM

## 2018-10-15 NOTE — Progress Notes (Signed)
  Echocardiogram 2D Echocardiogram has been performed.  Albert Hess 10/15/2018, 2:02 PM

## 2018-10-15 NOTE — Progress Notes (Signed)
CSW attempted to complete assessment with the patient. Patient deferred to speak with his daughter and have her make the decision.   CSW will attempt to call patient's daughter and complete the assessment.   CSW will continue to follow.   Drucilla Schmidt, MSW, LCSW-A Clinical Social Worker [employer]

## 2018-10-15 NOTE — NC FL2 (Addendum)
[place] MEDICAID FL2 LEVEL OF CARE SCREENING TOOL     IDENTIFICATION  Patient Name: Albert Hess Birthdate: [birth year] Sex: male Admission Date (Current Location): 10/11/2018  [facility] and [place] Number:  Producer, television/film/video and Address:  [facility] [place].  [facility], [address], [place], [place] [address]      Provider Number: [id number]  Attending Physician Name and Address:  Edsel Petrin, DO  Relative Name and Phone Number:  Carvel Getting, Daughter, [phone] & [name] Leisure, Son, [phone]    Current Level of Care: Hospital Recommended Level of Care: Skilled Nursing Facility Prior Approval Number: [id number]  Date Approved/Denied: 01/14/13 PASRR Number:   [id number] (valid 1/31 - 11/18/2018)   Discharge Plan: SNF    Current Diagnoses: Patient Active Problem List   Diagnosis Date Noted  . Cavitary lesion of lung   . ETOH abuse   . Atrial flutter with rapid ventricular response (HCC) 10/11/2018  . Laceration of nose 10/11/2018  . Aspiration pneumonia (HCC) 10/11/2018  . Alcohol abuse 10/11/2018  . Influenza A 10/14/2016  . Acute respiratory failure with hypoxia (HCC) 10/13/2016  . COPD exacerbation (HCC) 10/13/2016  . Essential hypertension 10/13/2016  . Diabetes mellitus without complication (HCC) 10/13/2016  . HLD (hyperlipidemia) 10/13/2016  . Tobacco abuse 10/13/2016  . Dehydration 03/16/2013  . AKI (acute kidney injury) (HCC) 03/16/2013  . Anemia 03/16/2013  . Thrombocytopenia (HCC) 03/16/2013  . Edema of both legs 01/14/2013  . Ulcer of foot (HCC) 12/29/2012  . Bipolar I disorder, most recent episode (or current) manic (HCC) 12/26/2012  . Hyponatremia 12/26/2012  . Diabetes (HCC) 12/26/2012  . 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 Bipolar I disorder (HCC) 12/22/2012  . Transient alteration of awareness 12/22/2012    Orientation RESPIRATION BLADDER Height & Weight     Self, Time, Situation, Place  Normal  Nasal cannula 3L  Incontinent(Has accidents sometimes) Weight: 250 lb (113.4 kg) Height:  6' (182.9 cm)  BEHAVIORAL SYMPTOMS/MOOD NEUROLOGICAL BOWEL NUTRITION STATUS      Continent Diet(Heart Healthy/Carb modified thin liquids)  AMBULATORY STATUS COMMUNICATION OF NEEDS Skin   Independent Verbally Skin abrasions, Bruising(Abrasion on head/hand; ecchymosis/skin tear on knee; ecchymosis on arm/face; rash on back/abdomen)                       Personal Care Assistance Level of Assistance  Bathing, Feeding, Dressing, Total care Bathing Assistance: Limited assistance Feeding assistance: Independent Dressing Assistance: Limited assistance Total Care Assistance: Limited assistance   Functional Limitations Info  Sight, Hearing, Speech Sight Info: Adequate Hearing Info: Adequate      SPECIAL CARE FACTORS FREQUENCY  PT (By licensed PT), OT (By licensed OT)     PT Frequency: 5x/wk OT Frequency: 5x/wk            Contractures Contractures Info: Not present    Additional Factors Info  Code Status, Allergies, Insulin Sliding Scale Code Status Info: Full Code Allergies Info:  No Known Allergies   Insulin Sliding Scale Info: insulin aspart novolog 0-9 units 3 x daily w/meals        Current Medications (10/15/2018):  This is the current hospital active medication list Current Facility-Administered Medications  Medication Dose Route Frequency Provider Last Rate Last Dose  . acetaminophen (TYLENOL) tablet 650 mg  650 mg Oral Q6H PRN Eduard Clos, MD       Or  . acetaminophen (TYLENOL) suppository 650 mg  650 mg Rectal Q6H PRN Eduard Clos, MD      .  ampicillin-sulbactam (UNASYN) 1.5 g in sodium chloride 0.9 % 100 mL IVPB  1.5 g Intravenous Q6H Kamineni, Neelima, MD 200 mL/hr at 10/15/18 1255 1.5 g at 10/15/18 1255  . bacitracin ointment   Topical PRN Alessandra Bevels, MD      . chlorhexidine (HIBICLENS) 4 % liquid   Topical Daily PRN Alessandra Bevels, MD      . folic acid (FOLVITE)  tablet 1 mg  1 mg Oral Daily Mervyn Gay A, RPH   1 mg at 10/15/18 6060  . insulin aspart (novoLOG) injection 0-9 Units  0-9 Units Subcutaneous TID WC Eduard Clos, MD   1 Units at 10/15/18 1252  . levalbuterol (XOPENEX) nebulizer solution 0.63 mg  0.63 mg Nebulization Q6H PRN Eduard Clos, MD   0.63 mg at 10/14/18 1811  . LORazepam (ATIVAN) injection 2-3 mg  2-3 mg Intravenous Q1H PRN Eduard Clos, MD      . LORazepam (ATIVAN) tablet 1 mg  1 mg Oral BID Canary Brim L, NP   1 mg at 10/15/18 0826  . metoprolol tartrate (LOPRESSOR) tablet 25 mg  25 mg Oral BID Laqueta Linden, MD   25 mg at 10/15/18 0826  . ondansetron (ZOFRAN) tablet 4 mg  4 mg Oral Q6H PRN Eduard Clos, MD       Or  . ondansetron [facility]) injection 4 mg  4 mg Intravenous Q6H PRN Eduard Clos, MD      . sodium chloride flush (NS) 0.9 % injection 10-40 mL  10-40 mL Intracatheter PRN Eduard Clos, MD      . thiamine (VITAMIN B-1) tablet 100 mg  100 mg Oral Daily Alessandra Bevels, MD   100 mg at 10/15/18 0459     Discharge Medications: Please see discharge summary for a list of discharge medications.  Relevant Imaging Results:  Relevant Lab Results:   Additional Information SSN: [id number]  Lorazepam (ativan) 1 mg 2x daily  Caitlin B Pinion, LCSWA

## 2018-10-15 NOTE — Evaluation (Signed)
Occupational Therapy Evaluation Patient Details Name: Albert Hess MRN: [id number] DOB: 01/15/1946 Today's Date: 10/15/2018    History of Present Illness 73 y.o. male w/ h/o heavy ETOH use, T2DM, HTN, HLD, bipolar disorder, Schizophrenia and PTSD brought to the ED after being found by family on the floor after a fall, sustained multiple lacerations, periorbital hematoma and nasal bone fracture, also found to be in atrial flutter w/ RVR and being treated for possible aspiration PNA.    Clinical Impression   Pt with decline in function and safety with ADLs and ADL mobility with decreased strength, balance and endurance. Pt with Poor awareness of deficits and safety. Pt very unsteady and required mod A + 2 for sit - stand and mobility. Pt requires max A with LB ADLs due to weakness and Poor balance. Pt would benefit from acute OT services to address impairments to maximize level of function and safety    Follow Up Recommendations  SNF;Supervision/Assistance - 24 hour    Equipment Recommendations  (TBD at next venue of care)    Recommendations for Other Services       Precautions / Restrictions Precautions Precautions: Fall;Other (comment) Precaution Comments: watch sats and RR Restrictions Weight Bearing Restrictions: No      Mobility Bed Mobility Overal bed mobility: Needs Assistance Bed Mobility: Supine to Sit;Sit to Supine     Supine to sit: Min assist;HOB elevated Sit to supine: Min assist;HOB elevated   General bed mobility comments: +rail, increased time and effort  Transfers Overall transfer level: Needs assistance Equipment used: Rolling walker (2 wheeled) Transfers: Sit to/from Stand Sit to Stand: Mod assist         General transfer comment: cues for hand placement, assist to power up. Initial stance without AD and pt very unsteady.     Balance Overall balance assessment: Needs assistance Sitting-balance support: No upper extremity supported;Feet  supported Sitting balance-Leahy Scale: Good     Standing balance support: During functional activity;Bilateral upper extremity supported Standing balance-Leahy Scale: Poor Standing balance comment: reliant on UE support and external support                           ADL either performed or assessed with clinical judgement   ADL Overall ADL's : Needs assistance/impaired     Grooming: Wash/dry hands;Wash/dry face;Min guard;Sitting   Upper Body Bathing: Min guard;Sitting   Lower Body Bathing: Maximal assistance;Sitting/lateral leans;Sit to/from stand   Upper Body Dressing : Min guard;Sitting   Lower Body Dressing: Maximal assistance;Sitting/lateral leans;Sit to/from stand   Toilet Transfer: Moderate assistance Toilet Transfer Details (indicate cue type and reason): simulated transfer to chair. pt stood to use urinal with +2 assist Toileting- Clothing Manipulation and Hygiene: Moderate assistance       Functional mobility during ADLs: Cueing for safety;Cueing for sequencing;Moderate assistance       Vision Baseline Vision/History: Wears glasses Wears Glasses: Reading only Patient Visual Report: No change from baseline       Perception     Praxis      Pertinent Vitals/Pain Pain Assessment: No/denies pain     Hand Dominance Right   Extremity/Trunk Assessment Upper Extremity Assessment Upper Extremity Assessment: Generalized weakness   Lower Extremity Assessment Lower Extremity Assessment: Defer to PT evaluation   Cervical / Trunk Assessment Cervical / Trunk Assessment: Normal   Communication Communication Communication: No difficulties   Cognition Arousal/Alertness: Awake/alert Behavior During Therapy: Flat affect Overall Cognitive Status: No family/caregiver  present to determine baseline cognitive functioning Area of Impairment: Safety/judgement;Attention;Memory;Problem solving                   Current Attention Level:  Selective Memory: Decreased short-term memory   Safety/Judgement: Decreased awareness of safety;Decreased awareness of deficits   Problem Solving: Difficulty sequencing;Requires verbal cues General Comments: watch for DTs due to heavy alcohol use   General Comments  Pt on 3 L O2 with SpO2 93% and increased WOB noted. RR 25    Exercises     Shoulder Instructions      Home Living Family/patient expects to be discharged to:: Private residence Living Arrangements: Alone   Type of Home: Apartment Home Access: Level entry     Home Layout: One level     Bathroom Shower/Tub: Chief Strategy OfficerTub/shower unit   Bathroom Toilet: Standard     Home Equipment: None          Prior Functioning/Environment Level of Independence: Independent        Comments: Drives        OT Problem List: Decreased strength;Decreased activity tolerance;Decreased cognition;Decreased knowledge of use of DME or AE;Impaired balance (sitting and/or standing);Decreased safety awareness;Decreased knowledge of precautions      OT Treatment/Interventions: Self-care/ADL training;DME and/or AE instruction;Therapeutic activities;Patient/family education    OT Goals(Current goals can be found in the care plan section) Acute Rehab OT Goals Patient Stated Goal: not stated OT Goal Formulation: With patient Time For Goal Achievement: 10/29/18 Potential to Achieve Goals: Good ADL Goals Pt Will Perform Grooming: with set-up;with supervision;sitting Pt Will Perform Upper Body Bathing: with supervision;with set-up;sitting Pt Will Perform Lower Body Bathing: with mod assist Pt Will Perform Upper Body Dressing: with supervision;with set-up Pt Will Transfer to Toilet: with min assist;ambulating;regular height toilet;grab bars Pt Will Perform Toileting - Clothing Manipulation and hygiene: with min assist;sit to/from stand  OT Frequency: Min 2X/week   Barriers to D/C: Decreased caregiver support          Co-evaluation  PT/OT/SLP Co-Evaluation/Treatment: Yes Reason for Co-Treatment: To address functional/ADL transfers;For patient/therapist safety PT goals addressed during session: Mobility/safety with mobility;Balance OT goals addressed during session: Proper use of Adaptive equipment and DME;ADL's and self-care      AM-PAC OT "6 Clicks" Daily Activity     Outcome Measure Help from another person eating meals?: None Help from another person taking care of personal grooming?: A Little Help from another person toileting, which includes using toliet, bedpan, or urinal?: A Lot Help from another person bathing (including washing, rinsing, drying)?: A Lot Help from another person to put on and taking off regular upper body clothing?: A Little Help from another person to put on and taking off regular lower body clothing?: A Lot 6 Click Score: 16   End of Session Equipment Utilized During Treatment: Gait belt;Rolling walker  Activity Tolerance: Patient limited by fatigue Patient left: in bed;with call bell/phone within reach;with bed alarm set  OT Visit Diagnosis: Unsteadiness on feet (R26.81);Other abnormalities of gait and mobility (R26.89);Muscle weakness (generalized) (M62.81);History of falling (Z91.81);Other symptoms and signs involving cognitive function                Time: 1040-1057 OT Time Calculation (min): 17 min Charges:  OT General Charges $OT Visit: 1 Visit OT Evaluation $OT Eval Moderate Complexity: 1 Mod    Galen ManilaSpencer, Katilin Raynes Jeanette 10/15/2018, 12:25 PM

## 2018-10-15 NOTE — Clinical Social Work Note (Signed)
Clinical Social Work Assessment  Patient Details  Name: Albert Hess MRN: [id number] Date of Birth: 1946/09/06  Date of referral:  10/15/18               Reason for consult:  Discharge Planning                Permission sought to share information with:  Case Manager Permission granted to share information::  Yes, Verbal Permission Granted  Name::     Albert Hess  Agency::  SNF  Relationship::  Daughter  Contact Information:     Housing/Transportation Living arrangements for the past 2 months:  Single Family Home Source of Information:  Patient, Adult Children Patient Interpreter Needed:  None Criminal Activity/Legal Involvement Pertinent to Current Situation/Hospitalization:  No - Comment as needed Significant Relationships:  Adult Children Lives with:  Self Do you feel safe going back to the place where you live?  No Need for family participation in patient care:  Yes (Comment)  Care giving concerns:    Patient from home alone. Patient is reliant on daughter to help make medical decisions for him. Patient's daughter wants him closer to where she currently lives so that she can help take better care of him.    Social Worker assessment / plan:    CSW first met with the patient at bedside. Patient was alert and seemed to understand what was being said to him. Patient deferred any decisions regarding his discharge planning to his daughter. He stated that he would be agreeable to what ever she decided for him.   CSW called and spoke with the patients daughter. She lives outside of [place] and is concerned for her fathers health and well-being. She stated that she found him home alone and he had fallen. She also stated that he was bleeding pretty heavily from the fall. She stated that her brother was their father's primary care taker, but he passed away back in 09-Jun-2023 and she has tried to visit him more often but she has a small child. She stated that she would like her father closer to her,  she stated that they were discussing moving him to an assisted living facility once he became more medically stable and released from the hospital. CSW explained that the physical therapist was recommending the patient participate in skilled nursing once he is released from the hospital.   CSW explained what the process was. CSW answered the daughter's questions. Patient's daughter stated that he has VA benefits but is not sure of where he receives his care. She decided she would like to proceed with the patient's blue cross blue shield benefits. She wants her father closer to her if at all possible. CSW stated that she could fax her father out to facilities in [place] and [place]. CSW sent the patient's daughter a SNF list to her e-mail. Her e-mail is [email or web address]. She is planning on coming to the hospital to visit with her father on Wednesday.   Employment status:  Retired Nurse, adult PT Recommendations:  [place] / Referral to community resources:  [place]  Patient/Family's Response to care:  Patient is accepting of his condition but also is reliant on his daughter to help make medical decisions for him.   Patient/Family's Understanding of and Emotional Response to Diagnosis, Current Treatment, and Prognosis:  Patient's daughter reported that she was feeling very stressed out and depressed. She lost her brother back in June 09, 2023 and he helped  care for their father. She needs some additional support. She did not identify any.   Emotional Assessment Appearance:  Appears stated age Attitude/Demeanor/Rapport:  Unable to Assess Affect (typically observed):  Unable to Assess Orientation:  Oriented to Self, Oriented to  Time, Oriented to Place, Oriented to Situation Alcohol / Substance use:  Not Applicable Psych involvement (Current and /or in the community):  No (Comment)  Discharge Needs  Concerns to be addressed:   Discharge Planning Concerns Readmission within the last 30 days:  No Current discharge risk:  Lives alone, Dependent with Mobility Barriers to Discharge:  Ship broker, Continued Medical Work up   [employer], [place] 10/15/2018, 5:59 PM

## 2018-10-15 NOTE — Progress Notes (Signed)
PROGRESS NOTE    Albert Hess  TAV:[id number] DOB: August 08, 1946 DOA: 10/11/2018 PCP: Wilfrid Lund, PA   Brief Narrative:  HPI on 10/11/2018 by Dr. Midge Minium Albert Hess is a 73 y.o. male with history of alcohol abuse drinks vodka every day, diabetes mellitus type 2 on metformin, history of hypertension presently not on medication and history of bipolar disorder per the chart was brought to the ER the patient was found on the floor by patient's family member today.  Patient states he may have fallen last night and has remained on the floor since.  Patient states he does not recall exactly when he fell.  He drinks alcohol every day.  Denies any chest pain shortness of breath palpitation nausea vomiting abdominal pain or diarrhea.  Interim history Admitted for sycnope, atrial flutter with RVR.  He was also noted to have a periorbital hematoma and nasal bone fracture upon admission. Assessment & Plan   New Onset Atrial Flutter with RVR -likely precipitated by heavy alcohol use vs pneumonia vs electrolyte disturbances -Cardiology consulted and appreciated -Patient was placed on a Cardizem drip -Continue to replace magnesium around 2 and keep potassium around 4 -TSH 3.960, WNL -Currently on metoprolol 25mg  BID -Poor anticoagulation candidate given alcohol use.  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 -Pulmonology does not recommend bronchoscopy at this point, recommending outpatient repeat CT in 3 months  Syncope/Fall -Suspect secondary to atrial flutter in conjunction with alcohol -Trauma work-up was unremarkable except for nasal fracture -Continue  supportive care -CT chest negative for PE -Echocardiogram still pending  -PT/OT consulted, pending eval  Prolonged QT -Patient was given azithromycin which is now been discontinued -Continue to monitor closely -Continue to replace magnesium and monitor   Hypomagnesemia -Mag 1.5, being replaced -continue to monitor   Alcohol dependence -Does not appear to have alcohol withdrawal -Drinks approximately half a gallon of vodka per day -Continue CIWA protocol  Diabetes mellitus, type 2 -metformin held -Continue ISS and CBG monitoring  PTSD/bipolar disorder/schizophrenia -Previously was on Risperdal, quetiapine, but has been off of these medications for more than 1 year -Need to have outpatient follow-up  DVT Prophylaxis  SCDs  Code Status: Full  Family Communication: None at bedside  Disposition Plan: Admitted. Pending PT/OT consultations, echocardiogram. Dispo TBD  Consultants Pulmonology Cardiology  Procedures  None  Antibiotics   Anti-infectives (From admission, onward)   Start     Dose/Rate Route Frequency Ordered Stop   10/12/18 2100  azithromycin (ZITHROMAX) 500 mg in sodium chloride 0.9 % 250 mL IVPB  Status:  Discontinued     500 mg 250 mL/hr over 60 Minutes Intravenous Every 24 hours 10/11/18 2246 10/12/18 1810   10/12/18 2000  cefTRIAXone (ROCEPHIN) 1 g in sodium chloride 0.9 % 100 mL IVPB  Status:  Discontinued     1 g 200 mL/hr over 30 Minutes Intravenous Every 24 hours 10/11/18 2246 10/12/18 1810   10/12/18 1900  ampicillin-sulbactam (UNASYN) 1.5 g in sodium chloride 0.9 % 100 mL IVPB     1.5 g 200 mL/hr over 30 Minutes Intravenous Every 6 hours 10/12/18 1812     10/11/18 1930  levofloxacin (LEVAQUIN) IVPB 750 mg     750 mg 100 mL/hr  over 90 Minutes Intravenous  Once 10/11/18 1923 10/11/18 2159      Subjective:   Keigen Hess seen and examined today.  Patient has no complaints this morning.  Denies current chest pain, shortness of breath, abdominal  pain, nausea or vomiting, diarrhea or constipation, dizziness or headache.  Objective:   Vitals:   10/15/18 0004 10/15/18 0416 10/15/18 0801 10/15/18 1132  BP: 110/74 (!) 132/94 140/89 132/80  Pulse: 80 96 96 96  Resp: (!) 26 (!) 33 (!) 26 (!) 24  Temp:  98 F (36.7 C) 98.4 F (36.9 C) 97.8 F (36.6 C)  TempSrc:  Oral Oral Axillary  SpO2: 100% 100% 100% 99%  Weight:  113.4 kg    Height:        Intake/Output Summary (Last 24 hours) at 10/15/2018 1134 Last data filed at 10/15/2018 0802 Gross per 24 hour  Intake 911.37 ml  Output 701 ml  Net 210.37 ml   Filed Weights   10/13/18 0422 10/14/18 0443 10/15/18 0416  Weight: 115 kg 116 kg 113.4 kg   Exam  General: Well developed, well nourished, NAD, appears stated age  HEENT: [place], bruising and ecchymosis on right periorbital, forehead areas, nasal bridge with Steri-Strips in place mucous membranes moist.   Neck: Supple  Cardiovascular: S1 S2 auscultated, irregular rhythm, no murmur  Respiratory: Clear to auscultation bilaterally with equal chest rise  Abdomen: Soft, nontender, nondistended, + bowel sounds  Extremities: warm dry without cyanosis clubbing or edema  Neuro: AAOx3, nonfocal  Psych: Pleasant, appropriate mood and affect  Data Reviewed: I have personally reviewed following labs and imaging studies  CBC: Recent Labs  Lab 10/11/18 1551 10/12/18 0427  WBC 5.3 4.8  NEUTROABS 3.9 3.5  HGB 16.8 12.8*  HCT 50.0 36.2*  MCV 102.9* 99.7  PLT PLATELET CLUMPS NOTED ON SMEAR, UNABLE TO ESTIMATE 138*   Basic Metabolic Panel: Recent Labs  Lab 10/11/18 1552 10/11/18 2223 10/12/18 0427 10/12/18 1326 10/13/18 0640 10/14/18 1232 10/15/18 0335  NA 134*  --  135  --  135 134*  --   K 4.5  --  3.9  --  3.8 4.2  --   CL 94*  --  99  --  102 100  --   CO2 20*  --  25  --  23 28  --   GLUCOSE 72  --  150*  --  134* 141*  --   BUN 12  --  12  --  12 11  --   CREATININE 0.94  --  1.06  --  0.87 0.83  --   CALCIUM  9.0  --  7.7*  --  7.5* 8.2*  --   MG  --  1.2*  --  1.1* 1.4*  --  1.5*   GFR: Estimated Creatinine Clearance: 106.1 mL/min (by C-G formula based on SCr of 0.83 mg/dL). Liver Function Tests: Recent Labs  Lab 10/11/18 1552 10/12/18 0427  AST 93* 55*  ALT 60* 43  ALKPHOS 62 49  BILITOT 3.1* 2.0*  PROT 6.8 5.3*  ALBUMIN 3.7 2.8*   No results for input(s): LIPASE, AMYLASE in the last 168 hours. No results for input(s): AMMONIA in the last 168 hours. Coagulation Profile: No results for input(s): INR, PROTIME in the last 168 hours. Cardiac Enzymes: Recent Labs  Lab 10/11/18 1552 10/11/18 2223 10/12/18 0427 10/12/18 1027  CKTOTAL 287  --   --   --   TROPONINI  --  <0.03 <0.03 <0.03  BNP (last 3 results) No results for input(s): PROBNP in the last 8760 hours. HbA1C: No results for input(s): HGBA1C in the last 72 hours. CBG: Recent Labs  Lab 10/14/18 0809 10/14/18 1123 10/14/18 1618 10/14/18 2144 10/15/18 0800  GLUCAP 111* 159* 122* 131* 116*   Lipid Profile: No results for input(s): CHOL, HDL, LDLCALC, TRIG, CHOLHDL, LDLDIRECT in the last 72 hours. Thyroid Function Tests: No results for input(s): TSH, T4TOTAL, FREET4, T3FREE, THYROIDAB in the last 72 hours. Anemia Panel: No results for input(s): VITAMINB12, FOLATE, FERRITIN, TIBC, IRON, RETICCTPCT in the last 72 hours. Urine analysis:    Component Value Date/Time   COLORURINE AMBER (A) 10/12/2018 0549   APPEARANCEUR CLEAR 10/12/2018 0549   LABSPEC 1.032 (H) 10/12/2018 0549   PHURINE 5.0 10/12/2018 0549   GLUCOSEU 50 (A) 10/12/2018 0549   HGBUR SMALL (A) 10/12/2018 0549   BILIRUBINUR NEGATIVE 10/12/2018 0549   KETONESUR 20 (A) 10/12/2018 0549   PROTEINUR NEGATIVE 10/12/2018 0549   UROBILINOGEN 0.2 03/18/2013 0050   NITRITE NEGATIVE 10/12/2018 0549   LEUKOCYTESUR NEGATIVE 10/12/2018 0549   Sepsis Labs: @LABRCNTIP (procalcitonin:4,lacticidven:4)  ) Recent Results (from the past 240 hour(s))  Blood culture  (routine x 2)     Status: None (Preliminary result)   Collection Time: 10/11/18 11:55 PM  Result Value Ref Range Status   Specimen Description BLOOD RIGHT FOREARM  Final   Special Requests   Final    BOTTLES DRAWN AEROBIC ONLY Blood Culture adequate volume   Culture   Final    NO GROWTH 2 DAYS Performed at [facility], [address] [place], [place] [address]    Report Status PENDING  Incomplete  Blood culture (routine x 2)     Status: None (Preliminary result)   Collection Time: 10/12/18 12:05 AM  Result Value Ref Range Status   Specimen Description BLOOD RIGHT HAND  Final   Special Requests   Final    BOTTLES DRAWN AEROBIC ONLY Blood Culture adequate volume   Culture   Final    NO GROWTH 2 DAYS Performed at [facility], [address] [place], [place] [address]    Report Status PENDING  Incomplete      Radiology Studies: Dg Chest Port 1 View  Result Date: 10/14/2018 CLINICAL DATA:  Shortness of breath. Tachypnea. Paradoxical respiration. EXAM: PORTABLE CHEST 1 VIEW COMPARISON:  Radiograph and CT three days ago 10/11/2018 FINDINGS: The cardiomediastinal contours are normal. Thin-walled cyst in the right midlung measuring 6.8 x 6.7 cm is unchanged in the short interval. Ground-glass opacities on CT are not well visualized radiographically. Pulmonary vasculature is normal. No consolidation, pleural effusion, or pneumothorax. No acute osseous abnormalities are seen. IMPRESSION: 1. Unchanged radiographic appearance of the chest. Unchanged pulmonary cavitary lesion in the right midlung over the past 3 days. 2. Ground-glass opacities on CT have no radiographic correlate. Electronically Signed   By: Narda RutherfordMelanie  Sanford M.D.   On: 10/14/2018 02:27     Scheduled Meds: . folic acid  1 mg Oral Daily  . insulin aspart  0-9 Units Subcutaneous TID WC  . LORazepam  1 mg Oral BID  . metoprolol tartrate  25 mg Oral BID  . thiamine  100 mg Oral Daily   Continuous Infusions: .  ampicillin-sulbactam (UNASYN) IV 1.5 g (10/15/18 0658)     LOS: 3 days   Time Spent in minutes   30 minutes  Takiyah Bohnsack D.O. on 10/15/2018 at 11:34 AM  Between 7am to 7pm -  Please see pager noted on [email or web address]  After 7pm go to [email or web address]  And look for the night coverage person covering for me after hours  [facility] Office  [phone]

## 2018-10-15 NOTE — Progress Notes (Signed)
CSW called and attempted to complete assessment with the patients daughter. Patients daughter was just pulling into her home and said that her child was crying. She stated that she just needed five minutes to get inside and get situated. CSW informed her that she could call back at any time at [phone].   CSW has not heard back.   Drucilla Schmidt, MSW, LCSW-A Clinical Social Worker [employer]

## 2018-10-15 NOTE — Care Management Important Message (Signed)
Important Message  Patient Details  Name: Albert Hess MRN: [id number] Date of Birth: 03/29/1946   Medicare Important Message Given:  Yes    Oralia Rud Yetunde Leis 10/15/2018, 12:15 PM

## 2018-10-16 ENCOUNTER — Inpatient Hospital Stay (HOSPITAL_COMMUNITY): Payer: Medicare Other

## 2018-10-16 LAB — GLUCOSE, CAPILLARY
GLUCOSE-CAPILLARY: 112 mg/dL — AB (ref 70–99)
Glucose-Capillary: 105 mg/dL — ABNORMAL HIGH (ref 70–99)
Glucose-Capillary: 117 mg/dL — ABNORMAL HIGH (ref 70–99)
Glucose-Capillary: 132 mg/dL — ABNORMAL HIGH (ref 70–99)

## 2018-10-16 LAB — HEMOGLOBIN AND HEMATOCRIT, BLOOD
HCT: 37 % — ABNORMAL LOW (ref 39.0–52.0)
Hemoglobin: 12.8 g/dL — ABNORMAL LOW (ref 13.0–17.0)

## 2018-10-16 LAB — BASIC METABOLIC PANEL
Anion gap: 10 (ref 5–15)
BUN: 11 mg/dL (ref 8–23)
CHLORIDE: 96 mmol/L — AB (ref 98–111)
CO2: 27 mmol/L (ref 22–32)
Calcium: 8.3 mg/dL — ABNORMAL LOW (ref 8.9–10.3)
Creatinine, Ser: 0.78 mg/dL (ref 0.61–1.24)
GFR calc Af Amer: >60 mL/min (ref >60)
GFR calc non Af Amer: >60 mL/min (ref >60)
Glucose, Bld: 105 mg/dL — ABNORMAL HIGH (ref 70–99)
POTASSIUM: 4.7 mmol/L (ref 3.5–5.1)
Sodium: 133 mmol/L — ABNORMAL LOW (ref 135–145)

## 2018-10-16 LAB — MAGNESIUM: Magnesium: 1.5 mg/dL — ABNORMAL LOW (ref 1.7–2.4)

## 2018-10-16 MED ORDER — MAGNESIUM SULFATE 2 GM/50ML IV SOLN: 2.0000 g | Freq: Once | INTRAVENOUS | Status: DC

## 2018-10-16 MED ORDER — METOPROLOL TARTRATE 50 MG PO TABS
50.0000 mg | ORAL_TABLET | Freq: Two times a day (BID) | ORAL | Status: DC
  Administered 2018-10-16 – 2018-10-17 (×3): 50 mg via ORAL
  Filled 2018-10-16 (×3): qty 1

## 2018-10-16 MED ORDER — MAGNESIUM SULFATE 4 GM/100ML IV SOLN
4.0000 g | Freq: Once | INTRAVENOUS | Status: AC
  Administered 2018-10-16: 4 g via INTRAVENOUS
  Filled 2018-10-16: qty 100

## 2018-10-16 NOTE — Progress Notes (Addendum)
Progress Note  Patient Name: Albert Hess Date of Encounter: 10/16/2018  Primary Cardiologist: Rollene Rotunda, MD   Subjective   Denies any SOB or CP.  Currently in atrial fibrillation with RVR at 124 bpm  Inpatient Medications    Scheduled Meds: . folic acid  1 mg Oral Daily  . insulin aspart  0-9 Units Subcutaneous TID WC  . LORazepam  1 mg Oral BID  . metoprolol tartrate  25 mg Oral BID  . thiamine  100 mg Oral Daily   Continuous Infusions: . ampicillin-sulbactam (UNASYN) IV 1.5 g (10/16/18 0558)   PRN Meds: acetaminophen **OR** acetaminophen, bacitracin, chlorhexidine, levalbuterol, LORazepam, ondansetron **OR** ondansetron (ZOFRAN) IV, sodium chloride flush   Vital Signs    Vitals:   10/15/18 2353 10/16/18 0548 10/16/18 0549 10/16/18 0742  BP: 122/72 (!) 145/63 (!) 142/89 (!) 143/86  Pulse: 96 (!) 110 (!) 111 (!) 110  Resp: (!) 21  (!) 24 (!) 31  Temp: 97.7 F (36.5 C)  97.9 F (36.6 C)   TempSrc:      SpO2:  97% 97% 95%  Weight:   115.5 kg   Height:        Intake/Output Summary (Last 24 hours) at 10/16/2018 0826 Last data filed at 10/16/2018 0500 Gross per 24 hour  Intake 772.07 ml  Output 375 ml  Net 397.07 ml   Filed Weights   10/14/18 0443 10/15/18 0416 10/16/18 0549  Weight: 116 kg 113.4 kg 115.5 kg    Telemetry    Atrial fibrillation with RVR at 124 bpm- Personally Reviewed  ECG    No new EKG to review - Personally Reviewed  Physical Exam   GEN: No acute distress.   Neck: No JVD Cardiac:  Irregularly irregular, no murmurs, rubs, or gallops.  Respiratory: Clear to auscultation bilaterally. GI: Soft, nontender, non-distended  MS: No edema; No deformity. Neuro:  Nonfocal  Psych: Normal affect   Labs    Chemistry Recent Labs  Lab 10/11/18 1552 10/12/18 0427 10/13/18 0640 10/14/18 1232 10/16/18 0553  NA 134* 135 135 134* 133*  K 4.5 3.9 3.8 4.2 4.7  CL 94* 99 102 100 96*  CO2 20* 25 23 28 27   GLUCOSE 72 150* 134* 141* 105*    BUN 12 12 12 11 11   CREATININE 0.94 1.06 0.87 0.83 0.78  CALCIUM 9.0 7.7* 7.5* 8.2* 8.3*  PROT 6.8 5.3*  --   --   --   ALBUMIN 3.7 2.8*  --   --   --   AST 93* 55*  --   --   --   ALT 60* 43  --   --   --   ALKPHOS 62 49  --   --   --   BILITOT 3.1* 2.0*  --   --   --   GFRNONAA >60 >60 >60 >60 >60  GFRAA >60 >60 >60 >60 >60  ANIONGAP 20* 11 10 6 10      Hematology Recent Labs  Lab 10/11/18 1551 10/12/18 0427 10/16/18 0553  WBC 5.3 4.8  --   RBC 4.86 3.63*  --   HGB 16.8 12.8* 12.8*  HCT 50.0 36.2* 37.0*  MCV 102.9* 99.7  --   MCH 34.6* 35.3*  --   MCHC 33.6 35.4  --   RDW 12.6 12.5  --   PLT PLATELET CLUMPS NOTED ON SMEAR, UNABLE TO ESTIMATE 138*  --     Cardiac Enzymes Recent Labs  Lab 10/11/18  2223 10/12/18 0427 10/12/18 1027  TROPONINI <0.03 <0.03 <0.03    Recent Labs  Lab 10/11/18 1642  TROPIPOC 0.02     BNPNo results for input(s): BNP, PROBNP in the last 168 hours.   DDimer No results for input(s): DDIMER in the last 168 hours.   Radiology    No results found.  Cardiac Studies   2D echo 10/15/2018 Study Conclusions  - Left ventricle: The cavity size was normal. Wall thickness was   normal. Systolic function was normal. The estimated ejection   fraction was in the range of 55% to 60%. Wall motion was normal;   there were no regional wall motion abnormalities. The study is   not technically sufficient to allow evaluation of LV diastolic   function.  Patient Profile     73 y.o. male w/ h/o heavy ETOH use, T2DM, HTN, HLD,bipolar disorder, Schizophrenia and PTSD brought to the ED after being found by family on the floor after a fall, sustained multiple lacerations, periorbital hematoma and nasal bone fracture, also found to be in atrial flutter w/ RVR and being treated for possible aspirationPNA  Assessment & Plan    1. Atrial flutter with RVR - in the setting of heavy alcohol use, found down the floor after falling with multiple  lacerations and periorbital hematoma as well as facial fractures -Magnesium was low on admission at 1.2 has been supplemented but remains at 1.5. -Not an anticoagulation candidate at this time due to alcohol abuse, psychiatric issues increased risk of falls.  He would have to prove that he could abstain from alcohol before considering long-term anticoagulation -Now in atrial fibrillation with RVR to 124 bpm -Increase Lopressor to 50 mg twice daily -2D echo with normal LV function  2.  Possible aspiration pneumonia -Currently on antibiotics per [facility]  3.  Fall with injury -This occurred in the setting of intoxication sustaining multiple lacerations, peri-orbital hematoma and nasal bone fracture  4.  Alcohol abuse -Apparently drinks vodka daily and goes through 1/2 gallon every 3 days -EtOH level was elevated to 29 in ER -Currently on CIWA protocol per [facility]  5.  Hypomagnesemia -Was 1.2 on admit but repleted -Remains 1.5 today despite 2 g of magnesium yesterday -continue to replete per TR[facility]  6.  Hypertension -BP controlled this morning at 143/86 mmHg -Continue Lopressor 25 mg twice daily  7.  Psychiatric disorders -He has a history of bipolar disorder, schizophrenia and PTSD along with heavy alcohol use -Consider psych consult   For questions or updates, please contact [facility] Please consult [email or web address] for contact info under Cardiology/STEMI.      Signed, Armanda Magic, MD  10/16/2018, 8:26 AM

## 2018-10-16 NOTE — Progress Notes (Signed)
PROGRESS NOTE    Albert GoldsmithMark M Hess  ZOX:[id number] DOB: 1946-03-07 DOA: 10/11/2018 PCP: Wilfrid LundBecker, Anna G, PA   Brief Narrative:  HPI on 10/11/2018 by Dr. Midge MiniumArshad Kakrakandy Albert GoldsmithMark M Hess is a 73 y.o. male with history of alcohol abuse drinks vodka every day, diabetes mellitus type 2 on metformin, history of hypertension presently not on medication and history of bipolar disorder per the chart was brought to the ER the patient was found on the floor by patient's family member today.  Patient states he may have fallen last night and has remained on the floor since.  Patient states he does not recall exactly when he fell.  He drinks alcohol every day.  Denies any chest pain shortness of breath palpitation nausea vomiting abdominal pain or diarrhea.  Interim history Admitted for sycnope, atrial flutter with RVR.  He was also noted to have a periorbital hematoma and nasal bone fracture upon admission. Assessment & Plan   New Onset Atrial Flutter with RVR -likely precipitated by heavy alcohol use vs pneumonia vs electrolyte disturbances -Cardiology consulted and appreciated -Patient was placed on a Cardizem drip -Continue to replace magnesium around 2 and keep potassium around 4 -TSH 3.960, WNL -Currently on metoprolol 25mg  BID- increased to 50mg  BID by cardiology due to RVR today -Echocardiogram EF 55-60%, no regional wall motion abnormalities  -Poor anticoagulation candidate given alcohol use.  Patient will need to abstain from alcohol prior to consideration of long-term anticoagulation  Patchy infiltrates with chronic right cavitary lung lesion/Possible Asp pneumonia -Pulmonology consulted and appreciated -Was initially placed on azithromycin and ceftriaxone, transitioned to Unasyn for aspiration coverage. 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 4-6 weeks of antibiotics  -Repeat CXR reviewed, still shows RLL cavitary lesion  Syncope/Fall -Suspect secondary to atrial flutter in conjunction with alcohol -Trauma work-up was unremarkable except for nasal fracture -Continue supportive care -CT chest negative for PE -Echocardiogram as above -PT/OT consulted, recommended SNF  Prolonged QT -Patient was given azithromycin which is now been discontinued -Continue to monitor closely -Continue to replace magnesium and monitor   Hypomagnesemia -Mag 1.5, will replace again today -continue to monitor   Alcohol dependence -Does not appear to have alcohol withdrawal -Drinks approximately half a gallon of vodka per day -Continue CIWA protocol  Diabetes mellitus, type 2 -metformin held -Continue ISS and CBG monitoring  PTSD/bipolar disorder/schizophrenia -Previously was on Risperdal, quetiapine, but has been off of these medications for more than 1 year -Need to have outpatient follow-up  DVT Prophylaxis  SCDs  Code Status: Full  Family Communication: None at bedside  Disposition Plan: Admitted. Pending cardio recommendations and SNF placement  Consultants Pulmonology Cardiology  Procedures  Echocardiogram  Antibiotics   Anti-infectives (From admission, onward)   Start     Dose/Rate Route Frequency Ordered Stop   10/12/18 2100  azithromycin (ZITHROMAX) 500 mg in sodium chloride 0.9 % 250 mL IVPB  Status:  Discontinued     500 mg 250 mL/hr over 60 Minutes Intravenous Every 24 hours 10/11/18 2246 10/12/18 1810   10/12/18 2000  cefTRIAXone (ROCEPHIN) 1 g in sodium chloride 0.9 % 100 mL IVPB  Status:  Discontinued     1 g 200 mL/hr over 30 Minutes Intravenous Every 24 hours 10/11/18 2246 10/12/18 1810   10/12/18 1900  ampicillin-sulbactam (UNASYN) 1.5 g in sodium chloride 0.9 % 100 mL  IVPB     1.5 g 200 mL/hr over 30 Minutes Intravenous Every 6 hours 10/12/18 1812     10/11/18 1930  levofloxacin (LEVAQUIN) IVPB  750 mg     750 mg 100 mL/hr over 90 Minutes Intravenous  Once 10/11/18 1923 10/11/18 2159      Subjective:   Albert Hess seen and examined today.  Patient complains of shortness of breath occassionally.  Denies current chest pain, abdominal pain, N/V/D/C.   Objective:   Vitals:   10/16/18 0742 10/16/18 0945 10/16/18 0948 10/16/18 1139  BP: (!) 143/86 (!) 161/97 (!) 157/95 (!) 142/88  Pulse: (!) 110 (!) 122 (!) 50 (!) 105  Resp: (!) 31 (!) 22 (!) 22 20  Temp:      TempSrc:    Oral  SpO2: 95% 94% 92% 98%  Weight:      Height:        Intake/Output Summary (Last 24 hours) at 10/16/2018 1407 Last data filed at 10/16/2018 1136 Gross per 24 hour  Intake 1012.07 ml  Output 675 ml  Net 337.07 ml   Filed Weights   10/14/18 0443 10/15/18 0416 10/16/18 0549  Weight: 116 kg 113.4 kg 115.5 kg   Exam  General: Well developed, well nourished, NAD, appears stated age  HEENT: [place],  Bruising and ecchymosis on right periorbital, forehead areas, nasal bridge with steri-strip, mucous membranes moist.   Neck: Supple  Cardiovascular: S1 S2 auscultated, irregularly irregular, no murmur  Respiratory: Clear to auscultation bilaterally, using abdominal muscles   Abdomen: Soft, nontender, nondistended, + bowel sounds  Extremities: warm dry without cyanosis clubbing or edema  Neuro: AAOx3, nonfocal  Psych: pleasant, appropriate mood and affect  Data Reviewed: I have personally reviewed following labs and imaging studies  CBC: Recent Labs  Lab 10/11/18 1551 10/12/18 0427 10/16/18 0553  WBC 5.3 4.8  --   NEUTROABS 3.9 3.5  --   HGB 16.8 12.8* 12.8*  HCT 50.0 36.2* 37.0*  MCV 102.9* 99.7  --   PLT PLATELET CLUMPS NOTED ON SMEAR, UNABLE TO ESTIMATE 138*  --    Basic Metabolic Panel: Recent Labs  Lab 10/11/18 1552 10/11/18 2223 10/12/18 0427 10/12/18 1326 10/13/18 0640 10/14/18 1232 10/15/18 0335 10/16/18 0553  NA 134*  --  135  --  135 134*  --  133*  K 4.5  --  3.9  --   3.8 4.2  --  4.7  CL 94*  --  99  --  102 100  --  96*  CO2 20*  --  25  --  23 28  --  27  GLUCOSE 72  --  150*  --  134* 141*  --  105*  BUN 12  --  12  --  12 11  --  11  CREATININE 0.94  --  1.06  --  0.87 0.83  --  0.78  CALCIUM 9.0  --  7.7*  --  7.5* 8.2*  --  8.3*  MG  --  1.2*  --  1.1* 1.4*  --  1.5* 1.5*   GFR: Estimated Creatinine Clearance: 111.2 mL/min (by C-G formula based on SCr of 0.78 mg/dL). Liver Function Tests: Recent Labs  Lab 10/11/18 1552 10/12/18 0427  AST 93* 55*  ALT 60* 43  ALKPHOS 62 49  BILITOT 3.1* 2.0*  PROT 6.8 5.3*  ALBUMIN 3.7 2.8*   No results for input(s): LIPASE, AMYLASE in the last 168 hours. No results for input(s): AMMONIA in  the last 168 hours. Coagulation Profile: No results for input(s): INR, PROTIME in the last 168 hours. Cardiac Enzymes: Recent Labs  Lab 10/11/18 1552 10/11/18 2223 10/12/18 0427 10/12/18 1027  CKTOTAL 287  --   --   --   TROPONINI  --  <0.03 <0.03 <0.03   BNP (last 3 results) No results for input(s): PROBNP in the last 8760 hours. HbA1C: No results for input(s): HGBA1C in the last 72 hours. CBG: Recent Labs  Lab 10/15/18 1130 10/15/18 1609 10/15/18 2146 10/16/18 0740 10/16/18 1136  GLUCAP 141* 98 122* 105* 112*   Lipid Profile: No results for input(s): CHOL, HDL, LDLCALC, TRIG, CHOLHDL, LDLDIRECT in the last 72 hours. Thyroid Function Tests: No results for input(s): TSH, T4TOTAL, FREET4, T3FREE, THYROIDAB in the last 72 hours. Anemia Panel: No results for input(s): VITAMINB12, FOLATE, FERRITIN, TIBC, IRON, RETICCTPCT in the last 72 hours. Urine analysis:    Component Value Date/Time   COLORURINE AMBER (A) 10/12/2018 0549   APPEARANCEUR CLEAR 10/12/2018 0549   LABSPEC 1.032 (H) 10/12/2018 0549   PHURINE 5.0 10/12/2018 0549   GLUCOSEU 50 (A) 10/12/2018 0549   HGBUR SMALL (A) 10/12/2018 0549   BILIRUBINUR NEGATIVE 10/12/2018 0549   KETONESUR 20 (A) 10/12/2018 0549   PROTEINUR NEGATIVE  10/12/2018 0549   UROBILINOGEN 0.2 03/18/2013 0050   NITRITE NEGATIVE 10/12/2018 0549   LEUKOCYTESUR NEGATIVE 10/12/2018 0549   Sepsis Labs: @LABRCNTIP (procalcitonin:4,lacticidven:4)  ) Recent Results (from the past 240 hour(s))  Blood culture (routine x 2)     Status: None (Preliminary result)   Collection Time: 10/11/18 11:55 PM  Result Value Ref Range Status   Specimen Description BLOOD RIGHT FOREARM  Final   Special Requests   Final    BOTTLES DRAWN AEROBIC ONLY Blood Culture adequate volume   Culture   Final    NO GROWTH 4 DAYS Performed at [facility], [address], [place], [place] [address]    Report Status PENDING  Incomplete  Blood culture (routine x 2)     Status: None (Preliminary result)   Collection Time: 10/12/18 12:05 AM  Result Value Ref Range Status   Specimen Description BLOOD RIGHT HAND  Final   Special Requests   Final    BOTTLES DRAWN AEROBIC ONLY Blood Culture adequate volume   Culture   Final    NO GROWTH 4 DAYS Performed at [facility], [address], [place], [place] [address]    Report Status PENDING  Incomplete      Radiology Studies: Dg Chest 2 View  Result Date: 10/16/2018 CLINICAL DATA:  Shortness of Breath EXAM: CHEST - 2 VIEW COMPARISON:  10/14/2018 FINDINGS: Stable cavitary lesion is noted in the right lower lobe with air-fluid level within. No focal infiltrate is seen. Cardiac shadow is stable. No bony abnormality is noted. IMPRESSION: Stable appearance of right lower lobe cavitary lesion. No acute abnormality noted. Electronically Signed   By: Alcide Clever M.D.   On: 10/16/2018 09:46     Scheduled Meds: . folic acid  1 mg Oral Daily  . insulin aspart  0-9 Units Subcutaneous TID WC  . LORazepam  1 mg Oral BID  . metoprolol tartrate  50 mg Oral BID  . thiamine  100 mg Oral Daily   Continuous Infusions: . ampicillin-sulbactam (UNASYN) IV 1.5 g (10/16/18 1343)     LOS: 4 days   Time Spent in minutes   30  minutes  Olaf Mesa D.O. on 10/16/2018 at  2:07 PM  Between 7am to 7pm - Please see pager noted on [email or web address]  After 7pm go to [email or web address]  And look for the night coverage person covering for me after hours  [facility] Office  [phone]

## 2018-10-16 NOTE — Social Work (Signed)
Discussed SNF bed offers with daughter, [name], on the phone. [name] has chosen [facility] CSW confirmed the bed at [facility] and started patient's [facility] authorization request for patient. Awaiting insurance approval. CSW to follow and support.  Abigail Butts, LCSW [phone]

## 2018-10-16 NOTE — Care Management Note (Signed)
Case Management Note  Patient Details  Name: Albert Hess MRN: [id number] Date of Birth: October 28, 1945  Subjective/Objective:  Pt presented after a fall at home with multiple lacerations and facial fractures found to be in Atrial Flutter with RVR upon arrival. Patient is receiving IV antibiotics for possible asipration Pneumonia.                   Action/Plan: CSW is working with patient and daughter for SNF placement. CM will continue to monitor for additional transition of care needs.   Expected Discharge Date:                  Expected Discharge Plan:  Skilled Nursing Facility  In-House Referral:  Clinical Social Work  Discharge planning Services  CM Consult  Post Acute Care Choice:  NA Choice offered to:  NA  DME Arranged:  N/A DME Agency:  NA  HH Arranged:  NA HH Agency:  NA  Status of Service:  In process, will continue to follow  If discussed at Long Length of Stay Meetings, dates discussed:    Additional Comments:  Gala Lewandowsky, RN 10/16/2018, 1:15 PM

## 2018-10-17 LAB — BASIC METABOLIC PANEL
Anion gap: 7 (ref 5–15)
BUN: 10 mg/dL (ref 8–23)
CHLORIDE: 97 mmol/L — AB (ref 98–111)
CO2: 31 mmol/L (ref 22–32)
Calcium: 8.6 mg/dL — ABNORMAL LOW (ref 8.9–10.3)
Creatinine, Ser: 0.86 mg/dL (ref 0.61–1.24)
GFR calc non Af Amer: >60 mL/min (ref >60)
Glucose, Bld: 113 mg/dL — ABNORMAL HIGH (ref 70–99)
Potassium: 4.9 mmol/L (ref 3.5–5.1)
Sodium: 135 mmol/L (ref 135–145)

## 2018-10-17 LAB — CULTURE, BLOOD (ROUTINE X 2)
Culture: NO GROWTH
Culture: NO GROWTH
Special Requests: ADEQUATE
Special Requests: ADEQUATE

## 2018-10-17 LAB — GLUCOSE, CAPILLARY
Glucose-Capillary: 92 mg/dL (ref 70–99)
Glucose-Capillary: 145 mg/dL — ABNORMAL HIGH (ref 70–99)
Glucose-Capillary: 113 mg/dL — ABNORMAL HIGH (ref 70–99)
Glucose-Capillary: 119 mg/dL — ABNORMAL HIGH (ref 70–99)

## 2018-10-17 LAB — HEMOGLOBIN AND HEMATOCRIT, BLOOD
HEMATOCRIT: 36.1 % — AB (ref 39.0–52.0)
HEMOGLOBIN: 12.4 g/dL — AB (ref 13.0–17.0)

## 2018-10-17 LAB — MAGNESIUM: Magnesium: 1.8 mg/dL (ref 1.7–2.4)

## 2018-10-17 MED ORDER — METOPROLOL TARTRATE 25 MG PO TABS
25.0000 mg | ORAL_TABLET | Freq: Once | ORAL | Status: AC
  Administered 2018-10-17: 25 mg via ORAL
  Filled 2018-10-17: qty 1

## 2018-10-17 MED ORDER — METOPROLOL TARTRATE 50 MG PO TABS
75.0000 mg | ORAL_TABLET | Freq: Two times a day (BID) | ORAL | Status: DC
  Administered 2018-10-17 – 2018-10-18 (×2): 75 mg via ORAL
  Filled 2018-10-17 (×2): qty 1

## 2018-10-17 NOTE — Progress Notes (Signed)
Progress Note  Patient Name: Albert Hess Date of Encounter: 10/17/2018  Primary Cardiologist: Rollene Rotunda, MD   Subjective   Denies any chest pain or SOB but remains in atrial fibrillation with RVR to 110 bpm  Inpatient Medications    Scheduled Meds: . folic acid  1 mg Oral Daily  . insulin aspart  0-9 Units Subcutaneous TID WC  . LORazepam  1 mg Oral BID  . metoprolol tartrate  50 mg Oral BID  . thiamine  100 mg Oral Daily   Continuous Infusions: . ampicillin-sulbactam (UNASYN) IV 1.5 g (10/17/18 0644)   PRN Meds: acetaminophen **OR** acetaminophen, bacitracin, chlorhexidine, levalbuterol, LORazepam, ondansetron **OR** ondansetron (ZOFRAN) IV, sodium chloride flush   Vital Signs    Vitals:   10/17/18 0044 10/17/18 0400 10/17/18 0624 10/17/18 0823  BP: 117/74  (!) 147/83 (!) 152/95  Pulse: 78 98 97 (!) 117  Resp: (!) 30  (!) 22   Temp:   98.6 F (37 C)   TempSrc:   Oral   SpO2: 100%  92%   Weight:   115.8 kg   Height:        Intake/Output Summary (Last 24 hours) at 10/17/2018 0848 Last data filed at 10/17/2018 1287 Gross per 24 hour  Intake 802 ml  Output 1350 ml  Net -548 ml   Filed Weights   10/15/18 0416 10/16/18 0549 10/17/18 0624  Weight: 113.4 kg 115.5 kg 115.8 kg    Telemetry    Atrial fibrillation with RVR to 110 bpm- Personally Reviewed  ECG    No new EKG to review- Personally Reviewed  Physical Exam   GEN: No acute distress.   Neck: No JVD Cardiac:  Irregularly irregular and tachycardic, no murmurs, rubs, or gallops.  Respiratory: Clear to auscultation bilaterally. GI: Soft, nontender, non-distended  MS: No edema; No deformity. Neuro:  Nonfocal  Psych: Normal affect   Labs    Chemistry Recent Labs  Lab 10/11/18 1552 10/12/18 0427  10/14/18 1232 10/16/18 0553 10/17/18 0649  NA 134* 135   < > 134* 133* 135  K 4.5 3.9   < > 4.2 4.7 4.9  CL 94* 99   < > 100 96* 97*  CO2 20* 25   < > 28 27 31   GLUCOSE 72 150*   < > 141*  105* 113*  BUN 12 12   < > 11 11 10   CREATININE 0.94 1.06   < > 0.83 0.78 0.86  CALCIUM 9.0 7.7*   < > 8.2* 8.3* 8.6*  PROT 6.8 5.3*  --   --   --   --   ALBUMIN 3.7 2.8*  --   --   --   --   AST 93* 55*  --   --   --   --   ALT 60* 43  --   --   --   --   ALKPHOS 62 49  --   --   --   --   BILITOT 3.1* 2.0*  --   --   --   --   GFRNONAA >60 >60   < > >60 >60 >60  GFRAA >60 >60   < > >60 >60 >60  ANIONGAP 20* 11   < > 6 10 7    < > = values in this interval not displayed.     Hematology Recent Labs  Lab 10/11/18 1551 10/12/18 0427 10/16/18 0553 10/17/18 0649  WBC 5.3 4.8  --   --  RBC 4.86 3.63*  --   --   HGB 16.8 12.8* 12.8* 12.4*  HCT 50.0 36.2* 37.0* 36.1*  MCV 102.9* 99.7  --   --   MCH 34.6* 35.3*  --   --   MCHC 33.6 35.4  --   --   RDW 12.6 12.5  --   --   PLT PLATELET CLUMPS NOTED ON SMEAR, UNABLE TO ESTIMATE 138*  --   --     Cardiac Enzymes Recent Labs  Lab 10/11/18 2223 10/12/18 0427 10/12/18 1027  TROPONINI <0.03 <0.03 <0.03    Recent Labs  Lab 10/11/18 1642  TROPIPOC 0.02     BNPNo results for input(s): BNP, PROBNP in the last 168 hours.   DDimer No results for input(s): DDIMER in the last 168 hours.   Radiology    Dg Chest 2 View  Result Date: 10/16/2018 CLINICAL DATA:  Shortness of Breath EXAM: CHEST - 2 VIEW COMPARISON:  10/14/2018 FINDINGS: Stable cavitary lesion is noted in the right lower lobe with air-fluid level within. No focal infiltrate is seen. Cardiac shadow is stable. No bony abnormality is noted. IMPRESSION: Stable appearance of right lower lobe cavitary lesion. No acute abnormality noted. Electronically Signed   By: Alcide CleverMark  Lukens M.D.   On: 10/16/2018 09:46    Cardiac Studies   2D echo 10/15/2018 Study Conclusions  - Left ventricle: The cavity size was normal. Wall thickness was normal. Systolic function was normal. The estimated ejection fraction was in the range of 55% to 60%. Wall motion was normal; there were no  regional wall motion abnormalities. The study is not technically sufficient to allow evaluation of LV diastolic function.  Patient Profile     73 y.o. male w/ h/o heavy ETOH use, T2DM, HTN, HLD,bipolar disorder, Schizophrenia and PTSD brought to the ED after being found by family on the floor after a fall, sustained multiple lacerations, periorbital hematoma and nasal bone fracture, also found to be in atrial flutter w/ RVR and being treated for possible aspirationPNA  Assessment & Plan    1. Atrial flutter with RVR - in the setting of heavyalcohol use, found down the floor after falling with multiple lacerations and periorbital hematoma as well as facial fractures -Magnesium was low on admission at 1.2has been supplemented - now 1.8 -Mains in atrial fibrillation although heart rate improved after increasing Lopressor but still in the 110 bpm range -Not an anticoagulation candidate at this time due to alcohol abuse, psychiatric issues increased risk of falls. He would have to prove that he could abstain from alcohol before considering long-term anticoagulation -Increase Lopressor to 75 mg twice daily -2D echo with normal LV function  2. Possible aspiration pneumonia -Currently on antibiotics per [facility]  3. Fall with injury -This occurred in the setting of intoxication sustaining multiple lacerations, peri-orbital hematoma and nasal bone fracture  4. Alcohol abuse -Apparently drinks vodka daily and goes through 1/2 gallon every 3 days -EtOH level was elevated to 29 in ER -Currently on CIWA protocol per [facility]  5. Hypomagnesemia -Now repleted at 1.8  6. Hypertension -BP P elevated today at 152/95 mmHg -Increasing Lopressor to 75 mg twice daily for better blood pressure and heart rate control  7. Psychiatric disorders -He has a history of bipolar disorder, schizophrenia and PTSD along with heavy alcohol use -Consider psych consult     For questions or updates,  please contact [facility] Please consult [email or web address] for contact info under Cardiology/STEMI.  Signed, Armanda Magicraci Khadija Thier, MD  10/17/2018, 8:48 AM

## 2018-10-17 NOTE — Progress Notes (Signed)
Occupational Therapy Treatment Patient Details Name: Albert Hess MRN: [id number] DOB: 15-Nov-1945 Today's Date: 10/17/2018    History of present illness 73 y.o. male w/ h/o heavy ETOH use, T2DM, HTN, HLD, bipolar disorder, Schizophrenia and PTSD brought to the ED after being found by family on the floor after a fall, sustained multiple lacerations, periorbital hematoma and nasal bone fracture, also found to be in atrial flutter w/ RVR and being treated for possible aspiration PNA.    OT comments  Pt performing bed mobility with minA and elevated surface for sit to stand, modA. Pt stand pivot transfer to recliner. SOB noted. OTR increased o2 to 4L through nasal cannula due to pt about to eat lunch in recliner. Pt with O2 sats mid 80s with pt's inability to comfortably breathe through nose due to nasal fractures. Pt finally resumed >90% on 4L after 1 minute of pursed lip breathing. Pt tolerating sitting up in chair x1 hour before calling out for assist back to bed. Pt continues to be minA for UB ADl and mod to maxA for LB ADL.  Pt would benefit from continued OT skilled services in SNF setting. Pt progressing.   Follow Up Recommendations  SNF;Supervision/Assistance - 24 hour    Equipment Recommendations  Other (comment)(RW)    Recommendations for Other Services      Precautions / Restrictions Precautions Precautions: Fall;Other (comment) Precaution Comments: watch sats and RR Restrictions Weight Bearing Restrictions: No       Mobility Bed Mobility Overal bed mobility: Needs Assistance Bed Mobility: Supine to Sit;Sit to Supine     Supine to sit: Min assist;HOB elevated Sit to supine: Min assist;HOB elevated   General bed mobility comments: +rail, increased time and effort  Transfers Overall transfer level: Needs assistance Equipment used: Rolling walker (2 wheeled) Transfers: Sit to/from Stand Sit to Stand: Mod assist         General transfer comment: cues for hand  placement, assist to power up from elevated surface. Initial stance without AD and pt very unsteady.     Balance Overall balance assessment: Needs assistance Sitting-balance support: No upper extremity supported;Feet supported Sitting balance-Leahy Scale: Good       Standing balance-Leahy Scale: Poor Standing balance comment: reliant on UE support and external support                           ADL either performed or assessed with clinical judgement   ADL Overall ADL's : Needs assistance/impaired Eating/Feeding: Set up;Sitting(up in recliner, breathing heavy)                                   Functional mobility during ADLs: Cueing for safety;Cueing for sequencing;Moderate assistance;Rolling walker General ADL Comments: Pt performing MinA UB ADL and mod to MaxA for LB ADL     Vision       Perception     Praxis      Cognition Arousal/Alertness: Awake/alert Behavior During Therapy: Flat affect Overall Cognitive Status: Within Functional Limits for tasks assessed Area of Impairment: Safety/judgement;Problem solving                   Current Attention Level: Selective     Safety/Judgement: Decreased awareness of safety;Decreased awareness of deficits   Problem Solving: Requires verbal cues General Comments: Pt with SOB upon arrival and worsens with activity 4L O2 [place] required >90%.  Exercises     Shoulder Instructions       General Comments      Pertinent Vitals/ Pain       Pain Assessment: No/denies pain  Home Living                                          Prior Functioning/Environment              Frequency  Min 2X/week        Progress Toward Goals  OT Goals(current goals can now be found in the care plan section)  Progress towards OT goals: Progressing toward goals  Acute Rehab OT Goals Patient Stated Goal: to breathe better OT Goal Formulation: With patient Time For Goal  Achievement: 10/29/18 Potential to Achieve Goals: Good ADL Goals Pt Will Perform Grooming: with set-up;with supervision;sitting Pt Will Perform Upper Body Bathing: with supervision;with set-up;sitting Pt Will Perform Lower Body Bathing: with mod assist Pt Will Perform Upper Body Dressing: with supervision;with set-up Pt Will Transfer to Toilet: with min assist;ambulating;regular height toilet;grab bars Pt Will Perform Toileting - Clothing Manipulation and hygiene: with min assist;sit to/from stand  Plan Discharge plan remains appropriate    Co-evaluation                 AM-PAC OT "6 Clicks" Daily Activity     Outcome Measure   Help from another person eating meals?: None Help from another person taking care of personal grooming?: A Little Help from another person toileting, which includes using toliet, bedpan, or urinal?: A Lot Help from another person bathing (including washing, rinsing, drying)?: A Lot Help from another person to put on and taking off regular upper body clothing?: A Little Help from another person to put on and taking off regular lower body clothing?: A Lot 6 Click Score: 16    End of Session Equipment Utilized During Treatment: Gait belt;Rolling walker  OT Visit Diagnosis: Unsteadiness on feet (R26.81);Other abnormalities of gait and mobility (R26.89);Muscle weakness (generalized) (M62.81);History of falling (Z91.81);Other symptoms and signs involving cognitive function   Activity Tolerance Patient limited by fatigue   Patient Left in chair;with call bell/phone within reach;with chair alarm set   Nurse Communication Mobility status        Time: 3845-3646 OT Time Calculation (min): 29 min  Charges: OT General Charges $OT Visit: 1 Visit OT Treatments $Self Care/Home Management : 8-22 mins $Neuromuscular Re-education: 8-22 mins  Cristi Loron) Glendell Docker OTR/L Acute Rehabilitation Services Pager: [phone] Office: [phone]    Sandrea Hughs 10/17/2018, 1:42 PM

## 2018-10-17 NOTE — Progress Notes (Signed)
Orthopedic Tech Progress Note Patient Details:  Albert Hess 02-19-1946 [id number] Patient Refused Treatment Ortho Devices Type of Ortho Device: Unna boot Ortho Device/Splint Interventions: Ordered   Post Interventions Patient Tolerated: Poor Instructions Provided: Adjustment of device, Care of device   [name] [name] 10/17/2018, 1:18 PM

## 2018-10-17 NOTE — Social Work (Addendum)
10:58 am Patient's PASRR under review due to mental health diagnoses. Will need PASRR before admitting the facility.  10:21 am Received [facility] authorization for SNF. Auth #[id number]. Start date today, 10/17/18. Provided auth info and PDPM codes to [facility].   SNF will not be able to accept patient on unasyn due to cost. Did discuss with MD. CSW to follow and support with discharge planning.  Abigail Butts, LCSW [phone]

## 2018-10-17 NOTE — Progress Notes (Signed)
Orthopedic Tech Progress Note Patient Details:  Albert Hess 02-10-1946 [id number]  Ortho Devices Type of Ortho Device: Radio broadcast assistant Ortho Device/Splint Interventions: Ordered   Post Interventions Patient Tolerated: Refused intervention Instructions Provided: Adjustment of device, Care of device   Saul Fordyce 10/17/2018, 3:16 PM

## 2018-10-17 NOTE — Progress Notes (Signed)
PROGRESS NOTE    Albert GoldsmithMark M Hess  ZOX:[id number] DOB: 11-Jun-1946 DOA: 10/11/2018 PCP: Albert LundBecker, Anna G, PA   Brief Narrative:  HPI on 10/11/2018 by Dr. Midge MiniumArshad Hess Albert Hess is a 73 y.o. male with history of alcohol abuse drinks vodka every day, diabetes mellitus type 2 on metformin, history of hypertension presently not on medication and history of bipolar disorder per the chart was brought to the ER the patient was found on the floor by patient's family member today.  Patient states he may have fallen last night and has remained on the floor since.  Patient states he does not recall exactly when he fell.  He drinks alcohol every day.  Denies any chest pain shortness of breath palpitation nausea vomiting abdominal pain or diarrhea.  Interim history Admitted for sycnope, atrial flutter with RVR.  He was also noted to have a periorbital hematoma and nasal bone fracture upon admission.  Assessment & Plan   New Onset Atrial Flutter with RVR -likely precipitated by heavy alcohol use vs pneumonia vs electrolyte disturbances -Cardiology consulted and appreciated -Initially required Cardizem drip, currently transitioned to oral regimen, and his metoprolol is been gradually uptitrated by cardiology, he remains in A. fib with RVR, heart rate averaging 110s, metoprolol has increased to 75 mg oral twice daily, blood pressure stable and allows. -Continue to replace magnesium around 2 and keep potassium around 4 -TSH 3.960, WNL -Echocardiogram EF 55-60%, no regional wall motion abnormalities  -Poor anticoagulation candidate given alcohol use.  Patient will need to abstain from alcohol prior to consideration of long-term anticoagulation  Patchy infiltrates with chronic right cavitary lung lesion/Possible Asp pneumonia -Pulmonology consulted and appreciated -Was initially placed on azithromycin and ceftriaxone, transitioned to Unasyn for aspiration coverage, appears to be improving, he can be switched to  oral Augmentin on discharge. 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 4-6 weeks of antibiotics  -Repeat CXR reviewed, still shows RLL cavitary lesion  Syncope/Fall -Suspect secondary to atrial flutter in conjunction with alcohol -Trauma work-up was unremarkable except for nasal fracture -Continue supportive care -CT chest negative for PE -Echocardiogram as above -PT/OT consulted, recommended SNF  Prolonged QT -Patient was given azithromycin which is now been discontinued -Continue to monitor closely, potassium is 4.9 today, magnesium is 1.8  Hypomagnesemia -Mag 1.5, will replace again today -continue to monitor   Alcohol dependence -Does not appear to have alcohol withdrawal -Drinks approximately half a gallon of vodka per day -Continue CIWA protocol  Diabetes mellitus, type 2 -metformin held -Continue ISS and CBG monitoring  PTSD/bipolar disorder/schizophrenia -Previously was on Risperdal, quetiapine, but has been off of these medications for more than 1 year -Need to have outpatient follow-up  DVT Prophylaxis  SCDs  Code Status: Full  Family Communication: None at bedside  Disposition Plan: Admitted. Pending cardio recommendations and SNF placement  Consultants Pulmonology Cardiology  Procedures  Echocardiogram  Antibiotics   Anti-infectives (From admission, onward)   Start     Dose/Rate Route Frequency Ordered Stop   10/12/18 2100  azithromycin (ZITHROMAX) 500 mg in sodium chloride 0.9 % 250 mL IVPB  Status:  Discontinued     500 mg 250 mL/hr over 60 Minutes Intravenous Every 24 hours 10/11/18 2246 10/12/18 1810   10/12/18 2000  cefTRIAXone (ROCEPHIN) 1 Hess in sodium chloride 0.9 % 100 mL IVPB  Status:  Discontinued     1 Hess  200 mL/hr over 30 Minutes Intravenous Every 24 hours 10/11/18 2246 10/12/18 1810   10/12/18 1900   ampicillin-sulbactam (UNASYN) 1.5 Hess in sodium chloride 0.9 % 100 mL IVPB     1.5 Hess 200 mL/hr over 30 Minutes Intravenous Every 6 hours 10/12/18 1812     10/11/18 1930  levofloxacin (LEVAQUIN) IVPB 750 mg     750 mg 100 mL/hr over 90 Minutes Intravenous  Once 10/11/18 1923 10/11/18 2159      Subjective:   Albert Hess seen and examined today.  Denies any complaints today, no nausea, no vomiting,   Objective:   Vitals:   10/17/18 0400 10/17/18 0624 10/17/18 0823 10/17/18 0947  BP:  (!) 147/83 (!) 152/95 135/77  Pulse: 98 97 (!) 117 (!) 109  Resp:  (!) 22    Temp:  98.6 F (37 C)    TempSrc:  Oral    SpO2:  92%    Weight:  115.8 kg    Height:        Intake/Output Summary (Last 24 hours) at 10/17/2018 1103 Last data filed at 10/17/2018 1013 Gross per 24 hour  Intake 802 ml  Output 1350 ml  Net -548 ml   Filed Weights   10/15/18 0416 10/16/18 0549 10/17/18 0624  Weight: 113.4 kg 115.5 kg 115.8 kg   Exam  Awake Alert, Oriented X 3, No new F.N deficits, Normal affect Some bruising and ecchymosis in the right periorbital area, forehead and nasal bridge Symmetrical Chest wall movement, Good air movement bilaterally, CTAB(using abdominal muscles, report this is chronically over the last 3 years) RRR,No Gallops,Rubs or new Murmurs, No Parasternal Heave +ve B.Sounds, Abd Soft, No tenderness, No rebound - guarding or rigidity. No Cyanosis, Clubbing or edema, No new Rash or bruise    Data Reviewed: I have personally reviewed following labs and imaging studies  CBC: Recent Labs  Lab 10/11/18 1551 10/12/18 0427 10/16/18 0553 10/17/18 0649  WBC 5.3 4.8  --   --   NEUTROABS 3.9 3.5  --   --   HGB 16.8 12.8* 12.8* 12.4*  HCT 50.0 36.2* 37.0* 36.1*  MCV 102.9* 99.7  --   --   PLT PLATELET CLUMPS NOTED ON SMEAR, UNABLE TO ESTIMATE 138*  --   --    Basic Metabolic Panel: Recent Labs  Lab 10/12/18 0427 10/12/18 1326 10/13/18 0640 10/14/18 1232 10/15/18 0335 10/16/18 0553  10/17/18 0649  NA 135  --  135 134*  --  133* 135  K 3.9  --  3.8 4.2  --  4.7 4.9  CL 99  --  102 100  --  96* 97*  CO2 25  --  23 28  --  27 31  GLUCOSE 150*  --  134* 141*  --  105* 113*  BUN 12  --  12 11  --  11 10  CREATININE 1.06  --  0.87 0.83  --  0.78 0.86  CALCIUM 7.7*  --  7.5* 8.2*  --  8.3* 8.6*  MG  --  1.1* 1.4*  --  1.5* 1.5* 1.8   GFR: Estimated Creatinine Clearance: 103.5 mL/min (by C-Hess formula based on SCr of 0.86 mg/dL). Liver Function Tests: Recent Labs  Lab 10/11/18 1552 10/12/18 0427  AST 93* 55*  ALT 60* 43  ALKPHOS 62 49  BILITOT 3.1* 2.0*  PROT 6.8 5.3*  ALBUMIN 3.7 2.8*   No results for input(s): LIPASE, AMYLASE in the last 168 hours. No results  for input(s): AMMONIA in the last 168 hours. Coagulation Profile: No results for input(s): INR, PROTIME in the last 168 hours. Cardiac Enzymes: Recent Labs  Lab 10/11/18 1552 10/11/18 2223 10/12/18 0427 10/12/18 1027  CKTOTAL 287  --   --   --   TROPONINI  --  <0.03 <0.03 <0.03   BNP (last 3 results) No results for input(s): PROBNP in the last 8760 hours. HbA1C: No results for input(s): HGBA1C in the last 72 hours. CBG: Recent Labs  Lab 10/16/18 0740 10/16/18 1136 10/16/18 1621 10/16/18 2127 10/17/18 0740  GLUCAP 105* 112* 117* 132* 92   Lipid Profile: No results for input(s): CHOL, HDL, LDLCALC, TRIG, CHOLHDL, LDLDIRECT in the last 72 hours. Thyroid Function Tests: No results for input(s): TSH, T4TOTAL, FREET4, T3FREE, THYROIDAB in the last 72 hours. Anemia Panel: No results for input(s): VITAMINB12, FOLATE, FERRITIN, TIBC, IRON, RETICCTPCT in the last 72 hours. Urine analysis:    Component Value Date/Time   COLORURINE AMBER (A) 10/12/2018 0549   APPEARANCEUR CLEAR 10/12/2018 0549   LABSPEC 1.032 (H) 10/12/2018 0549   PHURINE 5.0 10/12/2018 0549   GLUCOSEU 50 (A) 10/12/2018 0549   HGBUR SMALL (A) 10/12/2018 0549   BILIRUBINUR NEGATIVE 10/12/2018 0549   KETONESUR 20 (A)  10/12/2018 0549   PROTEINUR NEGATIVE 10/12/2018 0549   UROBILINOGEN 0.2 03/18/2013 0050   NITRITE NEGATIVE 10/12/2018 0549   LEUKOCYTESUR NEGATIVE 10/12/2018 0549   Sepsis Labs: @LABRCNTIP (procalcitonin:4,lacticidven:4)  ) Recent Results (from the past 240 hour(s))  Blood culture (routine x 2)     Status: None   Collection Time: 10/11/18 11:55 PM  Result Value Ref Range Status   Specimen Description BLOOD RIGHT FOREARM  Final   Special Requests   Final    BOTTLES DRAWN AEROBIC ONLY Blood Culture adequate volume   Culture   Final    NO GROWTH 5 DAYS Performed at [facility], [address] [place], [place] [address]    Report Status 10/17/2018 FINAL  Final  Blood culture (routine x 2)     Status: None   Collection Time: 10/12/18 12:05 AM  Result Value Ref Range Status   Specimen Description BLOOD RIGHT HAND  Final   Special Requests   Final    BOTTLES DRAWN AEROBIC ONLY Blood Culture adequate volume   Culture   Final    NO GROWTH 5 DAYS Performed at [facility], [address] [place], [place] [address]    Report Status 10/17/2018 FINAL  Final      Radiology Studies: Dg Chest 2 View  Result Date: 10/16/2018 CLINICAL DATA:  Shortness of Breath EXAM: CHEST - 2 VIEW COMPARISON:  10/14/2018 FINDINGS: Stable cavitary lesion is noted in the right lower lobe with air-fluid level within. No focal infiltrate is seen. Cardiac shadow is stable. No bony abnormality is noted. IMPRESSION: Stable appearance of right lower lobe cavitary lesion. No acute abnormality noted. Electronically Signed   By: Alcide Clever M.D.   On: 10/16/2018 09:46     Scheduled Meds: . folic acid  1 mg Oral Daily  . insulin aspart  0-9 Units Subcutaneous TID WC  . LORazepam  1 mg Oral BID  . metoprolol tartrate  75 mg Oral BID  . thiamine  100 mg Oral Daily   Continuous Infusions: . ampicillin-sulbactam (UNASYN) IV 1.5 Hess (10/17/18 0644)     LOS: 5 days   Huey Bienenstock MD. on  10/17/2018 at 11:03 AM  Between 7am to 7pm -  Please see pager noted on [email or web address]  After 7pm go to [email or web address]  And look for the night coverage person covering for me after hours  [facility] Office  [phone]

## 2018-10-18 ENCOUNTER — Telehealth: Payer: Self-pay | Admitting: Cardiology

## 2018-10-18 LAB — BASIC METABOLIC PANEL
Anion gap: 8 (ref 5–15)
BUN: 9 mg/dL (ref 8–23)
CHLORIDE: 95 mmol/L — AB (ref 98–111)
CO2: 32 mmol/L (ref 22–32)
Calcium: 8.6 mg/dL — ABNORMAL LOW (ref 8.9–10.3)
Creatinine, Ser: 0.85 mg/dL (ref 0.61–1.24)
GFR calc Af Amer: >60 mL/min (ref >60)
GFR calc non Af Amer: >60 mL/min (ref >60)
Glucose, Bld: 108 mg/dL — ABNORMAL HIGH (ref 70–99)
Potassium: 4.9 mmol/L (ref 3.5–5.1)
Sodium: 135 mmol/L (ref 135–145)

## 2018-10-18 LAB — GLUCOSE, CAPILLARY
GLUCOSE-CAPILLARY: 101 mg/dL — AB (ref 70–99)
GLUCOSE-CAPILLARY: 101 mg/dL — AB (ref 70–99)
Glucose-Capillary: 164 mg/dL — ABNORMAL HIGH (ref 70–99)
Glucose-Capillary: 94 mg/dL (ref 70–99)

## 2018-10-18 MED ORDER — METOPROLOL TARTRATE 100 MG PO TABS
100.0000 mg | ORAL_TABLET | Freq: Two times a day (BID) | ORAL | Status: DC
  Administered 2018-10-18 – 2018-10-19 (×2): 100 mg via ORAL
  Filled 2018-10-18 (×2): qty 1

## 2018-10-18 MED ORDER — METOPROLOL TARTRATE 25 MG PO TABS
25.0000 mg | ORAL_TABLET | Freq: Once | ORAL | Status: AC
  Administered 2018-10-18: 25 mg via ORAL
  Filled 2018-10-18: qty 1

## 2018-10-18 NOTE — Progress Notes (Addendum)
Progress Note  Patient Name: Albert Hess Date of Encounter: 10/18/2018  Primary Cardiologist: Rollene Rotunda, MD   Subjective   Pt has no complaints this morning.  Inpatient Medications    Scheduled Meds: . folic acid  1 mg Oral Daily  . insulin aspart  0-9 Units Subcutaneous TID WC  . LORazepam  1 mg Oral BID  . metoprolol tartrate  75 mg Oral BID  . thiamine  100 mg Oral Daily   Continuous Infusions: . ampicillin-sulbactam (UNASYN) IV 1.5 g (10/18/18 0649)   PRN Meds: acetaminophen **OR** acetaminophen, bacitracin, chlorhexidine, levalbuterol, LORazepam, ondansetron **OR** ondansetron (ZOFRAN) IV, sodium chloride flush   Vital Signs    Vitals:   10/17/18 0823 10/17/18 0947 10/17/18 2144 10/17/18 2341  BP: (!) 152/95 135/77 (!) 162/126 (!) 141/71  Pulse: (!) 117 (!) 109 (!) 121   Resp:      Temp:      TempSrc:      SpO2:      Weight:      Height:        Intake/Output Summary (Last 24 hours) at 10/18/2018 0914 Last data filed at 10/17/2018 1819 Gross per 24 hour  Intake 130.52 ml  Output 725 ml  Net -594.48 ml   Last 3 Weights 10/17/2018 10/16/2018 10/15/2018  Weight (lbs) 255 lb 4.7 oz 254 lb 10.1 oz 250 lb  Weight (kg) 115.8 kg 115.5 kg 113.4 kg  Some encounter information is confidential and restricted. Go to Review Flowsheets activity to see all data.      Telemetry    Atrial flutter - Personally Reviewed  ECG    No new tracings - Personally Reviewed  Physical Exam   GEN: No acute distress.   Neck: No JVD Cardiac: irregular rhythm, regular rate Respiratory: respirations unlabored, but "abdominal breathing pattern" GI: Soft, nontender, non-distended  MS: No edema; No deformity. Neuro:  Nonfocal  Psych: Normal affect   Labs    Chemistry Recent Labs  Lab 10/11/18 1552 10/12/18 0427  10/16/18 0553 10/17/18 0649 10/18/18 0406  NA 134* 135   < > 133* 135 135  K 4.5 3.9   < > 4.7 4.9 4.9  CL 94* 99   < > 96* 97* 95*  CO2 20* 25   < > 27  31 32  GLUCOSE 72 150*   < > 105* 113* 108*  BUN 12 12   < > 11 10 9   CREATININE 0.94 1.06   < > 0.78 0.86 0.85  CALCIUM 9.0 7.7*   < > 8.3* 8.6* 8.6*  PROT 6.8 5.3*  --   --   --   --   ALBUMIN 3.7 2.8*  --   --   --   --   AST 93* 55*  --   --   --   --   ALT 60* 43  --   --   --   --   ALKPHOS 62 49  --   --   --   --   BILITOT 3.1* 2.0*  --   --   --   --   GFRNONAA >60 >60   < > >60 >60 >60  GFRAA >60 >60   < > >60 >60 >60  ANIONGAP 20* 11   < > 10 7 8    < > = values in this interval not displayed.     Hematology Recent Labs  Lab 10/11/18 1551 10/12/18 4469 10/16/18 5072 10/17/18 2575  WBC 5.3 4.8  --   --   RBC 4.86 3.63*  --   --   HGB 16.8 12.8* 12.8* 12.4*  HCT 50.0 36.2* 37.0* 36.1*  MCV 102.9* 99.7  --   --   MCH 34.6* 35.3*  --   --   MCHC 33.6 35.4  --   --   RDW 12.6 12.5  --   --   PLT PLATELET CLUMPS NOTED ON SMEAR, UNABLE TO ESTIMATE 138*  --   --     Cardiac Enzymes Recent Labs  Lab 10/11/18 2223 10/12/18 0427 10/12/18 1027  TROPONINI <0.03 <0.03 <0.03    Recent Labs  Lab 10/11/18 1642  TROPIPOC 0.02     BNPNo results for input(s): BNP, PROBNP in the last 168 hours.   DDimer No results for input(s): DDIMER in the last 168 hours.   Radiology    Dg Chest 2 View  Result Date: 10/16/2018 CLINICAL DATA:  Shortness of Breath EXAM: CHEST - 2 VIEW COMPARISON:  10/14/2018 FINDINGS: Stable cavitary lesion is noted in the right lower lobe with air-fluid level within. No focal infiltrate is seen. Cardiac shadow is stable. No bony abnormality is noted. IMPRESSION: Stable appearance of right lower lobe cavitary lesion. No acute abnormality noted. Electronically Signed   By: Alcide CleverMark  Lukens M.D.   On: 10/16/2018 09:46    Cardiac Studies   2D echo 10/15/2018 Study Conclusions  - Left ventricle: The cavity size was normal. Wall thickness was normal. Systolic function was normal. The estimated ejection fraction was in the range of 55% to 60%. Wall  motion was normal; there were no regional wall motion abnormalities. The study is not technically sufficient to allow evaluation of LV diastolic function.  Patient Profile     73 y.o. male w/ h/o heavy ETOH use, T2DM, HTN, HLD,bipolar disorder, Schizophrenia and PTSD brought to the ED after being found by family on the floor after a fall, sustained multiple lacerations, periorbital hematoma and nasal bone fracture, also found to be in atrial flutter w/ RVR and being treated for possible aspirationPNA  Assessment & Plan    1. Atrial flutter RVR - in the setting of heavyalcohol use, found down the floor after falling with multiple lacerations and periorbital hematoma as well as facial fractures - Mg replaced, 1.8 yesterday - not an anticoagulation candidate if he continues alcohol abuse - lopressor increased to 75 mg BID yesterday - rates today improved to the 90s-100s with some tachycardic bursts overnight - increase lopressor to 100 mg BID with eventual transition to toprol for better medication compliance   2. Possible aspiration PNA - ABX per primary   3. Alcohol abuse - Apparently drinks vodka daily and goes through 1/2 gallon every 3 days - EtOH level was elevated to 29 in ER - Currently on CIWA protocol per [facility]   4. HTN - follow BP trend today   5. Psychiatric disorders - He has a history of bipolar disorder, schizophrenia and PTSD along with heavy alcohol use       For questions or updates, please contact [facility] Please consult [email or web address] for contact info under        Signed, Marcelino Dusterngela Nicole Onedia Vargus, PA  10/18/2018, 9:14 AM

## 2018-10-18 NOTE — Progress Notes (Addendum)
PT Cancellation Note  Patient Details Name: Albert Hess MRN: [id number] DOB: [birth year]   Cancelled Treatment:    Reason Eval/Treat Not Completed: Other (comment)("I am too tired.  I can't do it this am.")Pt with visible SOB at rest.  Will check back as able.    [name] 10/18/2018, 10:29 AM [name] Pager:  [phone]  Office:  [phone]

## 2018-10-18 NOTE — Telephone Encounter (Signed)
Unable to leave vm as vm box not set up.

## 2018-10-18 NOTE — Progress Notes (Signed)
RT note: Instructed patient with use of a Flutter valve using the teach back method. Patient displayed good effort and understanding. Patient had a good productive cough post device use.

## 2018-10-18 NOTE — Progress Notes (Signed)
PROGRESS NOTE    Albert GoldsmithMark M Hess  ZOX:[id number] DOB: [birth year] DOA: 10/11/2018 PCP: Wilfrid LundBecker, Albert G, PA   Brief Narrative:   HPI on 10/11/2018 by Dr. Midge MiniumArshad Hess Albert GoldsmithMark M Sidell is a 73 y.o. male with history of alcohol abuse drinks vodka every day, diabetes mellitus type 2 on metformin, history of hypertension presently not on medication and history of bipolar disorder per the chart was brought to the ER the patient was found on the floor by patient's family member today.  Patient states he may have fallen last night and has remained on the floor since.  Patient states he does not recall exactly when he fell.  He drinks alcohol every day.  Denies any chest pain shortness of breath palpitation nausea vomiting abdominal pain or diarrhea.  Interim history Admitted for sycnope, atrial flutter with RVR.  He was also noted to have a periorbital hematoma and nasal bone fracture upon admission.  Assessment & Plan   New Onset Atrial Flutter with RVR -likely precipitated by heavy alcohol use vs pneumonia vs electrolyte disturbances -Cardiology consulted and appreciated -Initially required Cardizem drip, currently transitioned to oral regimen, and his metoprolol is been gradually uptitrated by cardiology, remains elevated, in the low 90s when at rest, his metoprolol uptitrated again today by cardiology, he is on 100 mg oral metoprolol twice daily . -Continue to replace magnesium around 2 and keep potassium around 4 -TSH 3.960, WNL -Echocardiogram EF 55-60%, no regional wall motion abnormalities  -He is very poor anticoagulation candidate, given his alcohol use, and multiple falls, he will need to abstain from alcohol use before consideration for long-term anticoagulation .  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  Ointment has been scheduled in outpatient setting for them on 11/09/2018 -Repeat CXR reviewed, still shows RLL cavitary lesion -Still requiring oxygen, with abdominal breathing pattern, reports this is for last few years, he was encouraged to use incentive spirometry and flutter valve again today  Syncope/Fall -Suspect secondary to atrial flutter in conjunction with alcohol -Trauma work-up was unremarkable except for nasal fracture -Continue supportive care -CT chest negative for PE -Echocardiogram as above -PT/OT consulted, recommended SNF  Prolonged QT -Patient was given azithromycin which is now been discontinued -Continue to monitor closely, potassium is 4.9 today, magnesium is 1.8  Hypomagnesemia -Mag 1.5, will replace again today -continue to monitor   Alcohol dependence -Does not appear to have alcohol withdrawal -Drinks approximately half a gallon of vodka per day -Continue CIWA protocol  Diabetes mellitus, type 2 -metformin held -Continue ISS and CBG monitoring  PTSD/bipolar disorder/schizophrenia -Previously was on Risperdal, quetiapine, but has been off of these medications for more than 1 year -Need to have outpatient follow-up  DVT Prophylaxis  SCDs  Code Status: Full  Family Communication: None at bedside  Disposition Plan: Admitted. Pending cardio recommendations and SNF placement  Consultants Pulmonology Cardiology  Procedures  Echocardiogram  Antibiotics   Anti-infectives (From admission, onward)   Start     Dose/Rate Route Frequency Ordered Stop   10/12/18 2100  azithromycin (ZITHROMAX) 500 mg in sodium chloride 0.9 % 250 mL IVPB  Status:  Discontinued     500 mg 250 mL/hr  over 60 Minutes Intravenous Every 24 hours 10/11/18 2246 10/12/18 1810    10/12/18 2000  cefTRIAXone (ROCEPHIN) 1 g in sodium chloride 0.9 % 100 mL IVPB  Status:  Discontinued     1 g 200 mL/hr over 30 Minutes Intravenous Every 24 hours 10/11/18 2246 10/12/18 1810   10/12/18 1900  ampicillin-sulbactam (UNASYN) 1.5 g in sodium chloride 0.9 % 100 mL IVPB     1.5 g 200 mL/hr over 30 Minutes Intravenous Every 6 hours 10/12/18 1812     10/11/18 1930  levofloxacin (LEVAQUIN) IVPB 750 mg     750 mg 100 mL/hr over 90 Minutes Intravenous  Once 10/11/18 1923 10/11/18 2159      Subjective:   Albert BlendMark Hess seen and examined today.  Denies any complaints today, no nausea, no vomiting,   Objective:   Vitals:   10/17/18 2341 10/18/18 0800 10/18/18 0920 10/18/18 1047  BP: (!) 141/71 (!) 154/90 129/78 130/77  Pulse:  98 99 (!) 104  Resp:    (!) 29  Temp:      TempSrc:      SpO2:   98% 94%  Weight:      Height:        Intake/Output Summary (Last 24 hours) at 10/18/2018 1148 Last data filed at 10/18/2018 1138 Gross per 24 hour  Intake 370.52 ml  Output 2055 ml  Net -1684.48 ml   Filed Weights   10/15/18 0416 10/16/18 0549 10/17/18 0624  Weight: 113.4 kg 115.5 kg 115.8 kg   Exam  Awake Alert, Oriented X 3, No new F.N deficits, Normal affect, patient with nasal/right periorbital hematoma and bruising from fall, improving Symmetrical Chest wall movement, air entry at the bases, clear to auscultation, using abdominal muscles while breathing, reports this is a chronic, for last 3 years. Irregular irregular,No Gallops,Rubs or new Murmurs, No Parasternal Heave +ve B.Sounds, Abd Soft, No tenderness, No rebound - guarding or rigidity. No Cyanosis, Clubbing or edema, No new Rash or bruise     Data Reviewed: I have personally reviewed following labs and imaging studies  CBC: Recent Labs  Lab 10/11/18 1551 10/12/18 0427 10/16/18 0553 10/17/18 0649  WBC 5.3 4.8  --   --   NEUTROABS 3.9 3.5  --   --   HGB 16.8 12.8* 12.8* 12.4*  HCT 50.0 36.2* 37.0* 36.1*  MCV  102.9* 99.7  --   --   PLT PLATELET CLUMPS NOTED ON SMEAR, UNABLE TO ESTIMATE 138*  --   --    Basic Metabolic Panel: Recent Labs  Lab 10/12/18 1326 10/13/18 0640 10/14/18 1232 10/15/18 0335 10/16/18 0553 10/17/18 0649 10/18/18 0406  NA  --  135 134*  --  133* 135 135  K  --  3.8 4.2  --  4.7 4.9 4.9  CL  --  102 100  --  96* 97* 95*  CO2  --  23 28  --  27 31 32  GLUCOSE  --  134* 141*  --  105* 113* 108*  BUN  --  12 11  --  11 10 9   CREATININE  --  0.87 0.83  --  0.78 0.86 0.85  CALCIUM  --  7.5* 8.2*  --  8.3* 8.6* 8.6*  MG 1.1* 1.4*  --  1.5* 1.5* 1.8  --    GFR: Estimated Creatinine Clearance: 104.7 mL/min (by C-G formula based on SCr of 0.85 mg/dL). Liver Function Tests: Recent Labs  Lab 10/11/18 1552 10/12/18 0427  AST 93* 55*  ALT 60* 43  ALKPHOS 62 49  BILITOT 3.1* 2.0*  PROT 6.8 5.3*  ALBUMIN 3.7 2.8*   No results for input(s): LIPASE, AMYLASE in the last 168 hours. No results for input(s): AMMONIA in the last 168 hours. Coagulation Profile: No results for input(s): INR, PROTIME in the last 168 hours. Cardiac Enzymes: Recent Labs  Lab 10/11/18 1552 10/11/18 2223 10/12/18 0427 10/12/18 1027  CKTOTAL 287  --   --   --   TROPONINI  --  <0.03 <0.03 <0.03   BNP (last 3 results) No results for input(s): PROBNP in the last 8760 hours. HbA1C: No results for input(s): HGBA1C in the last 72 hours. CBG: Recent Labs  Lab 10/17/18 0740 10/17/18 1117 10/17/18 1651 10/17/18 2148 10/18/18 0737  GLUCAP 92 145* 113* 119* 101*   Lipid Profile: No results for input(s): CHOL, HDL, LDLCALC, TRIG, CHOLHDL, LDLDIRECT in the last 72 hours. Thyroid Function Tests: No results for input(s): TSH, T4TOTAL, FREET4, T3FREE, THYROIDAB in the last 72 hours. Anemia Panel: No results for input(s): VITAMINB12, FOLATE, FERRITIN, TIBC, IRON, RETICCTPCT in the last 72 hours. Urine analysis:    Component Value Date/Time   COLORURINE AMBER (A) 10/12/2018 0549    APPEARANCEUR CLEAR 10/12/2018 0549   LABSPEC 1.032 (H) 10/12/2018 0549   PHURINE 5.0 10/12/2018 0549   GLUCOSEU 50 (A) 10/12/2018 0549   HGBUR SMALL (A) 10/12/2018 0549   BILIRUBINUR NEGATIVE 10/12/2018 0549   KETONESUR 20 (A) 10/12/2018 0549   PROTEINUR NEGATIVE 10/12/2018 0549   UROBILINOGEN 0.2 03/18/2013 0050   NITRITE NEGATIVE 10/12/2018 0549   LEUKOCYTESUR NEGATIVE 10/12/2018 0549   Sepsis Labs: @LABRCNTIP (procalcitonin:4,lacticidven:4)  ) Recent Results (from the past 240 hour(s))  Blood culture (routine x 2)     Status: None   Collection Time: 10/11/18 11:55 PM  Result Value Ref Range Status   Specimen Description BLOOD RIGHT FOREARM  Final   Special Requests   Final    BOTTLES DRAWN AEROBIC ONLY Blood Culture adequate volume   Culture   Final    NO GROWTH 5 DAYS Performed at [facility], [address], [place], [place] [address]    Report Status 10/17/2018 FINAL  Final  Blood culture (routine x 2)     Status: None   Collection Time: 10/12/18 12:05 AM  Result Value Ref Range Status   Specimen Description BLOOD RIGHT HAND  Final   Special Requests   Final    BOTTLES DRAWN AEROBIC ONLY Blood Culture adequate volume   Culture   Final    NO GROWTH 5 DAYS Performed at [facility], [address] [place], [place] [address]    Report Status 10/17/2018 FINAL  Final      Radiology Studies: No results found.   Scheduled Meds: . folic acid  1 mg Oral Daily  . insulin aspart  0-9 Units Subcutaneous TID WC  . LORazepam  1 mg Oral BID  . metoprolol tartrate  100 mg Oral BID  . thiamine  100 mg Oral Daily   Continuous Infusions: . ampicillin-sulbactam (UNASYN) IV 1.5 g (10/18/18 0649)     LOS: 6 days   Huey Bienenstock MD. on 10/18/2018 at 11:48 AM  Between 7am to 7pm - Please see pager noted on [email or web address]  After 7pm go to [email or web address]  And look for the night coverage person covering for me after hours  [facility] Office   [phone]

## 2018-10-18 NOTE — Telephone Encounter (Signed)
-----   Message from Marcelino Duster, [place] sent at 10/18/2018  1:21 PM EST ----- Please schedule patient for hospital follow up with Dr. Antoine Poche or an APP in 2-3 weeks.  Thanks Angie

## 2018-10-18 NOTE — Social Work (Addendum)
Patient's PASRR is under [facility] review. Patient will be evaluated in person by [facility] evaluator.   Called [employer] and extended patient's SNF authorization 1/30 - 10/20/2018.   CSW to follow.  Abigail Butts, LCSW [phone]

## 2018-10-19 ENCOUNTER — Encounter: Payer: Self-pay | Admitting: Cardiology

## 2018-10-19 DIAGNOSIS — J441 Chronic obstructive pulmonary disease with (acute) exacerbation: Secondary | ICD-10-CM | POA: Diagnosis not present

## 2018-10-19 DIAGNOSIS — Z79899 Other long term (current) drug therapy: Secondary | ICD-10-CM | POA: Diagnosis not present

## 2018-10-19 DIAGNOSIS — R0602 Shortness of breath: Secondary | ICD-10-CM | POA: Diagnosis not present

## 2018-10-19 DIAGNOSIS — R262 Difficulty in walking, not elsewhere classified: Secondary | ICD-10-CM | POA: Diagnosis not present

## 2018-10-19 DIAGNOSIS — E872 Acidosis: Secondary | ICD-10-CM | POA: Diagnosis not present

## 2018-10-19 DIAGNOSIS — Z9181 History of falling: Secondary | ICD-10-CM | POA: Diagnosis not present

## 2018-10-19 DIAGNOSIS — Y901 Blood alcohol level of 20-39 mg/100 ml: Secondary | ICD-10-CM | POA: Diagnosis not present

## 2018-10-19 DIAGNOSIS — J9621 Acute and chronic respiratory failure with hypoxia: Secondary | ICD-10-CM | POA: Diagnosis not present

## 2018-10-19 DIAGNOSIS — F1020 Alcohol dependence, uncomplicated: Secondary | ICD-10-CM | POA: Diagnosis not present

## 2018-10-19 DIAGNOSIS — I4892 Unspecified atrial flutter: Secondary | ICD-10-CM | POA: Diagnosis not present

## 2018-10-19 DIAGNOSIS — F10129 Alcohol abuse with intoxication, unspecified: Secondary | ICD-10-CM | POA: Diagnosis not present

## 2018-10-19 DIAGNOSIS — I4891 Unspecified atrial fibrillation: Secondary | ICD-10-CM | POA: Diagnosis not present

## 2018-10-19 DIAGNOSIS — S0990XA Unspecified injury of head, initial encounter: Secondary | ICD-10-CM | POA: Diagnosis not present

## 2018-10-19 DIAGNOSIS — E7800 Pure hypercholesterolemia, unspecified: Secondary | ICD-10-CM | POA: Diagnosis not present

## 2018-10-19 DIAGNOSIS — E785 Hyperlipidemia, unspecified: Secondary | ICD-10-CM | POA: Diagnosis not present

## 2018-10-19 DIAGNOSIS — I4581 Long QT syndrome: Secondary | ICD-10-CM | POA: Diagnosis not present

## 2018-10-19 DIAGNOSIS — Z9981 Dependence on supplemental oxygen: Secondary | ICD-10-CM | POA: Diagnosis not present

## 2018-10-19 DIAGNOSIS — Z794 Long term (current) use of insulin: Secondary | ICD-10-CM | POA: Diagnosis not present

## 2018-10-19 DIAGNOSIS — F4310 Post-traumatic stress disorder, unspecified: Secondary | ICD-10-CM | POA: Diagnosis not present

## 2018-10-19 DIAGNOSIS — Z7401 Bed confinement status: Secondary | ICD-10-CM | POA: Diagnosis not present

## 2018-10-19 DIAGNOSIS — D649 Anemia, unspecified: Secondary | ICD-10-CM | POA: Diagnosis not present

## 2018-10-19 DIAGNOSIS — J9622 Acute and chronic respiratory failure with hypercapnia: Secondary | ICD-10-CM | POA: Diagnosis not present

## 2018-10-19 DIAGNOSIS — Y92009 Unspecified place in unspecified non-institutional (private) residence as the place of occurrence of the external cause: Secondary | ICD-10-CM | POA: Diagnosis not present

## 2018-10-19 DIAGNOSIS — F319 Bipolar disorder, unspecified: Secondary | ICD-10-CM | POA: Diagnosis not present

## 2018-10-19 DIAGNOSIS — Z6832 Body mass index (BMI) 32.0-32.9, adult: Secondary | ICD-10-CM | POA: Diagnosis not present

## 2018-10-19 DIAGNOSIS — M2550 Pain in unspecified joint: Secondary | ICD-10-CM | POA: Diagnosis not present

## 2018-10-19 DIAGNOSIS — E119 Type 2 diabetes mellitus without complications: Secondary | ICD-10-CM | POA: Diagnosis not present

## 2018-10-19 DIAGNOSIS — J80 Acute respiratory distress syndrome: Secondary | ICD-10-CM | POA: Diagnosis not present

## 2018-10-19 DIAGNOSIS — Z818 Family history of other mental and behavioral disorders: Secondary | ICD-10-CM | POA: Diagnosis not present

## 2018-10-19 DIAGNOSIS — J9602 Acute respiratory failure with hypercapnia: Secondary | ICD-10-CM | POA: Diagnosis not present

## 2018-10-19 DIAGNOSIS — I483 Typical atrial flutter: Secondary | ICD-10-CM | POA: Diagnosis not present

## 2018-10-19 DIAGNOSIS — F209 Schizophrenia, unspecified: Secondary | ICD-10-CM | POA: Diagnosis not present

## 2018-10-19 DIAGNOSIS — J9601 Acute respiratory failure with hypoxia: Secondary | ICD-10-CM | POA: Diagnosis not present

## 2018-10-19 DIAGNOSIS — J984 Other disorders of lung: Secondary | ICD-10-CM | POA: Diagnosis not present

## 2018-10-19 DIAGNOSIS — S0121XD Laceration without foreign body of nose, subsequent encounter: Secondary | ICD-10-CM | POA: Diagnosis not present

## 2018-10-19 DIAGNOSIS — J690 Pneumonitis due to inhalation of food and vomit: Secondary | ICD-10-CM | POA: Diagnosis not present

## 2018-10-19 DIAGNOSIS — Z87891 Personal history of nicotine dependence: Secondary | ICD-10-CM | POA: Diagnosis not present

## 2018-10-19 DIAGNOSIS — Z23 Encounter for immunization: Secondary | ICD-10-CM | POA: Diagnosis not present

## 2018-10-19 DIAGNOSIS — F1010 Alcohol abuse, uncomplicated: Secondary | ICD-10-CM | POA: Diagnosis not present

## 2018-10-19 DIAGNOSIS — E669 Obesity, unspecified: Secondary | ICD-10-CM | POA: Diagnosis not present

## 2018-10-19 DIAGNOSIS — W1830XA Fall on same level, unspecified, initial encounter: Secondary | ICD-10-CM | POA: Diagnosis not present

## 2018-10-19 DIAGNOSIS — I10 Essential (primary) hypertension: Secondary | ICD-10-CM | POA: Diagnosis not present

## 2018-10-19 LAB — GLUCOSE, CAPILLARY
Glucose-Capillary: 101 mg/dL — ABNORMAL HIGH (ref 70–99)
Glucose-Capillary: 120 mg/dL — ABNORMAL HIGH (ref 70–99)
Glucose-Capillary: 109 mg/dL — ABNORMAL HIGH (ref 70–99)

## 2018-10-19 MED ORDER — INSULIN ASPART 100 UNIT/ML ~~LOC~~ SOLN: 0.0000 [IU] | Freq: Three times a day (TID) | SUBCUTANEOUS | 11 refills | Status: AC

## 2018-10-19 MED ORDER — ACETAMINOPHEN 325 MG PO TABS: 650.0000 mg | ORAL_TABLET | Freq: Four times a day (QID) | ORAL | Status: DC | PRN

## 2018-10-19 MED ORDER — METOPROLOL TARTRATE 100 MG PO TABS: 100.0000 mg | ORAL_TABLET | Freq: Two times a day (BID) | ORAL | Status: AC

## 2018-10-19 NOTE — Telephone Encounter (Signed)
Letter was sent to patient

## 2018-10-19 NOTE — Discharge Summary (Signed)
Randon GoldsmithMark M Louvier, is a 73 y.o. male  DOB 01/18/1946  MRN [id number].  Admission date:  10/11/2018  Admitting Physician  Eduard ClosArshad N Kakrakandy, MD  Discharge Date:  10/19/2018   Primary MD  Wilfrid LundBecker, Anna G, PA  Recommendations for primary care physician for things to follow:  -Please encourage to use incentive spirometry -Currently on 3 L nasal cannula, wean as tolerated -Patient to follow with lobar pulmonary as an outpatient, appointment scheduled on February 21, please see appointment below   Admission Diagnosis  Laceration of nose, initial encounter [S01.21XA]   Discharge Diagnosis  Laceration of nose, initial encounter [S01.21XA]    Principal Problem:   Atrial flutter with rapid ventricular response (HCC) Active Problems:   Hyponatremia   Thrombocytopenia (HCC)   COPD exacerbation (HCC)   Essential hypertension   Diabetes mellitus without complication (HCC)   Tobacco abuse   Laceration of nose   Aspiration pneumonia (HCC)   Alcohol abuse   Cavitary lesion of lung   ETOH abuse      Past Medical History:  Diagnosis Date  . Alcohol abuse   . Atrial flutter (HCC)   . Bipolar 2 disorder (HCC)   . COPD (chronic obstructive pulmonary disease) (HCC)   . Diabetes mellitus without complication (HCC)   . High cholesterol   . Hypertension   . Obesity   . PTSD (post-traumatic stress disorder)   . Schizophrenia [facility](HCC)     Past Surgical History:  Procedure Laterality Date  . right side     pt points to R side for surgery       History of present illness and  Hospital Course:     Kindly see H&P for history of present illness and admission details, please review complete Labs, Consult reports and Test reports for all details in brief  HPI  from the history and physical done on the day of admission 10/11/2018  HPI: Randon GoldsmithMark M Dancel is a 73 y.o. male with history of alcohol abuse drinks vodka every  day, diabetes mellitus type 2 on metformin, history of hypertension presently not on medication and history of bipolar disorder per the chart was brought to the ER the patient was found on the floor by patient's family member today.  Patient states he may have fallen last night and has remained on the floor since.  Patient states he does not recall exactly when he fell.  He drinks alcohol every day.  Denies any chest pain shortness of breath palpitation nausea vomiting abdominal pain or diarrhea.  ED Course: In the ER patient was found to be in atrial flutter with RVR.  Has multiple bruises on the face and extremities.  Has a periorbital hematoma on the right side and nasal bone fracture.  Some of the lacerations were Dermabonded.  Patient had CT head CT maxillofacial C-spine and CT angiogram of the chest.  CT scan showed a nasal bone fracture and CT angiogram of the chest showed progression of the patient's right-sided cystic lesion of the lung  with air-fluid level.  Also was concern for pneumonia for which patient was started on antibiotics.  Since patient is in atrial flutter with RVR patient is started on Cardizem infusion.  Hospital Course   New Onset Atrial Flutter with RVR -likely precipitated by heavy alcohol use vs pneumonia vs electrolyte disturbances -Cardiology consulted and appreciated -Initially required Cardizem drip, currently transitioned to oral regimen, and his metoprolol is been gradually uptitrated by cardiology, continue metoprolol 100 mg oral twice daily, with good heart rate control. -TSH 3.960, WNL -Echocardiogram EF 55-60%, no regional wall motion abnormalities  -He is very poor anticoagulation candidate, given his alcohol use, and multiple falls, he will need to abstain from alcohol use before consideration for long-term anticoagulation .  Acute hypoxic respiratory failure -Secondary to below, with pneumonia and right cavitary lesion, he remains on 3 L nasal cannula, wean  as tolerated, he was encouraged to continue using his flutter valve and incentive spirometry after discharge  Patchy infiltrates with chronic right cavitary lung lesion/Possible Asp pneumonia -Pulmonology consulted and appreciated -Was initially placed on azithromycin and ceftriaxone, transitioned to Unasyn for aspiration coverage,  to a total of 7 days of IV antibiotics, no further antibiotics on discharge -Likely partially treated lung abscess in the remote past -Pulmonology does not recommend bronchoscopy at this point, recommending outpatient repeat CT in 3 months.  has been scheduled in outpatient setting follow-up with them on 11/09/2018 -Repeat CXR reviewed, still shows RLL cavitary lesion -Still requiring oxygen, with abdominal breathing pattern, reports this is for last few years, he was encouraged to use incentive spirometry and flutter valve again today  Syncope/Fall -Suspect secondary to atrial flutter in conjunction with alcohol -Trauma work-up was unremarkable except for nasal fracture -Continue supportive care -CT chest negative for PE -Echocardiogram as above -PT/OT consulted, recommended SNF  Prolonged QT -Patient was given azithromycin which is now been discontinued  Hypomagnesemia -repleted  Alcohol dependence -Does not appear to have alcohol withdrawal, continue with thiamine and multivitamins on discharge -Drinks approximately half a gallon of vodka per day -Was on CIWA protocol during hospital stay  Diabetes mellitus, type 2 -metformin held -Continue [facility] facility  PTSD/bipolar disorder/schizophrenia -Previously was on Risperdal, quetiapine, but has been off of these medications for more than 1 year -Need to have outpatient follow-up    Discharge Condition:  stable   Follow UP   Contact information for follow-up providers    Glenford Bayley, NP Follow up on 11/09/2018.   Specialty:  Pulmonary Disease Why:  Appt at 11:00 AM.  Please arrive  at 10:45 am for check in.  The office will call you about repeat CT imaging.  Contact information: [address] [place] [address] [phone]            Contact information for after-discharge care    Destination    [facility] Preferred SNF .   Service:  Skilled Nursing Contact information: [address] [place] [address] [phone]                    Discharge Instructions  and  Discharge Medications     Discharge Instructions    Discharge instructions   Complete by:  As directed    Follow with Primary MD Wilfrid Lund, PA or SNF physician  Get CBC, CMP,  checked  by Primary MD next visit.    Activity: As tolerated with Full fall precautions use walker/cane & assistance as needed  Disposition SNF   Diet: Heart Healthy/carb modified   , with feeding assistance and aspiration precautions.  Feed only when sitting in upright position, with full assistance  For Heart failure patients - Check your Weight same time everyday, if you gain over 2 pounds, or you develop in leg swelling, experience more shortness of breath or chest pain, call your Primary MD immediately. Follow Cardiac Low Salt Diet and 1.5 lit/day fluid restriction.   On your next visit with your primary care physician please Get Medicines reviewed and adjusted.   Please request your Prim.MD to go over all Hospital Tests and Procedure/Radiological results at the follow up, please get all Hospital records sent to your Prim MD by signing hospital release before you go home.   If you experience worsening of your admission symptoms, develop shortness of breath, life threatening emergency, suicidal or homicidal thoughts you must seek medical attention immediately by calling 911 or calling your MD immediately  if symptoms less severe.  You Must read complete instructions/literature along with all the possible adverse reactions/side effects for all the  Medicines you take and that have been prescribed to you. Take any new Medicines after you have completely understood and accpet all the possible adverse reactions/side effects.   Do not drive, operating heavy machinery, perform activities at heights, swimming or participation in water activities or provide baby sitting services if your were admitted for syncope or siezures until you have seen by Primary MD or a Neurologist and advised to do so again.  Do not drive when taking Pain medications.    Do not take more than prescribed Pain, Sleep and Anxiety Medications  Special Instructions: If you have smoked or chewed Tobacco  in the last 2 yrs please stop smoking, stop any regular Alcohol  and or any Recreational drug use.  Wear Seat belts while driving.   Please note  You were cared for by a hospitalist during your hospital stay. If you have any questions about your discharge medications or the care you received while you were in the hospital after you are discharged, you can call the unit and asked to speak with the hospitalist on call if the hospitalist that took care of you is not available. Once you are discharged, your primary care physician will handle any further medical issues. Please note that NO REFILLS for any discharge medications will be authorized once you are discharged, as it is imperative that you return to your primary care physician (or establish a relationship with a primary care physician if you do not have one) for your aftercare needs so that they can reassess your need for medications and monitor your lab values.   Increase activity slowly   Complete by:  As directed      Allergies as of 10/19/2018   No Known Allergies     Medication List    STOP taking these medications   amLODipine 5 MG tablet Commonly known as:  NORVASC   metFORMIN 500 MG tablet Commonly known as:  GLUCOPHAGE   predniSONE 10 MG tablet Commonly known as:  DELTASONE     TAKE these  medications   acetaminophen 325 MG tablet Commonly known as:  TYLENOL Take 2 tablets (650 mg total) by mouth every 6 (six) hours as needed for mild pain, fever or headache (or Fever >/= 101).   benzonatate 200 MG capsule Commonly known as:  TESSALON Take 1 capsule (200 mg total) by mouth 3 (three) times daily as needed for  cough.   fluticasone 50 MCG/ACT nasal spray Commonly known as:  FLONASE Place 2 sprays into both nostrils daily.   folic acid 1 MG tablet Commonly known as:  FOLVITE Take 1 tablet (1 mg total) by mouth daily.   insulin aspart 100 UNIT/ML injection Commonly known as:  novoLOG Inject 0-9 Units into the skin 3 (three) times daily with meals.   Ipratropium-Albuterol 20-100 MCG/ACT Aers respimat Commonly known as:  COMBIVENT RESPIMAT Inhale 1 puff into the lungs every 6 (six) hours as needed for wheezing or shortness of breath.   metoprolol tartrate 100 MG tablet Commonly known as:  LOPRESSOR Take 1 tablet (100 mg total) by mouth 2 (two) times daily.   multivitamin with minerals Tabs tablet Take 1 tablet by mouth daily.   nicotine 21 mg/24hr patch Commonly known as:  NICODERM CQ - dosed in mg/24 hours Place 1 patch (21 mg total) onto the skin daily.   pravastatin 40 MG tablet Commonly known as:  PRAVACHOL Take 1 tablet (40 mg total) by mouth daily.   thiamine 100 MG tablet Take 1 tablet (100 mg total) by mouth daily.         Diet and Activity recommendation: See Discharge Instructions above   Consults obtained -  Pulmonary   Major procedures and Radiology Reports - PLEASE review detailed and final reports for all details, in brief -      Dg Chest 2 View  Result Date: 10/16/2018 CLINICAL DATA:  Shortness of Breath EXAM: CHEST - 2 VIEW COMPARISON:  10/14/2018 FINDINGS: Stable cavitary lesion is noted in the right lower lobe with air-fluid level within. No focal infiltrate is seen. Cardiac shadow is stable. No bony abnormality is noted.  IMPRESSION: Stable appearance of right lower lobe cavitary lesion. No acute abnormality noted. Electronically Signed   By: Alcide Clever M.D.   On: 10/16/2018 09:46   Ct Head Wo Contrast  Result Date: 10/11/2018 CLINICAL DATA:  Neck and back pain and nasal aspiration following a fall today while intoxicated. EXAM: CT HEAD WITHOUT CONTRAST CT MAXILLOFACIAL WITHOUT CONTRAST CT CERVICAL SPINE WITHOUT CONTRAST TECHNIQUE: Multidetector CT imaging of the head, cervical spine, and maxillofacial structures were performed using the standard protocol without intravenous contrast. Multiplanar CT image reconstructions of the cervical spine and maxillofacial structures were also generated. COMPARISON:  Head and cervical spine CT dated 03/16/2013. FINDINGS: CT HEAD FINDINGS Brain: Mild-to-moderate enlargement of the ventricles and subarachnoid spaces with mild progression. Mild to moderate patchy white matter low density in both cerebral hemispheres with mild progression. No intracranial hemorrhage, mass lesion or CT evidence of acute infarction. Vascular: No hyperdense vessel or unexpected calcification. Skull: Normal. Negative for fracture or focal lesion. Other: None. CT MAXILLOFACIAL FINDINGS Osseous: Interval fracture of the mid nasal septum with angulation and mild displacement of the anterior portion to the left. Small fracture of the inferior aspect of the distal nasal bone a mild inferiorly and posteriorly displaced fragment. Orbits: Intact. Sinuses: Moderate right inferior maxillary sinus mucosal thickening. Mild left maxillary sinus mucosal thickening with a small amount of fluid in the sinus. Soft tissues: Anterior nasal soft tissue irregularity superiorly with multiple small locules of air within the underlying soft tissues. CT CERVICAL SPINE FINDINGS Alignment: Mild reversal of the normal cervical lordosis. No subluxations. Skull base and vertebrae: No acute fracture. No primary bone lesion or focal pathologic  process. Soft tissues and spinal canal: No prevertebral fluid or swelling. No visible canal hematoma. Disc levels: Multilevel degenerative changes, most  pronounced at the C5-6 and C6-7 levels. Upper chest: Clear lung apices. Other: Bilateral carotid artery calcifications. IMPRESSION: 1. Interval fracture of the mid nasal septum with angulation and mild displacement of the anterior portion to the left. 2. No skull fracture or intracranial hemorrhage. 3. No cervical spine fracture or subluxation. 4. Mildly progressive atrophy and chronic small vessel white matter ischemic changes in both cerebral hemispheres. 5. Cervical spine degenerative changes. 6. Bilateral carotid artery atheromatous calcifications. 7. Chronic bilateral maxillary sinusitis with an acute component or blood in the left maxillary sinus. Electronically Signed   By: Beckie Salts M.D.   On: 10/11/2018 16:27   Ct Angio Chest Pe W And/or Wo Contrast  Result Date: 10/11/2018 CLINICAL DATA:  Chest pain possible pulmonary embolism. EXAM: CT ANGIOGRAPHY CHEST WITH CONTRAST TECHNIQUE: Multidetector CT imaging of the chest was performed using the standard protocol during bolus administration of intravenous contrast. Multiplanar CT image reconstructions and MIPs were obtained to evaluate the vascular anatomy. CONTRAST:  ISOVUE-370 IOPAMIDOL (ISOVUE-370) INJECTION 76% COMPARISON:  Chest x-ray 10/11/2018 and 10/16/2016 FINDINGS: Cardiovascular: Heart is normal size. Calcified plaque over the left main and 3 vessel coronary arteries. Minimal calcified plaque over the thoracic aorta. Thoracic aorta is otherwise normal in caliber without evidence of dissection. Pulmonary arterial system is well opacified without evidence of emboli. Mediastinum/Nodes: No mediastinal or hilar adenopathy. Remaining mediastinal structures are normal. Lungs/Pleura: Lungs are adequately inflated with subtle hazy patchy airspace density bilaterally worse over the left lower lobe  likely due to an infectious or inflammatory process. No effusion. Known chronic thin-walled cavitary lesion with small air-fluid level over the right lower lobe measuring 6.1 x 6.2 cm in AP and transverse dimension. This continues to slowly increase in size. There is no evidence of mural nodularity or focal wall thickening. 2 mm subpleural nodule over the lateral right upper lobe. Airways are otherwise unremarkable. Upper Abdomen: Mild diffuse low-attenuation of the liver. Minimal calcified plaque over the abdominal aorta. Mild cholelithiasis. Musculoskeletal: Degenerative change of the spine. Review of the MIP images confirms the above findings. IMPRESSION: No evidence of pulmonary embolism. Subtle bilateral patchy hazy airspace process worse over the left lower lobe likely an acute infectious or inflammatory process. Chronic slowly increase in size of a thin-walled cavitary lesion over the right lower lobe with small air-fluid level. No suspicious wall thickening or mural nodularity. Aortic Atherosclerosis (ICD10-I70.0). Atherosclerotic coronary artery disease. Cholelithiasis. Mild hepatic steatosis. Electronically Signed   By: Elberta Fortis M.D.   On: 10/11/2018 19:11   Ct Cervical Spine Wo Contrast  Result Date: 10/11/2018 CLINICAL DATA:  Neck and back pain and nasal aspiration following a fall today while intoxicated. EXAM: CT HEAD WITHOUT CONTRAST CT MAXILLOFACIAL WITHOUT CONTRAST CT CERVICAL SPINE WITHOUT CONTRAST TECHNIQUE: Multidetector CT imaging of the head, cervical spine, and maxillofacial structures were performed using the standard protocol without intravenous contrast. Multiplanar CT image reconstructions of the cervical spine and maxillofacial structures were also generated. COMPARISON:  Head and cervical spine CT dated 03/16/2013. FINDINGS: CT HEAD FINDINGS Brain: Mild-to-moderate enlargement of the ventricles and subarachnoid spaces with mild progression. Mild to moderate patchy white matter  low density in both cerebral hemispheres with mild progression. No intracranial hemorrhage, mass lesion or CT evidence of acute infarction. Vascular: No hyperdense vessel or unexpected calcification. Skull: Normal. Negative for fracture or focal lesion. Other: None. CT MAXILLOFACIAL FINDINGS Osseous: Interval fracture of the mid nasal septum with angulation and mild displacement of the anterior portion to the  left. Small fracture of the inferior aspect of the distal nasal bone a mild inferiorly and posteriorly displaced fragment. Orbits: Intact. Sinuses: Moderate right inferior maxillary sinus mucosal thickening. Mild left maxillary sinus mucosal thickening with a small amount of fluid in the sinus. Soft tissues: Anterior nasal soft tissue irregularity superiorly with multiple small locules of air within the underlying soft tissues. CT CERVICAL SPINE FINDINGS Alignment: Mild reversal of the normal cervical lordosis. No subluxations. Skull base and vertebrae: No acute fracture. No primary bone lesion or focal pathologic process. Soft tissues and spinal canal: No prevertebral fluid or swelling. No visible canal hematoma. Disc levels: Multilevel degenerative changes, most pronounced at the C5-6 and C6-7 levels. Upper chest: Clear lung apices. Other: Bilateral carotid artery calcifications. IMPRESSION: 1. Interval fracture of the mid nasal septum with angulation and mild displacement of the anterior portion to the left. 2. No skull fracture or intracranial hemorrhage. 3. No cervical spine fracture or subluxation. 4. Mildly progressive atrophy and chronic small vessel white matter ischemic changes in both cerebral hemispheres. 5. Cervical spine degenerative changes. 6. Bilateral carotid artery atheromatous calcifications. 7. Chronic bilateral maxillary sinusitis with an acute component or blood in the left maxillary sinus. Electronically Signed   By: Beckie Salts M.D.   On: 10/11/2018 16:27   Dg Pelvis  Portable  Result Date: 10/11/2018 CLINICAL DATA:  Larey Seat in home today EXAM: PORTABLE PELVIS 1-2 VIEWS COMPARISON:  Portable exam 1506 hours without priors for comparison FINDINGS: Osseous mineralization normal. Hip and SI joint spaces preserved. No acute fracture, dislocation, or bone destruction. IMPRESSION: No acute osseous abnormalities. Electronically Signed   By: Ulyses Southward M.D.   On: 10/11/2018 15:24   Dg Chest Port 1 View  Result Date: 10/14/2018 CLINICAL DATA:  Shortness of breath. Tachypnea. Paradoxical respiration. EXAM: PORTABLE CHEST 1 VIEW COMPARISON:  Radiograph and CT three days ago 10/11/2018 FINDINGS: The cardiomediastinal contours are normal. Thin-walled cyst in the right midlung measuring 6.8 x 6.7 cm is unchanged in the short interval. Ground-glass opacities on CT are not well visualized radiographically. Pulmonary vasculature is normal. No consolidation, pleural effusion, or pneumothorax. No acute osseous abnormalities are seen. IMPRESSION: 1. Unchanged radiographic appearance of the chest. Unchanged pulmonary cavitary lesion in the right midlung over the past 3 days. 2. Ground-glass opacities on CT have no radiographic correlate. Electronically Signed   By: Narda Rutherford M.D.   On: 10/14/2018 02:27   Dg Chest Portable 1 View  Result Date: 10/11/2018 CLINICAL DATA:  Larey Seat at home today, history hypertension, diabetes mellitus, COPD, smoker EXAM: PORTABLE CHEST 1 VIEW COMPARISON:  Portable exam 1506 hours compared to 10/16/2016 FINDINGS: Normal heart size, mediastinal contours, and pulmonary vascularity. 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 Lungs clear. No infiltrate, pleural effusion or pneumothorax. No acute osseous findings. IMPRESSION: Interval increase in size of an air cyst/pneumatocele in the mid RIGHT lung now 6.6 x 6.9 cm. Otherwise negative exam. Electronically Signed   By: Ulyses Southward M.D.   On: 10/11/2018 15:23    Ct Maxillofacial Wo Contrast  Result Date: 10/11/2018 CLINICAL DATA:  Neck and back pain and nasal aspiration following a fall today while intoxicated. EXAM: CT HEAD WITHOUT CONTRAST CT MAXILLOFACIAL WITHOUT CONTRAST CT CERVICAL SPINE WITHOUT CONTRAST TECHNIQUE: Multidetector CT imaging of the head, cervical spine, and maxillofacial structures were performed using the standard protocol without intravenous contrast. Multiplanar CT image reconstructions of the cervical spine and maxillofacial structures  were also generated. COMPARISON:  Head and cervical spine CT dated 03/16/2013. FINDINGS: CT HEAD FINDINGS Brain: Mild-to-moderate enlargement of the ventricles and subarachnoid spaces with mild progression. Mild to moderate patchy white matter low density in both cerebral hemispheres with mild progression. No intracranial hemorrhage, mass lesion or CT evidence of acute infarction. Vascular: No hyperdense vessel or unexpected calcification. Skull: Normal. Negative for fracture or focal lesion. Other: None. CT MAXILLOFACIAL FINDINGS Osseous: Interval fracture of the mid nasal septum with angulation and mild displacement of the anterior portion to the left. Small fracture of the inferior aspect of the distal nasal bone a mild inferiorly and posteriorly displaced fragment. Orbits: Intact. Sinuses: Moderate right inferior maxillary sinus mucosal thickening. Mild left maxillary sinus mucosal thickening with a small amount of fluid in the sinus. Soft tissues: Anterior nasal soft tissue irregularity superiorly with multiple small locules of air within the underlying soft tissues. CT CERVICAL SPINE FINDINGS Alignment: Mild reversal of the normal cervical lordosis. No subluxations. Skull base and vertebrae: No acute fracture. No primary bone lesion or focal pathologic process. Soft tissues and spinal canal: No prevertebral fluid or swelling. No visible canal hematoma. Disc levels: Multilevel degenerative changes, most  pronounced at the C5-6 and C6-7 levels. Upper chest: Clear lung apices. Other: Bilateral carotid artery calcifications. IMPRESSION: 1. Interval fracture of the mid nasal septum with angulation and mild displacement of the anterior portion to the left. 2. No skull fracture or intracranial hemorrhage. 3. No cervical spine fracture or subluxation. 4. Mildly progressive atrophy and chronic small vessel white matter ischemic changes in both cerebral hemispheres. 5. Cervical spine degenerative changes. 6. Bilateral carotid artery atheromatous calcifications. 7. Chronic bilateral maxillary sinusitis with an acute component or blood in the left maxillary sinus. Electronically Signed   By: Beckie Salts M.D.   On: 10/11/2018 16:27    Micro Results     Recent Results (from the past 240 hour(s))  Blood culture (routine x 2)     Status: None   Collection Time: 10/11/18 11:55 PM  Result Value Ref Range Status   Specimen Description BLOOD RIGHT FOREARM  Final   Special Requests   Final    BOTTLES DRAWN AEROBIC ONLY Blood Culture adequate volume   Culture   Final    NO GROWTH 5 DAYS Performed at [facility], [address], [place], [place] [address]    Report Status 10/17/2018 FINAL  Final  Blood culture (routine x 2)     Status: None   Collection Time: 10/12/18 12:05 AM  Result Value Ref Range Status   Specimen Description BLOOD RIGHT HAND  Final   Special Requests   Final    BOTTLES DRAWN AEROBIC ONLY Blood Culture adequate volume   Culture   Final    NO GROWTH 5 DAYS Performed at [facility], [address] [place], [place] [address]    Report Status 10/17/2018 FINAL  Final       Today   Subjective:   Sho Salguero today has no headache,no chest or abdominal pain, he denies any complaints. Objective:   Blood pressure 137/84, pulse 95, temperature 98.8 F (37.1 C), temperature source Oral, resp. rate (!) 28, height 6' (1.829 m), weight 115.8 kg, SpO2 94 %.   Intake/Output  Summary (Last 24 hours) at 10/19/2018 1215 Last data filed at 10/19/2018 0981 Gross per 24 hour  Intake 360 ml  Output 1100 ml  Net -740 ml    Exam Awake Alert,  Oriented x 3, No new F.N deficits, Normal affect, patient with nasal/right periorbital hematoma and bruising from fall, improving.  Symmetrical Chest wall movement, Good air movement bilaterally, diminished air entry at the bases, he is having abdominal breathing pattern, report this is chronic for few years Irregular irregular,No Gallops,Rubs or new Murmurs, No Parasternal Heave +ve B.Sounds, Abd Soft, Non tender, No organomegaly appriciated, No rebound -guarding or rigidity. No Cyanosis, Clubbing or edema, No new Rash or bruise  Data Review   CBC w Diff:  Lab Results  Component Value Date   WBC 4.8 10/12/2018   HGB 12.4 (L) 10/17/2018   HCT 36.1 (L) 10/17/2018   PLT 138 (L) 10/12/2018   LYMPHOPCT 12 10/12/2018   MONOPCT 13 10/12/2018   EOSPCT 0 10/12/2018   BASOPCT 0 10/12/2018    CMP:  Lab Results  Component Value Date   NA 135 10/18/2018   K 4.9 10/18/2018   CL 95 (L) 10/18/2018   CO2 32 10/18/2018   BUN 9 10/18/2018   CREATININE 0.85 10/18/2018   PROT 5.3 (L) 10/12/2018   ALBUMIN 2.8 (L) 10/12/2018   BILITOT 2.0 (H) 10/12/2018   ALKPHOS 49 10/12/2018   AST 55 (H) 10/12/2018   ALT 43 10/12/2018  .   Total Time in preparing paper work, data evaluation and todays exam - 35 minutes  Huey Bienenstock M.D on 10/19/2018 at 12:15 PM  [facility]   Office  [phone]

## 2018-10-19 NOTE — Care Management Important Message (Signed)
Important Message  Patient Details  Name: Albert Hess MRN: [id number] Date of Birth: 07-24-1946   Medicare Important Message Given:  Yes    Mellie Buccellato P Vishwa Dais 10/19/2018, 3:02 PM

## 2018-10-19 NOTE — Social Work (Signed)
Patient will discharge to [facility] Anticipated discharge date: 10/19/2018 Family notified: Carvel Getting, daughter Transportation by: [facility]  Nurse to call report to [phone]. Patient will go to room 119B at the facility.   CSW signing off.  Abigail Butts, LCSWA  Clinical Social Worker

## 2018-10-19 NOTE — Clinical Social Work Placement (Signed)
   CLINICAL SOCIAL WORK PLACEMENT  NOTE  Date:  10/19/2018  Patient Details  Name: Albert Hess MRN: [id number] Date of Birth: [birth year]  Clinical Social Work is seeking post-discharge placement for this patient at the Skilled  Nursing Facility level of care (*CSW will initial, date and re-position this form in  chart as items are completed):  Yes   Patient/family provided with [facility] Clinical Social Work Department's list of facilities offering this level of care within the geographic area requested by the patient (or if unable, by the patient's family).  Yes   Patient/family informed of their freedom to choose among providers that offer the needed level of care, that participate in Medicare, Medicaid or managed care program needed by the patient, have an available bed and are willing to accept the patient.  Yes   Patient/family informed of [facility]'s ownership interest in [facility] and [facility], as well as of the fact that they are under no obligation to receive care at these facilities.  PASRR submitted to EDS on 10/17/18     PASRR number received on 10/19/18     Existing PASRR number confirmed on       FL2 transmitted to all facilities in geographic area requested by pt/family on 10/15/18     FL2 transmitted to all facilities within larger geographic area on       Patient informed that his/her managed care company has contracts with or will negotiate with certain facilities, including the following:  [facility]     Yes   Patient/family informed of bed offers received.  Patient chooses bed at [facility]     Physician recommends and patient chooses bed at      Patient to be transferred to [facility] on 10/19/18.  Patient to be transferred to facility by [facility]     Patient family notified on 10/19/18 of transfer.  Name of family member notified:  Carvel Getting, daughter     PHYSICIAN Please prepare priority discharge summary,  including medications, Please prepare prescriptions, Please sign FL2     Additional Comment:    _______________________________________________ Abigail Butts, LCSW 10/19/2018, 11:25 AM

## 2018-10-19 NOTE — Plan of Care (Signed)
  Problem: Education: Goal: Knowledge of General Education information will improve Description Including pain rating scale, medication(s)/side effects and non-pharmacologic comfort measures Outcome: Progressing   

## 2018-10-19 NOTE — Progress Notes (Signed)
PT Cancellation Note  Patient Details Name: Albert Hess MRN: [id number] DOB: 28-Jul-1946   Cancelled Treatment:    Reason Eval/Treat Not Completed: Patient declined, no reason specified;Fatigue/lethargy limiting ability to participate Pt reports not feeling like working with PT this AM, due to being tired. Will follow.   Blake Divine A Alethia Melendrez 10/19/2018, 9:35 AM Mylo Red, PT, DPT Acute Rehabilitation Services Pager [phone] Office [phone]

## 2018-10-19 NOTE — Discharge Instructions (Signed)
Follow with Primary MD Wilfrid Lund, PA or SNF physician  Get CBC, CMP,  checked  by Primary MD next visit.    Activity: As tolerated with Full fall precautions use walker/cane & assistance as needed   Disposition SNF   Diet: Heart Healthy/carb modified   , with feeding assistance and aspiration precautions.  Feed only when sitting in upright position, with full assistance  For Heart failure patients - Check your Weight same time everyday, if you gain over 2 pounds, or you develop in leg swelling, experience more shortness of breath or chest pain, call your Primary MD immediately. Follow Cardiac Low Salt Diet and 1.5 lit/day fluid restriction.   On your next visit with your primary care physician please Get Medicines reviewed and adjusted.   Please request your Prim.MD to go over all Hospital Tests and Procedure/Radiological results at the follow up, please get all Hospital records sent to your Prim MD by signing hospital release before you go home.   If you experience worsening of your admission symptoms, develop shortness of breath, life threatening emergency, suicidal or homicidal thoughts you must seek medical attention immediately by calling 911 or calling your MD immediately  if symptoms less severe.  You Must read complete instructions/literature along with all the possible adverse reactions/side effects for all the Medicines you take and that have been prescribed to you. Take any new Medicines after you have completely understood and accpet all the possible adverse reactions/side effects.   Do not drive, operating heavy machinery, perform activities at heights, swimming or participation in water activities or provide baby sitting services if your were admitted for syncope or siezures until you have seen by Primary MD or a Neurologist and advised to do so again.  Do not drive when taking Pain medications.    Do not take more than prescribed Pain, Sleep and Anxiety  Medications  Special Instructions: If you have smoked or chewed Tobacco  in the last 2 yrs please stop smoking, stop any regular Alcohol  and or any Recreational drug use.  Wear Seat belts while driving.   Please note  You were cared for by a hospitalist during your hospital stay. If you have any questions about your discharge medications or the care you received while you were in the hospital after you are discharged, you can call the unit and asked to speak with the hospitalist on call if the hospitalist that took care of you is not available. Once you are discharged, your primary care physician will handle any further medical issues. Please note that NO REFILLS for any discharge medications will be authorized once you are discharged, as it is imperative that you return to your primary care physician (or establish a relationship with a primary care physician if you do not have one) for your aftercare needs so that they can reassess your need for medications and monitor your lab values.

## 2018-10-19 NOTE — Social Work (Signed)
Patient's PASRR completed - [id number] F valid 1/31 - 11/18/2018. Sent PASRR info to SNF. Updated MD. CSW to support with discharge.  Abigail Butts, LCSW [phone]

## 2018-10-21 ENCOUNTER — Encounter (HOSPITAL_COMMUNITY): Payer: Self-pay | Admitting: *Deleted

## 2018-10-21 ENCOUNTER — Other Ambulatory Visit: Payer: Self-pay

## 2018-10-21 ENCOUNTER — Emergency Department (HOSPITAL_COMMUNITY): Payer: Medicare Other

## 2018-10-21 ENCOUNTER — Inpatient Hospital Stay (HOSPITAL_COMMUNITY)
Admission: EM | Admit: 2018-10-21 | Discharge: 2018-10-24 | DRG: 190 | Disposition: A | Discharge status: Skilled Nursing Facility | Payer: Medicare Other | Source: Skilled Nursing Facility | Attending: Internal Medicine | Admitting: Internal Medicine

## 2018-10-21 DIAGNOSIS — R0602 Shortness of breath: Secondary | ICD-10-CM

## 2018-10-21 DIAGNOSIS — M6281 Muscle weakness (generalized): Secondary | ICD-10-CM | POA: Diagnosis not present

## 2018-10-21 DIAGNOSIS — Z794 Long term (current) use of insulin: Secondary | ICD-10-CM

## 2018-10-21 DIAGNOSIS — J984 Other disorders of lung: Secondary | ICD-10-CM

## 2018-10-21 DIAGNOSIS — R0902 Hypoxemia: Secondary | ICD-10-CM | POA: Diagnosis not present

## 2018-10-21 DIAGNOSIS — F319 Bipolar disorder, unspecified: Secondary | ICD-10-CM | POA: Diagnosis not present

## 2018-10-21 DIAGNOSIS — J9602 Acute respiratory failure with hypercapnia: Secondary | ICD-10-CM | POA: Diagnosis not present

## 2018-10-21 DIAGNOSIS — F1020 Alcohol dependence, uncomplicated: Secondary | ICD-10-CM | POA: Diagnosis present

## 2018-10-21 DIAGNOSIS — E669 Obesity, unspecified: Secondary | ICD-10-CM | POA: Diagnosis not present

## 2018-10-21 DIAGNOSIS — F4310 Post-traumatic stress disorder, unspecified: Secondary | ICD-10-CM | POA: Diagnosis not present

## 2018-10-21 DIAGNOSIS — Z818 Family history of other mental and behavioral disorders: Secondary | ICD-10-CM | POA: Diagnosis not present

## 2018-10-21 DIAGNOSIS — I4892 Unspecified atrial flutter: Secondary | ICD-10-CM | POA: Diagnosis not present

## 2018-10-21 DIAGNOSIS — R7989 Other specified abnormal findings of blood chemistry: Secondary | ICD-10-CM | POA: Diagnosis not present

## 2018-10-21 DIAGNOSIS — Z79899 Other long term (current) drug therapy: Secondary | ICD-10-CM

## 2018-10-21 DIAGNOSIS — Z9181 History of falling: Secondary | ICD-10-CM | POA: Diagnosis not present

## 2018-10-21 DIAGNOSIS — E119 Type 2 diabetes mellitus without complications: Secondary | ICD-10-CM | POA: Diagnosis not present

## 2018-10-21 DIAGNOSIS — I4581 Long QT syndrome: Secondary | ICD-10-CM | POA: Diagnosis present

## 2018-10-21 DIAGNOSIS — J9621 Acute and chronic respiratory failure with hypoxia: Secondary | ICD-10-CM | POA: Diagnosis present

## 2018-10-21 DIAGNOSIS — S0990XA Unspecified injury of head, initial encounter: Secondary | ICD-10-CM | POA: Diagnosis not present

## 2018-10-21 DIAGNOSIS — E872 Acidosis: Secondary | ICD-10-CM | POA: Diagnosis present

## 2018-10-21 DIAGNOSIS — Z6832 Body mass index (BMI) 32.0-32.9, adult: Secondary | ICD-10-CM | POA: Diagnosis not present

## 2018-10-21 DIAGNOSIS — R0689 Other abnormalities of breathing: Secondary | ICD-10-CM | POA: Diagnosis not present

## 2018-10-21 DIAGNOSIS — F209 Schizophrenia, unspecified: Secondary | ICD-10-CM | POA: Diagnosis present

## 2018-10-21 DIAGNOSIS — I10 Essential (primary) hypertension: Secondary | ICD-10-CM | POA: Diagnosis not present

## 2018-10-21 DIAGNOSIS — J9600 Acute respiratory failure, unspecified whether with hypoxia or hypercapnia: Secondary | ICD-10-CM | POA: Diagnosis not present

## 2018-10-21 DIAGNOSIS — Z9981 Dependence on supplemental oxygen: Secondary | ICD-10-CM

## 2018-10-21 DIAGNOSIS — E785 Hyperlipidemia, unspecified: Secondary | ICD-10-CM | POA: Diagnosis not present

## 2018-10-21 DIAGNOSIS — E7800 Pure hypercholesterolemia, unspecified: Secondary | ICD-10-CM | POA: Diagnosis not present

## 2018-10-21 DIAGNOSIS — J9622 Acute and chronic respiratory failure with hypercapnia: Secondary | ICD-10-CM | POA: Diagnosis present

## 2018-10-21 DIAGNOSIS — I499 Cardiac arrhythmia, unspecified: Secondary | ICD-10-CM | POA: Diagnosis not present

## 2018-10-21 DIAGNOSIS — J9690 Respiratory failure, unspecified, unspecified whether with hypoxia or hypercapnia: Secondary | ICD-10-CM

## 2018-10-21 DIAGNOSIS — R2689 Other abnormalities of gait and mobility: Secondary | ICD-10-CM | POA: Diagnosis not present

## 2018-10-21 DIAGNOSIS — J441 Chronic obstructive pulmonary disease with (acute) exacerbation: Secondary | ICD-10-CM | POA: Diagnosis present

## 2018-10-21 DIAGNOSIS — Z87891 Personal history of nicotine dependence: Secondary | ICD-10-CM | POA: Diagnosis not present

## 2018-10-21 DIAGNOSIS — I4891 Unspecified atrial fibrillation: Secondary | ICD-10-CM | POA: Diagnosis not present

## 2018-10-21 DIAGNOSIS — Z743 Need for continuous supervision: Secondary | ICD-10-CM | POA: Diagnosis not present

## 2018-10-21 DIAGNOSIS — I483 Typical atrial flutter: Secondary | ICD-10-CM | POA: Diagnosis not present

## 2018-10-21 DIAGNOSIS — R279 Unspecified lack of coordination: Secondary | ICD-10-CM | POA: Diagnosis not present

## 2018-10-21 DIAGNOSIS — F10129 Alcohol abuse with intoxication, unspecified: Secondary | ICD-10-CM | POA: Diagnosis not present

## 2018-10-21 DIAGNOSIS — F1010 Alcohol abuse, uncomplicated: Secondary | ICD-10-CM | POA: Diagnosis present

## 2018-10-21 DIAGNOSIS — R0789 Other chest pain: Secondary | ICD-10-CM | POA: Diagnosis not present

## 2018-10-21 DIAGNOSIS — S0180XA Unspecified open wound of other part of head, initial encounter: Secondary | ICD-10-CM | POA: Diagnosis present

## 2018-10-21 DIAGNOSIS — J9601 Acute respiratory failure with hypoxia: Secondary | ICD-10-CM | POA: Diagnosis not present

## 2018-10-21 HISTORY — DX: Acute kidney failure, unspecified: N17.9

## 2018-10-21 HISTORY — DX: Anemia, unspecified: D64.9

## 2018-10-21 HISTORY — DX: Pneumonitis due to inhalation of food and vomit: J69.0

## 2018-10-21 LAB — POCT I-STAT EG7
Acid-Base Excess: 14 mmol/L — ABNORMAL HIGH (ref 0.0–2.0)
Acid-Base Excess: 13 mmol/L — ABNORMAL HIGH (ref 0.0–2.0)
Bicarbonate: 44.3 mmol/L — ABNORMAL HIGH (ref 20.0–28.0)
Bicarbonate: 40.6 mmol/L — ABNORMAL HIGH (ref 20.0–28.0)
CALCIUM ION: 1.27 mmol/L (ref 1.15–1.40)
CALCIUM ION: 1.23 mmol/L (ref 1.15–1.40)
HCT: 38 % — ABNORMAL LOW (ref 39.0–52.0)
HEMATOCRIT: 35 % — AB (ref 39.0–52.0)
Hemoglobin: 12.9 g/dL — ABNORMAL LOW (ref 13.0–17.0)
Hemoglobin: 11.9 g/dL — ABNORMAL LOW (ref 13.0–17.0)
O2 Saturation: 42 %
O2 Saturation: 53 %
Potassium: 4.6 mmol/L (ref 3.5–5.1)
Potassium: 4.3 mmol/L (ref 3.5–5.1)
SODIUM: 132 mmol/L — AB (ref 135–145)
Sodium: 132 mmol/L — ABNORMAL LOW (ref 135–145)
TCO2: 47 mmol/L — ABNORMAL HIGH (ref 22–32)
TCO2: 42 mmol/L — ABNORMAL HIGH (ref 22–32)
pCO2, Ven: 88.9 mmHg (ref 44.0–60.0)
pCO2, Ven: 63.3 mmHg — ABNORMAL HIGH (ref 44.0–60.0)
pH, Ven: 7.305 (ref 7.250–7.430)
pH, Ven: 7.415 (ref 7.250–7.430)
pO2, Ven: 28 mmHg — CL (ref 32.0–45.0)
pO2, Ven: 29 mmHg — CL (ref 32.0–45.0)

## 2018-10-21 LAB — POCT I-STAT 7, (LYTES, BLD GAS, ICA,H+H)
Acid-Base Excess: 14 mmol/L — ABNORMAL HIGH (ref 0.0–2.0)
Bicarbonate: 42.3 mmol/L — ABNORMAL HIGH (ref 20.0–28.0)
CALCIUM ION: 1.27 mmol/L (ref 1.15–1.40)
HCT: 38 % — ABNORMAL LOW (ref 39.0–52.0)
Hemoglobin: 12.9 g/dL — ABNORMAL LOW (ref 13.0–17.0)
O2 Saturation: 97 %
PH ART: 7.369 (ref 7.350–7.450)
Patient temperature: 98.6
Potassium: 4.7 mmol/L (ref 3.5–5.1)
Sodium: 133 mmol/L — ABNORMAL LOW (ref 135–145)
TCO2: 44 mmol/L — ABNORMAL HIGH (ref 22–32)
pCO2 arterial: 73.3 mmHg (ref 32.0–48.0)
pO2, Arterial: 101 mmHg (ref 83.0–108.0)

## 2018-10-21 LAB — BRAIN NATRIURETIC PEPTIDE: B Natriuretic Peptide: 184.7 pg/mL — ABNORMAL HIGH (ref 0.0–100.0)

## 2018-10-21 LAB — URINALYSIS, ROUTINE W REFLEX MICROSCOPIC
Bilirubin Urine: NEGATIVE
Glucose, UA: NEGATIVE mg/dL
Hgb urine dipstick: NEGATIVE
Ketones, ur: 5 mg/dL — AB
Leukocytes, UA: NEGATIVE
Nitrite: NEGATIVE
Protein, ur: NEGATIVE mg/dL
Specific Gravity, Urine: 1.009 (ref 1.005–1.030)
pH: 8 (ref 5.0–8.0)

## 2018-10-21 LAB — CBC
HCT: 38.8 % — ABNORMAL LOW (ref 39.0–52.0)
Hemoglobin: 13.1 g/dL (ref 13.0–17.0)
MCH: 34.8 pg — AB (ref 26.0–34.0)
MCHC: 33.8 g/dL (ref 30.0–36.0)
MCV: 103.2 fL — ABNORMAL HIGH (ref 80.0–100.0)
PLATELETS: 193 10*3/uL (ref 150–400)
RBC: 3.76 MIL/uL — ABNORMAL LOW (ref 4.22–5.81)
RDW: 12.8 % (ref 11.5–15.5)
WBC: 4.9 10*3/uL (ref 4.0–10.5)
nRBC: 0 % (ref 0.0–0.2)

## 2018-10-21 LAB — CK: Total CK: 41 U/L — ABNORMAL LOW (ref 49–397)

## 2018-10-21 LAB — BASIC METABOLIC PANEL
Anion gap: 10 (ref 5–15)
BUN: 10 mg/dL (ref 8–23)
CALCIUM: 9.3 mg/dL (ref 8.9–10.3)
CO2: 36 mmol/L — ABNORMAL HIGH (ref 22–32)
Chloride: 88 mmol/L — ABNORMAL LOW (ref 98–111)
Creatinine, Ser: 0.86 mg/dL (ref 0.61–1.24)
GFR calc Af Amer: >60 mL/min (ref >60)
GFR calc non Af Amer: >60 mL/min (ref >60)
Glucose, Bld: 120 mg/dL — ABNORMAL HIGH (ref 70–99)
Potassium: 4.6 mmol/L (ref 3.5–5.1)
Sodium: 134 mmol/L — ABNORMAL LOW (ref 135–145)

## 2018-10-21 LAB — I-STAT TROPONIN, ED: Troponin i, poc: 0 ng/mL (ref 0.00–0.08)

## 2018-10-21 LAB — LACTIC ACID, PLASMA: Lactic Acid, Venous: 1.6 mmol/L (ref 0.5–1.9)

## 2018-10-21 LAB — ETHANOL: Alcohol, Ethyl (B): <10 mg/dL (ref <10)

## 2018-10-21 MED ORDER — IPRATROPIUM-ALBUTEROL 0.5-2.5 (3) MG/3ML IN SOLN
3.0000 mL | Freq: Once | RESPIRATORY_TRACT | Status: DC
  Filled 2018-10-21: qty 3

## 2018-10-21 MED ORDER — IPRATROPIUM-ALBUTEROL 0.5-2.5 (3) MG/3ML IN SOLN
3.0000 mL | Freq: Once | RESPIRATORY_TRACT | Status: AC
  Administered 2018-10-21: 3 mL via RESPIRATORY_TRACT
  Filled 2018-10-21: qty 3

## 2018-10-21 MED ORDER — METHYLPREDNISOLONE SODIUM SUCC 125 MG IJ SOLR
125.0000 mg | Freq: Once | INTRAMUSCULAR | Status: AC
  Administered 2018-10-21: 125 mg via INTRAVENOUS
  Filled 2018-10-21: qty 2

## 2018-10-21 MED ORDER — LIDOCAINE HCL URETHRAL/MUCOSAL 2 % EX GEL
1.0000 "application " | Freq: Once | CUTANEOUS | Status: DC
  Filled 2018-10-21: qty 20

## 2018-10-21 MED ORDER — HYDROMORPHONE HCL 1 MG/ML IJ SOLN
0.5000 mg | Freq: Once | INTRAMUSCULAR | Status: DC
  Filled 2018-10-21: qty 1

## 2018-10-21 NOTE — ED Notes (Signed)
Thee pt reports that  He has

## 2018-10-21 NOTE — ED Notes (Signed)
Critical care at the bedside. 

## 2018-10-21 NOTE — ED Provider Notes (Signed)
[facility] Emergency Department Provider Note MRN:  [id number]  Arrival date & time: 10/22/18     Chief Complaint   Shortness of Breath   History of Present Illness   Albert Hess is a 73 y.o. year-old male with a history of bipolar disorder, COPD, diabetes, alcohol abuse presenting to the ED with chief complaint of shortness of breath.  4 days of progressively worsening shortness of breath.  Denies chest pain.  Patient states that he passed out and fell 4 days ago as well, explains that he passed out because of heavy drinking.  Denies alcohol use today, no headache, no vomiting, no abdominal pain, no numbness or weakness to the arms or legs.  Symptoms are constant, progressively worsening with time, no exacerbating relieving factors.  Review of Systems  A complete 10 system review of systems was obtained and all systems are negative except as noted in the HPI and PMH.   Patient's Health History    Past Medical History:  Diagnosis Date  . AKI (acute kidney injury) (HCC) 03/16/2013  . Alcohol abuse   . Anemia 03/16/2013  . Aspiration pneumonia (HCC) 10/11/2018  . Atrial flutter (HCC)   . Bipolar 2 disorder (HCC)   . COPD (chronic obstructive pulmonary disease) (HCC)   . Diabetes mellitus without complication (HCC)   . High cholesterol   . Hypertension   . Obesity   . PTSD (post-traumatic stress disorder)   . Schizophrenia [facility])     Past Surgical History:  Procedure Laterality Date  . right side     pt points to R side for surgery    Family History  Problem Relation Age of Onset  . Suicidality Father     Social History   Socioeconomic History  . Marital status: Single    Spouse name: Not on file  . Number of children: Not on file  . Years of education: Not on file  . Highest education level: Not on file  Occupational History  . Not on file  Social Needs  . Financial resource strain: Not on file  . Food insecurity:    Worry: Not on file   Inability: Not on file  . Transportation needs:    Medical: Not on file    Non-medical: Not on file  Tobacco Use  . Smoking status: Former Smoker    Packs/day: 0.00    Types: Cigarettes  . Smokeless tobacco: Never Used  Substance and Sexual Activity  . Alcohol use: Yes    Comment: 6 pkg weekly  . Drug use: Yes    Types: Marijuana    Comment: "I take a hit in the morning to get me going"  . Sexual activity: Never    Birth control/protection: None  Lifestyle  . Physical activity:    Days per week: Not on file    Minutes per session: Not on file  . Stress: Not on file  Relationships  . Social connections:    Talks on phone: Not on file    Gets together: Not on file    Attends religious service: Not on file    Active member of club or organization: Not on file    Attends meetings of clubs or organizations: Not on file    Relationship status: Not on file  . Intimate partner violence:    Fear of current or ex partner: Not on file    Emotionally abused: Not on file    Physically abused: Not on file  Forced sexual activity: Not on file  Other Topics Concern  . Not on file  Social History Narrative  . Not on file     Physical Exam  Vital Signs and Nursing Notes reviewed Vitals:   10/21/18 2330 10/22/18 0000  BP: (!) 160/115 (!) 153/109  Pulse: 74 74  Resp: (!) 21 (!) 27  Temp:    SpO2: 100% 100%    CONSTITUTIONAL: Chronically ill-appearing, NAD NEURO:  Alert and oriented x 3, no focal deficits EYES:  eyes equal and reactive ENT/NECK:  no LAD, no JVD CARDIO: Tachycardic rate, well-perfused, normal S1 and S2 PULM: Faint wheezes, tachypneic GI/GU:  normal bowel sounds, non-distended, non-tender MSK/SPINE:  No gross deformities, no edema SKIN:  no rash, healing laceration to the nose PSYCH:  Appropriate speech and behavior  Diagnostic and Interventional Summary    EKG Interpretation  Date/Time:  Sunday October 21 2018 18:34:37 EST Ventricular Rate:  95 PR  Interval:    QRS Duration: 75 QT Interval:  382 QTC Calculation: 483 R Axis:   100 Text Interpretation:  Atrial flutter Right axis deviation Nonspecific T abnrm, anterolateral leads Borderline prolonged QT interval Baseline wander in lead(s) V1 Confirmed by Kennis Carina [phone]) on 10/21/2018 6:44:08 PM      Labs Reviewed  CBC - Abnormal; Notable for the following components:      Result Value   RBC 3.76 (*)    HCT 38.8 (*)    MCV 103.2 (*)    MCH 34.8 (*)    All other components within normal limits  BASIC METABOLIC PANEL - Abnormal; Notable for the following components:   Sodium 134 (*)    Chloride 88 (*)    CO2 36 (*)    Glucose, Bld 120 (*)    All other components within normal limits  BRAIN NATRIURETIC PEPTIDE - Abnormal; Notable for the following components:   B Natriuretic Peptide 184.7 (*)    All other components within normal limits  URINALYSIS, ROUTINE W REFLEX MICROSCOPIC - Abnormal; Notable for the following components:   Ketones, ur 5 (*)    All other components within normal limits  CK - Abnormal; Notable for the following components:   Total CK 41 (*)    All other components within normal limits  POCT I-STAT EG7 - Abnormal; Notable for the following components:   pCO2, Ven 88.9 (*)    pO2, Ven 28.0 (*)    Bicarbonate 44.3 (*)    TCO2 47 (*)    Acid-Base Excess 14.0 (*)    Sodium 132 (*)    HCT 38.0 (*)    Hemoglobin 12.9 (*)    All other components within normal limits  POCT I-STAT EG7 - Abnormal; Notable for the following components:   pCO2, Ven 63.3 (*)    pO2, Ven 29.0 (*)    Bicarbonate 40.6 (*)    TCO2 42 (*)    Acid-Base Excess 13.0 (*)    Sodium 132 (*)    HCT 35.0 (*)    Hemoglobin 11.9 (*)    All other components within normal limits  POCT I-STAT 7, (LYTES, BLD GAS, ICA,H+H) - Abnormal; Notable for the following components:   pCO2 arterial 73.3 (*)    Bicarbonate 42.3 (*)    TCO2 44 (*)    Acid-Base Excess 14.0 (*)    Sodium 133 (*)     HCT 38.0 (*)    Hemoglobin 12.9 (*)    All other components within normal limits  EXPECTORATED SPUTUM ASSESSMENT W REFEX TO RESP CULTURE  LACTIC ACID, PLASMA  ETHANOL  TROPONIN I  TROPONIN I  TROPONIN I  CBC  CREATININE, SERUM  BLOOD GAS, ARTERIAL  BASIC METABOLIC PANEL  CBC WITH DIFFERENTIAL/PLATELET  I-STAT TROPONIN, ED  CYTOLOGY - NON PAP    CT HEAD WO CONTRAST  Final Result    DG Chest Port 1 View  Final Result    CT ANGIO CHEST PE W OR WO CONTRAST    (Results Pending)    Medications  ipratropium-albuterol (DUONEB) 0.5-2.5 (3) MG/3ML nebulizer solution 3 mL (3 mLs Nebulization Not Given 10/21/18 2010)  methylPREDNISolone sodium succinate (SOLU-MEDROL) 125 mg/2 mL injection 125 mg (has no administration in time range)  lidocaine (XYLOCAINE) 2 % jelly 1 application (1 application Topical Not Given 10/21/18 2309)  diltiazem (CARDIZEM) 1 mg/mL load via infusion 15 mg (has no administration in time range)  diltiazem (CARDIZEM) 100 mg in dextrose 5% (1 mg/mL) infusion (has no administration in time range)  metoprolol tartrate (LOPRESSOR) tablet 100 mg (has no administration in time range)  insulin aspart (novoLOG) injection 0-9 Units (has no administration in time range)  enoxaparin (LOVENOX) injection 40 mg (has no administration in time range)  sodium chloride flush (NS) 0.9 % injection 3 mL (has no administration in time range)  sodium chloride flush (NS) 0.9 % injection 3 mL (has no administration in time range)  0.9 %  sodium chloride infusion (has no administration in time range)  ipratropium-albuterol (DUONEB) 0.5-2.5 (3) MG/3ML nebulizer solution 3 mL (has no administration in time range)  albuterol (PROVENTIL) (2.5 MG/3ML) 0.083% nebulizer solution 2.5 mg (has no administration in time range)  methylPREDNISolone sodium succinate (SOLU-MEDROL) 125 mg/2 mL injection 80 mg (has no administration in time range)  iopamidol (ISOVUE-370) 76 % injection (has no administration  in time range)  ipratropium-albuterol (DUONEB) 0.5-2.5 (3) MG/3ML nebulizer solution 3 mL (3 mLs Nebulization Given 10/21/18 2003)     Procedures  Emergency Ultrasound Study:   Angiocath insertion Performed by: Sabas Sous  Consent: Verbal consent obtained. Risks and benefits: risks, benefits and alternatives were discussed Immediately prior to procedure the correct patient, procedure, equipment, support staff and site/side marked as needed.  Indication: difficult IV access Preparation: Patient was prepped and draped in the usual sterile fashion. Vein Location: The right basilic vein was visualized during assessment for potential access sites and was found to be patent/ easily compressed with linear ultrasound.  The needle was visualized with real-time ultrasound and guided into the vein. Gauge: 20  Images were not saved due to the time sensitive nature of the situation. Normal blood return.  Patient tolerance: Patient tolerated the procedure well with no immediate complications.  Critical Care Critical Care Documentation Critical care time provided by me (excluding procedures): 42 minutes  Condition necessitating critical care: Hypercarbic respiratory failure  Components of critical care management: reviewing of prior records, laboratory and imaging interpretation, frequent re-examination and reassessment of vital signs, administration of IV Solu-Medrol, multiple duo nebs, noninvasive positive pressure ventilation, discussion with consulting services  ED Course and Medical Decision Making  I have reviewed the triage vital signs and the nursing notes.  Pertinent labs & imaging results that were available during my care of the patient were reviewed by me and considered in my medical decision making (see below for details).  COPD versus CHF exacerbation, also considering metabolic disarray, rhabdo given that he fell and is unsure if he was on the ground for  a long time.  Patient  lives alone, generally poor health, alcoholic, history of aspiration pneumonia, work-up pending.  Clinical Course as of Oct 22 48  Sun Oct 21, 2018  1954 Significant hypercarbia noted, patient remains awake, alert, protecting airway.  Respiratory therapy called, on the way to provide BiPAP.   [MB]  2042 Patient explains that the BiPAP mask is hurting his nose, which was previously injured.  Requests that it be taken off.  Explained to patient thoroughly that we are very concerned about his labs, concerned that if we take him off the BiPAP he will get worse, fall asleep, stop breathing, possibly die.  Patient is aware of these risks and still decides to have the mask removed.  He is alert and oriented x4, with full capacity to make this decision.   [MB]  2042 I have asked patient if I can talk to his daughter about his recent decisions, he agrees to let me do this.   [MB]  2120 Discussed with daughter, who confirms that this is his decision.  Patient agrees with plan to apply lidocaine to the nose, provide Dilaudid for pain, try the BiPAP mask again.  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   [MB]    Clinical Course User Index [MB] Sabas SousBero, Michael M, MD    Patient became altered, suspecting worsening hypercarbia, placed on BiPAP, again intensivist consulted for admission.  Patient then improved on the BiPAP, CO2 decreased on repeat gas.  Intensivists request admission to stepdown.  Admitted to hospitalist service for further care.  Elmer SowMichael M. Pilar PlateBero, MD [facility] [email or web address]  Final Clinical Impressions(s) / ED Diagnoses     ICD-10-CM   1. Acute respiratory failure with hypercapnia (HCC) J96.02   2. SOB (shortness of breath) R06.02 [facility]    [facility]    ED Discharge Orders    None           Sabas SousBero, Michael M, MD 10/22/18 [phone]

## 2018-10-21 NOTE — Progress Notes (Signed)
Patient requested to be taken off of BIPAP. RT and MD explained to patient the importance of wearing BIPAP at this time. Patient continued to refuse BIPAP. RT took patient off and placed on 2L [place]. MD is aware. RT will monitor patient as needed.

## 2018-10-21 NOTE — ED Notes (Signed)
Portable chest done pt going to c-t

## 2018-10-21 NOTE — Consult Note (Signed)
NAME:  Albert GoldsmithMark M Buchberger, MRN:  [id number], DOB:  1946-05-05, LOS: 0 ADMISSION DATE:  10/21/2018, CONSULTATION DATE:  2/2 REFERRING MD:  Dr Pilar PlateBero EDP, CHIEF COMPLAINT:  SOB   Brief History   73 year old male with recent admission for PNA and AFRVR. He has a history of alcohol abuse. Now presenting with worsening dyspnea and and AMS requiring BiPAP.  History of present illness   73 year old male with PMH as below, which is significant for alcohol abuse, AF RVR (not on anticoagulation), bipolar disorder, COPD, and HTN. He was recently admitted for new onset AF and pneumonia 1/23 > 1/31. At that time he had fallen and presented with multiple facial bruises and a nasal bone fracture. Falls secondary to alcohol use. At that point he was an everyday drinker of vodka. Currently reports he has not had a drink for one week. AF was treated with diltiazem and no anticoagulation due to falls. Pneumonia treated with antibiotics. 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 He was discharged on 3L O2.  Then on 2/2 he [facility] with complaints of SOB and AMS. At that point, he was a poor historian and little HPI was available. Despite supplemental O2 and treatment for COPD exacerbation his mental status worsened and VBG was concerning for respiratory acidosis. He was started on BiPAP and his mental status seemed to improve. CXR unremarkable with the exception of a cavitary lesion, which is known and was unchanged. PCCM was asked to evaluate.   Past Medical History   has a past medical history of Alcohol abuse, Atrial flutter (HCC), Bipolar 2 disorder (HCC), COPD (chronic obstructive pulmonary disease) (HCC), Diabetes mellitus without complication (HCC), High cholesterol, Hypertension, Obesity, PTSD (post-traumatic stress disorder), and Schizophrenia (HCC).   Significant Hospital Events   1/23 > 1/31 admit for AF and PNA 2/2 admit for SOB and AMS.    Consults:    Procedures:    Significant Diagnostic Tests:  Sputum cytology 2/2 >  Micro Data:  Sputum 2/2 >   Antimicrobials:    Interim history/subjective:    Objective   Blood pressure 132/81, pulse 78, temperature 98.1 F (36.7 C), resp. rate (!) 33, height 6\' 1"  (1.854 m), weight 110.7 kg, SpO2 100 %.    FiO2 (%):  [35 %] 35 %  No intake or output data in the 24 hours ending 10/21/18 2315 Filed Weights   10/21/18 1838  Weight: 110.7 kg    Examination: General: Elderly male in NAD HENT: bruising over the right cheek bone.  Lungs: Rales, diminished bases. No distress on BiPAP Cardiovascular: IRIR no MRG Abdomen: Soft, non-tender, non-disteded Extremities: No acute deformity or ROM limitation. Trace edema.  Neuro: Alert, oriented, non-focal. Does seem inconsistent with recent timeline.   Resolved Hospital Problem list     Assessment & Plan:   Respiratory distress: etiology not entirely clear. He is not able to give a good history of the events leading to his presentation. CXR is non-acte and is unchanged from prior. This would point towards COPD exacerbation. ABG after about an hour of BiPAP shows a barely compensated chronic respiratory acidosis, which would also lead towards this diagnosis. He does not appear to be volume up on exam, with recent echo showing strong EF. CHF considered less likely.  Plan: - BiPAP PRN, may end up needing some sort on long term nocturnal support.  - If he cannot tolerate BiPAP, he  may require intubation. He has been tolerating well.  - IV steroids with rapid taper.  - Scheduled duoneb.  - Strict I&O  Atrial fibrillation with RVR - per primary  Cavitary Lesion of the Right lung - felt to be abscess from 6 years prior with cavity that never healed.  - Pulmonary follow up as scheduled.   Alcohol abuse:  He reports no drinks for the past week - Monitor for s/s withdrawal  DM2 - Per primary   Best practice:  Diet: NPO  while on BiPAP Pain/Anxiety/Delirium protocol (if indicated): na VAP protocol (if indicated): na DVT prophylaxis: per primary GI prophylaxis: per primary Glucose control: per primary Mobility: BR Code Status: Did not address goals of care.  Family Communication: no family available Disposition: SDU under [facility]  Labs   CBC: Recent Labs  Lab 10/16/18 0553 10/17/18 0649 10/21/18 1940 10/21/18 1945 10/21/18 2223  WBC  --   --  4.9  --   --   HGB 12.8* 12.4* 13.1 12.9* 11.9*  HCT 37.0* 36.1* 38.8* 38.0* 35.0*  MCV  --   --  103.2*  --   --   PLT  --   --  193  --   --     Basic Metabolic Panel: Recent Labs  Lab 10/15/18 0335  10/16/18 0553 10/17/18 0649 10/18/18 0406 10/21/18 1940 10/21/18 1945 10/21/18 2223  NA  --    < > 133* 135 135 134* 132* 132*  K  --    < > 4.7 4.9 4.9 4.6 4.6 4.3  CL  --   --  96* 97* 95* 88*  --   --   CO2  --   --  27 31 32 36*  --   --   GLUCOSE  --   --  105* 113* 108* 120*  --   --   BUN  --   --  11 10 9 10   --   --   CREATININE  --   --  0.78 0.86 0.85 0.86  --   --   CALCIUM  --   --  8.3* 8.6* 8.6* 9.3  --   --   MG 1.5*  --  1.5* 1.8  --   --   --   --    < > = values in this interval not displayed.   GFR: Estimated Creatinine Clearance: 102.7 mL/min (by C-G formula based on SCr of 0.86 mg/dL). Recent Labs  Lab 10/21/18 1940  WBC 4.9  LATICACIDVEN 1.6    Liver Function Tests: No results for input(s): AST, ALT, ALKPHOS, BILITOT, PROT, ALBUMIN in the last 168 hours. No results for input(s): LIPASE, AMYLASE in the last 168 hours. No results for input(s): AMMONIA in the last 168 hours.  ABG    Component Value Date/Time   HCO3 40.6 (H) 10/21/2018 2223   TCO2 42 (H) 10/21/2018 2223   O2SAT 53.0 10/21/2018 2223     Coagulation Profile: No results for input(s): INR, PROTIME in the last 168 hours.  Cardiac Enzymes: Recent Labs  Lab 10/21/18 1940  CKTOTAL 41*    HbA1C: Hgb A1c MFr Bld  Date/Time Value Ref Range Status   12/24/2012 01:36 PM 5.7 (H) <5.7 % Final    Comment:    (NOTE)  According to the ADA Clinical Practice Recommendations for 2011, when HbA1c is used as a screening test:  >=6.5%   Diagnostic of Diabetes Mellitus           (if abnormal result is confirmed) 5.7-6.4%   Increased risk of developing Diabetes Mellitus References:Diagnosis and Classification of Diabetes Mellitus,Diabetes Care,2011,34(Suppl 1):S62-S69 and Standards of Medical Care in         Diabetes - 2011,Diabetes Care,2011,34 (Suppl 1):S11-S61.    CBG: Recent Labs  Lab 10/18/18 1632 10/18/18 2118 10/19/18 0738 10/19/18 1132 10/19/18 1651  GLUCAP 94 101* 101* 120* 109*    Review of Systems:   Bolds are positive > limited by BiPAP and AMS Constitutional: weight loss, gain, night sweats, Fevers, chills, fatigue .  HEENT: headaches, Sore throat, sneezing, nasal congestion, post nasal drip, Difficulty swallowing, Tooth/dental problems, visual complaints visual changes, ear ache CV:  chest pain, radiates:,Orthopnea, PND, swelling in lower extremities, dizziness, palpitations, syncope.  GI  heartburn, indigestion, abdominal pain, nausea, vomiting, diarrhea, change in bowel habits, loss of appetite, bloody stools.  Resp: cough, productive: , hemoptysis, dyspnea, chest pain, pleuritic.  Skin: rash or itching or icterus GU: dysuria, change in color of urine, urgency or frequency. flank pain, hematuria  MS: joint pain or swelling. decreased range of motion  Psych: change in mood or affect. depression or anxiety.  Neuro: difficulty with speech, weakness, numbness, ataxia    Past Medical History  He,  has a past medical history of Alcohol abuse, Atrial flutter (HCC), Bipolar 2 disorder (HCC), COPD (chronic obstructive pulmonary disease) (HCC), Diabetes mellitus without complication (HCC), High cholesterol, Hypertension, Obesity, PTSD (post-traumatic stress  disorder), and Schizophrenia (HCC).   Surgical History    Past Surgical History:  Procedure Laterality Date  . right side     pt points to R side for surgery     Social History   reports that he has quit smoking. His smoking use included cigarettes. He smoked 0.00 packs per day. He has never used smokeless tobacco. He reports current alcohol use. He reports current drug use. Drug: Marijuana.   Family History   His family history includes Suicidality in his father.   Allergies No Known Allergies   Home Medications  Prior to Admission medications   Medication Sig Start Date End Date Taking? Authorizing Provider  acetaminophen (TYLENOL) 325 MG tablet Take 2 tablets (650 mg total) by mouth every 6 (six) hours as needed for mild pain, fever or headache (or Fever >/= 101). 10/19/18  Yes Elgergawy, Leana Roe, MD  insulin aspart (NOVOLOG) 100 UNIT/ML injection Inject 0-9 Units into the skin 3 (three) times daily with meals. 10/19/18  Yes Elgergawy, Leana Roe, MD  Ipratropium-Albuterol (COMBIVENT RESPIMAT) 20-100 MCG/ACT AERS respimat Inhale 1 puff into the lungs every 6 (six) hours as needed for wheezing or shortness of breath. 10/18/16  Yes Hongalgi, Maximino Greenland, MD  metoprolol tartrate (LOPRESSOR) 100 MG tablet Take 1 tablet (100 mg total) by mouth 2 (two) times daily. 10/19/18  Yes Elgergawy, Leana Roe, MD  Multiple Vitamin (MULTIVITAMIN WITH MINERALS) TABS tablet Take 1 tablet by mouth daily. 10/19/16  Yes Hongalgi, Maximino Greenland, MD  pravastatin (PRAVACHOL) 40 MG tablet Take 1 tablet (40 mg total) by mouth daily. 10/18/16  Yes Hongalgi, Maximino Greenland, MD  thiamine 100 MG tablet Take 1 tablet (100 mg total) by mouth daily. 10/19/16  Yes Elease Etienne, MD     Critical care time:      Joneen Roach, AGACNP-BC  [facility] Pulmonary/Critical Care Pager [phone] or [phone]  10/22/2018 12:05 AM

## 2018-10-21 NOTE — Progress Notes (Signed)
RT placed patient on BIPAP with 2x2 gauze under mask on nose due to abrasion on nose. Patient is tolerating BIPAP well at this time. RT will monitor.

## 2018-10-22 ENCOUNTER — Other Ambulatory Visit: Payer: Self-pay

## 2018-10-22 ENCOUNTER — Inpatient Hospital Stay (HOSPITAL_COMMUNITY): Payer: Medicare Other

## 2018-10-22 ENCOUNTER — Encounter (HOSPITAL_COMMUNITY): Payer: Self-pay | Admitting: Pulmonary Disease

## 2018-10-22 DIAGNOSIS — I4892 Unspecified atrial flutter: Secondary | ICD-10-CM

## 2018-10-22 DIAGNOSIS — Z794 Long term (current) use of insulin: Secondary | ICD-10-CM | POA: Diagnosis not present

## 2018-10-22 DIAGNOSIS — J984 Other disorders of lung: Secondary | ICD-10-CM | POA: Diagnosis present

## 2018-10-22 DIAGNOSIS — J9621 Acute and chronic respiratory failure with hypoxia: Secondary | ICD-10-CM | POA: Diagnosis present

## 2018-10-22 DIAGNOSIS — J9601 Acute respiratory failure with hypoxia: Secondary | ICD-10-CM | POA: Diagnosis not present

## 2018-10-22 DIAGNOSIS — F209 Schizophrenia, unspecified: Secondary | ICD-10-CM | POA: Diagnosis present

## 2018-10-22 DIAGNOSIS — E669 Obesity, unspecified: Secondary | ICD-10-CM | POA: Diagnosis present

## 2018-10-22 DIAGNOSIS — R0602 Shortness of breath: Secondary | ICD-10-CM | POA: Diagnosis present

## 2018-10-22 DIAGNOSIS — Z6832 Body mass index (BMI) 32.0-32.9, adult: Secondary | ICD-10-CM | POA: Diagnosis not present

## 2018-10-22 DIAGNOSIS — J9602 Acute respiratory failure with hypercapnia: Secondary | ICD-10-CM | POA: Diagnosis not present

## 2018-10-22 DIAGNOSIS — E7800 Pure hypercholesterolemia, unspecified: Secondary | ICD-10-CM | POA: Diagnosis present

## 2018-10-22 DIAGNOSIS — J441 Chronic obstructive pulmonary disease with (acute) exacerbation: Principal | ICD-10-CM

## 2018-10-22 DIAGNOSIS — Z79899 Other long term (current) drug therapy: Secondary | ICD-10-CM | POA: Diagnosis not present

## 2018-10-22 DIAGNOSIS — Z9181 History of falling: Secondary | ICD-10-CM | POA: Diagnosis not present

## 2018-10-22 DIAGNOSIS — Z87891 Personal history of nicotine dependence: Secondary | ICD-10-CM | POA: Diagnosis not present

## 2018-10-22 DIAGNOSIS — F4310 Post-traumatic stress disorder, unspecified: Secondary | ICD-10-CM | POA: Diagnosis present

## 2018-10-22 DIAGNOSIS — F1020 Alcohol dependence, uncomplicated: Secondary | ICD-10-CM | POA: Diagnosis present

## 2018-10-22 DIAGNOSIS — I10 Essential (primary) hypertension: Secondary | ICD-10-CM

## 2018-10-22 DIAGNOSIS — I4891 Unspecified atrial fibrillation: Secondary | ICD-10-CM | POA: Diagnosis present

## 2018-10-22 DIAGNOSIS — E872 Acidosis: Secondary | ICD-10-CM | POA: Diagnosis present

## 2018-10-22 DIAGNOSIS — J9622 Acute and chronic respiratory failure with hypercapnia: Secondary | ICD-10-CM | POA: Diagnosis present

## 2018-10-22 DIAGNOSIS — Z818 Family history of other mental and behavioral disorders: Secondary | ICD-10-CM | POA: Diagnosis not present

## 2018-10-22 DIAGNOSIS — I4581 Long QT syndrome: Secondary | ICD-10-CM | POA: Diagnosis present

## 2018-10-22 DIAGNOSIS — Z9981 Dependence on supplemental oxygen: Secondary | ICD-10-CM | POA: Diagnosis not present

## 2018-10-22 DIAGNOSIS — E119 Type 2 diabetes mellitus without complications: Secondary | ICD-10-CM | POA: Diagnosis present

## 2018-10-22 LAB — CREATININE, SERUM
CREATININE: 0.84 mg/dL (ref 0.61–1.24)
GFR calc Af Amer: >60 mL/min (ref >60)
GFR calc non Af Amer: >60 mL/min (ref >60)

## 2018-10-22 LAB — CBC WITH DIFFERENTIAL/PLATELET
Abs Immature Granulocytes: 0.02 10*3/uL (ref 0.00–0.07)
Basophils Absolute: 0 10*3/uL (ref 0.0–0.1)
Basophils Relative: 1 %
Eosinophils Absolute: 0 10*3/uL (ref 0.0–0.5)
Eosinophils Relative: 0 %
HCT: 40.9 % (ref 39.0–52.0)
Hemoglobin: 13.5 g/dL (ref 13.0–17.0)
Immature Granulocytes: 1 %
Lymphocytes Relative: 11 %
Lymphs Abs: 0.5 10*3/uL — ABNORMAL LOW (ref 0.7–4.0)
MCH: 33.6 pg (ref 26.0–34.0)
MCHC: 33 g/dL (ref 30.0–36.0)
MCV: 101.7 fL — ABNORMAL HIGH (ref 80.0–100.0)
Monocytes Absolute: 0.2 10*3/uL (ref 0.1–1.0)
Monocytes Relative: 4 %
NRBC: 0 % (ref 0.0–0.2)
Neutro Abs: 3.4 10*3/uL (ref 1.7–7.7)
Neutrophils Relative %: 83 %
Platelets: 201 10*3/uL (ref 150–400)
RBC: 4.02 MIL/uL — ABNORMAL LOW (ref 4.22–5.81)
RDW: 12.4 % (ref 11.5–15.5)
WBC: 4.1 10*3/uL (ref 4.0–10.5)

## 2018-10-22 LAB — BASIC METABOLIC PANEL
Anion gap: 10 (ref 5–15)
BUN: 12 mg/dL (ref 8–23)
CO2: 34 mmol/L — ABNORMAL HIGH (ref 22–32)
Calcium: 9 mg/dL (ref 8.9–10.3)
Chloride: 94 mmol/L — ABNORMAL LOW (ref 98–111)
Creatinine, Ser: 0.9 mg/dL (ref 0.61–1.24)
GFR calc Af Amer: >60 mL/min (ref >60)
GFR calc non Af Amer: >60 mL/min (ref >60)
Glucose, Bld: 166 mg/dL — ABNORMAL HIGH (ref 70–99)
Potassium: 5 mmol/L (ref 3.5–5.1)
Sodium: 138 mmol/L (ref 135–145)

## 2018-10-22 LAB — CBC
HCT: 41 % (ref 39.0–52.0)
HEMOGLOBIN: 14 g/dL (ref 13.0–17.0)
MCH: 34.7 pg — AB (ref 26.0–34.0)
MCHC: 34.1 g/dL (ref 30.0–36.0)
MCV: 101.7 fL — ABNORMAL HIGH (ref 80.0–100.0)
Platelets: 171 10*3/uL (ref 150–400)
RBC: 4.03 MIL/uL — ABNORMAL LOW (ref 4.22–5.81)
RDW: 12.8 % (ref 11.5–15.5)
WBC: 4.7 10*3/uL (ref 4.0–10.5)
nRBC: 0 % (ref 0.0–0.2)

## 2018-10-22 LAB — POCT I-STAT EG7
Acid-Base Excess: 12 mmol/L — ABNORMAL HIGH (ref 0.0–2.0)
Bicarbonate: 41.2 mmol/L — ABNORMAL HIGH (ref 20.0–28.0)
CALCIUM ION: 1.28 mmol/L (ref 1.15–1.40)
HCT: 42 % (ref 39.0–52.0)
Hemoglobin: 14.3 g/dL (ref 13.0–17.0)
O2 Saturation: 98 %
PO2 VEN: 110 mmHg — AB (ref 32.0–45.0)
Potassium: 4.7 mmol/L (ref 3.5–5.1)
Sodium: 134 mmol/L — ABNORMAL LOW (ref 135–145)
TCO2: 43 mmol/L — ABNORMAL HIGH (ref 22–32)
pCO2, Ven: 77.7 mmHg (ref 44.0–60.0)
pH, Ven: 7.332 (ref 7.250–7.430)

## 2018-10-22 LAB — GLUCOSE, CAPILLARY
Glucose-Capillary: 129 mg/dL — ABNORMAL HIGH (ref 70–99)
Glucose-Capillary: 215 mg/dL — ABNORMAL HIGH (ref 70–99)
Glucose-Capillary: 121 mg/dL — ABNORMAL HIGH (ref 70–99)

## 2018-10-22 LAB — TROPONIN I
Troponin I: <0.03 ng/mL (ref <0.03)
Troponin I: <0.03 ng/mL (ref <0.03)
Troponin I: <0.03 ng/mL (ref <0.03)

## 2018-10-22 LAB — CBG MONITORING, ED: GLUCOSE-CAPILLARY: 170 mg/dL — AB (ref 70–99)

## 2018-10-22 LAB — MRSA PCR SCREENING: MRSA by PCR: POSITIVE — AB

## 2018-10-22 MED ORDER — SODIUM CHLORIDE 0.9% FLUSH: 3.0000 mL | INTRAVENOUS | Status: DC | PRN

## 2018-10-22 MED ORDER — DILTIAZEM HCL-DEXTROSE 100-5 MG/100ML-% IV SOLN (PREMIX)
5.0000 mg/h | INTRAVENOUS | Status: DC
  Administered 2018-10-22: 5 mg/h via INTRAVENOUS
  Filled 2018-10-22: qty 100

## 2018-10-22 MED ORDER — SODIUM CHLORIDE 0.9 % IV SOLN: 250.0000 mL | INTRAVENOUS | Status: DC | PRN

## 2018-10-22 MED ORDER — INSULIN ASPART 100 UNIT/ML ~~LOC~~ SOLN
0.0000 [IU] | Freq: Three times a day (TID) | SUBCUTANEOUS | Status: DC
  Administered 2018-10-22: 3 [IU] via SUBCUTANEOUS
  Administered 2018-10-22: 2 [IU] via SUBCUTANEOUS
  Administered 2018-10-22 – 2018-10-23 (×2): 1 [IU] via SUBCUTANEOUS
  Administered 2018-10-23: 2 [IU] via SUBCUTANEOUS
  Administered 2018-10-23: 1 [IU] via SUBCUTANEOUS

## 2018-10-22 MED ORDER — IOPAMIDOL (ISOVUE-370) INJECTION 76%
100.0000 mL | Freq: Once | INTRAVENOUS | Status: AC | PRN
  Administered 2018-10-22: 56 mL via INTRAVENOUS

## 2018-10-22 MED ORDER — ALBUTEROL SULFATE (2.5 MG/3ML) 0.083% IN NEBU: 2.5000 mg | INHALATION_SOLUTION | RESPIRATORY_TRACT | Status: DC | PRN

## 2018-10-22 MED ORDER — METHYLPREDNISOLONE SODIUM SUCC 125 MG IJ SOLR
60.0000 mg | Freq: Two times a day (BID) | INTRAMUSCULAR | Status: DC
  Administered 2018-10-22 – 2018-10-23 (×2): 60 mg via INTRAVENOUS
  Filled 2018-10-22 (×2): qty 2

## 2018-10-22 MED ORDER — ORAL CARE MOUTH RINSE
15.0000 mL | Freq: Two times a day (BID) | OROMUCOSAL | Status: DC
  Administered 2018-10-23 (×2): 15 mL via OROMUCOSAL

## 2018-10-22 MED ORDER — IPRATROPIUM-ALBUTEROL 0.5-2.5 (3) MG/3ML IN SOLN
3.0000 mL | Freq: Four times a day (QID) | RESPIRATORY_TRACT | Status: DC
  Administered 2018-10-22 – 2018-10-23 (×5): 3 mL via RESPIRATORY_TRACT
  Filled 2018-10-22 (×5): qty 3

## 2018-10-22 MED ORDER — MUPIROCIN 2 % EX OINT
1.0000 "application " | TOPICAL_OINTMENT | Freq: Two times a day (BID) | CUTANEOUS | Status: DC
  Administered 2018-10-22 – 2018-10-24 (×5): 1 via NASAL
  Filled 2018-10-22 (×2): qty 22

## 2018-10-22 MED ORDER — DILTIAZEM LOAD VIA INFUSION
15.0000 mg | Freq: Once | INTRAVENOUS | Status: AC
  Administered 2018-10-22: 15 mg via INTRAVENOUS
  Filled 2018-10-22: qty 15

## 2018-10-22 MED ORDER — IOPAMIDOL (ISOVUE-370) INJECTION 76%
INTRAVENOUS | Status: AC
  Filled 2018-10-22: qty 100

## 2018-10-22 MED ORDER — ENOXAPARIN SODIUM 40 MG/0.4ML ~~LOC~~ SOLN
40.0000 mg | SUBCUTANEOUS | Status: DC
  Administered 2018-10-22 – 2018-10-23 (×2): 40 mg via SUBCUTANEOUS
  Filled 2018-10-22 (×3): qty 0.4

## 2018-10-22 MED ORDER — CHLORHEXIDINE GLUCONATE CLOTH 2 % EX PADS
6.0000 | MEDICATED_PAD | Freq: Every day | CUTANEOUS | Status: DC
  Administered 2018-10-23 – 2018-10-24 (×2): 6 via TOPICAL

## 2018-10-22 MED ORDER — SODIUM CHLORIDE 0.9% FLUSH
3.0000 mL | Freq: Two times a day (BID) | INTRAVENOUS | Status: DC
  Administered 2018-10-22 – 2018-10-23 (×3): 3 mL via INTRAVENOUS

## 2018-10-22 MED ORDER — METOPROLOL TARTRATE 50 MG PO TABS
100.0000 mg | ORAL_TABLET | Freq: Two times a day (BID) | ORAL | Status: DC
  Administered 2018-10-22 – 2018-10-24 (×5): 100 mg via ORAL
  Filled 2018-10-22: qty 2
  Filled 2018-10-22: qty 4
  Filled 2018-10-22 (×3): qty 2

## 2018-10-22 MED ORDER — METHYLPREDNISOLONE SODIUM SUCC 125 MG IJ SOLR
80.0000 mg | Freq: Two times a day (BID) | INTRAMUSCULAR | Status: DC
  Administered 2018-10-22: 80 mg via INTRAVENOUS
  Filled 2018-10-22: qty 2

## 2018-10-22 NOTE — ED Notes (Signed)
Pt's CBG result was 170. Informed Holley - RN.

## 2018-10-22 NOTE — Progress Notes (Signed)
Vast RN called unit and spoke with unit RN regarding IV question. Unit RN stated that midline was active in chart, but was not present on patient. She questioned who is responsible for taking the line out as well as how to chart the line is not present. VAST RN educated that any RN can discontinue a midline as it is a long PIV; also walked her through process of charting line as discontinued for current date and time and charting not present on assessment. Unit RN verbalized understanding.

## 2018-10-22 NOTE — H&P (Signed)
History and Physical    Albert Hess ZOX:[id number] DOB: October 14, 1945 DOA: 10/21/2018  PCP: Wilfrid LundBecker, Anna G, PA  Patient coming from: Rehab  Chief Complaint: Shortness of breath  HPI: Albert GoldsmithMark M Hess is a 73 y.o. male with medical history significant of alcohol abuse, PTSD, schizophrenia, COPD, cavitary lesion just treated with a course of Unasyn for aspiration coverage last week discharged on October 19, 2018 to a rehab center.  Patient comes in because of progressive worsening shortness of breath.  Denies drinking as he is in rehab.  He denies smoking also.  He denies any fevers nausea vomiting or diarrhea.  He was found to be hypoxic with confusion and so therefore was sent to the emergency department for evaluation.  He has been on BiPAP for the last couple of hours and is much improved.  His ABG showed some hypercarbia PCCM was initially consulted as he was refusing BiPAP however he is turned around since then.  Patient be referred for admission for acute hypoxic and hypercapnic respiratory failure secondary to COPD exacerbation.  Patient discharged on 3 L of oxygen at the nursing home.  Review of Systems: Unobtainable secondary to BiPAP  Past Medical History:  Diagnosis Date  . Alcohol abuse   . Atrial flutter (HCC)   . Bipolar 2 disorder (HCC)   . COPD (chronic obstructive pulmonary disease) (HCC)   . Diabetes mellitus without complication (HCC)   . High cholesterol   . Hypertension   . Obesity   . PTSD (post-traumatic stress disorder)   . [facility](HCC)     Past Surgical History:  Procedure Laterality Date  . right side     pt points to R side for surgery     reports that he has quit smoking. His smoking use included cigarettes. He smoked 0.00 packs per day. He has never used smokeless tobacco. He reports current alcohol use. He reports current drug use. Drug: Marijuana.  No Known Allergies  Family History  Problem Relation Age of Onset  . Suicidality Father     Prior to  Admission medications   Medication Sig Start Date End Date Taking? Authorizing Provider  acetaminophen (TYLENOL) 325 MG tablet Take 2 tablets (650 mg total) by mouth every 6 (six) hours as needed for mild pain, fever or headache (or Fever >/= 101). 10/19/18  Yes Elgergawy, Leana Roeawood S, MD  insulin aspart (NOVOLOG) 100 UNIT/ML injection Inject 0-9 Units into the skin 3 (three) times daily with meals. 10/19/18  Yes Elgergawy, Leana Roeawood S, MD  Ipratropium-Albuterol (COMBIVENT RESPIMAT) 20-100 MCG/ACT AERS respimat Inhale 1 puff into the lungs every 6 (six) hours as needed for wheezing or shortness of breath. 10/18/16  Yes Hongalgi, Maximino GreenlandAnand D, MD  metoprolol tartrate (LOPRESSOR) 100 MG tablet Take 1 tablet (100 mg total) by mouth 2 (two) times daily. 10/19/18  Yes Elgergawy, Leana Roeawood S, MD  Multiple Vitamin (MULTIVITAMIN WITH MINERALS) TABS tablet Take 1 tablet by mouth daily. 10/19/16  Yes Hongalgi, Maximino GreenlandAnand D, MD  pravastatin (PRAVACHOL) 40 MG tablet Take 1 tablet (40 mg total) by mouth daily. 10/18/16  Yes Hongalgi, Maximino GreenlandAnand D, MD  thiamine 100 MG tablet Take 1 tablet (100 mg total) by mouth daily. 10/19/16  Yes Elease EtienneHongalgi, Anand D, MD    Physical Exam: Vitals:   10/21/18 2115 10/21/18 2145 10/21/18 2213 10/21/18 2327  BP: (!) 152/129 123/81 132/81 (!) 169/94  Pulse: (!) 104 75 78 74  Resp: (!) 24 (!) 30 (!) 33 (!) 32  Temp:  SpO2: (!) 84% 97% 100% 100%  Weight:      Height:         Constitutional: NAD, calm, comfortable Vitals:   10/21/18 2115 10/21/18 2145 10/21/18 2213 10/21/18 2327  BP: (!) 152/129 123/81 132/81 (!) 169/94  Pulse: (!) 104 75 78 74  Resp: (!) 24 (!) 30 (!) 33 (!) 32  Temp:      SpO2: (!) 84% 97% 100% 100%  Weight:      Height:       Eyes: PERRL, lids and conjunctivae normal ENMT: Mucous membranes are moist. Posterior pharynx clear of any exudate or lesions.Normal dentition.  Neck: normal, supple, no masses, no thyromegaly Respiratory: Diminished to auscultation bilaterally, no  wheezing, no crackles. Normal respiratory effort. No accessory muscle use.  Cardiovascular: Regular rate and rhythm, no murmurs / rubs / gallops. No extremity edema. 2+ pedal pulses. No carotid bruits.  Abdomen: no tenderness, no masses palpated. No hepatosplenomegaly. Bowel sounds positive.  Musculoskeletal: no clubbing / cyanosis. No joint deformity upper and lower extremities. Good ROM, no contractures. Normal muscle tone.  Skin: no rashes, lesions, ulcers. No induration Neurologic: CN 2-12 grossly intact. Sensation intact, DTR normal. Strength 5/5 in all 4.  Psychiatric: Normal judgment and insight. Alert and oriented x 3. Normal mood.    Labs on Admission: I have personally reviewed following labs and imaging studies  CBC: Recent Labs  Lab 10/17/18 0649 10/21/18 1940 10/21/18 1945 10/21/18 2223 10/21/18 2334  WBC  --  4.9  --   --   --   HGB 12.4* 13.1 12.9* 11.9* 12.9*  HCT 36.1* 38.8* 38.0* 35.0* 38.0*  MCV  --  103.2*  --   --   --   PLT  --  193  --   --   --    Basic Metabolic Panel: Recent Labs  Lab 10/15/18 0335  10/16/18 0553 10/17/18 0649 10/18/18 0406 10/21/18 1940 10/21/18 1945 10/21/18 2223 10/21/18 2334  NA  --    < > 133* 135 135 134* 132* 132* 133*  K  --    < > 4.7 4.9 4.9 4.6 4.6 4.3 4.7  CL  --   --  96* 97* 95* 88*  --   --   --   CO2  --   --  27 31 32 36*  --   --   --   GLUCOSE  --   --  105* 113* 108* 120*  --   --   --   BUN  --   --  11 10 9 10   --   --   --   CREATININE  --   --  0.78 0.86 0.85 0.86  --   --   --   CALCIUM  --   --  8.3* 8.6* 8.6* 9.3  --   --   --   MG 1.5*  --  1.5* 1.8  --   --   --   --   --    < > = values in this interval not displayed.   GFR: Estimated Creatinine Clearance: 102.7 mL/min (by C-G formula based on SCr of 0.86 mg/dL). Liver Function Tests: No results for input(s): AST, ALT, ALKPHOS, BILITOT, PROT, ALBUMIN in the last 168 hours. No results for input(s): LIPASE, AMYLASE in the last 168 hours. No  results for input(s): AMMONIA in the last 168 hours. Coagulation Profile: No results for input(s): INR, PROTIME in the last 168 hours.  Cardiac Enzymes: Recent Labs  Lab 10/21/18 1940  CKTOTAL 41*   BNP (last 3 results) No results for input(s): PROBNP in the last 8760 hours. HbA1C: No results for input(s): HGBA1C in the last 72 hours. CBG: Recent Labs  Lab 10/18/18 1632 10/18/18 2118 10/19/18 0738 10/19/18 1132 10/19/18 1651  GLUCAP 94 101* 101* 120* 109*   Lipid Profile: No results for input(s): CHOL, HDL, LDLCALC, TRIG, CHOLHDL, LDLDIRECT in the last 72 hours. Thyroid Function Tests: No results for input(s): TSH, T4TOTAL, FREET4, T3FREE, THYROIDAB in the last 72 hours. Anemia Panel: No results for input(s): VITAMINB12, FOLATE, FERRITIN, TIBC, IRON, RETICCTPCT in the last 72 hours. Urine analysis:    Component Value Date/Time   COLORURINE YELLOW 10/21/2018 2204   APPEARANCEUR CLEAR 10/21/2018 2204   LABSPEC 1.009 10/21/2018 2204   PHURINE 8.0 10/21/2018 2204   GLUCOSEU NEGATIVE 10/21/2018 2204   HGBUR NEGATIVE 10/21/2018 2204   BILIRUBINUR NEGATIVE 10/21/2018 2204   KETONESUR 5 (A) 10/21/2018 2204   PROTEINUR NEGATIVE 10/21/2018 2204   UROBILINOGEN 0.2 03/18/2013 0050   NITRITE NEGATIVE 10/21/2018 2204   LEUKOCYTESUR NEGATIVE 10/21/2018 2204   Sepsis Labs: !!!!!!!!!!!!!!!!!!!!!!!!!!!!!!!!!!!!!!!!!!!! @LABRCNTIP (procalcitonin:4,lacticidven:4) )No results found for this or any previous visit (from the past 240 hour(s)).   Radiological Exams on Admission: Ct Head Wo Contrast  Result Date: 10/21/2018 CLINICAL DATA:  73 year old male with acute head injury from fall today. Initial encounter. EXAM: CT HEAD WITHOUT CONTRAST TECHNIQUE: Contiguous axial images were obtained from the base of the skull through the vertex without intravenous contrast. COMPARISON:  10/11/2018 and prior CTs FINDINGS: Brain: No evidence of acute infarction, hemorrhage, hydrocephalus, extra-axial  collection or mass lesion/mass effect. Atrophy and chronic small-vessel white matter ischemic changes again noted. Vascular: Carotid and vertebral atherosclerotic calcifications noted. Skull: No acute abnormality. Remote nasal septum fracture again noted. Sinuses/Orbits: No acute abnormality Other: None IMPRESSION: 1. No evidence of acute intracranial abnormality 2. Atrophy and chronic small-vessel white matter ischemic changes. Electronically Signed   By: Harmon Pier M.D.   On: 10/21/2018 19:22   Dg Chest Port 1 View  Result Date: 10/21/2018 CLINICAL DATA:  Shortness of breath EXAM: PORTABLE CHEST 1 VIEW COMPARISON:  10/16/2018 FINDINGS: Shallow inspiration. Normal heart size and pulmonary vascularity. Thin walled cavitary lesion measuring 6.7 cm diameter in the right mid lung is unchanged since previous study. Left lung is clear. No focal consolidation or airspace disease. No blunting of costophrenic angles. No pneumothorax. IMPRESSION: 1. No active disease. 2. Thin walled cavitary lesion in the right mid lung without change. Electronically Signed   By: Burman Nieves M.D.   On: 10/21/2018 19:14    Old chart reviewed Case discussed with Dr. Pilar Plate in the emergency department Chest x-ray reviewed cavitary lesion right middle lobe unchanged   Assessment/Plan 73 year old male with acute on chronic respiratory failure with hypoxia and hypercapnia secondary to COPD exacerbation Principal Problem:   Acute respiratory failure with hypoxia (HCC) with hypercapnia-continue BiPAP wean as tolerates.  Repeat ABG later on tonight.  Treat COPD as below.  Active Problems:   COPD exacerbation (HCC)-frequent bronchodilators, IV Solu-Medrol    Essential hypertension-continue beta-blocker    Atrial flutter with rapid ventricular response (HCC)-rate less than 110 resume home meds    Cavitary lesion of lung-management per PCCM team.  Appears this is been present since 2002.    ETOH abuse-patient reports no  drinking since he was discharged last week and has been in rehab.     DVT prophylaxis: Lovenox Code  Status: Full Family Communication: None Disposition Plan: Days Consults called: PCCM Admission status: Admit to stepdown   DAVID,RACHAL A MD [facility]  If 7PM-7AM, please contact night-coverage [email or web address] Password [email or web address]  10/22/2018, 12:05 AM

## 2018-10-22 NOTE — Progress Notes (Signed)
Patient took himself off of BIPAP. RT informed patient that he needs to wear BIPAP however patient continues to refuse to wear it. RT placed patient on 2L [place] and informed MD. RT will monitor as needed.

## 2018-10-22 NOTE — Progress Notes (Signed)
Nutrition Brief Note  RD consulted for assessment of nutritional status/ needs per COPD GOLD protocol.   Wt Readings from Last 15 Encounters:  10/22/18 112.4 kg  10/17/18 115.8 kg  10/14/16 116.7 kg  08/08/15 120.2 kg  03/16/13 97.9 kg  12/25/12 81.77 kg   73 year old male with acute on chronic respiratory failure with hypoxia and hypercapnia secondary to COPD exacerbation  Pt admitted with COPD exacerbation.   Case discussed with RN prior to visit, who reports pt recently arrived to the units and was ready to eat.   Spoke with pt, who was flat but cooperative at time of visit. He reports he has a great appetite and is eager to eat lunch when it arrives. Typical intake is 3 meals per day (Breakfast: eggs and grits; Lunch: sandwich; Dinner: potato, salad, and pork chop).   Pt denies any weight loss. He reports UBW is around 250#.   Nutrition-Focused physical exam completed. Findings are no fat depletion, no muscle depletion, and mild edema.   Discussed importance of good meal and supplement intake to promote healing. Pt denies any further nutritional needs or concerns.   Last Hgb A1c: 5.7 (12/24/2012).   Labs reviewed: CBGS: 170 (inpatient orders for glycemic control are 0-9 units insulin aspart TID with meals).   Body mass index is 32.69 kg/m. Patient meets criteria for obesity, class I based on current BMI.   Current diet order is Heart Healthy, patient is consuming approximately n/a% of meals at this time. Labs and medications reviewed.   No nutrition interventions warranted at this time. If nutrition issues arise, please consult RD.   Clarissa Laird A. Mayford Knife, RD, LDN, CDE Pager: [phone] After hours Pager: [phone]

## 2018-10-22 NOTE — ED Notes (Addendum)
Pt refused blood draw.  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  Nurse notified

## 2018-10-22 NOTE — Progress Notes (Signed)
PT Cancellation Note  Patient Details Name: Albert Hess MRN: [id number] DOB: 31-Jan-1946   Cancelled Treatment:    Reason Eval/Treat Not Completed: Other (comment)(Pt too fatigued.  Wants to work with PT tomorrow. )   [name] 10/22/2018, 2:48 PM Milynn Quirion,PT Acute Rehabilitation Services Pager:  [phone]  Office:  [phone]

## 2018-10-22 NOTE — ED Notes (Signed)
Pt  Moved to a regular hospital bed for comfort

## 2018-10-22 NOTE — Progress Notes (Signed)
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  Patient was found to be hypoxic in the rehab and was sent for evaluation here.  Admitted for the COPD exacerbation.  He improved with BiPAP and is currently on nasal cannula.  ABG showed hypoxia and hypercarbia.  He is on 3 L oxygen at home. Patient was started on IV steroids.  Continue bronchodilators.  He has history of atrial flutter and was in RVR on presentation but the heart rate is more controlled now.  He is not on anticoagulation due to his history of chronic alcohol abuse.Patient has a right lower lobe cavitary lesion that has been present from before and is not acute.  PCCM was consulted for this and recommended outpatient follow-up.  CT scan showed thin-walled cavitary lesion in the right lower lung with a small air-fluid level, unchanged since previous study.  There is no evidence of pulmonary embolus. Patient seen and examined the bedside this morning in the emergency department.  Currently he is hemodynamically stable.  His respiratory status is improved.  He is off BiPAP.   Patient seen by Dr. Onalee Hua this morning.  We will continue to monitor him.

## 2018-10-22 NOTE — ED Notes (Signed)
Pt refusing to stay on the bi-pap

## 2018-10-22 NOTE — ED Notes (Signed)
The pt has refused to let plebotomy draw blood x2  He reports that  We cannot draw his blood he got motphus in [place].  I told him we had drawn his blood he was not convinced. He also reports that it is against  The law to draw his blood

## 2018-10-22 NOTE — Progress Notes (Signed)
   10/22/18 1500  OT Visit Information  Last OT Received On 10/22/18  Reason Eval/Treat Not Completed Fatigue/lethargy limiting ability to participate  Martie Round, OTR/L Acute Rehabilitation Services Pager: [phone] Office: [phone]

## 2018-10-22 NOTE — Plan of Care (Signed)

## 2018-10-23 DIAGNOSIS — J9602 Acute respiratory failure with hypercapnia: Secondary | ICD-10-CM

## 2018-10-23 LAB — BASIC METABOLIC PANEL
Anion gap: 10 (ref 5–15)
BUN: 18 mg/dL (ref 8–23)
CO2: 36 mmol/L — ABNORMAL HIGH (ref 22–32)
Calcium: 9.1 mg/dL (ref 8.9–10.3)
Chloride: 90 mmol/L — ABNORMAL LOW (ref 98–111)
Creatinine, Ser: 0.95 mg/dL (ref 0.61–1.24)
GFR calc Af Amer: >60 mL/min (ref >60)
GFR calc non Af Amer: >60 mL/min (ref >60)
Glucose, Bld: 193 mg/dL — ABNORMAL HIGH (ref 70–99)
Potassium: 4.9 mmol/L (ref 3.5–5.1)
Sodium: 136 mmol/L (ref 135–145)

## 2018-10-23 LAB — GLUCOSE, CAPILLARY
Glucose-Capillary: 141 mg/dL — ABNORMAL HIGH (ref 70–99)
Glucose-Capillary: 162 mg/dL — ABNORMAL HIGH (ref 70–99)
Glucose-Capillary: 134 mg/dL — ABNORMAL HIGH (ref 70–99)
Glucose-Capillary: 144 mg/dL — ABNORMAL HIGH (ref 70–99)

## 2018-10-23 MED ORDER — FOLIC ACID 1 MG PO TABS
1.0000 mg | ORAL_TABLET | Freq: Every day | ORAL | Status: DC
  Administered 2018-10-23 – 2018-10-24 (×2): 1 mg via ORAL
  Filled 2018-10-23 (×2): qty 1

## 2018-10-23 MED ORDER — IPRATROPIUM-ALBUTEROL 0.5-2.5 (3) MG/3ML IN SOLN
3.0000 mL | Freq: Three times a day (TID) | RESPIRATORY_TRACT | Status: DC
  Filled 2018-10-23: qty 3

## 2018-10-23 MED ORDER — VITAMIN B-1 100 MG PO TABS
100.0000 mg | ORAL_TABLET | Freq: Every day | ORAL | Status: DC
  Administered 2018-10-23 – 2018-10-24 (×2): 100 mg via ORAL
  Filled 2018-10-23 (×2): qty 1

## 2018-10-23 MED ORDER — GUAIFENESIN ER 600 MG PO TB12
1200.0000 mg | ORAL_TABLET | Freq: Two times a day (BID) | ORAL | Status: DC
  Administered 2018-10-23 – 2018-10-24 (×3): 1200 mg via ORAL
  Filled 2018-10-23 (×3): qty 2

## 2018-10-23 MED ORDER — PREDNISONE 20 MG PO TABS
40.0000 mg | ORAL_TABLET | Freq: Every day | ORAL | Status: DC
  Administered 2018-10-24: 40 mg via ORAL
  Filled 2018-10-23: qty 2

## 2018-10-23 NOTE — Evaluation (Addendum)
Physical Therapy Evaluation Patient Details Name: DANYON BELT MRN: [id number] DOB: 1946/06/09 Today's Date: 10/23/2018   History of Present Illness  73 y.o. male w/ h/o heavy ETOH use, T2DM, HTN, HLD, bipolar disorder, Schizophrenia and PTSD brought to the ED after being found by family on the floor after a fall, sustained multiple lacerations, periorbital hematoma and nasal bone fracture, also found to be in atrial flutter w/ RVR and being treated for possible aspiration PNA. D/c to SNF then readmitted for SOB.  Clinical Impression  Orders received for PT evaluation. Patient demonstrates deficits in functional mobility as indicated below. Will benefit from continued skilled PT to address deficits and maximize function. Will see as indicated and progress as tolerated.  OF NOTE: patient ambulated room air with desaturation to 88% for very short distance 35', improved to >90 with 2 liters , 95% with 3 liters. At this time, feel return to SNF for post acute rehab remains appropriate.    Follow Up Recommendations SNF;Supervision/Assistance - 24 hour    Equipment Recommendations  Other (comment)(TBD at next venue)    Recommendations for Other Services       Precautions / Restrictions Precautions Precautions: Fall;Other (comment) Precaution Comments: watch sats and RR Restrictions Weight Bearing Restrictions: No      Mobility  Bed Mobility Overal bed mobility: Needs Assistance Bed Mobility: Supine to Sit     Supine to sit: Min assist;HOB elevated     General bed mobility comments: Min assist for safety and to elevate trunk and rotate to EOB, increased time and effort.  Transfers Overall transfer level: Needs assistance Equipment used: Rolling walker (2 wheeled) Transfers: Sit to/from [employer] Sit to Stand: Min assist Stand pivot transfers: Min assist       General transfer comment: Vcs for hand placement and positioning, min assist for safety and  stability during transition, min guard to power up from [facility].  Ambulation/Gait Ambulation/Gait assistance: +2 safety/equipment;Min assist Gait Distance (Feet): 35 Feet Assistive device: Rolling walker (2 wheeled) Gait Pattern/deviations: Step-through pattern;Decreased stride length Gait velocity: decreased Gait velocity interpretation: <1.31 ft/sec, indicative of household ambulator General Gait Details: unsteady gait. Assist to maintain balance.  Stairs            Wheelchair Mobility    Modified Rankin (Stroke Patients Only)       Balance Overall balance assessment: Needs assistance Sitting-balance support: No upper extremity supported;Feet supported Sitting balance-Leahy Scale: Good     Standing balance support: During functional activity;Bilateral upper extremity supported Standing balance-Leahy Scale: Poor Standing balance comment: able to static stand for brief periods with external support required upon fatigue                             Pertinent Vitals/Pain Pain Assessment: No/denies pain    Home Living Family/patient expects to be discharged to:: Private residence Living Arrangements: Alone   Type of Home: Apartment Home Access: Level entry     Home Layout: One level Home Equipment: None      Prior Function Level of Independence: Independent         Comments: Drives     Hand Dominance   Dominant Hand: Right    Extremity/Trunk Assessment   Upper Extremity Assessment Upper Extremity Assessment: Generalized weakness    Lower Extremity Assessment Lower Extremity Assessment: Generalized weakness    Cervical / Trunk Assessment Cervical / Trunk Assessment: Normal  Communication   Communication: No  difficulties  Cognition Arousal/Alertness: Awake/alert Behavior During Therapy: Flat affect Overall Cognitive Status: Within Functional Limits for tasks assessed Area of Impairment: Safety/judgement;Problem solving                    Current Attention Level: Selective Memory: Decreased short-term memory   Safety/Judgement: Decreased awareness of safety;Decreased awareness of deficits   Problem Solving: Requires verbal cues        General Comments General comments (skin integrity, edema, etc.): patient ambulated room air with desaturation to 88%, improved to >90 with 2 liters [place].     Exercises     Assessment/Plan    PT Assessment Patient needs continued PT services  PT Problem List Decreased balance;Decreased strength;Decreased cognition;Decreased knowledge of precautions;Decreased mobility;Decreased knowledge of use of DME;Decreased safety awareness;Decreased activity tolerance;Cardiopulmonary status limiting activity       PT Treatment Interventions DME instruction;Functional mobility training;Balance training;Patient/family education;Gait training;Therapeutic activities;Neuromuscular re-education;Therapeutic exercise;Cognitive remediation    PT Goals (Current goals can be found in the Care Plan section)  Acute Rehab PT Goals Patient Stated Goal: to breathe better PT Goal Formulation: With patient Time For Goal Achievement: 11/06/18 Potential to Achieve Goals: Good    Frequency Min 2X/week   Barriers to discharge Decreased caregiver support      Co-evaluation PT/OT/SLP Co-Evaluation/Treatment: Yes Reason for Co-Treatment: To address functional/ADL transfers PT goals addressed during session: Mobility/safety with mobility OT goals addressed during session: Proper use of Adaptive equipment and DME       AM-PAC PT "6 Clicks" Mobility  Outcome Measure Help needed turning from your back to your side while in a flat bed without using bedrails?: A Little Help needed moving from lying on your back to sitting on the side of a flat bed without using bedrails?: A Little Help needed moving to and from a bed to a chair (including a wheelchair)?: A Lot Help needed standing up from a chair using  your arms (e.g., wheelchair or bedside chair)?: A Little Help needed to walk in hospital room?: A Lot Help needed climbing 3-5 steps with a railing? : A Lot 6 Click Score: 15    End of Session Equipment Utilized During Treatment: Gait belt;Oxygen Activity Tolerance: Patient limited by fatigue Patient left: in chair;with call bell/phone within reach Nurse Communication: Mobility status PT Visit Diagnosis: Unsteadiness on feet (R26.81);Difficulty in walking, not elsewhere classified (R26.2);History of falling (Z91.81)    Time: 5916-3846 PT Time Calculation (min) (ACUTE ONLY): 21 min   Charges:   PT Evaluation $PT Eval Moderate Complexity: 1 Mod          Charlotte Crumb, PT DPT  Board Certified Neurologic Specialist Acute Rehabilitation Services Pager [phone] Office [phone]   Fabio Asa 10/23/2018, 1:45 PM

## 2018-10-23 NOTE — Progress Notes (Signed)
SATURATION QUALIFICATIONS: (This note is used to comply with regulatory documentation for home oxygen)  Patient Saturations on Room Air at Rest = 90%  Patient Saturations on Room Air while Ambulating = 88%  Patient Saturations on 2 Liters of oxygen while Ambulating = 90%  Patient Saturations on 3 Liters of oxygen while Ambulating = 95%  Please briefly explain why patient needs home oxygen: desaturations with increased WOB noted  Charlotte Crumb, PT DPT  Board Certified Neurologic Specialist Acute Rehabilitation Services Pager [phone] Office [phone]

## 2018-10-23 NOTE — Clinical Social Work Note (Addendum)
Faxed clinicals to [facility] for authorization review. OT eval pending.  Charlynn Court, CSW [phone]  4:02 pm Faxed OT evaluation to [facility].  Charlynn Court, CSW [phone]

## 2018-10-23 NOTE — Progress Notes (Signed)
Desats to 80's on room air. Placed back on2l [place] with pulse ox- 93% cont. To monitor.
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
## 2018-10-23 NOTE — Progress Notes (Signed)
Would frequent ask for the bedpan, not even 1 min. when passing out gas would ask for the bedpan to be removed, claimed that he is afraid of soiling the bed. Encouraged to  Have bedpan underneath for 5 min maybe to let go of his bowel, but refused.

## 2018-10-23 NOTE — Plan of Care (Signed)
  Problem: Education: Goal: Knowledge of General Education information will improve Description: Including pain rating scale, medication(s)/side effects and non-pharmacologic comfort measures Outcome: Not Progressing   Problem: Health Behavior/Discharge Planning: Goal: Ability to manage health-related needs will improve Outcome: Not Progressing   Problem: Clinical Measurements: Goal: Respiratory complications will improve Outcome: Progressing   
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
## 2018-10-23 NOTE — Progress Notes (Signed)
NAME:  Randon GoldsmithMark M Huizinga, MRN:  [id number], DOB:  1946/07/16, LOS: 1 ADMISSION DATE:  10/21/2018, CONSULTATION DATE:  2/2 REFERRING MD:  Dr Pilar PlateBero EDP, CHIEF COMPLAINT:  SOB   Brief History   73 year old male with recent admission for PNA and AFRVR. He has a history of alcohol abuse. Now presenting with worsening dyspnea and and AMS requiring BiPAP.  History of present illness   73 year old male with PMH as below, which is significant for alcohol abuse, AF RVR (not on anticoagulation), bipolar disorder, COPD, and HTN. He was recently admitted for new onset AF and pneumonia 1/23 > 1/31. At that time he had fallen and presented with multiple facial bruises and a nasal bone fracture. Falls secondary to alcohol use. At that point he was an everyday drinker of vodka. Currently reports he has not had a drink for one week. AF was treated with diltiazem and no anticoagulation due to falls. Pneumonia treated with antibiotics. 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 He was discharged on 3L O2.  Then on 2/2 he again [facility] with complaints of SOB and AMS. At that point, he was a poor historian and little HPI was available. Despite supplemental O2 and treatment for COPD exacerbation his mental status worsened and VBG was concerning for respiratory acidosis. He was started on BiPAP and his mental status seemed to improve. CXR unremarkable with the exception of a cavitary lesion, which is known and was unchanged. PCCM was asked to evaluate.   Past Medical History   has a past medical history of AKI (acute kidney injury) (HCC) (03/16/2013), Alcohol abuse, Anemia (03/16/2013), Aspiration pneumonia (HCC) (10/11/2018), Atrial flutter (HCC), Bipolar 2 disorder (HCC), COPD (chronic obstructive pulmonary disease) (HCC), Diabetes mellitus without complication (HCC), High cholesterol, Hypertension, Obesity, PTSD (post-traumatic stress disorder), and Schizophrenia  (HCC).   Significant Hospital Events   1/23 > 1/31 admit for AF and PNA 2/2 admit for SOB and AMS.   Consults:  PCCM  Procedures:    Significant Diagnostic Tests:  Sputum cytology 2/2 > CTA 10/22/2018>> Negative for PE Thin walled cavitary lesion in the right lower lung with small air-fluid level, unchanged since previous study. Focal pneumonia L lower lung  Micro Data:  Sputum 2/2 >   Antimicrobials:  None  Interim history/subjective:  States he is feeling better. Remains on 2 L [place] Sats 96% Needs to ambulate/ work with PT  Objective   Blood pressure 130/86, pulse 76, temperature 98.3 F (36.8 C), temperature source Oral, resp. rate (!) 23, height 6\' 1"  (1.854 m), weight 112.4 kg, SpO2 96 %.        Intake/Output Summary (Last 24 hours) at 10/23/2018 1302 Last data filed at 10/23/2018 0900 Gross per 24 hour  Intake 407.08 ml  Output 1150 ml  Net -742.92 ml   Filed Weights   10/21/18 1838 10/22/18 1145  Weight: 110.7 kg 112.4 kg    Examination: General: Elderly male in NAD, supine in bed, awake and alert HENT: bruising over the right cheek bone.Abrasion to bridge of nose  Lungs: Bilateral excursion, Coarse throughout, diminished bases with few rales noted. RR 23 Cardiovascular: S1, S2, Irr, NO RMG, a fib per monitor Abdomen: Soft, non-tender, non-distended, BS +, Obese Extremities: No acute deformity or ROM limitation. Trace edema.  Neuro:  Alert and oriented with periods of confusion, follows commands   Resolved Hospital Problem list     Assessment &  Plan:   Respiratory distress: etiology not entirely clear. He is not able to give a good history of the events leading to his presentation. CXR is non-acte and is unchanged from prior. This would point towards COPD exacerbation. ABG after about an hour of BiPAP shows a barely compensated chronic respiratory acidosis, which would also lead towards this diagnosis. He does not appear to be volume up on exam, with  recent echo showing strong EF. CHF considered less likely.  Plan: - BiPAP PRN, may end up needing some sort on long term nocturnal support. - Wean oxygen for sats of 88-92%   - IV steroids with rapid taper.  - Scheduled duoneb - prn albuterol.  - Strict I&O - Aggressive Pulmonary Toilet - IS q 1 while awake - Mobilize - ABG  Now and prn - Trend CXR - Trend fever curve and WBC - Ambulatory saturation prior to dc - Will need Pulmonary follow up   Atrial fibrillation with RVR - tele monitoring - per primary  Cavitary Lesion of the Right lung - felt to be abscess from 6 years prior with cavity that never healed.  - Pulmonary follow up as scheduled.   Alcohol abuse:  He reports no drinks for the past week - Usually every day drinker of vodka - Monitor for s/s withdrawal - CIWA as needed - Consider trending LFT's  DM2 - Per primary   Best practice:  Diet: NPO while on BiPAP Pain/Anxiety/Delirium protocol (if indicated): na VAP protocol (if indicated): na DVT prophylaxis: per primary GI prophylaxis: per primary Glucose control: per primary Mobility: BR Code Status: Did not address goals of care.  Family Communication: no family available Disposition: SDU under [facility]  Labs   CBC: Recent Labs  Lab 10/21/18 1940  10/21/18 2223 10/21/18 2334 10/22/18 0033 10/22/18 0556 10/22/18 1211  WBC 4.9  --   --   --  4.7  --  4.1  NEUTROABS  --   --   --   --   --   --  3.4  HGB 13.1   < > 11.9* 12.9* 14.0 14.3 13.5  HCT 38.8*   < > 35.0* 38.0* 41.0 42.0 40.9  MCV 103.2*  --   --   --  101.7*  --  101.7*  PLT 193  --   --   --  171  --  201   < > = values in this interval not displayed.    Basic Metabolic Panel: Recent Labs  Lab 10/17/18 0649 10/18/18 0406 10/21/18 1940  10/21/18 2223 10/21/18 2334 10/22/18 0033 10/22/18 0556 10/22/18 0720 10/23/18 0257  NA 135 135 134*   < > 132* 133*  --  134* 138 136  K 4.9 4.9 4.6   < > 4.3 4.7  --  4.7 5.0 4.9  CL 97* 95*  88*  --   --   --   --   --  94* 90*  CO2 31 32 36*  --   --   --   --   --  34* 36*  GLUCOSE 113* 108* 120*  --   --   --   --   --  166* 193*  BUN 10 9 10   --   --   --   --   --  12 18  CREATININE 0.86 0.85 0.86  --   --   --  0.84  --  0.90 0.95  CALCIUM 8.6* 8.6* 9.3  --   --   --   --   --  9.0 9.1  MG 1.8  --   --   --   --   --   --   --   --   --    < > = values in this interval not displayed.   GFR: Estimated Creatinine Clearance: 93.7 mL/min (by C-G formula based on SCr of 0.95 mg/dL). Recent Labs  Lab 10/21/18 1940 10/22/18 0033 10/22/18 1211  WBC 4.9 4.7 4.1  LATICACIDVEN 1.6  --   --     Liver Function Tests: No results for input(s): AST, ALT, ALKPHOS, BILITOT, PROT, ALBUMIN in the last 168 hours. No results for input(s): LIPASE, AMYLASE in the last 168 hours. No results for input(s): AMMONIA in the last 168 hours.  ABG    Component Value Date/Time   PHART 7.369 10/21/2018 2334   PCO2ART 73.3 (HH) 10/21/2018 2334   PO2ART 101.0 10/21/2018 2334   HCO3 41.2 (H) 10/22/2018 0556   TCO2 43 (H) 10/22/2018 0556   O2SAT 98.0 10/22/2018 0556     Coagulation Profile: No results for input(s): INR, PROTIME in the last 168 hours.  Cardiac Enzymes: Recent Labs  Lab 10/21/18 1940 10/22/18 0033 10/22/18 0720 10/22/18 1211  CKTOTAL 41*  --   --   --   TROPONINI  --  <0.03 <0.03 <0.03    HbA1C: Hgb A1c MFr Bld  Date/Time Value Ref Range Status  12/24/2012 01:36 PM 5.7 (H) <5.7 % Final    Comment:    (NOTE)                                                                       According to the ADA Clinical Practice Recommendations for 2011, when HbA1c is used as a screening test:  >=6.5%   Diagnostic of Diabetes Mellitus           (if abnormal result is confirmed) 5.7-6.4%   Increased risk of developing Diabetes Mellitus References:Diagnosis and Classification of Diabetes Mellitus,Diabetes Care,2011,34(Suppl 1):S62-S69 and Standards of Medical Care in           Diabetes - 2011,Diabetes Care,2011,34 (Suppl 1):S11-S61.    CBG: Recent Labs  Lab 10/22/18 1201 10/22/18 1654 10/22/18 2116 10/23/18 0804 10/23/18 1157  GLUCAP 129* 215* 121* 141* 162*    Review of Systems:   Bolds are positive > limited by BiPAP and AMS Constitutional: weight loss, gain, night sweats, Fevers, chills, fatigue .  HEENT: headaches, Sore throat, sneezing, nasal congestion, post nasal drip, Difficulty swallowing, Tooth/dental problems, visual complaints visual changes, ear ache CV:  chest pain, radiates:,Orthopnea, PND, swelling in lower extremities, dizziness, palpitations, syncope.  GI  heartburn, indigestion, abdominal pain, nausea, vomiting, diarrhea, change in bowel habits, loss of appetite, bloody stools.  Resp: cough, productive: , hemoptysis, dyspnea, chest pain, pleuritic.  Skin: rash or itching or icterus GU: dysuria, change in color of urine, urgency or frequency. flank pain, hematuria  MS: joint pain or swelling. decreased range of motion  Psych: change in mood or affect. depression or anxiety.  Neuro: difficulty with speech, weakness, numbness, ataxia    Past Medical History  He,  has a past medical history of AKI (acute kidney injury) (HCC) (03/16/2013), Alcohol abuse, Anemia (03/16/2013), Aspiration pneumonia (HCC) (10/11/2018), Atrial flutter (HCC),  Bipolar 2 disorder (HCC), COPD (chronic obstructive pulmonary disease) (HCC), Diabetes mellitus without complication (HCC), High cholesterol, Hypertension, Obesity, PTSD (post-traumatic stress disorder), and Schizophrenia (HCC).   Surgical History    Past Surgical History:  Procedure Laterality Date  . right side     pt points to R side for surgery     Social History   reports that he has quit smoking. His smoking use included cigarettes. He smoked 0.00 packs per day. He has never used smokeless tobacco. He reports current alcohol use. He reports current drug use. Drug: Marijuana.   Family History    His family history includes Suicidality in his father.   Allergies No Known Allergies   Home Medications  Prior to Admission medications   Medication Sig Start Date End Date Taking? Authorizing Provider  acetaminophen (TYLENOL) 325 MG tablet Take 2 tablets (650 mg total) by mouth every 6 (six) hours as needed for mild pain, fever or headache (or Fever >/= 101). 10/19/18  Yes Elgergawy, Leana Roeawood S, MD  insulin aspart (NOVOLOG) 100 UNIT/ML injection Inject 0-9 Units into the skin 3 (three) times daily with meals. 10/19/18  Yes Elgergawy, Leana Roeawood S, MD  Ipratropium-Albuterol (COMBIVENT RESPIMAT) 20-100 MCG/ACT AERS respimat Inhale 1 puff into the lungs every 6 (six) hours as needed for wheezing or shortness of breath. 10/18/16  Yes Hongalgi, Maximino GreenlandAnand D, MD  metoprolol tartrate (LOPRESSOR) 100 MG tablet Take 1 tablet (100 mg total) by mouth 2 (two) times daily. 10/19/18  Yes Elgergawy, Leana Roeawood S, MD  Multiple Vitamin (MULTIVITAMIN WITH MINERALS) TABS tablet Take 1 tablet by mouth daily. 10/19/16  Yes Hongalgi, Maximino GreenlandAnand D, MD  pravastatin (PRAVACHOL) 40 MG tablet Take 1 tablet (40 mg total) by mouth daily. 10/18/16  Yes Hongalgi, Maximino GreenlandAnand D, MD  thiamine 100 MG tablet Take 1 tablet (100 mg total) by mouth daily. 10/19/16  Yes Elease EtienneHongalgi, Anand D, MD     Critical care time:      Bevelyn NgoSarah F. Lam Mccubbins, AGACNP-BC [facility] Pager # [phone] After 4 p call [phone] 10/23/2018 1:02 PM

## 2018-10-23 NOTE — Clinical Social Work Note (Signed)
Clinical Social Work Assessment  Patient Details  Name: Albert Hess MRN: [id number] Date of Birth: October 15, 1945  Date of referral:  10/23/18               Reason for consult:  Discharge Planning                Permission sought to share information with:  Facility Art therapist granted to share information::     Name::        Agency::  [facility]  Relationship::     Contact Information:     Housing/Transportation Living arrangements for the past 2 months:  [place], [place] of Information:  Patient, Medical Team, Facility Patient Interpreter Needed:  None Criminal Activity/Legal Involvement Pertinent to Current Situation/Hospitalization:  No - Comment as needed Significant Relationships:  Adult Children Lives with:  Self Do you feel safe going back to the place where you live?  Yes Need for family participation in patient care:  Yes (Comment)  Care giving concerns:  Patient is a short-term rehab resident at [facility].   Social Worker assessment / plan:  CSW met with patient. No supports at bedside. CSW introduced role and explained that discharge planning would be discussed. Patient confirmed he was admitted from [facility] and plans to return at discharge for further rehab. Discussed that CSW is waiting on PT and OT evaluations before sending clinicals to [facility] for insurance authorization. No further concerns. CSW encouraged patient to contact CSW as needed. CSW will continue to follow patient for support and facilitate discharge back to SNF once authorization obtained.  Employment status:  Retired Nurse, adult PT Recommendations:  Not assessed at this time Information / Referral to community resources:  [place]  Patient/Family's Response to care:  Patient agreeable to return to SNF. Patient's daughter supportive and involved in  patient's care. Patient appreciated social work intervention.  Patient/Family's Understanding of and Emotional Response to Diagnosis, Current Treatment, and Prognosis:  Patient has a good understanding of the reason for admission and plan to return to rehab after discharge. Patient appears happy with hospital care.  Emotional Assessment Appearance:  Appears stated age Attitude/Demeanor/Rapport:  Engaged, Gracious Affect (typically observed):  Accepting, Appropriate, Calm, Pleasant Orientation:  Oriented to Self, Oriented to Place, Oriented to  Time, Oriented to Situation Alcohol / Substance use:  Alcohol Use Psych involvement (Current and /or in the community):  No (Comment)  Discharge Needs  Concerns to be addressed:  Care Coordination Readmission within the last 30 days:  Yes Current discharge risk:  Dependent with Mobility, Lives alone Barriers to Discharge:  Continued Medical Work up, [place], LCSW 10/23/2018, 11:23 AM

## 2018-10-23 NOTE — Progress Notes (Signed)
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  Patient was found to be hypoxic in the rehab and was sent for evaluation here.  Admitted for the COPD exacerbation.  He improved with BiPAP and is currently on nasal cannula.  ABG showed hypoxia and hypercarbia.   Patient was started on IV steroids, bronchodilators.  He has history of atrial flutter and was in RVR on presentation but the heart rate is more controlled now.  He is not on anticoagulation due to his history of chronic alcohol abuse.Patient has a right lower lobe cavitary lesion that has been present from before and is not acute.  PCCM was consulted for this and recommended outpatient follow-up.  CT scan showed thin-walled cavitary lesion in the right lower lung with a small air-fluid level, unchanged since previous study.  There is no evidence of pulmonary embolus. Plan is to discharge him back to skilled nursing facility after PT and OT evaluation.  Assessment & Plan:   Principal Problem:   Acute respiratory failure with hypoxia (HCC) Active Problems:   COPD exacerbation (HCC)   Essential hypertension   Diabetes mellitus without complication (HCC)   Atrial flutter with rapid ventricular response (HCC)   Avulsion of skin of face   Cavitary lesion of lung   ETOH abuse   Acute respiratory failure with hypoxia and hypercarbia: From COPD exacerbation.  Past smoker. Presented with hypoxia and hypercarbia.  Initially required BiPAP but now saturating fine on nasal cannula.  COPD exacerbation: Continue bronchodilators.Steroids changed to oral.  His respiratory status is stable at present.  He qualified for home oxygen.  He needs to  follow-up with pulmonology as an outpatient for outpatient PFTs.  Not on beta 2 agonist/anticholinergics at home.  Right lower lobe cavitary lesion: CT finding as above.  This is a chronic finding.  Patient was treated for aspiration pneumonia on his last admission and was discharged on antibiotics.  He has finished the course of antibiotics.  PCCM recommended to follow as an outpatient.  No indication of antibiotic therapy for now.  A flutter with RVR: Found to be in a flutter with RVR on presentation.  Currently heart rate is better controlled.  He is not on anticoagulation due to his alcoholism issues.  Continue rate control with metoprolol.  Chronic alcohol abuse: Has not drank since last week since he has been in rehab.  Continue to monitor for withdrawal.  Continue thiamine and folic acid.Marland Kitchen.  Disposition: Patient is from a skilled nursing facility.  Anticipate going back to the same skilled facility.  Social worker following.  Needs new PT/OT evaluation.  PT/OT consulted         DVT prophylaxis:Lovenox Code Status: Full Family Communication: Discussed with the daughter on phone Disposition Plan: Skilled nursing facility as soon as possible.   Consultants: PCCM  Procedures: None  Antimicrobials:  Anti-infectives (From admission, onward)   None      Subjective:  Seen and examined the bedside this morning.  Remains comfortable.  Hemodynamically stable.  Respiratory status has improved.  Denies any shortness of breath at rest.  He desaturated on room air so he qualified for home oxygen.   Objective: Vitals:  10/22/18 2357 10/23/18 0156 10/23/18 0347 10/23/18 0806  BP: 133/77  124/73 126/70  Pulse: 74  96 90  Resp: 19  (!) 26 (!) 22  Temp: (!) 97.5 F (36.4 C)  (!) 97.5 F (36.4 C) 98.9 F (37.2 C)  TempSrc: Oral  Oral Oral  SpO2: 94% 99% 96% 98%  Weight:      Height:        Intake/Output Summary (Last 24 hours) at 10/23/2018 1104 Last data filed at 10/23/2018  0900 Gross per 24 hour  Intake 407.08 ml  Output 1150 ml  Net -742.92 ml   Filed Weights   10/21/18 1838 10/22/18 1145  Weight: 110.7 kg 112.4 kg    Examination:  General exam: Appears calm and comfortable ,Not in distress,obese HEENT:PERRL,Oral mucosa moist, Ear/Nose normal on gross exam, some bruises around the bridge of the nose and face Respiratory system: Bilateral decreased air entry Cardiovascular system: Irregularly irregular, no JVD, murmurs, rubs, gallops or clicks. No pedal edema. Gastrointestinal system: Abdomen is nondistended, soft and nontender. No organomegaly or masses felt. Normal bowel sounds heard. Central nervous system: Alert and oriented. No focal neurological deficits. Extremities: No edema, no clubbing ,no cyanosis, distal peripheral pulses palpable. Skin: Scattered ecchymosis, lesions or ulcers,no icterus ,no pallor     Data Reviewed: I have personally reviewed following labs and imaging studies  CBC: Recent Labs  Lab 10/21/18 1940  10/21/18 2223 10/21/18 2334 10/22/18 0033 10/22/18 0556 10/22/18 1211  WBC 4.9  --   --   --  4.7  --  4.1  NEUTROABS  --   --   --   --   --   --  3.4  HGB 13.1   < > 11.9* 12.9* 14.0 14.3 13.5  HCT 38.8*   < > 35.0* 38.0* 41.0 42.0 40.9  MCV 103.2*  --   --   --  101.7*  --  101.7*  PLT 193  --   --   --  171  --  201   < > = values in this interval not displayed.   Basic Metabolic Panel: Recent Labs  Lab 10/17/18 0649 10/18/18 0406 10/21/18 1940  10/21/18 2223 10/21/18 2334 10/22/18 0033 10/22/18 0556 10/22/18 0720 10/23/18 0257  NA 135 135 134*   < > 132* 133*  --  134* 138 136  K 4.9 4.9 4.6   < > 4.3 4.7  --  4.7 5.0 4.9  CL 97* 95* 88*  --   --   --   --   --  94* 90*  CO2 31 32 36*  --   --   --   --   --  34* 36*  GLUCOSE 113* 108* 120*  --   --   --   --   --  166* 193*  BUN 10 9 10   --   --   --   --   --  12 18  CREATININE 0.86 0.85 0.86  --   --   --  0.84  --  0.90 0.95  CALCIUM 8.6*  8.6* 9.3  --   --   --   --   --  9.0 9.1  MG 1.8  --   --   --   --   --   --   --   --   --    < > = values in this interval not displayed.   GFR: Estimated Creatinine Clearance: 93.7  mL/min (by C-G formula based on SCr of 0.95 mg/dL). Liver Function Tests: No results for input(s): AST, ALT, ALKPHOS, BILITOT, PROT, ALBUMIN in the last 168 hours. No results for input(s): LIPASE, AMYLASE in the last 168 hours. No results for input(s): AMMONIA in the last 168 hours. Coagulation Profile: No results for input(s): INR, PROTIME in the last 168 hours. Cardiac Enzymes: Recent Labs  Lab 10/21/18 1940 10/22/18 0033 10/22/18 0720 10/22/18 1211  CKTOTAL 41*  --   --   --   TROPONINI  --  <0.03 <0.03 <0.03   BNP (last 3 results) No results for input(s): PROBNP in the last 8760 hours. HbA1C: No results for input(s): HGBA1C in the last 72 hours. CBG: Recent Labs  Lab 10/22/18 0826 10/22/18 1201 10/22/18 1654 10/22/18 2116 10/23/18 0804  GLUCAP 170* 129* 215* 121* 141*   Lipid Profile: No results for input(s): CHOL, HDL, LDLCALC, TRIG, CHOLHDL, LDLDIRECT in the last 72 hours. Thyroid Function Tests: No results for input(s): TSH, T4TOTAL, FREET4, T3FREE, THYROIDAB in the last 72 hours. Anemia Panel: No results for input(s): VITAMINB12, FOLATE, FERRITIN, TIBC, IRON, RETICCTPCT in the last 72 hours. Sepsis Labs: Recent Labs  Lab 10/21/18 1940  LATICACIDVEN 1.6    Recent Results (from the past 240 hour(s))  MRSA PCR Screening     Status: Abnormal   Collection Time: 10/22/18 12:10 PM  Result Value Ref Range Status   MRSA by PCR POSITIVE (A) NEGATIVE Final    Comment:        The GeneXpert MRSA Assay (FDA approved for NASAL specimens only), is one component of a comprehensive MRSA colonization surveillance program. It is not intended to diagnose MRSA infection nor to guide or monitor treatment for MRSA infections. RESULT CALLED TO, READ BACK BY AND VERIFIED WITH: Beverlyn Rouxe Cna RN  15:00 10/22/18 ([email or web address]) Performed at [facility], [address] [place], [place] [address]          Radiology Studies: Ct Head Wo Contrast  Result Date: 10/21/2018 CLINICAL DATA:  73 year old male with acute head injury from fall today. Initial encounter. EXAM: CT HEAD WITHOUT CONTRAST TECHNIQUE: Contiguous axial images were obtained from the base of the skull through the vertex without intravenous contrast. COMPARISON:  10/11/2018 and prior CTs FINDINGS: Brain: No evidence of acute infarction, hemorrhage, hydrocephalus, extra-axial collection or mass lesion/mass effect. Atrophy and chronic small-vessel white matter ischemic changes again noted. Vascular: Carotid and vertebral atherosclerotic calcifications noted. Skull: No acute abnormality. Remote nasal septum fracture again noted. Sinuses/Orbits: No acute abnormality Other: None IMPRESSION: 1. No evidence of acute intracranial abnormality 2. Atrophy and chronic small-vessel white matter ischemic changes. Electronically Signed   By: Harmon PierJeffrey  Hu M.D.   On: 10/21/2018 19:22   Ct Angio Chest Pe W Or Wo Contrast  Result Date: 10/22/2018 CLINICAL DATA:  Central chest pain and shortness of breath for 3 months. EXAM: CT ANGIOGRAPHY CHEST WITH CONTRAST TECHNIQUE: Multidetector CT imaging of the chest was performed using the standard protocol during bolus administration of intravenous contrast. Multiplanar CT image reconstructions and MIPs were obtained to evaluate the vascular anatomy. CONTRAST:  56mL ISOVUE-370 IOPAMIDOL (ISOVUE-370) INJECTION 76% COMPARISON:  10/11/2018 FINDINGS: Cardiovascular: Good opacification of the central and segmental pulmonary arteries. No focal filling defects. No evidence of significant pulmonary embolus. Normal heart size. No pericardial effusions. Coronary artery calcifications. Normal caliber thoracic aorta with scattered calcification. Mediastinum/Nodes: No significant lymphadenopathy in the chest. Esophagus is  decompressed. Lungs/Pleura: Thin walled cavitary lesion in the  right lower lung measuring 6.2 cm diameter and with small air-fluid level. This is unchanged since previous study. Focal area of patchy focal airspace infiltration seen most prominently in the left lower lung and right middle lung. This likely represents multifocal bronchopneumonia. No pleural effusions. No pneumothorax. Upper Abdomen: Cholelithiasis. Musculoskeletal: Degenerative changes in the spine. No destructive bone lesions. Review of the MIP images confirms the above findings. IMPRESSION: 1. No evidence of significant pulmonary embolus. 2. Thin walled cavitary lesion in the right lower lung with small air-fluid level, unchanged since previous study. 3. Patchy focal airspace disease in the left lower lung, similar to previous study. This probably represents focal area of pneumonia. 4. Cholelithiasis. Electronically Signed   By: Burman Nieves M.D.   On: 10/22/2018 01:47   Dg Chest Port 1 View  Result Date: 10/21/2018 CLINICAL DATA:  Shortness of breath EXAM: PORTABLE CHEST 1 VIEW COMPARISON:  10/16/2018 FINDINGS: Shallow inspiration. Normal heart size and pulmonary vascularity. Thin walled cavitary lesion measuring 6.7 cm diameter in the right mid lung is unchanged since previous study. Left lung is clear. No focal consolidation or airspace disease. No blunting of costophrenic angles. No pneumothorax. IMPRESSION: 1. No active disease. 2. Thin walled cavitary lesion in the right mid lung without change. Electronically Signed   By: Burman Nieves M.D.   On: 10/21/2018 19:14        Scheduled Meds: . Chlorhexidine Gluconate Cloth  6 each Topical Q0600  . enoxaparin (LOVENOX) injection  40 mg Subcutaneous Q24H  . insulin aspart  0-9 Units Subcutaneous TID WC  . ipratropium-albuterol  3 mL Nebulization Once  . ipratropium-albuterol  3 mL Nebulization TID  . lidocaine  1 application Topical Once  . mouth rinse  15 mL Mouth Rinse BID    . metoprolol tartrate  100 mg Oral BID  . mupirocin ointment  1 application Nasal BID  . [START ON 10/24/2018] predniSONE  40 mg Oral Q breakfast  . sodium chloride flush  3 mL Intravenous Q12H   Continuous Infusions: . sodium chloride       LOS: 1 day    Time spent: 35 mins.More than 50% of that time was spent in counseling and/or coordination of care.      Burnadette Pop, MD [facility] Pager [phone]  If 7PM-7AM, please contact night-coverage [email or web address] Password [email or web address] 10/23/2018, 11:04 AM

## 2018-10-24 DIAGNOSIS — M6281 Muscle weakness (generalized): Secondary | ICD-10-CM | POA: Diagnosis not present

## 2018-10-24 DIAGNOSIS — J441 Chronic obstructive pulmonary disease with (acute) exacerbation: Secondary | ICD-10-CM | POA: Diagnosis not present

## 2018-10-24 DIAGNOSIS — E785 Hyperlipidemia, unspecified: Secondary | ICD-10-CM | POA: Diagnosis not present

## 2018-10-24 DIAGNOSIS — I499 Cardiac arrhythmia, unspecified: Secondary | ICD-10-CM | POA: Diagnosis not present

## 2018-10-24 DIAGNOSIS — F10129 Alcohol abuse with intoxication, unspecified: Secondary | ICD-10-CM | POA: Diagnosis not present

## 2018-10-24 DIAGNOSIS — J9601 Acute respiratory failure with hypoxia: Secondary | ICD-10-CM | POA: Diagnosis not present

## 2018-10-24 DIAGNOSIS — R7989 Other specified abnormal findings of blood chemistry: Secondary | ICD-10-CM | POA: Diagnosis not present

## 2018-10-24 DIAGNOSIS — I483 Typical atrial flutter: Secondary | ICD-10-CM | POA: Diagnosis not present

## 2018-10-24 DIAGNOSIS — Z9181 History of falling: Secondary | ICD-10-CM | POA: Diagnosis not present

## 2018-10-24 DIAGNOSIS — J9600 Acute respiratory failure, unspecified whether with hypoxia or hypercapnia: Secondary | ICD-10-CM | POA: Diagnosis not present

## 2018-10-24 DIAGNOSIS — R279 Unspecified lack of coordination: Secondary | ICD-10-CM | POA: Diagnosis not present

## 2018-10-24 DIAGNOSIS — E7800 Pure hypercholesterolemia, unspecified: Secondary | ICD-10-CM | POA: Diagnosis not present

## 2018-10-24 DIAGNOSIS — F209 Schizophrenia, unspecified: Secondary | ICD-10-CM | POA: Diagnosis not present

## 2018-10-24 DIAGNOSIS — Z743 Need for continuous supervision: Secondary | ICD-10-CM | POA: Diagnosis not present

## 2018-10-24 DIAGNOSIS — J9602 Acute respiratory failure with hypercapnia: Secondary | ICD-10-CM | POA: Diagnosis not present

## 2018-10-24 DIAGNOSIS — F319 Bipolar disorder, unspecified: Secondary | ICD-10-CM | POA: Diagnosis not present

## 2018-10-24 DIAGNOSIS — I10 Essential (primary) hypertension: Secondary | ICD-10-CM | POA: Diagnosis not present

## 2018-10-24 DIAGNOSIS — J984 Other disorders of lung: Secondary | ICD-10-CM | POA: Diagnosis not present

## 2018-10-24 DIAGNOSIS — E119 Type 2 diabetes mellitus without complications: Secondary | ICD-10-CM | POA: Diagnosis not present

## 2018-10-24 DIAGNOSIS — R2689 Other abnormalities of gait and mobility: Secondary | ICD-10-CM | POA: Diagnosis not present

## 2018-10-24 LAB — HEPATIC FUNCTION PANEL
ALT: 102 U/L — ABNORMAL HIGH (ref 0–44)
AST: 83 U/L — ABNORMAL HIGH (ref 15–41)
Albumin: 2.8 g/dL — ABNORMAL LOW (ref 3.5–5.0)
Alkaline Phosphatase: 53 U/L (ref 38–126)
Bilirubin, Direct: 0.5 mg/dL — ABNORMAL HIGH (ref 0.0–0.2)
Indirect Bilirubin: 0.8 mg/dL (ref 0.3–0.9)
TOTAL PROTEIN: 5.7 g/dL — AB (ref 6.5–8.1)
Total Bilirubin: 1.3 mg/dL — ABNORMAL HIGH (ref 0.3–1.2)

## 2018-10-24 LAB — GLUCOSE, CAPILLARY
Glucose-Capillary: 101 mg/dL — ABNORMAL HIGH (ref 70–99)
Glucose-Capillary: 142 mg/dL — ABNORMAL HIGH (ref 70–99)

## 2018-10-24 LAB — MAGNESIUM: Magnesium: 1.7 mg/dL (ref 1.7–2.4)

## 2018-10-24 MED ORDER — FOLIC ACID 1 MG PO TABS: 1.0000 mg | ORAL_TABLET | Freq: Every day | ORAL | Status: AC

## 2018-10-24 MED ORDER — PREDNISONE 20 MG PO TABS: 40.0000 mg | ORAL_TABLET | Freq: Every day | ORAL | 0 refills | Status: AC

## 2018-10-24 NOTE — Discharge Summary (Signed)
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 Patient was found to be hypoxic in the rehab and was sent for evaluation here. Admitted for the COPD exacerbation. He improved with BiPAP and is currently on nasal cannula. ABG showed hypoxia and hypercarbia.  Patient was started on IV steroids, bronchodilators. He has history of atrial flutter and was in RVR on presentation but the heart rate is more controlled now. He is not on anticoagulation due to his history of chronic alcohol abuse.Patient has a right lower lobecavitary lesion that has been present from before and is not acute.PCCM was consulted for this and recommended outpatient follow-up. CT scan showed thin-walled cavitary lesion in the right lower lung with a small air-fluid level, unchanged since previous study. There is no evidence of pulmonary embolus. His respiratory status has stabilized. Plan is to discharge him back to skilled nursing facility today.  Following problems were addressed during his hospitalization:  Acute respiratory failure with hypoxia and hypercarbia: From COPD exacerbation.  Past smoker. Presented with hypoxia and hypercarbia.  Initially required BiPAP but now saturating fine on room air.  COPD exacerbation: Continue bronchodilators.Steroids changed to oral.  His respiratory status is stable at present.  He qualified for home oxygen yesterday but he is saturating fine on room air.Continue supplemental oxygen as  needed.  He needs to follow-up with pulmonology as an outpatient for outpatient PFTs.  Not on beta 2 agonist/anticholinergics at home. He has an appointment with pulmonology on 10/30/2018.  Right lower lobe cavitary lesion: CT finding as above.  This is a chronic finding.  Patient was treated for aspiration pneumonia on his last admission and was discharged on antibiotics.  He has finished the course of antibiotics.  PCCM recommended to follow as an outpatient.  No indication of antibiotic therapy for now.  A flutter with RVR: Found to be in a flutter with RVR on presentation.  Currently heart rate is better controlled.  He is not on anticoagulation due to his alcoholism issues.  Continue rate control with metoprolol.  Chronic alcohol abuse: Has not drank since last week since he has been in rehab.    Continue thiamine and folic acid.  Disposition: Patient is from a skilled nursing facility.      Discharge Diagnoses:  Principal Problem:   Acute respiratory failure with hypercapnia (HCC) Active Problems:   COPD exacerbation (HCC)   Essential hypertension   Diabetes mellitus without complication (HCC)   Atrial flutter with rapid ventricular response (HCC)   Avulsion of skin of face   Cavitary lesion of lung   ETOH abuse    Discharge Instructions  Discharge Instructions    Diet - low sodium heart healthy   Complete by:  As directed    Discharge instructions   Complete by:  As directed    1)Please stop drinking and smoking. 2)Continue medications as instructed. 3)Do Liver function test in 3 months.   Increase activity slowly   Complete by:  As directed      Allergies as of 10/24/2018   No Known Allergies  Medication List    STOP taking these medications   acetaminophen 325 MG tablet Commonly known as:  TYLENOL   pravastatin 40 MG tablet Commonly known as:  PRAVACHOL     TAKE these medications   folic acid 1 MG tablet Commonly known as:  FOLVITE Take 1  tablet (1 mg total) by mouth daily.   insulin aspart 100 UNIT/ML injection Commonly known as:  novoLOG Inject 0-9 Units into the skin 3 (three) times daily with meals.   Ipratropium-Albuterol 20-100 MCG/ACT Aers respimat Commonly known as:  COMBIVENT RESPIMAT Inhale 1 puff into the lungs every 6 (six) hours as needed for wheezing or shortness of breath.   metoprolol tartrate 100 MG tablet Commonly known as:  LOPRESSOR Take 1 tablet (100 mg total) by mouth 2 (two) times daily.   multivitamin with minerals Tabs tablet Take 1 tablet by mouth daily.   predniSONE 20 MG tablet Commonly known as:  DELTASONE Take 2 tablets (40 mg total) by mouth daily with breakfast for 4 days. Start taking on:  October 25, 2018   thiamine 100 MG tablet Take 1 tablet (100 mg total) by mouth daily.            Durable Medical Equipment  (From admission, onward)         Start     Ordered   10/23/18 1114  For home use only DME oxygen  Once    Question Answer Comment  Mode or (Route) Nasal cannula   Liters per Minute 2   Frequency Continuous (stationary and portable oxygen unit needed)   Oxygen delivery system Gas      10/23/18 1114          Contact information for follow-up providers    Glenford Bayley, NP Follow up on 10/30/2018.   Specialty:  Pulmonary Disease Why:  Appointment is at 11 am Please arrive 15 minutes early Contact information: [address] [place] [address] [phone]            Contact information for after-discharge care    Destination    [facility] Preferred SNF .   Service:  Skilled Nursing Contact information: [address] [place] [address] [phone]                 No Known Allergies  Consultations:  PCCM   Procedures/Studies: Dg Chest 2 View  Result Date: 10/16/2018 CLINICAL DATA:  Shortness of Breath EXAM: CHEST - 2 VIEW COMPARISON:  10/14/2018 FINDINGS: Stable cavitary lesion is  noted in the right lower lobe with air-fluid level within. No focal infiltrate is seen. Cardiac shadow is stable. No bony abnormality is noted. IMPRESSION: Stable appearance of right lower lobe cavitary lesion. No acute abnormality noted. Electronically Signed   By: Alcide Clever M.D.   On: 10/16/2018 09:46   Ct Head Wo Contrast  Result Date: 10/21/2018 CLINICAL DATA:  73 year old male with acute head injury from fall today. Initial encounter. EXAM: CT HEAD WITHOUT CONTRAST TECHNIQUE: Contiguous axial images were obtained from the base of the skull through the vertex without intravenous contrast. COMPARISON:  10/11/2018 and prior CTs FINDINGS: Brain: No evidence of acute infarction, hemorrhage, hydrocephalus, extra-axial collection or mass lesion/mass effect. Atrophy and chronic small-vessel white matter ischemic changes again noted. Vascular: Carotid and vertebral atherosclerotic calcifications noted. Skull: No acute abnormality. Remote nasal septum fracture again noted. Sinuses/Orbits: No acute abnormality Other: None IMPRESSION: 1. No evidence of acute intracranial abnormality 2.  Atrophy and chronic small-vessel white matter ischemic changes. Electronically Signed   By: Harmon PierJeffrey  Hu M.D.   On: 10/21/2018 19:22   Ct Head Wo Contrast  Result Date: 10/11/2018 CLINICAL DATA:  Neck and back pain and nasal aspiration following a fall today while intoxicated. EXAM: CT HEAD WITHOUT CONTRAST CT MAXILLOFACIAL WITHOUT CONTRAST CT CERVICAL SPINE WITHOUT CONTRAST TECHNIQUE: Multidetector CT imaging of the head, cervical spine, and maxillofacial structures were performed using the standard protocol without intravenous contrast. Multiplanar CT image reconstructions of the cervical spine and maxillofacial structures were also generated. COMPARISON:  Head and cervical spine CT dated 03/16/2013. FINDINGS: CT HEAD FINDINGS Brain: Mild-to-moderate enlargement of the ventricles and subarachnoid spaces with mild progression. Mild  to moderate patchy white matter low density in both cerebral hemispheres with mild progression. No intracranial hemorrhage, mass lesion or CT evidence of acute infarction. Vascular: No hyperdense vessel or unexpected calcification. Skull: Normal. Negative for fracture or focal lesion. Other: None. CT MAXILLOFACIAL FINDINGS Osseous: Interval fracture of the mid nasal septum with angulation and mild displacement of the anterior portion to the left. Small fracture of the inferior aspect of the distal nasal bone a mild inferiorly and posteriorly displaced fragment. Orbits: Intact. Sinuses: Moderate right inferior maxillary sinus mucosal thickening. Mild left maxillary sinus mucosal thickening with a small amount of fluid in the sinus. Soft tissues: Anterior nasal soft tissue irregularity superiorly with multiple small locules of air within the underlying soft tissues. CT CERVICAL SPINE FINDINGS Alignment: Mild reversal of the normal cervical lordosis. No subluxations. Skull base and vertebrae: No acute fracture. No primary bone lesion or focal pathologic process. Soft tissues and spinal canal: No prevertebral fluid or swelling. No visible canal hematoma. Disc levels: Multilevel degenerative changes, most pronounced at the C5-6 and C6-7 levels. Upper chest: Clear lung apices. Other: Bilateral carotid artery calcifications. IMPRESSION: 1. Interval fracture of the mid nasal septum with angulation and mild displacement of the anterior portion to the left. 2. No skull fracture or intracranial hemorrhage. 3. No cervical spine fracture or subluxation. 4. Mildly progressive atrophy and chronic small vessel white matter ischemic changes in both cerebral hemispheres. 5. Cervical spine degenerative changes. 6. Bilateral carotid artery atheromatous calcifications. 7. Chronic bilateral maxillary sinusitis with an acute component or blood in the left maxillary sinus. Electronically Signed   By: Beckie SaltsSteven  Reid M.D.   On: 10/11/2018  16:27   Ct Angio Chest Pe W Or Wo Contrast  Result Date: 10/22/2018 CLINICAL DATA:  Central chest pain and shortness of breath for 3 months. EXAM: CT ANGIOGRAPHY CHEST WITH CONTRAST TECHNIQUE: Multidetector CT imaging of the chest was performed using the standard protocol during bolus administration of intravenous contrast. Multiplanar CT image reconstructions and MIPs were obtained to evaluate the vascular anatomy. CONTRAST:  56mL ISOVUE-370 IOPAMIDOL (ISOVUE-370) INJECTION 76% COMPARISON:  10/11/2018 FINDINGS: Cardiovascular: Good opacification of the central and segmental pulmonary arteries. No focal filling defects. No evidence of significant pulmonary embolus. Normal heart size. No pericardial effusions. Coronary artery calcifications. Normal caliber thoracic aorta with scattered calcification. Mediastinum/Nodes: No significant lymphadenopathy in the chest. Esophagus is decompressed. Lungs/Pleura: Thin walled cavitary lesion in the right lower lung measuring 6.2 cm diameter and with small air-fluid level. This is unchanged since previous study. Focal area of patchy focal airspace infiltration seen most prominently in the left lower lung and right middle lung. This likely represents multifocal bronchopneumonia. No pleural effusions. No pneumothorax. Upper Abdomen: Cholelithiasis. Musculoskeletal: Degenerative changes in the spine. No destructive bone lesions.  Review of the MIP images confirms the above findings. IMPRESSION: 1. No evidence of significant pulmonary embolus. 2. Thin walled cavitary lesion in the right lower lung with small air-fluid level, unchanged since previous study. 3. Patchy focal airspace disease in the left lower lung, similar to previous study. This probably represents focal area of pneumonia. 4. Cholelithiasis. Electronically Signed   By: Burman Nieves M.D.   On: 10/22/2018 01:47   Ct Angio Chest Pe W And/or Wo Contrast  Result Date: 10/11/2018 CLINICAL DATA:  Chest pain  possible pulmonary embolism. EXAM: CT ANGIOGRAPHY CHEST WITH CONTRAST TECHNIQUE: Multidetector CT imaging of the chest was performed using the standard protocol during bolus administration of intravenous contrast. Multiplanar CT image reconstructions and MIPs were obtained to evaluate the vascular anatomy. CONTRAST:  ISOVUE-370 IOPAMIDOL (ISOVUE-370) INJECTION 76% COMPARISON:  Chest x-ray 10/11/2018 and 10/16/2016 FINDINGS: Cardiovascular: Heart is normal size. Calcified plaque over the left main and 3 vessel coronary arteries. Minimal calcified plaque over the thoracic aorta. Thoracic aorta is otherwise normal in caliber without evidence of dissection. Pulmonary arterial system is well opacified without evidence of emboli. Mediastinum/Nodes: No mediastinal or hilar adenopathy. Remaining mediastinal structures are normal. Lungs/Pleura: Lungs are adequately inflated with subtle hazy patchy airspace density bilaterally worse over the left lower lobe likely due to an infectious or inflammatory process. No effusion. Known chronic thin-walled cavitary lesion with small air-fluid level over the right lower lobe measuring 6.1 x 6.2 cm in AP and transverse dimension. This continues to slowly increase in size. There is no evidence of mural nodularity or focal wall thickening. 2 mm subpleural nodule over the lateral right upper lobe. Airways are otherwise unremarkable. Upper Abdomen: Mild diffuse low-attenuation of the liver. Minimal calcified plaque over the abdominal aorta. Mild cholelithiasis. Musculoskeletal: Degenerative change of the spine. Review of the MIP images confirms the above findings. IMPRESSION: No evidence of pulmonary embolism. Subtle bilateral patchy hazy airspace process worse over the left lower lobe likely an acute infectious or inflammatory process. Chronic slowly increase in size of a thin-walled cavitary lesion over the right lower lobe with small air-fluid level. No suspicious wall thickening  or mural nodularity. Aortic Atherosclerosis (ICD10-I70.0). Atherosclerotic coronary artery disease. Cholelithiasis. Mild hepatic steatosis. Electronically Signed   By: Elberta Fortis M.D.   On: 10/11/2018 19:11   Ct Cervical Spine Wo Contrast  Result Date: 10/11/2018 CLINICAL DATA:  Neck and back pain and nasal aspiration following a fall today while intoxicated. EXAM: CT HEAD WITHOUT CONTRAST CT MAXILLOFACIAL WITHOUT CONTRAST CT CERVICAL SPINE WITHOUT CONTRAST TECHNIQUE: Multidetector CT imaging of the head, cervical spine, and maxillofacial structures were performed using the standard protocol without intravenous contrast. Multiplanar CT image reconstructions of the cervical spine and maxillofacial structures were also generated. COMPARISON:  Head and cervical spine CT dated 03/16/2013. FINDINGS: CT HEAD FINDINGS Brain: Mild-to-moderate enlargement of the ventricles and subarachnoid spaces with mild progression. Mild to moderate patchy white matter low density in both cerebral hemispheres with mild progression. No intracranial hemorrhage, mass lesion or CT evidence of acute infarction. Vascular: No hyperdense vessel or unexpected calcification. Skull: Normal. Negative for fracture or focal lesion. Other: None. CT MAXILLOFACIAL FINDINGS Osseous: Interval fracture of the mid nasal septum with angulation and mild displacement of the anterior portion to the left. Small fracture of the inferior aspect of the distal nasal bone a mild inferiorly and posteriorly displaced fragment. Orbits: Intact. Sinuses: Moderate right inferior maxillary sinus mucosal thickening. Mild left maxillary sinus mucosal thickening with a  small amount of fluid in the sinus. Soft tissues: Anterior nasal soft tissue irregularity superiorly with multiple small locules of air within the underlying soft tissues. CT CERVICAL SPINE FINDINGS Alignment: Mild reversal of the normal cervical lordosis. No subluxations. Skull base and vertebrae: No acute  fracture. No primary bone lesion or focal pathologic process. Soft tissues and spinal canal: No prevertebral fluid or swelling. No visible canal hematoma. Disc levels: Multilevel degenerative changes, most pronounced at the C5-6 and C6-7 levels. Upper chest: Clear lung apices. Other: Bilateral carotid artery calcifications. IMPRESSION: 1. Interval fracture of the mid nasal septum with angulation and mild displacement of the anterior portion to the left. 2. No skull fracture or intracranial hemorrhage. 3. No cervical spine fracture or subluxation. 4. Mildly progressive atrophy and chronic small vessel white matter ischemic changes in both cerebral hemispheres. 5. Cervical spine degenerative changes. 6. Bilateral carotid artery atheromatous calcifications. 7. Chronic bilateral maxillary sinusitis with an acute component or blood in the left maxillary sinus. Electronically Signed   By: Beckie Salts M.D.   On: 10/11/2018 16:27   Dg Pelvis Portable  Result Date: 10/11/2018 CLINICAL DATA:  Larey Seat in home today EXAM: PORTABLE PELVIS 1-2 VIEWS COMPARISON:  Portable exam 1506 hours without priors for comparison FINDINGS: Osseous mineralization normal. Hip and SI joint spaces preserved. No acute fracture, dislocation, or bone destruction. IMPRESSION: No acute osseous abnormalities. Electronically Signed   By: Ulyses Southward M.D.   On: 10/11/2018 15:24   Dg Chest Port 1 View  Result Date: 10/21/2018 CLINICAL DATA:  Shortness of breath EXAM: PORTABLE CHEST 1 VIEW COMPARISON:  10/16/2018 FINDINGS: Shallow inspiration. Normal heart size and pulmonary vascularity. Thin walled cavitary lesion measuring 6.7 cm diameter in the right mid lung is unchanged since previous study. Left lung is clear. No focal consolidation or airspace disease. No blunting of costophrenic angles. No pneumothorax. IMPRESSION: 1. No active disease. 2. Thin walled cavitary lesion in the right mid lung without change. Electronically Signed   By: Burman Nieves M.D.   On: 10/21/2018 19:14   Dg Chest Port 1 View  Result Date: 10/14/2018 CLINICAL DATA:  Shortness of breath. Tachypnea. Paradoxical respiration. EXAM: PORTABLE CHEST 1 VIEW COMPARISON:  Radiograph and CT three days ago 10/11/2018 FINDINGS: The cardiomediastinal contours are normal. Thin-walled cyst in the right midlung measuring 6.8 x 6.7 cm is unchanged in the short interval. Ground-glass opacities on CT are not well visualized radiographically. Pulmonary vasculature is normal. No consolidation, pleural effusion, or pneumothorax. No acute osseous abnormalities are seen. IMPRESSION: 1. Unchanged radiographic appearance of the chest. Unchanged pulmonary cavitary lesion in the right midlung over the past 3 days. 2. Ground-glass opacities on CT have no radiographic correlate. Electronically Signed   By: Narda Rutherford M.D.   On: 10/14/2018 02:27   Dg Chest Portable 1 View  Result Date: 10/11/2018 CLINICAL DATA:  Larey Seat at home today, history hypertension, diabetes mellitus, COPD, smoker EXAM: PORTABLE CHEST 1 VIEW COMPARISON:  Portable exam 1506 hours compared to 10/16/2016 FINDINGS: Normal heart size, mediastinal contours, and pulmonary vascularity. 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 Lungs clear. No infiltrate, pleural effusion or pneumothorax. No acute osseous findings. IMPRESSION: Interval increase in size of an air cyst/pneumatocele in the mid RIGHT lung now 6.6 x 6.9 cm. Otherwise negative exam. Electronically Signed   By: Ulyses Southward M.D.   On: 10/11/2018 15:23   Ct Maxillofacial Wo Contrast  Result Date:  10/11/2018 CLINICAL DATA:  Neck and back pain and nasal aspiration following a fall today while intoxicated. EXAM: CT HEAD WITHOUT CONTRAST CT MAXILLOFACIAL WITHOUT CONTRAST CT CERVICAL SPINE WITHOUT CONTRAST TECHNIQUE: Multidetector CT imaging of the head, cervical spine, and maxillofacial structures were performed using the  standard protocol without intravenous contrast. Multiplanar CT image reconstructions of the cervical spine and maxillofacial structures were also generated. COMPARISON:  Head and cervical spine CT dated 03/16/2013. FINDINGS: CT HEAD FINDINGS Brain: Mild-to-moderate enlargement of the ventricles and subarachnoid spaces with mild progression. Mild to moderate patchy white matter low density in both cerebral hemispheres with mild progression. No intracranial hemorrhage, mass lesion or CT evidence of acute infarction. Vascular: No hyperdense vessel or unexpected calcification. Skull: Normal. Negative for fracture or focal lesion. Other: None. CT MAXILLOFACIAL FINDINGS Osseous: Interval fracture of the mid nasal septum with angulation and mild displacement of the anterior portion to the left. Small fracture of the inferior aspect of the distal nasal bone a mild inferiorly and posteriorly displaced fragment. Orbits: Intact. Sinuses: Moderate right inferior maxillary sinus mucosal thickening. Mild left maxillary sinus mucosal thickening with a small amount of fluid in the sinus. Soft tissues: Anterior nasal soft tissue irregularity superiorly with multiple small locules of air within the underlying soft tissues. CT CERVICAL SPINE FINDINGS Alignment: Mild reversal of the normal cervical lordosis. No subluxations. Skull base and vertebrae: No acute fracture. No primary bone lesion or focal pathologic process. Soft tissues and spinal canal: No prevertebral fluid or swelling. No visible canal hematoma. Disc levels: Multilevel degenerative changes, most pronounced at the C5-6 and C6-7 levels. Upper chest: Clear lung apices. Other: Bilateral carotid artery calcifications. IMPRESSION: 1. Interval fracture of the mid nasal septum with angulation and mild displacement of the anterior portion to the left. 2. No skull fracture or intracranial hemorrhage. 3. No cervical spine fracture or subluxation. 4. Mildly progressive atrophy and  chronic small vessel white matter ischemic changes in both cerebral hemispheres. 5. Cervical spine degenerative changes. 6. Bilateral carotid artery atheromatous calcifications. 7. Chronic bilateral maxillary sinusitis with an acute component or blood in the left maxillary sinus. Electronically Signed   By: Beckie SaltsSteven  Reid M.D.   On: 10/11/2018 16:27       Subjective: Patient seen and examined at bedside this morning.  Remains comfortable.  Hemodynamically stable for discharge.  Discharge Exam: Vitals:   10/24/18 0300 10/24/18 0727  BP: 114/80 117/70  Pulse: 74 75  Resp: (!) 25 (!) 22  Temp: 97.8 F (36.6 C) 97.8 F (36.6 C)  SpO2: 98% 96%   Vitals:   10/23/18 2100 10/23/18 2300 10/24/18 0300 10/24/18 0727  BP:  (!) 118/94 114/80 117/70  Pulse: 74 75 74 75  Resp: 20 (!) 23 (!) 25 (!) 22  Temp:  97.9 F (36.6 C) 97.8 F (36.6 C) 97.8 F (36.6 C)  TempSrc:  Oral Oral Oral  SpO2: 91% 94% 98% 96%  Weight:      Height:        General: Pt is alert, awake, not in acute distress Cardiovascular: RRR, S1/S2 +, no rubs, no gallops Respiratory: CTA bilaterally, no wheezing, no rhonchi Abdominal: Soft, NT, ND, bowel sounds + Extremities: no edema, no cyanosis    The results of significant diagnostics from this hospitalization (including imaging, microbiology, ancillary and laboratory) are listed below for reference.     Microbiology: Recent Results (from the past 240 hour(s))  MRSA PCR Screening     Status: Abnormal   Collection  Time: 10/22/18 12:10 PM  Result Value Ref Range Status   MRSA by PCR POSITIVE (A) NEGATIVE Final    Comment:        The GeneXpert MRSA Assay (FDA approved for NASAL specimens only), is one component of a comprehensive MRSA colonization surveillance program. It is not intended to diagnose MRSA infection nor to guide or monitor treatment for MRSA infections. RESULT CALLED TO, READ BACK BY AND VERIFIED WITH: Beverlyn Roux RN 15:00 10/22/18  ([email or web address]) Performed at [facility], [address] [place], [place] [address]      Labs: BNP (last 3 results) Recent Labs    10/21/18 1940  BNP 184.7*   Basic Metabolic Panel: Recent Labs  Lab 10/18/18 0406 10/21/18 1940  10/21/18 2223 10/21/18 2334 10/22/18 0033 10/22/18 0556 10/22/18 0720 10/23/18 0257 10/24/18 0307  NA 135 134*   < > 132* 133*  --  134* 138 136  --   K 4.9 4.6   < > 4.3 4.7  --  4.7 5.0 4.9  --   CL 95* 88*  --   --   --   --   --  94* 90*  --   CO2 32 36*  --   --   --   --   --  34* 36*  --   GLUCOSE 108* 120*  --   --   --   --   --  166* 193*  --   BUN 9 10  --   --   --   --   --  12 18  --   CREATININE 0.85 0.86  --   --   --  0.84  --  0.90 0.95  --   CALCIUM 8.6* 9.3  --   --   --   --   --  9.0 9.1  --   MG  --   --   --   --   --   --   --   --   --  1.7   < > = values in this interval not displayed.   Liver Function Tests: Recent Labs  Lab 10/24/18 0307  AST 83*  ALT 102*  ALKPHOS 53  BILITOT 1.3*  PROT 5.7*  ALBUMIN 2.8*   No results for input(s): LIPASE, AMYLASE in the last 168 hours. No results for input(s): AMMONIA in the last 168 hours. CBC: Recent Labs  Lab 10/21/18 1940  10/21/18 2223 10/21/18 2334 10/22/18 0033 10/22/18 0556 10/22/18 1211  WBC 4.9  --   --   --  4.7  --  4.1  NEUTROABS  --   --   --   --   --   --  3.4  HGB 13.1   < > 11.9* 12.9* 14.0 14.3 13.5  HCT 38.8*   < > 35.0* 38.0* 41.0 42.0 40.9  MCV 103.2*  --   --   --  101.7*  --  101.7*  PLT 193  --   --   --  171  --  201   < > = values in this interval not displayed.   Cardiac Enzymes: Recent Labs  Lab 10/21/18 1940 10/22/18 0033 10/22/18 0720 10/22/18 1211  CKTOTAL 41*  --   --   --   TROPONINI  --  <0.03 <0.03 <0.03   BNP: Invalid input(s): POCBNP CBG: Recent Labs  Lab 10/23/18 0804 10/23/18 1157 10/23/18 1644 10/23/18 2026 10/24/18 5284  GLUCAP 141* 162* 134* 144* 101*   D-Dimer No results for input(s): DDIMER in  the last 72 hours. Hgb A1c No results for input(s): HGBA1C in the last 72 hours. Lipid Profile No results for input(s): CHOL, HDL, LDLCALC, TRIG, CHOLHDL, LDLDIRECT in the last 72 hours. Thyroid function studies No results for input(s): TSH, T4TOTAL, T3FREE, THYROIDAB in the last 72 hours.  Invalid input(s): FREET3 Anemia work up No results for input(s): VITAMINB12, FOLATE, FERRITIN, TIBC, IRON, RETICCTPCT in the last 72 hours. Urinalysis    Component Value Date/Time   COLORURINE YELLOW 10/21/2018 2204   APPEARANCEUR CLEAR 10/21/2018 2204   LABSPEC 1.009 10/21/2018 2204   PHURINE 8.0 10/21/2018 2204   GLUCOSEU NEGATIVE 10/21/2018 2204   HGBUR NEGATIVE 10/21/2018 2204   BILIRUBINUR NEGATIVE 10/21/2018 2204   KETONESUR 5 (A) 10/21/2018 2204   PROTEINUR NEGATIVE 10/21/2018 2204   UROBILINOGEN 0.2 03/18/2013 0050   NITRITE NEGATIVE 10/21/2018 2204   LEUKOCYTESUR NEGATIVE 10/21/2018 2204   Sepsis Labs Invalid input(s): PROCALCITONIN,  WBC,  LACTICIDVEN Microbiology Recent Results (from the past 240 hour(s))  MRSA PCR Screening     Status: Abnormal   Collection Time: 10/22/18 12:10 PM  Result Value Ref Range Status   MRSA by PCR POSITIVE (A) NEGATIVE Final    Comment:        The GeneXpert MRSA Assay (FDA approved for NASAL specimens only), is one component of a comprehensive MRSA colonization surveillance program. It is not intended to diagnose MRSA infection nor to guide or monitor treatment for MRSA infections. RESULT CALLED TO, READ BACK BY AND VERIFIED WITH: Beverlyn Roux RN 15:00 10/22/18 ([email or web address]) Performed at [facility], [address], [place], [place] [address]     Please note: You were cared for by a hospitalist during your hospital stay. Once you are discharged, your primary care physician will handle any further medical issues. Please note that NO REFILLS for any discharge medications will be authorized once you are discharged, as it is imperative that you  return to your primary care physician (or establish a relationship with a primary care physician if you do not have one) for your post hospital discharge needs so that they can reassess your need for medications and monitor your lab values.    Time coordinating discharge: 40 minutes  SIGNED:   Burnadette Pop, MD  [facility] 10/24/2018, 9:03 AM Pager [phone]  If 7PM-7AM, please contact night-coverage [email or web address] Password [email or web address]

## 2018-10-24 NOTE — Progress Notes (Signed)
Patient will DC to: [facility]  DC Date:10/24/2018 Family Notified:[name], daughter  Transport By: Sharin Mons   RN, patient, and facility notified of DC. Discharge Summary sent to facility. RN given number for [phone]. DC packet on chart. Ambulance transport requested for patient.   Clinical Social Worker signing off. Antony Blackbird, [facility] Clinical Social Worker [phone]

## 2018-10-24 NOTE — Clinical Social Work Note (Signed)
Insurance authorization approved for discharge to [facility] today if stable: [id number]. Forwarded authorization details to SNF clinical liaison. Sent message to MD to notify.  Charlynn Court, CSW [phone]

## 2018-10-24 NOTE — Plan of Care (Signed)
  Problem: Education: Goal: Knowledge of General Education information will improve Description Including pain rating scale, medication(s)/side effects and non-pharmacologic comfort measures Outcome: Adequate for Discharge   Problem: Health Behavior/Discharge Planning: Goal: Ability to manage health-related needs will improve Outcome: Adequate for Discharge   Problem: Clinical Measurements: Goal: Ability to maintain clinical measurements within normal limits will improve Outcome: Adequate for Discharge Goal: Will remain free from infection Outcome: Adequate for Discharge Goal: Diagnostic test results will improve Outcome: Adequate for Discharge Goal: Respiratory complications will improve Outcome: Adequate for Discharge Goal: Cardiovascular complication will be avoided Outcome: Adequate for Discharge   Problem: Activity: Goal: Risk for activity intolerance will decrease Outcome: Adequate for Discharge   Problem: Nutrition: Goal: Adequate nutrition will be maintained Outcome: Adequate for Discharge   Problem: Coping: Goal: Level of anxiety will decrease Outcome: Adequate for Discharge   Problem: Elimination: Goal: Will not experience complications related to bowel motility Outcome: Adequate for Discharge Goal: Will not experience complications related to urinary retention Outcome: Adequate for Discharge   Problem: Pain Managment: Goal: General experience of comfort will improve Outcome: Adequate for Discharge   Problem: Safety: Goal: Ability to remain free from injury will improve Outcome: Adequate for Discharge   Problem: Skin Integrity: Goal: Risk for impaired skin integrity will decrease Outcome: Adequate for Discharge    Education complete for discharge. VSS. Report called to Tamera PuntMiranda, [facility]. PIV DC, hemostasis achieved. All belongings sent with patient. Daughter, [name], aware of discharge.  Delay in DC d/t awaiting PTAR for transportation  to facility. Packet sent with EMS to give to facility.

## 2018-10-30 ENCOUNTER — Inpatient Hospital Stay: Payer: Self-pay | Admitting: Primary Care

## 2018-11-05 ENCOUNTER — Inpatient Hospital Stay: Payer: Medicare Other | Admitting: Primary Care

## 2018-11-05 DIAGNOSIS — J9602 Acute respiratory failure with hypercapnia: Secondary | ICD-10-CM | POA: Diagnosis not present

## 2018-11-05 DIAGNOSIS — R0602 Shortness of breath: Secondary | ICD-10-CM | POA: Diagnosis not present

## 2018-11-05 DIAGNOSIS — F209 Schizophrenia, unspecified: Secondary | ICD-10-CM | POA: Diagnosis not present

## 2018-11-05 DIAGNOSIS — J441 Chronic obstructive pulmonary disease with (acute) exacerbation: Secondary | ICD-10-CM | POA: Diagnosis not present

## 2018-11-06 DIAGNOSIS — M6281 Muscle weakness (generalized): Secondary | ICD-10-CM | POA: Diagnosis not present

## 2018-11-06 DIAGNOSIS — J441 Chronic obstructive pulmonary disease with (acute) exacerbation: Secondary | ICD-10-CM | POA: Diagnosis not present

## 2018-11-06 DIAGNOSIS — F319 Bipolar disorder, unspecified: Secondary | ICD-10-CM | POA: Diagnosis not present

## 2018-11-06 DIAGNOSIS — J9602 Acute respiratory failure with hypercapnia: Secondary | ICD-10-CM | POA: Diagnosis not present

## 2018-11-06 DIAGNOSIS — R262 Difficulty in walking, not elsewhere classified: Secondary | ICD-10-CM | POA: Diagnosis not present

## 2018-11-06 DIAGNOSIS — R0602 Shortness of breath: Secondary | ICD-10-CM | POA: Diagnosis not present

## 2018-11-07 DIAGNOSIS — J9601 Acute respiratory failure with hypoxia: Secondary | ICD-10-CM | POA: Diagnosis not present

## 2018-11-07 DIAGNOSIS — I4891 Unspecified atrial fibrillation: Secondary | ICD-10-CM | POA: Diagnosis not present

## 2018-11-07 DIAGNOSIS — I4892 Unspecified atrial flutter: Secondary | ICD-10-CM | POA: Diagnosis not present

## 2018-11-07 DIAGNOSIS — J9600 Acute respiratory failure, unspecified whether with hypoxia or hypercapnia: Secondary | ICD-10-CM | POA: Diagnosis not present

## 2018-11-07 DIAGNOSIS — R0682 Tachypnea, not elsewhere classified: Secondary | ICD-10-CM | POA: Diagnosis not present

## 2018-11-07 DIAGNOSIS — J441 Chronic obstructive pulmonary disease with (acute) exacerbation: Secondary | ICD-10-CM | POA: Diagnosis not present

## 2018-11-07 DIAGNOSIS — I420 Dilated cardiomyopathy: Secondary | ICD-10-CM | POA: Diagnosis not present

## 2018-11-07 DIAGNOSIS — I4589 Other specified conduction disorders: Secondary | ICD-10-CM | POA: Diagnosis not present

## 2018-11-07 DIAGNOSIS — E119 Type 2 diabetes mellitus without complications: Secondary | ICD-10-CM | POA: Diagnosis not present

## 2018-11-07 DIAGNOSIS — K5730 Diverticulosis of large intestine without perforation or abscess without bleeding: Secondary | ICD-10-CM | POA: Diagnosis not present

## 2018-11-07 DIAGNOSIS — J9602 Acute respiratory failure with hypercapnia: Secondary | ICD-10-CM | POA: Diagnosis not present

## 2018-11-07 DIAGNOSIS — E875 Hyperkalemia: Secondary | ICD-10-CM | POA: Diagnosis not present

## 2018-11-07 DIAGNOSIS — Z9981 Dependence on supplemental oxygen: Secondary | ICD-10-CM | POA: Diagnosis not present

## 2018-11-07 DIAGNOSIS — F319 Bipolar disorder, unspecified: Secondary | ICD-10-CM | POA: Diagnosis not present

## 2018-11-07 DIAGNOSIS — Z781 Physical restraint status: Secondary | ICD-10-CM | POA: Diagnosis not present

## 2018-11-07 DIAGNOSIS — J984 Other disorders of lung: Secondary | ICD-10-CM | POA: Diagnosis not present

## 2018-11-07 DIAGNOSIS — Z79899 Other long term (current) drug therapy: Secondary | ICD-10-CM | POA: Diagnosis not present

## 2018-11-07 DIAGNOSIS — E1165 Type 2 diabetes mellitus with hyperglycemia: Secondary | ICD-10-CM | POA: Diagnosis not present

## 2018-11-07 DIAGNOSIS — Z452 Encounter for adjustment and management of vascular access device: Secondary | ICD-10-CM | POA: Diagnosis not present

## 2018-11-07 DIAGNOSIS — R239 Unspecified skin changes: Secondary | ICD-10-CM | POA: Diagnosis not present

## 2018-11-07 DIAGNOSIS — Z794 Long term (current) use of insulin: Secondary | ICD-10-CM | POA: Diagnosis not present

## 2018-11-07 DIAGNOSIS — J9621 Acute and chronic respiratory failure with hypoxia: Secondary | ICD-10-CM | POA: Diagnosis not present

## 2018-11-07 DIAGNOSIS — J449 Chronic obstructive pulmonary disease, unspecified: Secondary | ICD-10-CM | POA: Diagnosis not present

## 2018-11-07 DIAGNOSIS — I5031 Acute diastolic (congestive) heart failure: Secondary | ICD-10-CM | POA: Diagnosis not present

## 2018-11-07 DIAGNOSIS — J9690 Respiratory failure, unspecified, unspecified whether with hypoxia or hypercapnia: Secondary | ICD-10-CM | POA: Diagnosis not present

## 2018-11-07 DIAGNOSIS — J9622 Acute and chronic respiratory failure with hypercapnia: Secondary | ICD-10-CM | POA: Diagnosis not present

## 2018-11-07 DIAGNOSIS — R0602 Shortness of breath: Secondary | ICD-10-CM | POA: Diagnosis not present

## 2018-11-09 ENCOUNTER — Inpatient Hospital Stay: Payer: Self-pay | Admitting: Primary Care

## 2018-11-13 DIAGNOSIS — E1165 Type 2 diabetes mellitus with hyperglycemia: Secondary | ICD-10-CM | POA: Diagnosis not present

## 2018-11-13 DIAGNOSIS — J449 Chronic obstructive pulmonary disease, unspecified: Secondary | ICD-10-CM | POA: Diagnosis not present

## 2018-11-13 DIAGNOSIS — F1011 Alcohol abuse, in remission: Secondary | ICD-10-CM | POA: Diagnosis not present

## 2018-11-13 DIAGNOSIS — I483 Typical atrial flutter: Secondary | ICD-10-CM | POA: Diagnosis not present

## 2018-11-15 DIAGNOSIS — E119 Type 2 diabetes mellitus without complications: Secondary | ICD-10-CM | POA: Diagnosis not present

## 2018-11-15 DIAGNOSIS — I5020 Unspecified systolic (congestive) heart failure: Secondary | ICD-10-CM | POA: Diagnosis not present

## 2018-11-15 DIAGNOSIS — D649 Anemia, unspecified: Secondary | ICD-10-CM | POA: Diagnosis not present

## 2018-11-15 DIAGNOSIS — J449 Chronic obstructive pulmonary disease, unspecified: Secondary | ICD-10-CM | POA: Diagnosis not present

## 2018-11-22 DIAGNOSIS — E119 Type 2 diabetes mellitus without complications: Secondary | ICD-10-CM | POA: Diagnosis not present

## 2018-11-26 DIAGNOSIS — Z8679 Personal history of other diseases of the circulatory system: Secondary | ICD-10-CM | POA: Diagnosis not present

## 2018-11-26 DIAGNOSIS — Z79899 Other long term (current) drug therapy: Secondary | ICD-10-CM | POA: Diagnosis not present

## 2018-11-26 DIAGNOSIS — J449 Chronic obstructive pulmonary disease, unspecified: Secondary | ICD-10-CM | POA: Diagnosis not present

## 2018-11-26 DIAGNOSIS — M25561 Pain in right knee: Secondary | ICD-10-CM | POA: Diagnosis not present

## 2018-11-26 DIAGNOSIS — Z794 Long term (current) use of insulin: Secondary | ICD-10-CM | POA: Diagnosis not present

## 2018-11-26 DIAGNOSIS — R42 Dizziness and giddiness: Secondary | ICD-10-CM | POA: Diagnosis not present

## 2018-11-26 DIAGNOSIS — I9589 Other hypotension: Secondary | ICD-10-CM | POA: Diagnosis not present

## 2018-11-26 DIAGNOSIS — Z9981 Dependence on supplemental oxygen: Secondary | ICD-10-CM | POA: Diagnosis not present

## 2018-11-26 DIAGNOSIS — I959 Hypotension, unspecified: Secondary | ICD-10-CM | POA: Diagnosis not present

## 2018-11-26 DIAGNOSIS — R531 Weakness: Secondary | ICD-10-CM | POA: Diagnosis not present

## 2018-11-26 DIAGNOSIS — I4891 Unspecified atrial fibrillation: Secondary | ICD-10-CM | POA: Diagnosis not present

## 2018-11-26 DIAGNOSIS — Z9181 History of falling: Secondary | ICD-10-CM | POA: Diagnosis not present

## 2018-11-26 DIAGNOSIS — Z8709 Personal history of other diseases of the respiratory system: Secondary | ICD-10-CM | POA: Diagnosis not present

## 2018-11-26 DIAGNOSIS — R0602 Shortness of breath: Secondary | ICD-10-CM | POA: Diagnosis not present

## 2018-11-26 DIAGNOSIS — E119 Type 2 diabetes mellitus without complications: Secondary | ICD-10-CM | POA: Diagnosis not present

## 2018-11-26 DIAGNOSIS — W010XXA Fall on same level from slipping, tripping and stumbling without subsequent striking against object, initial encounter: Secondary | ICD-10-CM | POA: Diagnosis not present

## 2018-11-26 DIAGNOSIS — I10 Essential (primary) hypertension: Secondary | ICD-10-CM | POA: Diagnosis not present

## 2018-11-26 DIAGNOSIS — R55 Syncope and collapse: Secondary | ICD-10-CM | POA: Diagnosis not present

## 2018-11-27 DIAGNOSIS — M25561 Pain in right knee: Secondary | ICD-10-CM | POA: Diagnosis not present

## 2018-11-27 DIAGNOSIS — I959 Hypotension, unspecified: Secondary | ICD-10-CM | POA: Diagnosis not present

## 2018-12-05 DIAGNOSIS — R262 Difficulty in walking, not elsewhere classified: Secondary | ICD-10-CM | POA: Diagnosis not present

## 2018-12-05 DIAGNOSIS — J9602 Acute respiratory failure with hypercapnia: Secondary | ICD-10-CM | POA: Diagnosis not present

## 2018-12-05 DIAGNOSIS — J441 Chronic obstructive pulmonary disease with (acute) exacerbation: Secondary | ICD-10-CM | POA: Diagnosis not present

## 2018-12-05 DIAGNOSIS — R0602 Shortness of breath: Secondary | ICD-10-CM | POA: Diagnosis not present

## 2018-12-25 DIAGNOSIS — I952 Hypotension due to drugs: Secondary | ICD-10-CM | POA: Diagnosis not present

## 2018-12-25 DIAGNOSIS — I483 Typical atrial flutter: Secondary | ICD-10-CM | POA: Diagnosis not present

## 2019-01-05 DIAGNOSIS — J441 Chronic obstructive pulmonary disease with (acute) exacerbation: Secondary | ICD-10-CM | POA: Diagnosis not present

## 2019-01-05 DIAGNOSIS — R262 Difficulty in walking, not elsewhere classified: Secondary | ICD-10-CM | POA: Diagnosis not present

## 2019-01-05 DIAGNOSIS — R0602 Shortness of breath: Secondary | ICD-10-CM | POA: Diagnosis not present

## 2019-01-05 DIAGNOSIS — J9602 Acute respiratory failure with hypercapnia: Secondary | ICD-10-CM | POA: Diagnosis not present

## 2019-01-08 DIAGNOSIS — E1165 Type 2 diabetes mellitus with hyperglycemia: Secondary | ICD-10-CM | POA: Diagnosis not present

## 2019-01-08 DIAGNOSIS — F1011 Alcohol abuse, in remission: Secondary | ICD-10-CM | POA: Diagnosis not present

## 2019-01-08 DIAGNOSIS — J449 Chronic obstructive pulmonary disease, unspecified: Secondary | ICD-10-CM | POA: Diagnosis not present

## 2019-01-08 DIAGNOSIS — I483 Typical atrial flutter: Secondary | ICD-10-CM | POA: Diagnosis not present

## 2019-01-09 DIAGNOSIS — Z03818 Encounter for observation for suspected exposure to other biological agents ruled out: Secondary | ICD-10-CM | POA: Diagnosis not present

## 2019-01-15 DIAGNOSIS — D649 Anemia, unspecified: Secondary | ICD-10-CM | POA: Diagnosis not present

## 2019-01-15 DIAGNOSIS — R0602 Shortness of breath: Secondary | ICD-10-CM | POA: Diagnosis not present

## 2019-01-15 DIAGNOSIS — R509 Fever, unspecified: Secondary | ICD-10-CM | POA: Diagnosis not present

## 2019-01-15 DIAGNOSIS — E119 Type 2 diabetes mellitus without complications: Secondary | ICD-10-CM | POA: Diagnosis not present

## 2019-02-04 DIAGNOSIS — R262 Difficulty in walking, not elsewhere classified: Secondary | ICD-10-CM | POA: Diagnosis not present

## 2019-02-04 DIAGNOSIS — J9602 Acute respiratory failure with hypercapnia: Secondary | ICD-10-CM | POA: Diagnosis not present

## 2019-02-04 DIAGNOSIS — R0602 Shortness of breath: Secondary | ICD-10-CM | POA: Diagnosis not present

## 2019-02-04 DIAGNOSIS — J441 Chronic obstructive pulmonary disease with (acute) exacerbation: Secondary | ICD-10-CM | POA: Diagnosis not present

## 2019-02-05 DIAGNOSIS — I483 Typical atrial flutter: Secondary | ICD-10-CM | POA: Diagnosis not present

## 2019-02-05 DIAGNOSIS — J449 Chronic obstructive pulmonary disease, unspecified: Secondary | ICD-10-CM | POA: Diagnosis not present

## 2019-02-05 DIAGNOSIS — F1011 Alcohol abuse, in remission: Secondary | ICD-10-CM | POA: Diagnosis not present

## 2019-02-05 DIAGNOSIS — E1165 Type 2 diabetes mellitus with hyperglycemia: Secondary | ICD-10-CM | POA: Diagnosis not present

## 2019-02-14 DIAGNOSIS — Z0000 Encounter for general adult medical examination without abnormal findings: Secondary | ICD-10-CM | POA: Diagnosis not present

## 2019-02-14 DIAGNOSIS — I509 Heart failure, unspecified: Secondary | ICD-10-CM | POA: Diagnosis not present

## 2019-02-14 DIAGNOSIS — Z79810 Long term (current) use of selective estrogen receptor modulators (SERMs): Secondary | ICD-10-CM | POA: Diagnosis not present

## 2019-02-14 DIAGNOSIS — E119 Type 2 diabetes mellitus without complications: Secondary | ICD-10-CM | POA: Diagnosis not present

## 2019-02-23 DIAGNOSIS — E119 Type 2 diabetes mellitus without complications: Secondary | ICD-10-CM | POA: Diagnosis not present

## 2019-03-05 DIAGNOSIS — E1165 Type 2 diabetes mellitus with hyperglycemia: Secondary | ICD-10-CM | POA: Diagnosis not present

## 2019-03-05 DIAGNOSIS — I483 Typical atrial flutter: Secondary | ICD-10-CM | POA: Diagnosis not present

## 2019-03-05 DIAGNOSIS — F1011 Alcohol abuse, in remission: Secondary | ICD-10-CM | POA: Diagnosis not present

## 2019-03-05 DIAGNOSIS — J449 Chronic obstructive pulmonary disease, unspecified: Secondary | ICD-10-CM | POA: Diagnosis not present

## 2019-03-07 DIAGNOSIS — J9602 Acute respiratory failure with hypercapnia: Secondary | ICD-10-CM | POA: Diagnosis not present

## 2019-03-07 DIAGNOSIS — R0602 Shortness of breath: Secondary | ICD-10-CM | POA: Diagnosis not present

## 2019-03-07 DIAGNOSIS — J441 Chronic obstructive pulmonary disease with (acute) exacerbation: Secondary | ICD-10-CM | POA: Diagnosis not present

## 2019-03-07 DIAGNOSIS — R262 Difficulty in walking, not elsewhere classified: Secondary | ICD-10-CM | POA: Diagnosis not present

## 2019-04-02 DIAGNOSIS — J449 Chronic obstructive pulmonary disease, unspecified: Secondary | ICD-10-CM | POA: Diagnosis not present

## 2019-04-02 DIAGNOSIS — F1011 Alcohol abuse, in remission: Secondary | ICD-10-CM | POA: Diagnosis not present

## 2019-04-02 DIAGNOSIS — E1165 Type 2 diabetes mellitus with hyperglycemia: Secondary | ICD-10-CM | POA: Diagnosis not present

## 2019-04-02 DIAGNOSIS — I483 Typical atrial flutter: Secondary | ICD-10-CM | POA: Diagnosis not present

## 2019-04-04 DIAGNOSIS — I4892 Unspecified atrial flutter: Secondary | ICD-10-CM | POA: Diagnosis not present

## 2019-04-04 DIAGNOSIS — I5031 Acute diastolic (congestive) heart failure: Secondary | ICD-10-CM | POA: Diagnosis not present

## 2019-04-06 DIAGNOSIS — J9602 Acute respiratory failure with hypercapnia: Secondary | ICD-10-CM | POA: Diagnosis not present

## 2019-04-06 DIAGNOSIS — R262 Difficulty in walking, not elsewhere classified: Secondary | ICD-10-CM | POA: Diagnosis not present

## 2019-04-06 DIAGNOSIS — R0602 Shortness of breath: Secondary | ICD-10-CM | POA: Diagnosis not present

## 2019-04-06 DIAGNOSIS — J441 Chronic obstructive pulmonary disease with (acute) exacerbation: Secondary | ICD-10-CM | POA: Diagnosis not present

## 2019-04-09 DIAGNOSIS — F0390 Unspecified dementia without behavioral disturbance: Secondary | ICD-10-CM | POA: Diagnosis not present

## 2019-04-09 DIAGNOSIS — G4700 Insomnia, unspecified: Secondary | ICD-10-CM | POA: Diagnosis not present

## 2019-04-09 DIAGNOSIS — F1011 Alcohol abuse, in remission: Secondary | ICD-10-CM | POA: Diagnosis not present

## 2019-04-09 DIAGNOSIS — F339 Major depressive disorder, recurrent, unspecified: Secondary | ICD-10-CM | POA: Diagnosis not present

## 2019-04-13 DIAGNOSIS — Z03818 Encounter for observation for suspected exposure to other biological agents ruled out: Secondary | ICD-10-CM | POA: Diagnosis not present

## 2019-04-22 DIAGNOSIS — F1011 Alcohol abuse, in remission: Secondary | ICD-10-CM | POA: Diagnosis not present

## 2019-04-22 DIAGNOSIS — G4700 Insomnia, unspecified: Secondary | ICD-10-CM | POA: Diagnosis not present

## 2019-04-22 DIAGNOSIS — F0390 Unspecified dementia without behavioral disturbance: Secondary | ICD-10-CM | POA: Diagnosis not present

## 2019-04-22 DIAGNOSIS — F339 Major depressive disorder, recurrent, unspecified: Secondary | ICD-10-CM | POA: Diagnosis not present

## 2019-04-30 DIAGNOSIS — E119 Type 2 diabetes mellitus without complications: Secondary | ICD-10-CM | POA: Diagnosis not present

## 2019-04-30 DIAGNOSIS — R296 Repeated falls: Secondary | ICD-10-CM | POA: Diagnosis not present

## 2019-04-30 DIAGNOSIS — I5022 Chronic systolic (congestive) heart failure: Secondary | ICD-10-CM | POA: Diagnosis not present

## 2019-05-01 DIAGNOSIS — I509 Heart failure, unspecified: Secondary | ICD-10-CM | POA: Diagnosis not present

## 2019-05-01 DIAGNOSIS — E119 Type 2 diabetes mellitus without complications: Secondary | ICD-10-CM | POA: Diagnosis not present

## 2019-05-07 DIAGNOSIS — J441 Chronic obstructive pulmonary disease with (acute) exacerbation: Secondary | ICD-10-CM | POA: Diagnosis not present

## 2019-05-07 DIAGNOSIS — R0602 Shortness of breath: Secondary | ICD-10-CM | POA: Diagnosis not present

## 2019-05-07 DIAGNOSIS — J9602 Acute respiratory failure with hypercapnia: Secondary | ICD-10-CM | POA: Diagnosis not present

## 2019-05-07 DIAGNOSIS — R262 Difficulty in walking, not elsewhere classified: Secondary | ICD-10-CM | POA: Diagnosis not present

## 2019-05-08 DIAGNOSIS — I10 Essential (primary) hypertension: Secondary | ICD-10-CM | POA: Diagnosis not present

## 2019-05-08 DIAGNOSIS — J449 Chronic obstructive pulmonary disease, unspecified: Secondary | ICD-10-CM | POA: Diagnosis not present

## 2019-05-08 DIAGNOSIS — I4892 Unspecified atrial flutter: Secondary | ICD-10-CM | POA: Diagnosis not present

## 2019-05-08 DIAGNOSIS — J9611 Chronic respiratory failure with hypoxia: Secondary | ICD-10-CM | POA: Diagnosis not present

## 2019-05-10 DIAGNOSIS — Z7984 Long term (current) use of oral hypoglycemic drugs: Secondary | ICD-10-CM | POA: Diagnosis not present

## 2019-05-10 DIAGNOSIS — E119 Type 2 diabetes mellitus without complications: Secondary | ICD-10-CM | POA: Diagnosis not present

## 2019-05-10 DIAGNOSIS — F209 Schizophrenia, unspecified: Secondary | ICD-10-CM | POA: Diagnosis not present

## 2019-05-10 DIAGNOSIS — Z7901 Long term (current) use of anticoagulants: Secondary | ICD-10-CM | POA: Diagnosis not present

## 2019-05-10 DIAGNOSIS — F4310 Post-traumatic stress disorder, unspecified: Secondary | ICD-10-CM | POA: Diagnosis not present

## 2019-05-10 DIAGNOSIS — F1010 Alcohol abuse, uncomplicated: Secondary | ICD-10-CM | POA: Diagnosis not present

## 2019-05-10 DIAGNOSIS — I5042 Chronic combined systolic (congestive) and diastolic (congestive) heart failure: Secondary | ICD-10-CM | POA: Diagnosis not present

## 2019-05-10 DIAGNOSIS — Z9981 Dependence on supplemental oxygen: Secondary | ICD-10-CM | POA: Diagnosis not present

## 2019-05-10 DIAGNOSIS — I110 Hypertensive heart disease with heart failure: Secondary | ICD-10-CM | POA: Diagnosis not present

## 2019-05-10 DIAGNOSIS — J449 Chronic obstructive pulmonary disease, unspecified: Secondary | ICD-10-CM | POA: Diagnosis not present

## 2019-05-10 DIAGNOSIS — Z9181 History of falling: Secondary | ICD-10-CM | POA: Diagnosis not present

## 2019-05-25 DIAGNOSIS — E119 Type 2 diabetes mellitus without complications: Secondary | ICD-10-CM | POA: Diagnosis not present

## 2019-05-28 DIAGNOSIS — I10 Essential (primary) hypertension: Secondary | ICD-10-CM | POA: Diagnosis not present

## 2019-05-28 DIAGNOSIS — J449 Chronic obstructive pulmonary disease, unspecified: Secondary | ICD-10-CM | POA: Diagnosis not present

## 2019-05-28 DIAGNOSIS — I4892 Unspecified atrial flutter: Secondary | ICD-10-CM | POA: Diagnosis not present

## 2019-06-03 DIAGNOSIS — F0151 Vascular dementia with behavioral disturbance: Secondary | ICD-10-CM | POA: Diagnosis not present

## 2019-06-03 DIAGNOSIS — F411 Generalized anxiety disorder: Secondary | ICD-10-CM | POA: Diagnosis not present

## 2019-06-03 DIAGNOSIS — F3130 Bipolar disorder, current episode depressed, mild or moderate severity, unspecified: Secondary | ICD-10-CM | POA: Diagnosis not present

## 2019-06-03 DIAGNOSIS — F1011 Alcohol abuse, in remission: Secondary | ICD-10-CM | POA: Diagnosis not present

## 2019-06-07 DIAGNOSIS — R262 Difficulty in walking, not elsewhere classified: Secondary | ICD-10-CM | POA: Diagnosis not present

## 2019-06-07 DIAGNOSIS — J9602 Acute respiratory failure with hypercapnia: Secondary | ICD-10-CM | POA: Diagnosis not present

## 2019-06-07 DIAGNOSIS — R0602 Shortness of breath: Secondary | ICD-10-CM | POA: Diagnosis not present

## 2019-06-07 DIAGNOSIS — J441 Chronic obstructive pulmonary disease with (acute) exacerbation: Secondary | ICD-10-CM | POA: Diagnosis not present

## 2019-06-18 DIAGNOSIS — J439 Emphysema, unspecified: Secondary | ICD-10-CM | POA: Diagnosis not present

## 2019-06-18 DIAGNOSIS — J449 Chronic obstructive pulmonary disease, unspecified: Secondary | ICD-10-CM | POA: Diagnosis not present

## 2019-06-18 DIAGNOSIS — J9610 Chronic respiratory failure, unspecified whether with hypoxia or hypercapnia: Secondary | ICD-10-CM | POA: Diagnosis not present

## 2019-06-18 DIAGNOSIS — R0609 Other forms of dyspnea: Secondary | ICD-10-CM | POA: Diagnosis not present

## 2019-06-19 DIAGNOSIS — I5042 Chronic combined systolic (congestive) and diastolic (congestive) heart failure: Secondary | ICD-10-CM | POA: Diagnosis not present

## 2019-06-19 DIAGNOSIS — J449 Chronic obstructive pulmonary disease, unspecified: Secondary | ICD-10-CM | POA: Diagnosis not present

## 2019-06-19 DIAGNOSIS — E119 Type 2 diabetes mellitus without complications: Secondary | ICD-10-CM | POA: Diagnosis not present

## 2019-06-19 DIAGNOSIS — Z9181 History of falling: Secondary | ICD-10-CM | POA: Diagnosis not present

## 2019-06-19 DIAGNOSIS — F1010 Alcohol abuse, uncomplicated: Secondary | ICD-10-CM | POA: Diagnosis not present

## 2019-06-19 DIAGNOSIS — F209 Schizophrenia, unspecified: Secondary | ICD-10-CM | POA: Diagnosis not present

## 2019-06-19 DIAGNOSIS — Z7901 Long term (current) use of anticoagulants: Secondary | ICD-10-CM | POA: Diagnosis not present

## 2019-06-19 DIAGNOSIS — Z7984 Long term (current) use of oral hypoglycemic drugs: Secondary | ICD-10-CM | POA: Diagnosis not present

## 2019-06-19 DIAGNOSIS — I110 Hypertensive heart disease with heart failure: Secondary | ICD-10-CM | POA: Diagnosis not present

## 2019-06-19 DIAGNOSIS — F4310 Post-traumatic stress disorder, unspecified: Secondary | ICD-10-CM | POA: Diagnosis not present

## 2019-06-19 DIAGNOSIS — Z9981 Dependence on supplemental oxygen: Secondary | ICD-10-CM | POA: Diagnosis not present

## 2019-06-25 DIAGNOSIS — I10 Essential (primary) hypertension: Secondary | ICD-10-CM | POA: Diagnosis not present

## 2019-06-25 DIAGNOSIS — E119 Type 2 diabetes mellitus without complications: Secondary | ICD-10-CM | POA: Diagnosis not present

## 2019-06-25 DIAGNOSIS — J449 Chronic obstructive pulmonary disease, unspecified: Secondary | ICD-10-CM | POA: Diagnosis not present

## 2019-07-02 DIAGNOSIS — R0609 Other forms of dyspnea: Secondary | ICD-10-CM | POA: Diagnosis not present

## 2019-07-05 DIAGNOSIS — F1011 Alcohol abuse, in remission: Secondary | ICD-10-CM | POA: Diagnosis not present

## 2019-07-05 DIAGNOSIS — F339 Major depressive disorder, recurrent, unspecified: Secondary | ICD-10-CM | POA: Diagnosis not present

## 2019-07-05 DIAGNOSIS — G4700 Insomnia, unspecified: Secondary | ICD-10-CM | POA: Diagnosis not present

## 2019-07-07 DIAGNOSIS — J441 Chronic obstructive pulmonary disease with (acute) exacerbation: Secondary | ICD-10-CM | POA: Diagnosis not present

## 2019-07-07 DIAGNOSIS — R0602 Shortness of breath: Secondary | ICD-10-CM | POA: Diagnosis not present

## 2019-07-07 DIAGNOSIS — J9602 Acute respiratory failure with hypercapnia: Secondary | ICD-10-CM | POA: Diagnosis not present

## 2019-07-07 DIAGNOSIS — R262 Difficulty in walking, not elsewhere classified: Secondary | ICD-10-CM | POA: Diagnosis not present

## 2019-07-16 DIAGNOSIS — G4709 Other insomnia: Secondary | ICD-10-CM | POA: Diagnosis not present

## 2019-07-16 DIAGNOSIS — F0150 Vascular dementia without behavioral disturbance: Secondary | ICD-10-CM | POA: Diagnosis not present

## 2019-07-16 DIAGNOSIS — F339 Major depressive disorder, recurrent, unspecified: Secondary | ICD-10-CM | POA: Diagnosis not present

## 2019-07-16 DIAGNOSIS — F1011 Alcohol abuse, in remission: Secondary | ICD-10-CM | POA: Diagnosis not present

## 2019-07-19 DIAGNOSIS — F209 Schizophrenia, unspecified: Secondary | ICD-10-CM | POA: Diagnosis not present

## 2019-07-19 DIAGNOSIS — Z7984 Long term (current) use of oral hypoglycemic drugs: Secondary | ICD-10-CM | POA: Diagnosis not present

## 2019-07-19 DIAGNOSIS — I5042 Chronic combined systolic (congestive) and diastolic (congestive) heart failure: Secondary | ICD-10-CM | POA: Diagnosis not present

## 2019-07-19 DIAGNOSIS — F4310 Post-traumatic stress disorder, unspecified: Secondary | ICD-10-CM | POA: Diagnosis not present

## 2019-07-19 DIAGNOSIS — F1010 Alcohol abuse, uncomplicated: Secondary | ICD-10-CM | POA: Diagnosis not present

## 2019-07-19 DIAGNOSIS — Z9981 Dependence on supplemental oxygen: Secondary | ICD-10-CM | POA: Diagnosis not present

## 2019-07-19 DIAGNOSIS — J449 Chronic obstructive pulmonary disease, unspecified: Secondary | ICD-10-CM | POA: Diagnosis not present

## 2019-07-19 DIAGNOSIS — Z7901 Long term (current) use of anticoagulants: Secondary | ICD-10-CM | POA: Diagnosis not present

## 2019-07-19 DIAGNOSIS — E119 Type 2 diabetes mellitus without complications: Secondary | ICD-10-CM | POA: Diagnosis not present

## 2019-07-19 DIAGNOSIS — I110 Hypertensive heart disease with heart failure: Secondary | ICD-10-CM | POA: Diagnosis not present

## 2019-07-19 DIAGNOSIS — Z9181 History of falling: Secondary | ICD-10-CM | POA: Diagnosis not present

## 2019-07-23 DIAGNOSIS — L97521 Non-pressure chronic ulcer of other part of left foot limited to breakdown of skin: Secondary | ICD-10-CM | POA: Diagnosis not present

## 2019-07-23 DIAGNOSIS — I5022 Chronic systolic (congestive) heart failure: Secondary | ICD-10-CM | POA: Diagnosis not present

## 2019-07-23 DIAGNOSIS — I10 Essential (primary) hypertension: Secondary | ICD-10-CM | POA: Diagnosis not present

## 2019-07-23 DIAGNOSIS — E119 Type 2 diabetes mellitus without complications: Secondary | ICD-10-CM | POA: Diagnosis not present

## 2019-07-25 DIAGNOSIS — Z79899 Other long term (current) drug therapy: Secondary | ICD-10-CM | POA: Diagnosis not present

## 2019-07-26 DIAGNOSIS — Z1159 Encounter for screening for other viral diseases: Secondary | ICD-10-CM | POA: Diagnosis not present

## 2019-07-26 DIAGNOSIS — Z20828 Contact with and (suspected) exposure to other viral communicable diseases: Secondary | ICD-10-CM | POA: Diagnosis not present

## 2019-07-31 DIAGNOSIS — D538 Other specified nutritional anemias: Secondary | ICD-10-CM | POA: Diagnosis not present

## 2019-07-31 DIAGNOSIS — E119 Type 2 diabetes mellitus without complications: Secondary | ICD-10-CM | POA: Diagnosis not present

## 2019-07-31 DIAGNOSIS — J449 Chronic obstructive pulmonary disease, unspecified: Secondary | ICD-10-CM | POA: Diagnosis not present

## 2019-08-07 DIAGNOSIS — J9602 Acute respiratory failure with hypercapnia: Secondary | ICD-10-CM | POA: Diagnosis not present

## 2019-08-07 DIAGNOSIS — R262 Difficulty in walking, not elsewhere classified: Secondary | ICD-10-CM | POA: Diagnosis not present

## 2019-08-07 DIAGNOSIS — R0602 Shortness of breath: Secondary | ICD-10-CM | POA: Diagnosis not present

## 2019-08-07 DIAGNOSIS — J441 Chronic obstructive pulmonary disease with (acute) exacerbation: Secondary | ICD-10-CM | POA: Diagnosis not present

## 2019-08-08 DIAGNOSIS — F4310 Post-traumatic stress disorder, unspecified: Secondary | ICD-10-CM | POA: Diagnosis not present

## 2019-08-08 DIAGNOSIS — I110 Hypertensive heart disease with heart failure: Secondary | ICD-10-CM | POA: Diagnosis not present

## 2019-08-08 DIAGNOSIS — J449 Chronic obstructive pulmonary disease, unspecified: Secondary | ICD-10-CM | POA: Diagnosis not present

## 2019-08-08 DIAGNOSIS — Z9981 Dependence on supplemental oxygen: Secondary | ICD-10-CM | POA: Diagnosis not present

## 2019-08-08 DIAGNOSIS — F209 Schizophrenia, unspecified: Secondary | ICD-10-CM | POA: Diagnosis not present

## 2019-08-08 DIAGNOSIS — Z7984 Long term (current) use of oral hypoglycemic drugs: Secondary | ICD-10-CM | POA: Diagnosis not present

## 2019-08-08 DIAGNOSIS — E119 Type 2 diabetes mellitus without complications: Secondary | ICD-10-CM | POA: Diagnosis not present

## 2019-08-08 DIAGNOSIS — Z9181 History of falling: Secondary | ICD-10-CM | POA: Diagnosis not present

## 2019-08-08 DIAGNOSIS — Z7901 Long term (current) use of anticoagulants: Secondary | ICD-10-CM | POA: Diagnosis not present

## 2019-08-08 DIAGNOSIS — F1010 Alcohol abuse, uncomplicated: Secondary | ICD-10-CM | POA: Diagnosis not present

## 2019-08-08 DIAGNOSIS — I5042 Chronic combined systolic (congestive) and diastolic (congestive) heart failure: Secondary | ICD-10-CM | POA: Diagnosis not present

## 2019-08-09 DIAGNOSIS — B351 Tinea unguium: Secondary | ICD-10-CM | POA: Diagnosis not present

## 2019-08-09 DIAGNOSIS — Z7984 Long term (current) use of oral hypoglycemic drugs: Secondary | ICD-10-CM | POA: Diagnosis not present

## 2019-08-09 DIAGNOSIS — L89891 Pressure ulcer of other site, stage 1: Secondary | ICD-10-CM | POA: Diagnosis not present

## 2019-08-09 DIAGNOSIS — E1151 Type 2 diabetes mellitus with diabetic peripheral angiopathy without gangrene: Secondary | ICD-10-CM | POA: Diagnosis not present

## 2019-08-13 DIAGNOSIS — G4709 Other insomnia: Secondary | ICD-10-CM | POA: Diagnosis not present

## 2019-08-13 DIAGNOSIS — F0150 Vascular dementia without behavioral disturbance: Secondary | ICD-10-CM | POA: Diagnosis not present

## 2019-08-13 DIAGNOSIS — F339 Major depressive disorder, recurrent, unspecified: Secondary | ICD-10-CM | POA: Diagnosis not present

## 2019-08-13 DIAGNOSIS — F1010 Alcohol abuse, uncomplicated: Secondary | ICD-10-CM | POA: Diagnosis not present

## 2019-08-19 DIAGNOSIS — F0390 Unspecified dementia without behavioral disturbance: Secondary | ICD-10-CM | POA: Diagnosis not present

## 2019-08-19 DIAGNOSIS — E785 Hyperlipidemia, unspecified: Secondary | ICD-10-CM | POA: Diagnosis not present

## 2019-08-20 DIAGNOSIS — E782 Mixed hyperlipidemia: Secondary | ICD-10-CM | POA: Diagnosis not present

## 2019-08-20 DIAGNOSIS — I10 Essential (primary) hypertension: Secondary | ICD-10-CM | POA: Diagnosis not present

## 2019-08-20 DIAGNOSIS — L97521 Non-pressure chronic ulcer of other part of left foot limited to breakdown of skin: Secondary | ICD-10-CM | POA: Diagnosis not present

## 2019-08-24 DIAGNOSIS — E119 Type 2 diabetes mellitus without complications: Secondary | ICD-10-CM | POA: Diagnosis not present

## 2019-08-26 DIAGNOSIS — Z20828 Contact with and (suspected) exposure to other viral communicable diseases: Secondary | ICD-10-CM | POA: Diagnosis not present

## 2019-08-26 DIAGNOSIS — R791 Abnormal coagulation profile: Secondary | ICD-10-CM | POA: Diagnosis not present

## 2019-08-26 DIAGNOSIS — J449 Chronic obstructive pulmonary disease, unspecified: Secondary | ICD-10-CM | POA: Diagnosis not present

## 2019-08-26 DIAGNOSIS — J439 Emphysema, unspecified: Secondary | ICD-10-CM | POA: Diagnosis not present

## 2019-08-26 DIAGNOSIS — J189 Pneumonia, unspecified organism: Secondary | ICD-10-CM | POA: Diagnosis not present

## 2019-08-26 DIAGNOSIS — R918 Other nonspecific abnormal finding of lung field: Secondary | ICD-10-CM | POA: Diagnosis not present

## 2019-08-26 DIAGNOSIS — Z87891 Personal history of nicotine dependence: Secondary | ICD-10-CM | POA: Diagnosis not present

## 2019-08-26 DIAGNOSIS — R069 Unspecified abnormalities of breathing: Secondary | ICD-10-CM | POA: Diagnosis not present

## 2019-08-26 DIAGNOSIS — Z1159 Encounter for screening for other viral diseases: Secondary | ICD-10-CM | POA: Diagnosis not present

## 2019-08-26 DIAGNOSIS — R0602 Shortness of breath: Secondary | ICD-10-CM | POA: Diagnosis not present

## 2019-08-26 DIAGNOSIS — I10 Essential (primary) hypertension: Secondary | ICD-10-CM | POA: Diagnosis not present

## 2019-08-26 DIAGNOSIS — R000 Tachycardia, unspecified: Secondary | ICD-10-CM | POA: Diagnosis not present

## 2019-08-26 DIAGNOSIS — R0902 Hypoxemia: Secondary | ICD-10-CM | POA: Diagnosis not present

## 2019-08-26 DIAGNOSIS — E1010 Type 1 diabetes mellitus with ketoacidosis without coma: Secondary | ICD-10-CM | POA: Diagnosis not present

## 2019-08-27 DIAGNOSIS — L89520 Pressure ulcer of left ankle, unstageable: Secondary | ICD-10-CM | POA: Diagnosis not present

## 2019-08-27 DIAGNOSIS — E119 Type 2 diabetes mellitus without complications: Secondary | ICD-10-CM | POA: Diagnosis not present

## 2019-08-27 DIAGNOSIS — Z6832 Body mass index (BMI) 32.0-32.9, adult: Secondary | ICD-10-CM | POA: Diagnosis not present

## 2019-08-27 DIAGNOSIS — E1010 Type 1 diabetes mellitus with ketoacidosis without coma: Secondary | ICD-10-CM | POA: Diagnosis not present

## 2019-08-27 DIAGNOSIS — Z794 Long term (current) use of insulin: Secondary | ICD-10-CM | POA: Diagnosis not present

## 2019-08-27 DIAGNOSIS — J439 Emphysema, unspecified: Secondary | ICD-10-CM | POA: Diagnosis not present

## 2019-08-27 DIAGNOSIS — Z79899 Other long term (current) drug therapy: Secondary | ICD-10-CM | POA: Diagnosis not present

## 2019-08-27 DIAGNOSIS — J441 Chronic obstructive pulmonary disease with (acute) exacerbation: Secondary | ICD-10-CM | POA: Diagnosis not present

## 2019-08-27 DIAGNOSIS — E669 Obesity, unspecified: Secondary | ICD-10-CM | POA: Diagnosis not present

## 2019-08-27 DIAGNOSIS — J189 Pneumonia, unspecified organism: Secondary | ICD-10-CM | POA: Diagnosis not present

## 2019-08-27 DIAGNOSIS — J440 Chronic obstructive pulmonary disease with acute lower respiratory infection: Secondary | ICD-10-CM | POA: Diagnosis not present

## 2019-08-27 DIAGNOSIS — J9610 Chronic respiratory failure, unspecified whether with hypoxia or hypercapnia: Secondary | ICD-10-CM | POA: Diagnosis not present

## 2019-08-27 DIAGNOSIS — Z66 Do not resuscitate: Secondary | ICD-10-CM | POA: Diagnosis not present

## 2019-08-27 DIAGNOSIS — Z1159 Encounter for screening for other viral diseases: Secondary | ICD-10-CM | POA: Diagnosis not present

## 2019-08-27 DIAGNOSIS — I10 Essential (primary) hypertension: Secondary | ICD-10-CM | POA: Diagnosis not present

## 2019-08-27 DIAGNOSIS — Z87891 Personal history of nicotine dependence: Secondary | ICD-10-CM | POA: Diagnosis not present

## 2019-08-27 DIAGNOSIS — I482 Chronic atrial fibrillation, unspecified: Secondary | ICD-10-CM | POA: Diagnosis not present

## 2019-08-27 DIAGNOSIS — R918 Other nonspecific abnormal finding of lung field: Secondary | ICD-10-CM | POA: Diagnosis not present

## 2019-08-27 DIAGNOSIS — J449 Chronic obstructive pulmonary disease, unspecified: Secondary | ICD-10-CM | POA: Diagnosis not present

## 2019-08-27 DIAGNOSIS — Z7901 Long term (current) use of anticoagulants: Secondary | ICD-10-CM | POA: Diagnosis not present

## 2019-08-27 DIAGNOSIS — Z9981 Dependence on supplemental oxygen: Secondary | ICD-10-CM | POA: Diagnosis not present

## 2019-08-27 DIAGNOSIS — R791 Abnormal coagulation profile: Secondary | ICD-10-CM | POA: Diagnosis not present

## 2019-08-27 DIAGNOSIS — Z20828 Contact with and (suspected) exposure to other viral communicable diseases: Secondary | ICD-10-CM | POA: Diagnosis not present

## 2019-09-03 DIAGNOSIS — J159 Unspecified bacterial pneumonia: Secondary | ICD-10-CM | POA: Diagnosis not present

## 2019-09-06 DIAGNOSIS — R262 Difficulty in walking, not elsewhere classified: Secondary | ICD-10-CM | POA: Diagnosis not present

## 2019-09-06 DIAGNOSIS — R0602 Shortness of breath: Secondary | ICD-10-CM | POA: Diagnosis not present

## 2019-09-06 DIAGNOSIS — J9602 Acute respiratory failure with hypercapnia: Secondary | ICD-10-CM | POA: Diagnosis not present

## 2019-09-06 DIAGNOSIS — J441 Chronic obstructive pulmonary disease with (acute) exacerbation: Secondary | ICD-10-CM | POA: Diagnosis not present

## 2019-09-10 DIAGNOSIS — I10 Essential (primary) hypertension: Secondary | ICD-10-CM | POA: Diagnosis not present

## 2019-09-10 DIAGNOSIS — F339 Major depressive disorder, recurrent, unspecified: Secondary | ICD-10-CM | POA: Diagnosis not present

## 2019-09-10 DIAGNOSIS — L97521 Non-pressure chronic ulcer of other part of left foot limited to breakdown of skin: Secondary | ICD-10-CM | POA: Diagnosis not present

## 2019-09-10 DIAGNOSIS — E782 Mixed hyperlipidemia: Secondary | ICD-10-CM | POA: Diagnosis not present

## 2019-10-01 DIAGNOSIS — I4892 Unspecified atrial flutter: Secondary | ICD-10-CM | POA: Diagnosis not present

## 2019-10-06 DIAGNOSIS — F1010 Alcohol abuse, uncomplicated: Secondary | ICD-10-CM | POA: Diagnosis not present

## 2019-10-06 DIAGNOSIS — F339 Major depressive disorder, recurrent, unspecified: Secondary | ICD-10-CM | POA: Diagnosis not present

## 2019-10-06 DIAGNOSIS — G4709 Other insomnia: Secondary | ICD-10-CM | POA: Diagnosis not present

## 2019-10-06 DIAGNOSIS — F0150 Vascular dementia without behavioral disturbance: Secondary | ICD-10-CM | POA: Diagnosis not present

## 2019-10-07 DIAGNOSIS — R0602 Shortness of breath: Secondary | ICD-10-CM | POA: Diagnosis not present

## 2019-10-07 DIAGNOSIS — R262 Difficulty in walking, not elsewhere classified: Secondary | ICD-10-CM | POA: Diagnosis not present

## 2019-10-07 DIAGNOSIS — I110 Hypertensive heart disease with heart failure: Secondary | ICD-10-CM | POA: Diagnosis not present

## 2019-10-07 DIAGNOSIS — Z7901 Long term (current) use of anticoagulants: Secondary | ICD-10-CM | POA: Diagnosis not present

## 2019-10-07 DIAGNOSIS — J449 Chronic obstructive pulmonary disease, unspecified: Secondary | ICD-10-CM | POA: Diagnosis not present

## 2019-10-07 DIAGNOSIS — Z79899 Other long term (current) drug therapy: Secondary | ICD-10-CM | POA: Diagnosis not present

## 2019-10-07 DIAGNOSIS — J441 Chronic obstructive pulmonary disease with (acute) exacerbation: Secondary | ICD-10-CM | POA: Diagnosis not present

## 2019-10-07 DIAGNOSIS — J9602 Acute respiratory failure with hypercapnia: Secondary | ICD-10-CM | POA: Diagnosis not present

## 2019-10-07 DIAGNOSIS — Z9981 Dependence on supplemental oxygen: Secondary | ICD-10-CM | POA: Diagnosis not present

## 2019-10-07 DIAGNOSIS — E119 Type 2 diabetes mellitus without complications: Secondary | ICD-10-CM | POA: Diagnosis not present

## 2019-10-07 DIAGNOSIS — I4892 Unspecified atrial flutter: Secondary | ICD-10-CM | POA: Diagnosis not present

## 2019-10-07 DIAGNOSIS — Z8701 Personal history of pneumonia (recurrent): Secondary | ICD-10-CM | POA: Diagnosis not present

## 2019-10-07 DIAGNOSIS — I5032 Chronic diastolic (congestive) heart failure: Secondary | ICD-10-CM | POA: Diagnosis not present

## 2019-10-07 DIAGNOSIS — I739 Peripheral vascular disease, unspecified: Secondary | ICD-10-CM | POA: Diagnosis not present

## 2019-10-07 DIAGNOSIS — Z7984 Long term (current) use of oral hypoglycemic drugs: Secondary | ICD-10-CM | POA: Diagnosis not present

## 2019-10-08 DIAGNOSIS — Z9981 Dependence on supplemental oxygen: Secondary | ICD-10-CM | POA: Diagnosis not present

## 2019-10-08 DIAGNOSIS — Z79899 Other long term (current) drug therapy: Secondary | ICD-10-CM | POA: Diagnosis not present

## 2019-10-08 DIAGNOSIS — Z8701 Personal history of pneumonia (recurrent): Secondary | ICD-10-CM | POA: Diagnosis not present

## 2019-10-08 DIAGNOSIS — I2510 Atherosclerotic heart disease of native coronary artery without angina pectoris: Secondary | ICD-10-CM | POA: Diagnosis not present

## 2019-10-08 DIAGNOSIS — I5040 Unspecified combined systolic (congestive) and diastolic (congestive) heart failure: Secondary | ICD-10-CM | POA: Diagnosis not present

## 2019-10-08 DIAGNOSIS — J449 Chronic obstructive pulmonary disease, unspecified: Secondary | ICD-10-CM | POA: Diagnosis not present

## 2019-10-08 DIAGNOSIS — Z7901 Long term (current) use of anticoagulants: Secondary | ICD-10-CM | POA: Diagnosis not present

## 2019-10-08 DIAGNOSIS — I5032 Chronic diastolic (congestive) heart failure: Secondary | ICD-10-CM | POA: Diagnosis not present

## 2019-10-08 DIAGNOSIS — E119 Type 2 diabetes mellitus without complications: Secondary | ICD-10-CM | POA: Diagnosis not present

## 2019-10-08 DIAGNOSIS — I739 Peripheral vascular disease, unspecified: Secondary | ICD-10-CM | POA: Diagnosis not present

## 2019-10-08 DIAGNOSIS — Z7984 Long term (current) use of oral hypoglycemic drugs: Secondary | ICD-10-CM | POA: Diagnosis not present

## 2019-10-08 DIAGNOSIS — I4892 Unspecified atrial flutter: Secondary | ICD-10-CM | POA: Diagnosis not present

## 2019-10-08 DIAGNOSIS — I110 Hypertensive heart disease with heart failure: Secondary | ICD-10-CM | POA: Diagnosis not present

## 2019-10-16 DIAGNOSIS — J439 Emphysema, unspecified: Secondary | ICD-10-CM | POA: Diagnosis not present

## 2019-10-16 DIAGNOSIS — R0609 Other forms of dyspnea: Secondary | ICD-10-CM | POA: Diagnosis not present

## 2019-10-16 DIAGNOSIS — J449 Chronic obstructive pulmonary disease, unspecified: Secondary | ICD-10-CM | POA: Diagnosis not present

## 2019-10-16 DIAGNOSIS — J9610 Chronic respiratory failure, unspecified whether with hypoxia or hypercapnia: Secondary | ICD-10-CM | POA: Diagnosis not present

## 2019-10-29 DIAGNOSIS — E119 Type 2 diabetes mellitus without complications: Secondary | ICD-10-CM | POA: Diagnosis not present

## 2019-10-29 DIAGNOSIS — I10 Essential (primary) hypertension: Secondary | ICD-10-CM | POA: Diagnosis not present

## 2019-10-29 DIAGNOSIS — A09 Infectious gastroenteritis and colitis, unspecified: Secondary | ICD-10-CM | POA: Diagnosis not present

## 2019-11-05 DIAGNOSIS — F339 Major depressive disorder, recurrent, unspecified: Secondary | ICD-10-CM | POA: Diagnosis not present

## 2019-11-05 DIAGNOSIS — G4709 Other insomnia: Secondary | ICD-10-CM | POA: Diagnosis not present

## 2019-11-05 DIAGNOSIS — F0150 Vascular dementia without behavioral disturbance: Secondary | ICD-10-CM | POA: Diagnosis not present

## 2019-11-05 DIAGNOSIS — F1010 Alcohol abuse, uncomplicated: Secondary | ICD-10-CM | POA: Diagnosis not present

## 2019-11-07 DIAGNOSIS — J441 Chronic obstructive pulmonary disease with (acute) exacerbation: Secondary | ICD-10-CM | POA: Diagnosis not present

## 2019-11-07 DIAGNOSIS — R262 Difficulty in walking, not elsewhere classified: Secondary | ICD-10-CM | POA: Diagnosis not present

## 2019-11-07 DIAGNOSIS — R0602 Shortness of breath: Secondary | ICD-10-CM | POA: Diagnosis not present

## 2019-11-07 DIAGNOSIS — J9602 Acute respiratory failure with hypercapnia: Secondary | ICD-10-CM | POA: Diagnosis not present

## 2019-11-23 DIAGNOSIS — E119 Type 2 diabetes mellitus without complications: Secondary | ICD-10-CM | POA: Diagnosis not present

## 2019-11-26 DIAGNOSIS — L853 Xerosis cutis: Secondary | ICD-10-CM | POA: Diagnosis not present

## 2019-11-26 DIAGNOSIS — E119 Type 2 diabetes mellitus without complications: Secondary | ICD-10-CM | POA: Diagnosis not present

## 2019-11-26 DIAGNOSIS — K219 Gastro-esophageal reflux disease without esophagitis: Secondary | ICD-10-CM | POA: Diagnosis not present

## 2019-12-03 DIAGNOSIS — F0150 Vascular dementia without behavioral disturbance: Secondary | ICD-10-CM | POA: Diagnosis not present

## 2019-12-03 DIAGNOSIS — F339 Major depressive disorder, recurrent, unspecified: Secondary | ICD-10-CM | POA: Diagnosis not present

## 2019-12-03 DIAGNOSIS — G4709 Other insomnia: Secondary | ICD-10-CM | POA: Diagnosis not present

## 2019-12-03 DIAGNOSIS — F1011 Alcohol abuse, in remission: Secondary | ICD-10-CM | POA: Diagnosis not present

## 2019-12-05 DIAGNOSIS — R0602 Shortness of breath: Secondary | ICD-10-CM | POA: Diagnosis not present

## 2019-12-05 DIAGNOSIS — R262 Difficulty in walking, not elsewhere classified: Secondary | ICD-10-CM | POA: Diagnosis not present

## 2019-12-05 DIAGNOSIS — J441 Chronic obstructive pulmonary disease with (acute) exacerbation: Secondary | ICD-10-CM | POA: Diagnosis not present

## 2019-12-05 DIAGNOSIS — J9602 Acute respiratory failure with hypercapnia: Secondary | ICD-10-CM | POA: Diagnosis not present

## 2019-12-17 DIAGNOSIS — J449 Chronic obstructive pulmonary disease, unspecified: Secondary | ICD-10-CM | POA: Diagnosis not present

## 2019-12-17 DIAGNOSIS — F0390 Unspecified dementia without behavioral disturbance: Secondary | ICD-10-CM | POA: Diagnosis not present

## 2019-12-17 DIAGNOSIS — E785 Hyperlipidemia, unspecified: Secondary | ICD-10-CM | POA: Diagnosis not present

## 2019-12-24 DIAGNOSIS — E119 Type 2 diabetes mellitus without complications: Secondary | ICD-10-CM | POA: Diagnosis not present

## 2019-12-24 DIAGNOSIS — I4892 Unspecified atrial flutter: Secondary | ICD-10-CM | POA: Diagnosis not present

## 2019-12-24 DIAGNOSIS — J449 Chronic obstructive pulmonary disease, unspecified: Secondary | ICD-10-CM | POA: Diagnosis not present

## 2019-12-26 DIAGNOSIS — Z0000 Encounter for general adult medical examination without abnormal findings: Secondary | ICD-10-CM | POA: Diagnosis not present

## 2019-12-26 DIAGNOSIS — E1165 Type 2 diabetes mellitus with hyperglycemia: Secondary | ICD-10-CM | POA: Diagnosis not present

## 2019-12-26 DIAGNOSIS — E782 Mixed hyperlipidemia: Secondary | ICD-10-CM | POA: Diagnosis not present

## 2019-12-26 DIAGNOSIS — I10 Essential (primary) hypertension: Secondary | ICD-10-CM | POA: Diagnosis not present

## 2019-12-31 DIAGNOSIS — R634 Abnormal weight loss: Secondary | ICD-10-CM | POA: Diagnosis not present

## 2019-12-31 DIAGNOSIS — G4709 Other insomnia: Secondary | ICD-10-CM | POA: Diagnosis not present

## 2019-12-31 DIAGNOSIS — F1010 Alcohol abuse, uncomplicated: Secondary | ICD-10-CM | POA: Diagnosis not present

## 2019-12-31 DIAGNOSIS — F0150 Vascular dementia without behavioral disturbance: Secondary | ICD-10-CM | POA: Diagnosis not present

## 2020-01-05 DIAGNOSIS — R0602 Shortness of breath: Secondary | ICD-10-CM | POA: Diagnosis not present

## 2020-01-05 DIAGNOSIS — J441 Chronic obstructive pulmonary disease with (acute) exacerbation: Secondary | ICD-10-CM | POA: Diagnosis not present

## 2020-01-05 DIAGNOSIS — J9602 Acute respiratory failure with hypercapnia: Secondary | ICD-10-CM | POA: Diagnosis not present

## 2020-01-05 DIAGNOSIS — R262 Difficulty in walking, not elsewhere classified: Secondary | ICD-10-CM | POA: Diagnosis not present

## 2020-01-09 DIAGNOSIS — E782 Mixed hyperlipidemia: Secondary | ICD-10-CM | POA: Diagnosis not present

## 2020-01-09 DIAGNOSIS — E1165 Type 2 diabetes mellitus with hyperglycemia: Secondary | ICD-10-CM | POA: Diagnosis not present

## 2020-01-09 DIAGNOSIS — I10 Essential (primary) hypertension: Secondary | ICD-10-CM | POA: Diagnosis not present

## 2020-01-16 DIAGNOSIS — R0609 Other forms of dyspnea: Secondary | ICD-10-CM | POA: Diagnosis not present

## 2020-01-16 DIAGNOSIS — J439 Emphysema, unspecified: Secondary | ICD-10-CM | POA: Diagnosis not present

## 2020-01-16 DIAGNOSIS — E1165 Type 2 diabetes mellitus with hyperglycemia: Secondary | ICD-10-CM | POA: Diagnosis not present

## 2020-01-16 DIAGNOSIS — J449 Chronic obstructive pulmonary disease, unspecified: Secondary | ICD-10-CM | POA: Diagnosis not present

## 2020-01-16 DIAGNOSIS — J9610 Chronic respiratory failure, unspecified whether with hypoxia or hypercapnia: Secondary | ICD-10-CM | POA: Diagnosis not present

## 2020-01-16 DIAGNOSIS — I10 Essential (primary) hypertension: Secondary | ICD-10-CM | POA: Diagnosis not present

## 2020-01-16 DIAGNOSIS — E782 Mixed hyperlipidemia: Secondary | ICD-10-CM | POA: Diagnosis not present

## 2020-01-21 DIAGNOSIS — K219 Gastro-esophageal reflux disease without esophagitis: Secondary | ICD-10-CM | POA: Diagnosis not present

## 2020-01-21 DIAGNOSIS — E039 Hypothyroidism, unspecified: Secondary | ICD-10-CM | POA: Diagnosis not present

## 2020-01-21 DIAGNOSIS — E119 Type 2 diabetes mellitus without complications: Secondary | ICD-10-CM | POA: Diagnosis not present

## 2020-01-28 DIAGNOSIS — G4709 Other insomnia: Secondary | ICD-10-CM | POA: Diagnosis not present

## 2020-01-28 DIAGNOSIS — F1011 Alcohol abuse, in remission: Secondary | ICD-10-CM | POA: Diagnosis not present

## 2020-01-28 DIAGNOSIS — F339 Major depressive disorder, recurrent, unspecified: Secondary | ICD-10-CM | POA: Diagnosis not present

## 2020-01-28 DIAGNOSIS — F0150 Vascular dementia without behavioral disturbance: Secondary | ICD-10-CM | POA: Diagnosis not present

## 2020-01-29 DIAGNOSIS — E113293 Type 2 diabetes mellitus with mild nonproliferative diabetic retinopathy without macular edema, bilateral: Secondary | ICD-10-CM | POA: Diagnosis not present

## 2020-01-29 DIAGNOSIS — H2513 Age-related nuclear cataract, bilateral: Secondary | ICD-10-CM | POA: Diagnosis not present

## 2020-01-29 DIAGNOSIS — H25042 Posterior subcapsular polar age-related cataract, left eye: Secondary | ICD-10-CM | POA: Diagnosis not present

## 2020-01-29 DIAGNOSIS — H25013 Cortical age-related cataract, bilateral: Secondary | ICD-10-CM | POA: Diagnosis not present

## 2020-02-04 DIAGNOSIS — J441 Chronic obstructive pulmonary disease with (acute) exacerbation: Secondary | ICD-10-CM | POA: Diagnosis not present

## 2020-02-04 DIAGNOSIS — J9602 Acute respiratory failure with hypercapnia: Secondary | ICD-10-CM | POA: Diagnosis not present

## 2020-02-04 DIAGNOSIS — R262 Difficulty in walking, not elsewhere classified: Secondary | ICD-10-CM | POA: Diagnosis not present

## 2020-02-04 DIAGNOSIS — R0602 Shortness of breath: Secondary | ICD-10-CM | POA: Diagnosis not present

## 2020-02-18 DIAGNOSIS — E119 Type 2 diabetes mellitus without complications: Secondary | ICD-10-CM | POA: Diagnosis not present

## 2020-02-18 DIAGNOSIS — J449 Chronic obstructive pulmonary disease, unspecified: Secondary | ICD-10-CM | POA: Diagnosis not present

## 2020-02-18 DIAGNOSIS — J069 Acute upper respiratory infection, unspecified: Secondary | ICD-10-CM | POA: Diagnosis not present

## 2020-02-19 DIAGNOSIS — J069 Acute upper respiratory infection, unspecified: Secondary | ICD-10-CM | POA: Diagnosis not present

## 2020-02-22 DIAGNOSIS — E119 Type 2 diabetes mellitus without complications: Secondary | ICD-10-CM | POA: Diagnosis not present

## 2020-02-25 DIAGNOSIS — F0150 Vascular dementia without behavioral disturbance: Secondary | ICD-10-CM | POA: Diagnosis not present

## 2020-02-25 DIAGNOSIS — F339 Major depressive disorder, recurrent, unspecified: Secondary | ICD-10-CM | POA: Diagnosis not present

## 2020-02-25 DIAGNOSIS — R634 Abnormal weight loss: Secondary | ICD-10-CM | POA: Diagnosis not present

## 2020-02-25 DIAGNOSIS — G4709 Other insomnia: Secondary | ICD-10-CM | POA: Diagnosis not present

## 2020-03-06 DIAGNOSIS — R262 Difficulty in walking, not elsewhere classified: Secondary | ICD-10-CM | POA: Diagnosis not present

## 2020-03-06 DIAGNOSIS — J9602 Acute respiratory failure with hypercapnia: Secondary | ICD-10-CM | POA: Diagnosis not present

## 2020-03-06 DIAGNOSIS — R0602 Shortness of breath: Secondary | ICD-10-CM | POA: Diagnosis not present

## 2020-03-06 DIAGNOSIS — J441 Chronic obstructive pulmonary disease with (acute) exacerbation: Secondary | ICD-10-CM | POA: Diagnosis not present

## 2020-03-17 DIAGNOSIS — K219 Gastro-esophageal reflux disease without esophagitis: Secondary | ICD-10-CM | POA: Diagnosis not present

## 2020-03-19 DIAGNOSIS — R109 Unspecified abdominal pain: Secondary | ICD-10-CM | POA: Diagnosis not present

## 2020-03-20 DIAGNOSIS — R05 Cough: Secondary | ICD-10-CM | POA: Diagnosis not present

## 2020-03-24 DIAGNOSIS — F0150 Vascular dementia without behavioral disturbance: Secondary | ICD-10-CM | POA: Diagnosis not present

## 2020-03-24 DIAGNOSIS — F339 Major depressive disorder, recurrent, unspecified: Secondary | ICD-10-CM | POA: Diagnosis not present

## 2020-03-24 DIAGNOSIS — G4709 Other insomnia: Secondary | ICD-10-CM | POA: Diagnosis not present

## 2020-03-24 DIAGNOSIS — R634 Abnormal weight loss: Secondary | ICD-10-CM | POA: Diagnosis not present

## 2020-04-05 DIAGNOSIS — R262 Difficulty in walking, not elsewhere classified: Secondary | ICD-10-CM | POA: Diagnosis not present

## 2020-04-05 DIAGNOSIS — J441 Chronic obstructive pulmonary disease with (acute) exacerbation: Secondary | ICD-10-CM | POA: Diagnosis not present

## 2020-04-05 DIAGNOSIS — J9602 Acute respiratory failure with hypercapnia: Secondary | ICD-10-CM | POA: Diagnosis not present

## 2020-04-05 DIAGNOSIS — R0602 Shortness of breath: Secondary | ICD-10-CM | POA: Diagnosis not present

## 2020-04-07 DIAGNOSIS — I5032 Chronic diastolic (congestive) heart failure: Secondary | ICD-10-CM | POA: Diagnosis not present

## 2020-04-07 DIAGNOSIS — I4892 Unspecified atrial flutter: Secondary | ICD-10-CM | POA: Diagnosis not present

## 2020-04-07 DIAGNOSIS — I10 Essential (primary) hypertension: Secondary | ICD-10-CM | POA: Diagnosis not present

## 2020-04-07 DIAGNOSIS — Z7901 Long term (current) use of anticoagulants: Secondary | ICD-10-CM | POA: Diagnosis not present

## 2020-05-06 DIAGNOSIS — R262 Difficulty in walking, not elsewhere classified: Secondary | ICD-10-CM | POA: Diagnosis not present

## 2020-05-06 DIAGNOSIS — J441 Chronic obstructive pulmonary disease with (acute) exacerbation: Secondary | ICD-10-CM | POA: Diagnosis not present

## 2020-05-06 DIAGNOSIS — J9602 Acute respiratory failure with hypercapnia: Secondary | ICD-10-CM | POA: Diagnosis not present

## 2020-05-06 DIAGNOSIS — R0602 Shortness of breath: Secondary | ICD-10-CM | POA: Diagnosis not present

## 2020-05-12 DIAGNOSIS — R0602 Shortness of breath: Secondary | ICD-10-CM | POA: Diagnosis not present

## 2020-05-12 DIAGNOSIS — F319 Bipolar disorder, unspecified: Secondary | ICD-10-CM | POA: Diagnosis not present

## 2020-05-12 DIAGNOSIS — R0682 Tachypnea, not elsewhere classified: Secondary | ICD-10-CM | POA: Diagnosis not present

## 2020-05-12 DIAGNOSIS — J441 Chronic obstructive pulmonary disease with (acute) exacerbation: Secondary | ICD-10-CM | POA: Diagnosis not present

## 2020-05-13 DIAGNOSIS — J9610 Chronic respiratory failure, unspecified whether with hypoxia or hypercapnia: Secondary | ICD-10-CM | POA: Diagnosis not present

## 2020-05-13 DIAGNOSIS — J439 Emphysema, unspecified: Secondary | ICD-10-CM | POA: Diagnosis not present

## 2020-05-13 DIAGNOSIS — J449 Chronic obstructive pulmonary disease, unspecified: Secondary | ICD-10-CM | POA: Diagnosis not present

## 2020-05-13 DIAGNOSIS — R0609 Other forms of dyspnea: Secondary | ICD-10-CM | POA: Diagnosis not present

## 2020-05-20 DIAGNOSIS — Z7901 Long term (current) use of anticoagulants: Secondary | ICD-10-CM | POA: Diagnosis not present

## 2020-05-20 DIAGNOSIS — R279 Unspecified lack of coordination: Secondary | ICD-10-CM | POA: Diagnosis not present

## 2020-05-20 DIAGNOSIS — R58 Hemorrhage, not elsewhere classified: Secondary | ICD-10-CM | POA: Diagnosis not present

## 2020-05-20 DIAGNOSIS — E119 Type 2 diabetes mellitus without complications: Secondary | ICD-10-CM | POA: Diagnosis not present

## 2020-05-20 DIAGNOSIS — Z87891 Personal history of nicotine dependence: Secondary | ICD-10-CM | POA: Diagnosis not present

## 2020-05-20 DIAGNOSIS — X58XXXA Exposure to other specified factors, initial encounter: Secondary | ICD-10-CM | POA: Diagnosis not present

## 2020-05-20 DIAGNOSIS — Z8679 Personal history of other diseases of the circulatory system: Secondary | ICD-10-CM | POA: Diagnosis not present

## 2020-05-20 DIAGNOSIS — Z79899 Other long term (current) drug therapy: Secondary | ICD-10-CM | POA: Diagnosis not present

## 2020-05-20 DIAGNOSIS — Z7984 Long term (current) use of oral hypoglycemic drugs: Secondary | ICD-10-CM | POA: Diagnosis not present

## 2020-05-20 DIAGNOSIS — S99921A Unspecified injury of right foot, initial encounter: Secondary | ICD-10-CM | POA: Diagnosis not present

## 2020-05-20 DIAGNOSIS — Z8709 Personal history of other diseases of the respiratory system: Secondary | ICD-10-CM | POA: Diagnosis not present

## 2020-05-20 DIAGNOSIS — L608 Other nail disorders: Secondary | ICD-10-CM | POA: Diagnosis not present

## 2020-05-20 DIAGNOSIS — M6281 Muscle weakness (generalized): Secondary | ICD-10-CM | POA: Diagnosis not present

## 2020-05-22 DIAGNOSIS — E039 Hypothyroidism, unspecified: Secondary | ICD-10-CM | POA: Diagnosis not present

## 2020-05-22 DIAGNOSIS — F1010 Alcohol abuse, uncomplicated: Secondary | ICD-10-CM | POA: Diagnosis not present

## 2020-05-22 DIAGNOSIS — R634 Abnormal weight loss: Secondary | ICD-10-CM | POA: Diagnosis not present

## 2020-05-22 DIAGNOSIS — F0150 Vascular dementia without behavioral disturbance: Secondary | ICD-10-CM | POA: Diagnosis not present

## 2020-05-23 DIAGNOSIS — E119 Type 2 diabetes mellitus without complications: Secondary | ICD-10-CM | POA: Diagnosis not present

## 2020-05-26 DIAGNOSIS — B351 Tinea unguium: Secondary | ICD-10-CM | POA: Diagnosis not present

## 2020-05-26 DIAGNOSIS — L603 Nail dystrophy: Secondary | ICD-10-CM | POA: Diagnosis not present

## 2020-05-26 DIAGNOSIS — L601 Onycholysis: Secondary | ICD-10-CM | POA: Diagnosis not present

## 2020-05-29 DIAGNOSIS — J441 Chronic obstructive pulmonary disease with (acute) exacerbation: Secondary | ICD-10-CM | POA: Diagnosis not present

## 2020-05-29 DIAGNOSIS — R262 Difficulty in walking, not elsewhere classified: Secondary | ICD-10-CM | POA: Diagnosis not present

## 2020-05-29 DIAGNOSIS — R0602 Shortness of breath: Secondary | ICD-10-CM | POA: Diagnosis not present

## 2020-05-29 DIAGNOSIS — J9602 Acute respiratory failure with hypercapnia: Secondary | ICD-10-CM | POA: Diagnosis not present

## 2020-06-06 DIAGNOSIS — R0602 Shortness of breath: Secondary | ICD-10-CM | POA: Diagnosis not present

## 2020-06-06 DIAGNOSIS — R262 Difficulty in walking, not elsewhere classified: Secondary | ICD-10-CM | POA: Diagnosis not present

## 2020-06-06 DIAGNOSIS — J9602 Acute respiratory failure with hypercapnia: Secondary | ICD-10-CM | POA: Diagnosis not present

## 2020-06-06 DIAGNOSIS — J441 Chronic obstructive pulmonary disease with (acute) exacerbation: Secondary | ICD-10-CM | POA: Diagnosis not present

## 2020-06-09 DIAGNOSIS — L603 Nail dystrophy: Secondary | ICD-10-CM | POA: Diagnosis not present

## 2020-06-09 DIAGNOSIS — S0093XA Contusion of unspecified part of head, initial encounter: Secondary | ICD-10-CM | POA: Diagnosis not present

## 2020-06-09 DIAGNOSIS — L601 Onycholysis: Secondary | ICD-10-CM | POA: Diagnosis not present

## 2020-06-09 DIAGNOSIS — B351 Tinea unguium: Secondary | ICD-10-CM | POA: Diagnosis not present

## 2020-06-16 DIAGNOSIS — F0150 Vascular dementia without behavioral disturbance: Secondary | ICD-10-CM | POA: Diagnosis not present

## 2020-06-16 DIAGNOSIS — R634 Abnormal weight loss: Secondary | ICD-10-CM | POA: Diagnosis not present

## 2020-06-16 DIAGNOSIS — F1010 Alcohol abuse, uncomplicated: Secondary | ICD-10-CM | POA: Diagnosis not present

## 2020-06-16 DIAGNOSIS — E039 Hypothyroidism, unspecified: Secondary | ICD-10-CM | POA: Diagnosis not present

## 2020-06-28 DIAGNOSIS — J441 Chronic obstructive pulmonary disease with (acute) exacerbation: Secondary | ICD-10-CM | POA: Diagnosis not present

## 2020-06-28 DIAGNOSIS — R262 Difficulty in walking, not elsewhere classified: Secondary | ICD-10-CM | POA: Diagnosis not present

## 2020-06-28 DIAGNOSIS — J9602 Acute respiratory failure with hypercapnia: Secondary | ICD-10-CM | POA: Diagnosis not present

## 2020-06-28 DIAGNOSIS — R0602 Shortness of breath: Secondary | ICD-10-CM | POA: Diagnosis not present

## 2020-07-06 DIAGNOSIS — R262 Difficulty in walking, not elsewhere classified: Secondary | ICD-10-CM | POA: Diagnosis not present

## 2020-07-06 DIAGNOSIS — J9602 Acute respiratory failure with hypercapnia: Secondary | ICD-10-CM | POA: Diagnosis not present

## 2020-07-06 DIAGNOSIS — R0602 Shortness of breath: Secondary | ICD-10-CM | POA: Diagnosis not present

## 2020-07-06 DIAGNOSIS — J441 Chronic obstructive pulmonary disease with (acute) exacerbation: Secondary | ICD-10-CM | POA: Diagnosis not present

## 2020-07-14 DIAGNOSIS — F0150 Vascular dementia without behavioral disturbance: Secondary | ICD-10-CM | POA: Diagnosis not present

## 2020-07-14 DIAGNOSIS — R634 Abnormal weight loss: Secondary | ICD-10-CM | POA: Diagnosis not present

## 2020-07-14 DIAGNOSIS — F1010 Alcohol abuse, uncomplicated: Secondary | ICD-10-CM | POA: Diagnosis not present

## 2020-07-14 DIAGNOSIS — F339 Major depressive disorder, recurrent, unspecified: Secondary | ICD-10-CM | POA: Diagnosis not present

## 2020-07-21 DIAGNOSIS — B351 Tinea unguium: Secondary | ICD-10-CM | POA: Diagnosis not present

## 2020-07-21 DIAGNOSIS — L601 Onycholysis: Secondary | ICD-10-CM | POA: Diagnosis not present

## 2020-07-21 DIAGNOSIS — L603 Nail dystrophy: Secondary | ICD-10-CM | POA: Diagnosis not present

## 2020-07-29 DIAGNOSIS — E1151 Type 2 diabetes mellitus with diabetic peripheral angiopathy without gangrene: Secondary | ICD-10-CM | POA: Diagnosis not present

## 2020-07-29 DIAGNOSIS — J9602 Acute respiratory failure with hypercapnia: Secondary | ICD-10-CM | POA: Diagnosis not present

## 2020-07-29 DIAGNOSIS — R262 Difficulty in walking, not elsewhere classified: Secondary | ICD-10-CM | POA: Diagnosis not present

## 2020-07-29 DIAGNOSIS — B351 Tinea unguium: Secondary | ICD-10-CM | POA: Diagnosis not present

## 2020-07-29 DIAGNOSIS — J441 Chronic obstructive pulmonary disease with (acute) exacerbation: Secondary | ICD-10-CM | POA: Diagnosis not present

## 2020-07-29 DIAGNOSIS — Z7984 Long term (current) use of oral hypoglycemic drugs: Secondary | ICD-10-CM | POA: Diagnosis not present

## 2020-07-29 DIAGNOSIS — R0602 Shortness of breath: Secondary | ICD-10-CM | POA: Diagnosis not present

## 2020-08-04 DIAGNOSIS — R0682 Tachypnea, not elsewhere classified: Secondary | ICD-10-CM | POA: Diagnosis not present

## 2020-08-04 DIAGNOSIS — I10 Essential (primary) hypertension: Secondary | ICD-10-CM | POA: Diagnosis not present

## 2020-08-04 DIAGNOSIS — J441 Chronic obstructive pulmonary disease with (acute) exacerbation: Secondary | ICD-10-CM | POA: Diagnosis not present

## 2020-08-04 DIAGNOSIS — F319 Bipolar disorder, unspecified: Secondary | ICD-10-CM | POA: Diagnosis not present

## 2020-08-06 DIAGNOSIS — E559 Vitamin D deficiency, unspecified: Secondary | ICD-10-CM | POA: Diagnosis not present

## 2020-08-06 DIAGNOSIS — I739 Peripheral vascular disease, unspecified: Secondary | ICD-10-CM | POA: Diagnosis not present

## 2020-08-06 DIAGNOSIS — E785 Hyperlipidemia, unspecified: Secondary | ICD-10-CM | POA: Diagnosis not present

## 2020-08-06 DIAGNOSIS — I10 Essential (primary) hypertension: Secondary | ICD-10-CM | POA: Diagnosis not present

## 2020-08-06 DIAGNOSIS — R0602 Shortness of breath: Secondary | ICD-10-CM | POA: Diagnosis not present

## 2020-08-06 DIAGNOSIS — R262 Difficulty in walking, not elsewhere classified: Secondary | ICD-10-CM | POA: Diagnosis not present

## 2020-08-06 DIAGNOSIS — I4892 Unspecified atrial flutter: Secondary | ICD-10-CM | POA: Diagnosis not present

## 2020-08-06 DIAGNOSIS — E119 Type 2 diabetes mellitus without complications: Secondary | ICD-10-CM | POA: Diagnosis not present

## 2020-08-06 DIAGNOSIS — J441 Chronic obstructive pulmonary disease with (acute) exacerbation: Secondary | ICD-10-CM | POA: Diagnosis not present

## 2020-08-06 DIAGNOSIS — J9602 Acute respiratory failure with hypercapnia: Secondary | ICD-10-CM | POA: Diagnosis not present

## 2020-08-11 DIAGNOSIS — F1010 Alcohol abuse, uncomplicated: Secondary | ICD-10-CM | POA: Diagnosis not present

## 2020-08-11 DIAGNOSIS — R634 Abnormal weight loss: Secondary | ICD-10-CM | POA: Diagnosis not present

## 2020-08-11 DIAGNOSIS — F0150 Vascular dementia without behavioral disturbance: Secondary | ICD-10-CM | POA: Diagnosis not present

## 2020-08-11 DIAGNOSIS — E039 Hypothyroidism, unspecified: Secondary | ICD-10-CM | POA: Diagnosis not present

## 2020-08-14 IMAGING — CT CT ANGIO CHEST
2 of 7 series · 18 of 46 positions shown · IV contrast (APPLIED)
Comparison: Chest x-ray 10/11/2018 and 10/16/2016

CLINICAL DATA: Chest pain possible pulmonary embolism.

EXAM:
CT ANGIOGRAPHY CHEST WITH CONTRAST
TECHNIQUE: Multidetector CT imaging of the chest was performed using the
standard protocol during bolus administration of intravenous
contrast. Multiplanar CT image reconstructions and MIPs were
obtained to evaluate the vascular anatomy.
CONTRAST:  100mL [id number] IOPAMIDOL ([id number]) INJECTION 76%

[Series 7: thins · axial · 0.80mm/px · z∈[+1189,+1488]mm · 15 of 481 slices shown]
[im 27/481  lung]
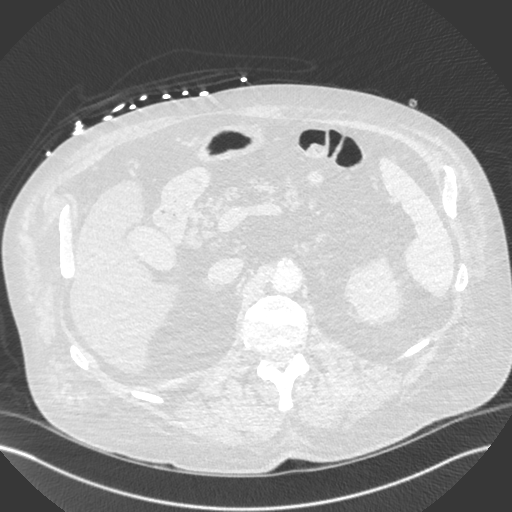
[im 54/481  soft-tissue]
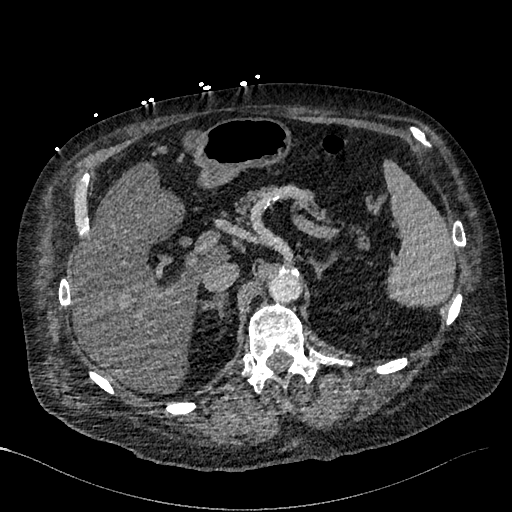
[im 81/481  lung]
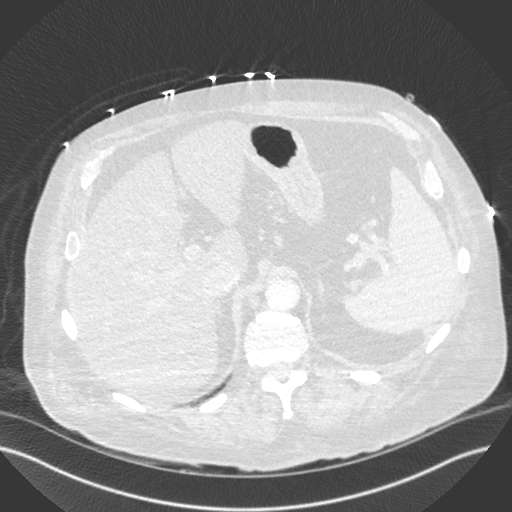
[im 107/481  soft-tissue]
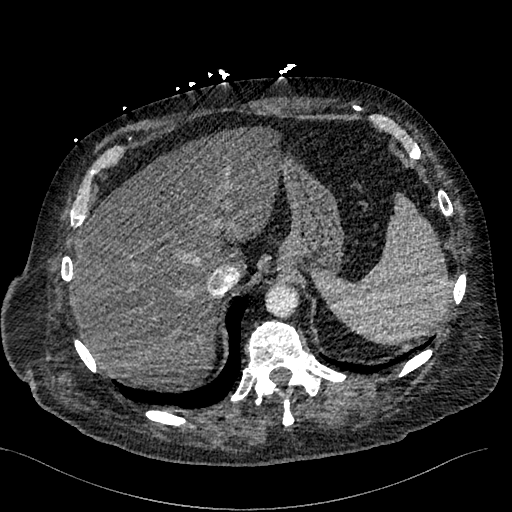
[im 161/481  lung]
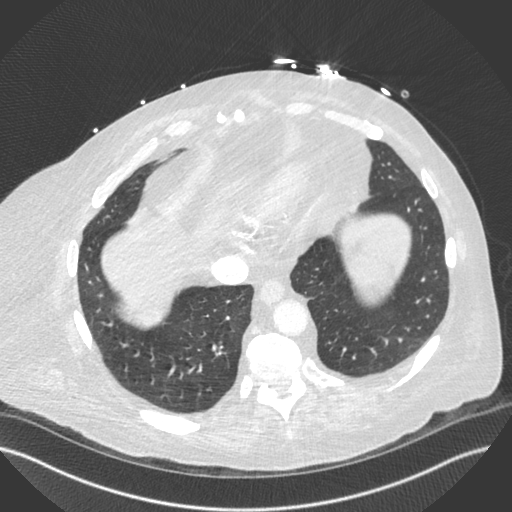
[im 187/481  soft-tissue]
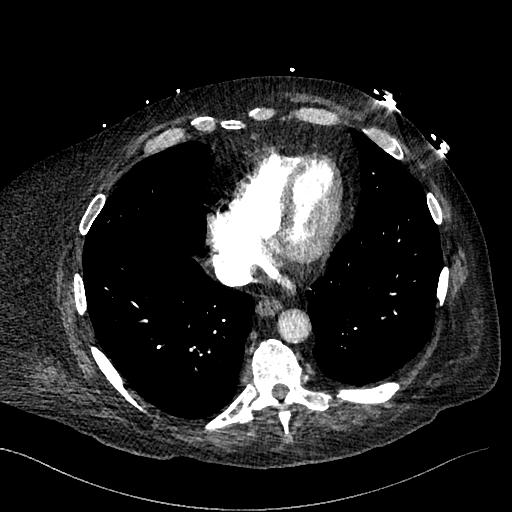
[im 214/481  lung]
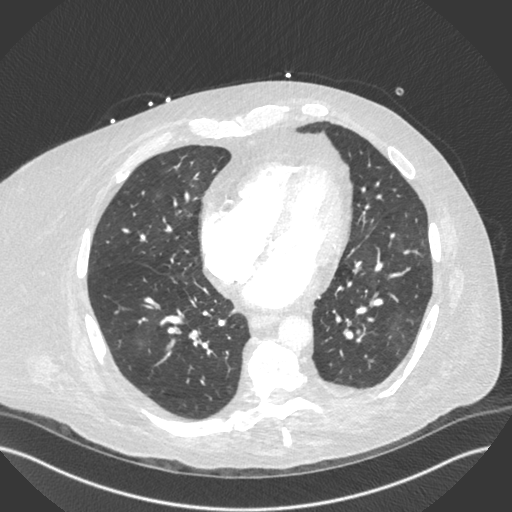
[im 241/481  soft-tissue]
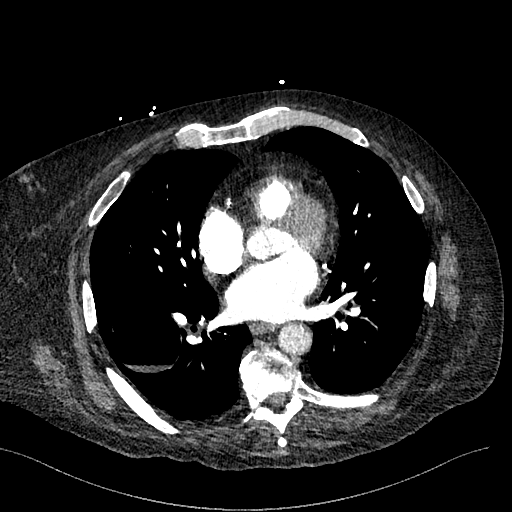
[im 267/481  lung]
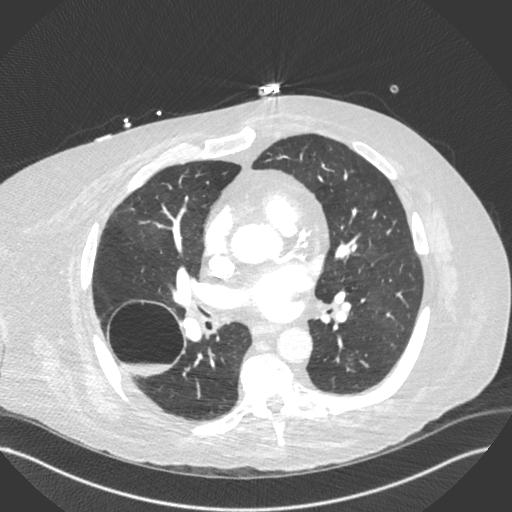
[im 294/481  soft-tissue]
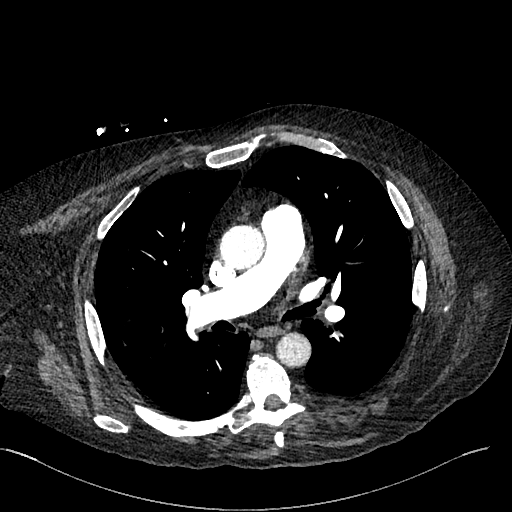
[im 321/481  lung]
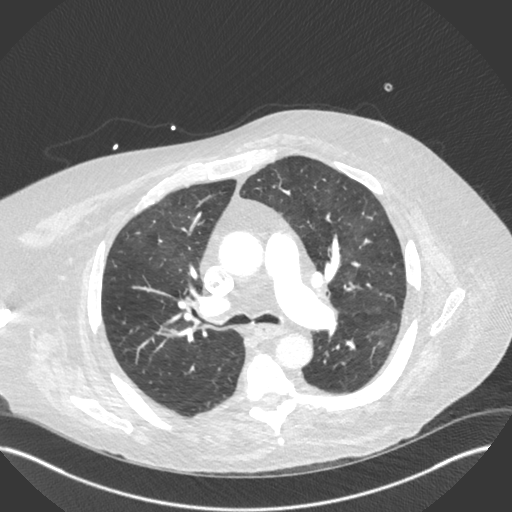
[im 374/481  soft-tissue]
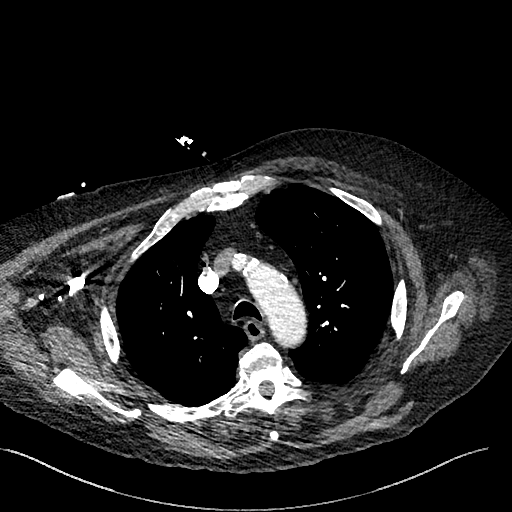
[im 401/481  lung]
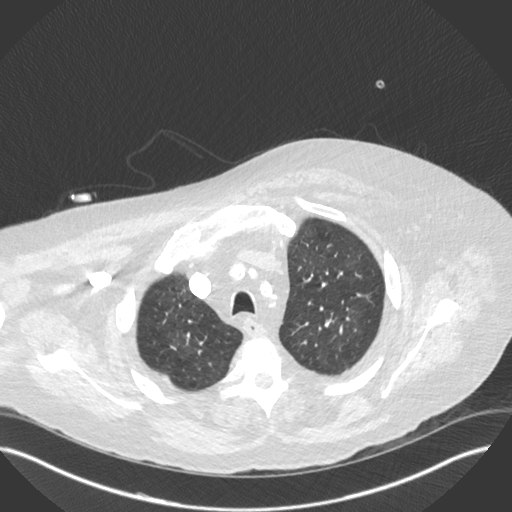
[im 427/481  soft-tissue]
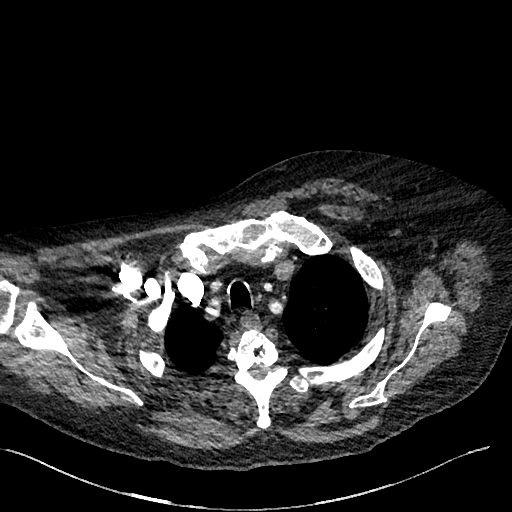
[im 454/481  lung]
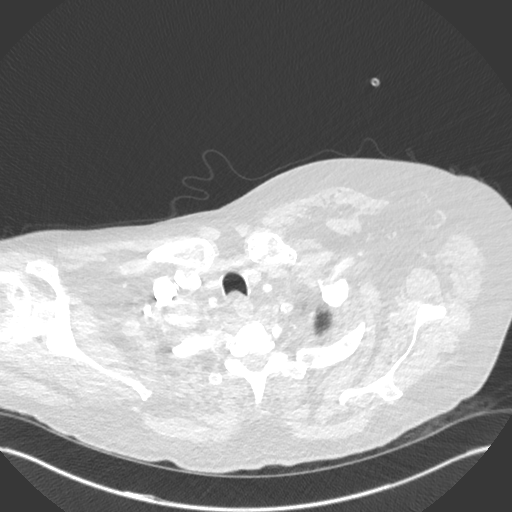

[Series 8: cor · coronal · 0.66mm/px · 3 of 151 slices shown]
[im 38/151  soft-tissue]
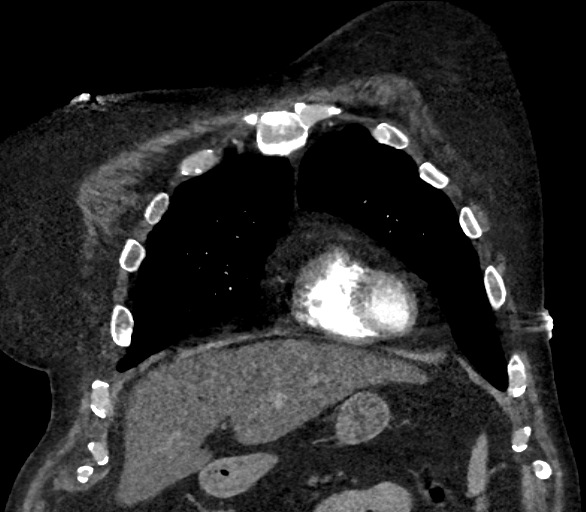
[im 76/151  soft-tissue]
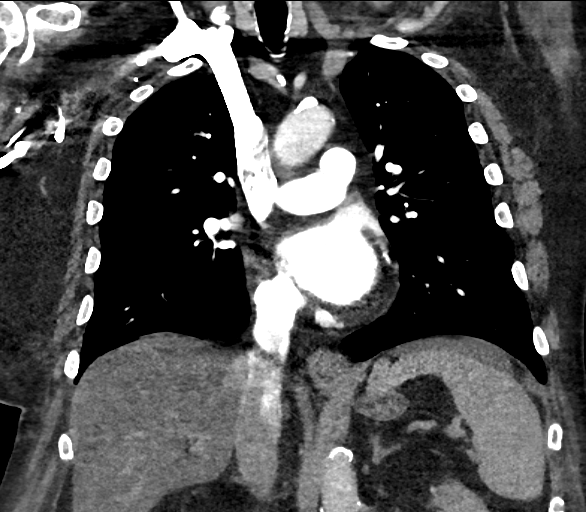
[im 113/151  soft-tissue]
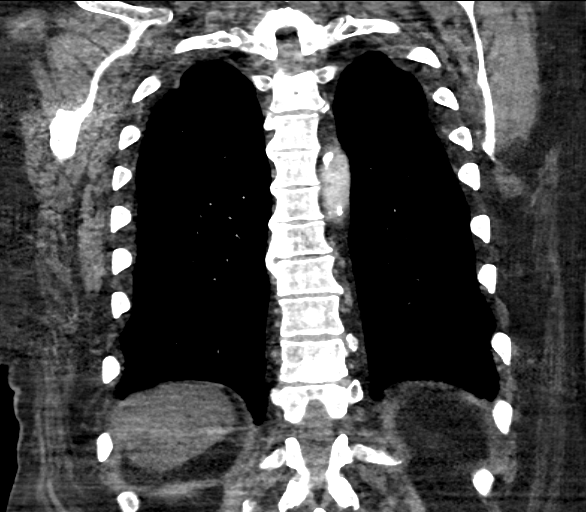

[18 of 46 positions shown; findings below may reference images not displayed]

FINDINGS: Cardiovascular: Heart is normal size. Calcified plaque over the left
main and 3 vessel coronary arteries. Minimal calcified plaque over
the thoracic aorta. Thoracic aorta is otherwise normal in caliber
without evidence of dissection. Pulmonary arterial system is well
opacified without evidence of emboli.

Mediastinum/Nodes: No mediastinal or hilar adenopathy. Remaining
mediastinal structures are normal.

Lungs/Pleura: Lungs are adequately inflated with subtle hazy patchy
airspace density bilaterally worse over the left lower lobe likely
due to an infectious or inflammatory process. No effusion. Known
chronic thin-walled cavitary lesion with small air-fluid level over
the right lower lobe measuring 6.1 x 6.2 cm in AP and transverse
dimension. This continues to slowly increase in size. There is no
evidence of mural nodularity or focal wall thickening. 2 mm
subpleural nodule over the lateral right upper lobe. Airways are
otherwise unremarkable.

Upper Abdomen: Mild diffuse low-attenuation of the liver. Minimal
calcified plaque over the abdominal aorta. Mild cholelithiasis.

Musculoskeletal: Degenerative change of the spine.

Review of the MIP images confirms the above findings.
IMPRESSION: No evidence of pulmonary embolism.

Subtle bilateral patchy hazy airspace process worse over the left
lower lobe likely an acute infectious or inflammatory process.

Chronic slowly increase in size of a thin-walled cavitary lesion
over the right lower lobe with small air-fluid level. No suspicious
wall thickening or mural nodularity.

Aortic Atherosclerosis ([id number]). Atherosclerotic coronary
artery disease.

Cholelithiasis.

Mild hepatic steatosis.

## 2020-08-21 DIAGNOSIS — E039 Hypothyroidism, unspecified: Secondary | ICD-10-CM | POA: Diagnosis not present

## 2020-08-21 DIAGNOSIS — G4700 Insomnia, unspecified: Secondary | ICD-10-CM | POA: Diagnosis not present

## 2020-08-27 DIAGNOSIS — E039 Hypothyroidism, unspecified: Secondary | ICD-10-CM | POA: Diagnosis not present

## 2020-08-28 DIAGNOSIS — J441 Chronic obstructive pulmonary disease with (acute) exacerbation: Secondary | ICD-10-CM | POA: Diagnosis not present

## 2020-08-28 DIAGNOSIS — R0602 Shortness of breath: Secondary | ICD-10-CM | POA: Diagnosis not present

## 2020-08-28 DIAGNOSIS — J9602 Acute respiratory failure with hypercapnia: Secondary | ICD-10-CM | POA: Diagnosis not present

## 2020-08-28 DIAGNOSIS — R262 Difficulty in walking, not elsewhere classified: Secondary | ICD-10-CM | POA: Diagnosis not present

## 2020-09-01 DIAGNOSIS — J438 Other emphysema: Secondary | ICD-10-CM | POA: Diagnosis not present

## 2020-09-01 DIAGNOSIS — R0600 Dyspnea, unspecified: Secondary | ICD-10-CM | POA: Diagnosis not present

## 2020-09-01 DIAGNOSIS — I2510 Atherosclerotic heart disease of native coronary artery without angina pectoris: Secondary | ICD-10-CM | POA: Diagnosis not present

## 2020-09-05 DIAGNOSIS — J9602 Acute respiratory failure with hypercapnia: Secondary | ICD-10-CM | POA: Diagnosis not present

## 2020-09-05 DIAGNOSIS — R0602 Shortness of breath: Secondary | ICD-10-CM | POA: Diagnosis not present

## 2020-09-05 DIAGNOSIS — R262 Difficulty in walking, not elsewhere classified: Secondary | ICD-10-CM | POA: Diagnosis not present

## 2020-09-05 DIAGNOSIS — J441 Chronic obstructive pulmonary disease with (acute) exacerbation: Secondary | ICD-10-CM | POA: Diagnosis not present

## 2020-09-08 DIAGNOSIS — R634 Abnormal weight loss: Secondary | ICD-10-CM | POA: Diagnosis not present

## 2020-09-08 DIAGNOSIS — F339 Major depressive disorder, recurrent, unspecified: Secondary | ICD-10-CM | POA: Diagnosis not present

## 2020-09-08 DIAGNOSIS — F0150 Vascular dementia without behavioral disturbance: Secondary | ICD-10-CM | POA: Diagnosis not present

## 2020-09-08 DIAGNOSIS — F1010 Alcohol abuse, uncomplicated: Secondary | ICD-10-CM | POA: Diagnosis not present

## 2020-09-28 DIAGNOSIS — J441 Chronic obstructive pulmonary disease with (acute) exacerbation: Secondary | ICD-10-CM | POA: Diagnosis not present

## 2020-09-28 DIAGNOSIS — R0602 Shortness of breath: Secondary | ICD-10-CM | POA: Diagnosis not present

## 2020-09-28 DIAGNOSIS — J9602 Acute respiratory failure with hypercapnia: Secondary | ICD-10-CM | POA: Diagnosis not present

## 2020-09-28 DIAGNOSIS — R262 Difficulty in walking, not elsewhere classified: Secondary | ICD-10-CM | POA: Diagnosis not present

## 2020-10-06 DIAGNOSIS — R262 Difficulty in walking, not elsewhere classified: Secondary | ICD-10-CM | POA: Diagnosis not present

## 2020-10-06 DIAGNOSIS — J441 Chronic obstructive pulmonary disease with (acute) exacerbation: Secondary | ICD-10-CM | POA: Diagnosis not present

## 2020-10-06 DIAGNOSIS — R0602 Shortness of breath: Secondary | ICD-10-CM | POA: Diagnosis not present

## 2020-10-06 DIAGNOSIS — J9602 Acute respiratory failure with hypercapnia: Secondary | ICD-10-CM | POA: Diagnosis not present

## 2020-10-13 DIAGNOSIS — R634 Abnormal weight loss: Secondary | ICD-10-CM | POA: Diagnosis not present

## 2020-10-13 DIAGNOSIS — G4700 Insomnia, unspecified: Secondary | ICD-10-CM | POA: Diagnosis not present

## 2020-10-13 DIAGNOSIS — F1010 Alcohol abuse, uncomplicated: Secondary | ICD-10-CM | POA: Diagnosis not present

## 2020-10-13 DIAGNOSIS — F339 Major depressive disorder, recurrent, unspecified: Secondary | ICD-10-CM | POA: Diagnosis not present

## 2020-10-13 DIAGNOSIS — F0150 Vascular dementia without behavioral disturbance: Secondary | ICD-10-CM | POA: Diagnosis not present

## 2020-10-13 DIAGNOSIS — R0682 Tachypnea, not elsewhere classified: Secondary | ICD-10-CM | POA: Diagnosis not present

## 2020-10-13 DIAGNOSIS — F319 Bipolar disorder, unspecified: Secondary | ICD-10-CM | POA: Diagnosis not present

## 2020-10-13 DIAGNOSIS — J441 Chronic obstructive pulmonary disease with (acute) exacerbation: Secondary | ICD-10-CM | POA: Diagnosis not present

## 2020-10-29 DIAGNOSIS — F1010 Alcohol abuse, uncomplicated: Secondary | ICD-10-CM | POA: Diagnosis not present

## 2020-10-29 DIAGNOSIS — R0602 Shortness of breath: Secondary | ICD-10-CM | POA: Diagnosis not present

## 2020-10-29 DIAGNOSIS — J441 Chronic obstructive pulmonary disease with (acute) exacerbation: Secondary | ICD-10-CM | POA: Diagnosis not present

## 2020-10-29 DIAGNOSIS — F0150 Vascular dementia without behavioral disturbance: Secondary | ICD-10-CM | POA: Diagnosis not present

## 2020-10-29 DIAGNOSIS — J9602 Acute respiratory failure with hypercapnia: Secondary | ICD-10-CM | POA: Diagnosis not present

## 2020-10-29 DIAGNOSIS — F0631 Mood disorder due to known physiological condition with depressive features: Secondary | ICD-10-CM | POA: Diagnosis not present

## 2020-10-29 DIAGNOSIS — R262 Difficulty in walking, not elsewhere classified: Secondary | ICD-10-CM | POA: Diagnosis not present

## 2020-11-06 DIAGNOSIS — J441 Chronic obstructive pulmonary disease with (acute) exacerbation: Secondary | ICD-10-CM | POA: Diagnosis not present

## 2020-11-06 DIAGNOSIS — J9602 Acute respiratory failure with hypercapnia: Secondary | ICD-10-CM | POA: Diagnosis not present

## 2020-11-06 DIAGNOSIS — R262 Difficulty in walking, not elsewhere classified: Secondary | ICD-10-CM | POA: Diagnosis not present

## 2020-11-06 DIAGNOSIS — R0602 Shortness of breath: Secondary | ICD-10-CM | POA: Diagnosis not present

## 2020-11-10 DIAGNOSIS — R634 Abnormal weight loss: Secondary | ICD-10-CM | POA: Diagnosis not present

## 2020-11-10 DIAGNOSIS — F0631 Mood disorder due to known physiological condition with depressive features: Secondary | ICD-10-CM | POA: Diagnosis not present

## 2020-11-10 DIAGNOSIS — F1010 Alcohol abuse, uncomplicated: Secondary | ICD-10-CM | POA: Diagnosis not present

## 2020-11-10 DIAGNOSIS — R2689 Other abnormalities of gait and mobility: Secondary | ICD-10-CM | POA: Diagnosis not present

## 2020-11-10 DIAGNOSIS — Z9981 Dependence on supplemental oxygen: Secondary | ICD-10-CM | POA: Diagnosis not present

## 2020-11-10 DIAGNOSIS — W19XXXA Unspecified fall, initial encounter: Secondary | ICD-10-CM | POA: Diagnosis not present

## 2020-11-10 DIAGNOSIS — S40021A Contusion of right upper arm, initial encounter: Secondary | ICD-10-CM | POA: Diagnosis not present

## 2020-11-10 DIAGNOSIS — F339 Major depressive disorder, recurrent, unspecified: Secondary | ICD-10-CM | POA: Diagnosis not present

## 2020-11-24 DIAGNOSIS — J441 Chronic obstructive pulmonary disease with (acute) exacerbation: Secondary | ICD-10-CM | POA: Diagnosis not present

## 2020-11-24 DIAGNOSIS — Z7901 Long term (current) use of anticoagulants: Secondary | ICD-10-CM | POA: Diagnosis not present

## 2020-11-24 DIAGNOSIS — F319 Bipolar disorder, unspecified: Secondary | ICD-10-CM | POA: Diagnosis not present

## 2020-11-24 DIAGNOSIS — G4700 Insomnia, unspecified: Secondary | ICD-10-CM | POA: Diagnosis not present

## 2020-11-25 DIAGNOSIS — R0609 Other forms of dyspnea: Secondary | ICD-10-CM | POA: Diagnosis not present

## 2020-11-25 DIAGNOSIS — J449 Chronic obstructive pulmonary disease, unspecified: Secondary | ICD-10-CM | POA: Diagnosis not present

## 2020-11-25 DIAGNOSIS — J439 Emphysema, unspecified: Secondary | ICD-10-CM | POA: Diagnosis not present

## 2020-11-25 DIAGNOSIS — J9610 Chronic respiratory failure, unspecified whether with hypoxia or hypercapnia: Secondary | ICD-10-CM | POA: Diagnosis not present

## 2020-11-26 DIAGNOSIS — J441 Chronic obstructive pulmonary disease with (acute) exacerbation: Secondary | ICD-10-CM | POA: Diagnosis not present

## 2020-11-26 DIAGNOSIS — J9602 Acute respiratory failure with hypercapnia: Secondary | ICD-10-CM | POA: Diagnosis not present

## 2020-11-26 DIAGNOSIS — R262 Difficulty in walking, not elsewhere classified: Secondary | ICD-10-CM | POA: Diagnosis not present

## 2020-11-26 DIAGNOSIS — R0602 Shortness of breath: Secondary | ICD-10-CM | POA: Diagnosis not present

## 2020-12-04 DIAGNOSIS — R262 Difficulty in walking, not elsewhere classified: Secondary | ICD-10-CM | POA: Diagnosis not present

## 2020-12-04 DIAGNOSIS — J441 Chronic obstructive pulmonary disease with (acute) exacerbation: Secondary | ICD-10-CM | POA: Diagnosis not present

## 2020-12-04 DIAGNOSIS — R0602 Shortness of breath: Secondary | ICD-10-CM | POA: Diagnosis not present

## 2020-12-04 DIAGNOSIS — J9602 Acute respiratory failure with hypercapnia: Secondary | ICD-10-CM | POA: Diagnosis not present

## 2020-12-15 DIAGNOSIS — F1010 Alcohol abuse, uncomplicated: Secondary | ICD-10-CM | POA: Diagnosis not present

## 2020-12-15 DIAGNOSIS — I10 Essential (primary) hypertension: Secondary | ICD-10-CM | POA: Diagnosis not present

## 2020-12-15 DIAGNOSIS — G4700 Insomnia, unspecified: Secondary | ICD-10-CM | POA: Diagnosis not present

## 2020-12-15 DIAGNOSIS — F0150 Vascular dementia without behavioral disturbance: Secondary | ICD-10-CM | POA: Diagnosis not present

## 2020-12-15 DIAGNOSIS — R634 Abnormal weight loss: Secondary | ICD-10-CM | POA: Diagnosis not present

## 2020-12-15 DIAGNOSIS — F339 Major depressive disorder, recurrent, unspecified: Secondary | ICD-10-CM | POA: Diagnosis not present

## 2020-12-15 DIAGNOSIS — E1142 Type 2 diabetes mellitus with diabetic polyneuropathy: Secondary | ICD-10-CM | POA: Diagnosis not present

## 2020-12-15 DIAGNOSIS — R319 Hematuria, unspecified: Secondary | ICD-10-CM | POA: Diagnosis not present

## 2020-12-18 DIAGNOSIS — N390 Urinary tract infection, site not specified: Secondary | ICD-10-CM | POA: Diagnosis not present

## 2020-12-24 DIAGNOSIS — N390 Urinary tract infection, site not specified: Secondary | ICD-10-CM | POA: Diagnosis not present

## 2020-12-24 DIAGNOSIS — Z1620 Resistance to unspecified antibiotic: Secondary | ICD-10-CM | POA: Diagnosis not present

## 2020-12-24 DIAGNOSIS — I10 Essential (primary) hypertension: Secondary | ICD-10-CM | POA: Diagnosis not present

## 2020-12-24 DIAGNOSIS — R799 Abnormal finding of blood chemistry, unspecified: Secondary | ICD-10-CM | POA: Diagnosis not present

## 2020-12-27 DIAGNOSIS — J441 Chronic obstructive pulmonary disease with (acute) exacerbation: Secondary | ICD-10-CM | POA: Diagnosis not present

## 2020-12-27 DIAGNOSIS — R262 Difficulty in walking, not elsewhere classified: Secondary | ICD-10-CM | POA: Diagnosis not present

## 2020-12-27 DIAGNOSIS — R0602 Shortness of breath: Secondary | ICD-10-CM | POA: Diagnosis not present

## 2020-12-27 DIAGNOSIS — J9602 Acute respiratory failure with hypercapnia: Secondary | ICD-10-CM | POA: Diagnosis not present

## 2020-12-27 DIAGNOSIS — N390 Urinary tract infection, site not specified: Secondary | ICD-10-CM | POA: Diagnosis not present

## 2020-12-31 DIAGNOSIS — Z7984 Long term (current) use of oral hypoglycemic drugs: Secondary | ICD-10-CM | POA: Diagnosis not present

## 2020-12-31 DIAGNOSIS — S79912A Unspecified injury of left hip, initial encounter: Secondary | ICD-10-CM | POA: Diagnosis not present

## 2020-12-31 DIAGNOSIS — E119 Type 2 diabetes mellitus without complications: Secondary | ICD-10-CM | POA: Diagnosis not present

## 2020-12-31 DIAGNOSIS — J449 Chronic obstructive pulmonary disease, unspecified: Secondary | ICD-10-CM | POA: Diagnosis not present

## 2020-12-31 DIAGNOSIS — Z79899 Other long term (current) drug therapy: Secondary | ICD-10-CM | POA: Diagnosis not present

## 2020-12-31 DIAGNOSIS — W19XXXA Unspecified fall, initial encounter: Secondary | ICD-10-CM | POA: Diagnosis not present

## 2020-12-31 DIAGNOSIS — W133XXA Fall through floor, initial encounter: Secondary | ICD-10-CM | POA: Diagnosis not present

## 2020-12-31 DIAGNOSIS — S8992XA Unspecified injury of left lower leg, initial encounter: Secondary | ICD-10-CM | POA: Diagnosis not present

## 2020-12-31 DIAGNOSIS — R58 Hemorrhage, not elsewhere classified: Secondary | ICD-10-CM | POA: Diagnosis not present

## 2020-12-31 DIAGNOSIS — S8012XA Contusion of left lower leg, initial encounter: Secondary | ICD-10-CM | POA: Diagnosis not present

## 2020-12-31 DIAGNOSIS — I959 Hypotension, unspecified: Secondary | ICD-10-CM | POA: Diagnosis not present

## 2020-12-31 DIAGNOSIS — N289 Disorder of kidney and ureter, unspecified: Secondary | ICD-10-CM | POA: Diagnosis not present

## 2020-12-31 DIAGNOSIS — M79603 Pain in arm, unspecified: Secondary | ICD-10-CM | POA: Diagnosis not present

## 2020-12-31 DIAGNOSIS — E669 Obesity, unspecified: Secondary | ICD-10-CM | POA: Diagnosis not present

## 2021-01-04 DIAGNOSIS — J9602 Acute respiratory failure with hypercapnia: Secondary | ICD-10-CM | POA: Diagnosis not present

## 2021-01-04 DIAGNOSIS — R262 Difficulty in walking, not elsewhere classified: Secondary | ICD-10-CM | POA: Diagnosis not present

## 2021-01-04 DIAGNOSIS — R0602 Shortness of breath: Secondary | ICD-10-CM | POA: Diagnosis not present

## 2021-01-04 DIAGNOSIS — J441 Chronic obstructive pulmonary disease with (acute) exacerbation: Secondary | ICD-10-CM | POA: Diagnosis not present

## 2021-01-07 DIAGNOSIS — M25531 Pain in right wrist: Secondary | ICD-10-CM | POA: Diagnosis not present

## 2021-01-07 DIAGNOSIS — W19XXXA Unspecified fall, initial encounter: Secondary | ICD-10-CM | POA: Diagnosis not present

## 2021-01-07 DIAGNOSIS — R2689 Other abnormalities of gait and mobility: Secondary | ICD-10-CM | POA: Diagnosis not present

## 2021-01-11 DIAGNOSIS — S0990XA Unspecified injury of head, initial encounter: Secondary | ICD-10-CM | POA: Diagnosis not present

## 2021-01-11 DIAGNOSIS — E039 Hypothyroidism, unspecified: Secondary | ICD-10-CM | POA: Diagnosis not present

## 2021-01-11 DIAGNOSIS — S0181XA Laceration without foreign body of other part of head, initial encounter: Secondary | ICD-10-CM | POA: Diagnosis not present

## 2021-01-11 DIAGNOSIS — W19XXXA Unspecified fall, initial encounter: Secondary | ICD-10-CM | POA: Diagnosis not present

## 2021-01-11 DIAGNOSIS — S199XXA Unspecified injury of neck, initial encounter: Secondary | ICD-10-CM | POA: Diagnosis not present

## 2021-01-11 DIAGNOSIS — R58 Hemorrhage, not elsewhere classified: Secondary | ICD-10-CM | POA: Diagnosis not present

## 2021-01-11 DIAGNOSIS — K219 Gastro-esophageal reflux disease without esophagitis: Secondary | ICD-10-CM | POA: Diagnosis not present

## 2021-01-11 DIAGNOSIS — S99001A Unspecified physeal fracture of right calcaneus, initial encounter for closed fracture: Secondary | ICD-10-CM | POA: Diagnosis not present

## 2021-01-11 DIAGNOSIS — F0390 Unspecified dementia without behavioral disturbance: Secondary | ICD-10-CM | POA: Diagnosis not present

## 2021-01-11 DIAGNOSIS — S0101XA Laceration without foreign body of scalp, initial encounter: Secondary | ICD-10-CM | POA: Diagnosis not present

## 2021-01-11 DIAGNOSIS — E119 Type 2 diabetes mellitus without complications: Secondary | ICD-10-CM | POA: Diagnosis not present

## 2021-01-11 DIAGNOSIS — W1839XA Other fall on same level, initial encounter: Secondary | ICD-10-CM | POA: Diagnosis not present

## 2021-01-11 DIAGNOSIS — E785 Hyperlipidemia, unspecified: Secondary | ICD-10-CM | POA: Diagnosis not present

## 2021-01-11 DIAGNOSIS — I10 Essential (primary) hypertension: Secondary | ICD-10-CM | POA: Diagnosis not present

## 2021-01-11 DIAGNOSIS — R279 Unspecified lack of coordination: Secondary | ICD-10-CM | POA: Diagnosis not present

## 2021-01-11 DIAGNOSIS — Z23 Encounter for immunization: Secondary | ICD-10-CM | POA: Diagnosis not present

## 2021-01-11 DIAGNOSIS — R079 Chest pain, unspecified: Secondary | ICD-10-CM | POA: Diagnosis not present

## 2021-01-12 DIAGNOSIS — F1010 Alcohol abuse, uncomplicated: Secondary | ICD-10-CM | POA: Diagnosis not present

## 2021-01-12 DIAGNOSIS — F339 Major depressive disorder, recurrent, unspecified: Secondary | ICD-10-CM | POA: Diagnosis not present

## 2021-01-12 DIAGNOSIS — F3130 Bipolar disorder, current episode depressed, mild or moderate severity, unspecified: Secondary | ICD-10-CM | POA: Diagnosis not present

## 2021-01-12 DIAGNOSIS — F0150 Vascular dementia without behavioral disturbance: Secondary | ICD-10-CM | POA: Diagnosis not present

## 2021-01-14 DIAGNOSIS — W19XXXA Unspecified fall, initial encounter: Secondary | ICD-10-CM | POA: Diagnosis not present

## 2021-01-14 DIAGNOSIS — S0101XA Laceration without foreign body of scalp, initial encounter: Secondary | ICD-10-CM | POA: Diagnosis not present

## 2021-01-14 DIAGNOSIS — S0990XA Unspecified injury of head, initial encounter: Secondary | ICD-10-CM | POA: Diagnosis not present

## 2021-01-14 DIAGNOSIS — R296 Repeated falls: Secondary | ICD-10-CM | POA: Diagnosis not present

## 2021-01-19 DIAGNOSIS — E119 Type 2 diabetes mellitus without complications: Secondary | ICD-10-CM | POA: Diagnosis not present

## 2021-01-19 DIAGNOSIS — E1159 Type 2 diabetes mellitus with other circulatory complications: Secondary | ICD-10-CM | POA: Diagnosis not present

## 2021-01-20 DIAGNOSIS — R296 Repeated falls: Secondary | ICD-10-CM | POA: Diagnosis not present

## 2021-01-20 DIAGNOSIS — S0101XA Laceration without foreign body of scalp, initial encounter: Secondary | ICD-10-CM | POA: Diagnosis not present

## 2021-01-20 DIAGNOSIS — D649 Anemia, unspecified: Secondary | ICD-10-CM | POA: Diagnosis not present

## 2021-01-20 DIAGNOSIS — E875 Hyperkalemia: Secondary | ICD-10-CM | POA: Diagnosis not present

## 2021-01-25 DIAGNOSIS — R0902 Hypoxemia: Secondary | ICD-10-CM | POA: Diagnosis not present

## 2021-01-25 DIAGNOSIS — Z7984 Long term (current) use of oral hypoglycemic drugs: Secondary | ICD-10-CM | POA: Diagnosis not present

## 2021-01-25 DIAGNOSIS — E119 Type 2 diabetes mellitus without complications: Secondary | ICD-10-CM | POA: Diagnosis not present

## 2021-01-25 DIAGNOSIS — S299XXA Unspecified injury of thorax, initial encounter: Secondary | ICD-10-CM | POA: Diagnosis not present

## 2021-01-25 DIAGNOSIS — S40022A Contusion of left upper arm, initial encounter: Secondary | ICD-10-CM | POA: Diagnosis not present

## 2021-01-25 DIAGNOSIS — I10 Essential (primary) hypertension: Secondary | ICD-10-CM | POA: Diagnosis not present

## 2021-01-25 DIAGNOSIS — S40021A Contusion of right upper arm, initial encounter: Secondary | ICD-10-CM | POA: Diagnosis not present

## 2021-01-25 DIAGNOSIS — S199XXA Unspecified injury of neck, initial encounter: Secondary | ICD-10-CM | POA: Diagnosis not present

## 2021-01-25 DIAGNOSIS — Z87891 Personal history of nicotine dependence: Secondary | ICD-10-CM | POA: Diagnosis not present

## 2021-01-25 DIAGNOSIS — R7989 Other specified abnormal findings of blood chemistry: Secondary | ICD-10-CM | POA: Diagnosis not present

## 2021-01-25 DIAGNOSIS — W1830XA Fall on same level, unspecified, initial encounter: Secondary | ICD-10-CM | POA: Diagnosis not present

## 2021-01-25 DIAGNOSIS — R778 Other specified abnormalities of plasma proteins: Secondary | ICD-10-CM | POA: Diagnosis not present

## 2021-01-25 DIAGNOSIS — J449 Chronic obstructive pulmonary disease, unspecified: Secondary | ICD-10-CM | POA: Diagnosis not present

## 2021-01-25 DIAGNOSIS — M79602 Pain in left arm: Secondary | ICD-10-CM | POA: Diagnosis not present

## 2021-01-25 DIAGNOSIS — E785 Hyperlipidemia, unspecified: Secondary | ICD-10-CM | POA: Diagnosis not present

## 2021-01-25 DIAGNOSIS — R748 Abnormal levels of other serum enzymes: Secondary | ICD-10-CM | POA: Diagnosis not present

## 2021-01-25 DIAGNOSIS — S0990XA Unspecified injury of head, initial encounter: Secondary | ICD-10-CM | POA: Diagnosis not present

## 2021-01-25 DIAGNOSIS — Z79899 Other long term (current) drug therapy: Secondary | ICD-10-CM | POA: Diagnosis not present

## 2021-01-25 DIAGNOSIS — W19XXXA Unspecified fall, initial encounter: Secondary | ICD-10-CM | POA: Diagnosis not present

## 2021-01-26 DIAGNOSIS — I499 Cardiac arrhythmia, unspecified: Secondary | ICD-10-CM | POA: Diagnosis not present

## 2021-01-26 DIAGNOSIS — F339 Major depressive disorder, recurrent, unspecified: Secondary | ICD-10-CM | POA: Diagnosis not present

## 2021-01-26 DIAGNOSIS — J441 Chronic obstructive pulmonary disease with (acute) exacerbation: Secondary | ICD-10-CM | POA: Diagnosis not present

## 2021-01-26 DIAGNOSIS — E119 Type 2 diabetes mellitus without complications: Secondary | ICD-10-CM | POA: Diagnosis not present

## 2021-01-26 DIAGNOSIS — R0602 Shortness of breath: Secondary | ICD-10-CM | POA: Diagnosis not present

## 2021-01-26 DIAGNOSIS — E785 Hyperlipidemia, unspecified: Secondary | ICD-10-CM | POA: Diagnosis not present

## 2021-01-26 DIAGNOSIS — F3130 Bipolar disorder, current episode depressed, mild or moderate severity, unspecified: Secondary | ICD-10-CM | POA: Diagnosis not present

## 2021-01-26 DIAGNOSIS — R634 Abnormal weight loss: Secondary | ICD-10-CM | POA: Diagnosis not present

## 2021-01-26 DIAGNOSIS — J80 Acute respiratory distress syndrome: Secondary | ICD-10-CM | POA: Diagnosis not present

## 2021-01-26 DIAGNOSIS — R069 Unspecified abnormalities of breathing: Secondary | ICD-10-CM | POA: Diagnosis not present

## 2021-01-26 DIAGNOSIS — Z79899 Other long term (current) drug therapy: Secondary | ICD-10-CM | POA: Diagnosis not present

## 2021-01-26 DIAGNOSIS — F1010 Alcohol abuse, uncomplicated: Secondary | ICD-10-CM | POA: Diagnosis not present

## 2021-01-26 DIAGNOSIS — R0902 Hypoxemia: Secondary | ICD-10-CM | POA: Diagnosis not present

## 2021-01-26 DIAGNOSIS — R262 Difficulty in walking, not elsewhere classified: Secondary | ICD-10-CM | POA: Diagnosis not present

## 2021-01-26 DIAGNOSIS — R0689 Other abnormalities of breathing: Secondary | ICD-10-CM | POA: Diagnosis not present

## 2021-01-26 DIAGNOSIS — Z87891 Personal history of nicotine dependence: Secondary | ICD-10-CM | POA: Diagnosis not present

## 2021-01-26 DIAGNOSIS — J9602 Acute respiratory failure with hypercapnia: Secondary | ICD-10-CM | POA: Diagnosis not present

## 2021-01-26 DIAGNOSIS — Z20822 Contact with and (suspected) exposure to covid-19: Secondary | ICD-10-CM | POA: Diagnosis not present

## 2021-01-26 DIAGNOSIS — J449 Chronic obstructive pulmonary disease, unspecified: Secondary | ICD-10-CM | POA: Diagnosis not present

## 2021-01-26 DIAGNOSIS — I10 Essential (primary) hypertension: Secondary | ICD-10-CM | POA: Diagnosis not present

## 2021-01-28 DIAGNOSIS — R296 Repeated falls: Secondary | ICD-10-CM | POA: Diagnosis not present

## 2021-01-28 DIAGNOSIS — R54 Age-related physical debility: Secondary | ICD-10-CM | POA: Diagnosis not present

## 2021-01-28 DIAGNOSIS — I10 Essential (primary) hypertension: Secondary | ICD-10-CM | POA: Diagnosis not present

## 2021-01-28 DIAGNOSIS — R233 Spontaneous ecchymoses: Secondary | ICD-10-CM | POA: Diagnosis not present

## 2021-01-30 DIAGNOSIS — E782 Mixed hyperlipidemia: Secondary | ICD-10-CM | POA: Diagnosis not present

## 2021-01-30 DIAGNOSIS — I10 Essential (primary) hypertension: Secondary | ICD-10-CM | POA: Diagnosis not present

## 2021-01-30 DIAGNOSIS — E559 Vitamin D deficiency, unspecified: Secondary | ICD-10-CM | POA: Diagnosis not present

## 2021-01-30 DIAGNOSIS — Z79899 Other long term (current) drug therapy: Secondary | ICD-10-CM | POA: Diagnosis not present

## 2021-02-03 DIAGNOSIS — J9602 Acute respiratory failure with hypercapnia: Secondary | ICD-10-CM | POA: Diagnosis not present

## 2021-02-03 DIAGNOSIS — R0602 Shortness of breath: Secondary | ICD-10-CM | POA: Diagnosis not present

## 2021-02-03 DIAGNOSIS — J441 Chronic obstructive pulmonary disease with (acute) exacerbation: Secondary | ICD-10-CM | POA: Diagnosis not present

## 2021-02-03 DIAGNOSIS — R262 Difficulty in walking, not elsewhere classified: Secondary | ICD-10-CM | POA: Diagnosis not present

## 2021-02-09 DIAGNOSIS — F1010 Alcohol abuse, uncomplicated: Secondary | ICD-10-CM | POA: Diagnosis not present

## 2021-02-09 DIAGNOSIS — F339 Major depressive disorder, recurrent, unspecified: Secondary | ICD-10-CM | POA: Diagnosis not present

## 2021-02-09 DIAGNOSIS — R634 Abnormal weight loss: Secondary | ICD-10-CM | POA: Diagnosis not present

## 2021-02-09 DIAGNOSIS — F3130 Bipolar disorder, current episode depressed, mild or moderate severity, unspecified: Secondary | ICD-10-CM | POA: Diagnosis not present

## 2021-02-18 DIAGNOSIS — I10 Essential (primary) hypertension: Secondary | ICD-10-CM | POA: Diagnosis not present

## 2021-02-18 DIAGNOSIS — R2689 Other abnormalities of gait and mobility: Secondary | ICD-10-CM | POA: Diagnosis not present

## 2021-02-18 DIAGNOSIS — R296 Repeated falls: Secondary | ICD-10-CM | POA: Diagnosis not present

## 2021-02-18 DIAGNOSIS — W19XXXA Unspecified fall, initial encounter: Secondary | ICD-10-CM | POA: Diagnosis not present

## 2021-02-26 DIAGNOSIS — R262 Difficulty in walking, not elsewhere classified: Secondary | ICD-10-CM | POA: Diagnosis not present

## 2021-02-26 DIAGNOSIS — J9602 Acute respiratory failure with hypercapnia: Secondary | ICD-10-CM | POA: Diagnosis not present

## 2021-02-26 DIAGNOSIS — J441 Chronic obstructive pulmonary disease with (acute) exacerbation: Secondary | ICD-10-CM | POA: Diagnosis not present

## 2021-02-26 DIAGNOSIS — R0602 Shortness of breath: Secondary | ICD-10-CM | POA: Diagnosis not present

## 2021-03-06 DIAGNOSIS — R0602 Shortness of breath: Secondary | ICD-10-CM | POA: Diagnosis not present

## 2021-03-06 DIAGNOSIS — J441 Chronic obstructive pulmonary disease with (acute) exacerbation: Secondary | ICD-10-CM | POA: Diagnosis not present

## 2021-03-06 DIAGNOSIS — R262 Difficulty in walking, not elsewhere classified: Secondary | ICD-10-CM | POA: Diagnosis not present

## 2021-03-06 DIAGNOSIS — J9602 Acute respiratory failure with hypercapnia: Secondary | ICD-10-CM | POA: Diagnosis not present

## 2021-03-11 DIAGNOSIS — S51801A Unspecified open wound of right forearm, initial encounter: Secondary | ICD-10-CM | POA: Diagnosis not present

## 2021-03-11 DIAGNOSIS — I959 Hypotension, unspecified: Secondary | ICD-10-CM | POA: Diagnosis not present

## 2021-03-11 DIAGNOSIS — R296 Repeated falls: Secondary | ICD-10-CM | POA: Diagnosis not present

## 2021-03-11 DIAGNOSIS — R197 Diarrhea, unspecified: Secondary | ICD-10-CM | POA: Diagnosis not present

## 2021-03-14 DIAGNOSIS — I10 Essential (primary) hypertension: Secondary | ICD-10-CM | POA: Diagnosis not present

## 2021-03-14 DIAGNOSIS — E119 Type 2 diabetes mellitus without complications: Secondary | ICD-10-CM | POA: Diagnosis not present

## 2021-03-29 DIAGNOSIS — D649 Anemia, unspecified: Secondary | ICD-10-CM | POA: Diagnosis not present

## 2021-03-29 DIAGNOSIS — E039 Hypothyroidism, unspecified: Secondary | ICD-10-CM | POA: Diagnosis not present

## 2021-04-05 DIAGNOSIS — J441 Chronic obstructive pulmonary disease with (acute) exacerbation: Secondary | ICD-10-CM | POA: Diagnosis not present

## 2021-04-05 DIAGNOSIS — R0602 Shortness of breath: Secondary | ICD-10-CM | POA: Diagnosis not present

## 2021-04-05 DIAGNOSIS — R262 Difficulty in walking, not elsewhere classified: Secondary | ICD-10-CM | POA: Diagnosis not present

## 2021-04-05 DIAGNOSIS — J9602 Acute respiratory failure with hypercapnia: Secondary | ICD-10-CM | POA: Diagnosis not present
